# Patient Record
Sex: Male | Born: 1935 | Race: White | Hispanic: No | Marital: Married | Smoking: Never smoker
Health system: Southern US, Community
[De-identification: ages and names within clinical notes are randomized; demographics above are authoritative.]

## PROBLEM LIST (undated history)

## (undated) DIAGNOSIS — F1010 Alcohol abuse, uncomplicated: Secondary | ICD-10-CM

## (undated) DIAGNOSIS — I2510 Atherosclerotic heart disease of native coronary artery without angina pectoris: Secondary | ICD-10-CM

## (undated) DIAGNOSIS — I429 Cardiomyopathy, unspecified: Secondary | ICD-10-CM

## (undated) DIAGNOSIS — E119 Type 2 diabetes mellitus without complications: Secondary | ICD-10-CM

## (undated) DIAGNOSIS — R569 Unspecified convulsions: Secondary | ICD-10-CM

## (undated) DIAGNOSIS — I509 Heart failure, unspecified: Secondary | ICD-10-CM

## (undated) DIAGNOSIS — K5900 Constipation, unspecified: Secondary | ICD-10-CM

## (undated) DIAGNOSIS — R601 Generalized edema: Principal | ICD-10-CM

## (undated) DIAGNOSIS — E785 Hyperlipidemia, unspecified: Secondary | ICD-10-CM

## (undated) DIAGNOSIS — T4145XA Adverse effect of unspecified anesthetic, initial encounter: Secondary | ICD-10-CM

## (undated) DIAGNOSIS — R32 Unspecified urinary incontinence: Secondary | ICD-10-CM

## (undated) DIAGNOSIS — G629 Polyneuropathy, unspecified: Secondary | ICD-10-CM

## (undated) DIAGNOSIS — T8859XA Other complications of anesthesia, initial encounter: Secondary | ICD-10-CM

## (undated) DIAGNOSIS — C801 Malignant (primary) neoplasm, unspecified: Secondary | ICD-10-CM

## (undated) DIAGNOSIS — Z8719 Personal history of other diseases of the digestive system: Secondary | ICD-10-CM

## (undated) DIAGNOSIS — I10 Essential (primary) hypertension: Secondary | ICD-10-CM

## (undated) HISTORY — PX: CORONARY STENT PLACEMENT: SHX1402

## (undated) HISTORY — PX: TONSILLECTOMY: SUR1361

## (undated) HISTORY — PX: CATARACT EXTRACTION, BILATERAL: SHX1313

## (undated) HISTORY — PX: CORONARY ANGIOPLASTY: SHX604

## (undated) HISTORY — PX: RADIOACTIVE SEED IMPLANT: SHX5150

---
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
## 2003-11-21 ENCOUNTER — Ambulatory Visit (HOSPITAL_COMMUNITY): Admission: RE | Admit: 2003-11-21 | Discharge: 2003-11-21 | Payer: Self-pay | Admitting: *Deleted

## 2003-11-23 ENCOUNTER — Encounter: Payer: Self-pay | Admitting: Cardiology

## 2003-11-23 ENCOUNTER — Inpatient Hospital Stay (HOSPITAL_COMMUNITY): Admission: EM | Admit: 2003-11-23 | Discharge: 2003-11-30 | Payer: Self-pay | Admitting: Emergency Medicine

## 2003-11-30 ENCOUNTER — Ambulatory Visit: Payer: Self-pay | Admitting: Physical Medicine & Rehabilitation

## 2004-01-29 ENCOUNTER — Inpatient Hospital Stay (HOSPITAL_COMMUNITY): Admission: EM | Admit: 2004-01-29 | Discharge: 2004-02-08 | Payer: Self-pay | Admitting: Emergency Medicine

## 2004-01-29 ENCOUNTER — Ambulatory Visit: Payer: Self-pay | Admitting: Critical Care Medicine

## 2004-09-11 ENCOUNTER — Encounter: Admission: RE | Admit: 2004-09-11 | Discharge: 2004-09-11 | Payer: Self-pay | Admitting: Internal Medicine

## 2004-09-15 ENCOUNTER — Emergency Department (HOSPITAL_COMMUNITY): Admission: EM | Admit: 2004-09-15 | Discharge: 2004-09-15 | Payer: Self-pay | Admitting: Emergency Medicine

## 2005-10-20 ENCOUNTER — Ambulatory Visit: Admission: RE | Admit: 2005-10-20 | Discharge: 2005-11-11 | Payer: Self-pay | Admitting: Radiation Oncology

## 2006-03-01 ENCOUNTER — Ambulatory Visit: Admission: RE | Admit: 2006-03-01 | Discharge: 2006-05-16 | Payer: Self-pay | Admitting: Radiation Oncology

## 2006-03-22 ENCOUNTER — Encounter: Admission: RE | Admit: 2006-03-22 | Discharge: 2006-03-22 | Payer: Self-pay | Admitting: Urology

## 2006-03-23 HISTORY — PX: PROSTATE SURGERY: SHX751

## 2006-03-29 ENCOUNTER — Ambulatory Visit (HOSPITAL_BASED_OUTPATIENT_CLINIC_OR_DEPARTMENT_OTHER): Admission: RE | Admit: 2006-03-29 | Discharge: 2006-03-29 | Payer: Self-pay | Admitting: Urology

## 2006-03-29 ENCOUNTER — Encounter (INDEPENDENT_AMBULATORY_CARE_PROVIDER_SITE_OTHER): Payer: Self-pay | Admitting: *Deleted

## 2006-08-10 ENCOUNTER — Encounter: Admission: RE | Admit: 2006-08-10 | Discharge: 2006-08-10 | Payer: Self-pay | Admitting: Internal Medicine

## 2007-08-31 ENCOUNTER — Emergency Department (HOSPITAL_COMMUNITY): Admission: EM | Admit: 2007-08-31 | Discharge: 2007-08-31 | Payer: Self-pay | Admitting: Emergency Medicine

## 2007-09-13 ENCOUNTER — Emergency Department (HOSPITAL_BASED_OUTPATIENT_CLINIC_OR_DEPARTMENT_OTHER): Admission: EM | Admit: 2007-09-13 | Discharge: 2007-09-13 | Payer: Self-pay | Admitting: Emergency Medicine

## 2007-09-16 ENCOUNTER — Emergency Department (HOSPITAL_BASED_OUTPATIENT_CLINIC_OR_DEPARTMENT_OTHER): Admission: EM | Admit: 2007-09-16 | Discharge: 2007-09-16 | Payer: Self-pay | Admitting: Emergency Medicine

## 2009-08-26 ENCOUNTER — Inpatient Hospital Stay (HOSPITAL_COMMUNITY): Admission: RE | Admit: 2009-08-26 | Discharge: 2009-08-28 | Payer: Self-pay | Admitting: Ophthalmology

## 2009-09-02 ENCOUNTER — Ambulatory Visit (HOSPITAL_COMMUNITY): Admission: RE | Admit: 2009-09-02 | Discharge: 2009-09-03 | Payer: Self-pay | Admitting: Ophthalmology

## 2010-02-21 ENCOUNTER — Emergency Department (HOSPITAL_BASED_OUTPATIENT_CLINIC_OR_DEPARTMENT_OTHER): Admission: EM | Admit: 2010-02-21 | Discharge: 2010-02-21 | Payer: Self-pay | Admitting: Emergency Medicine

## 2010-03-01 ENCOUNTER — Emergency Department (HOSPITAL_BASED_OUTPATIENT_CLINIC_OR_DEPARTMENT_OTHER)
Admission: EM | Admit: 2010-03-01 | Discharge: 2010-03-01 | Payer: Self-pay | Source: Home / Self Care | Admitting: Emergency Medicine

## 2010-04-05 ENCOUNTER — Emergency Department (HOSPITAL_BASED_OUTPATIENT_CLINIC_OR_DEPARTMENT_OTHER)
Admission: EM | Admit: 2010-04-05 | Discharge: 2010-04-05 | Payer: Self-pay | Source: Home / Self Care | Admitting: Emergency Medicine

## 2010-04-07 LAB — URINE MICROSCOPIC-ADD ON

## 2010-04-07 LAB — URINALYSIS, ROUTINE W REFLEX MICROSCOPIC
Ketones, ur: 15 mg/dL — AB
Nitrite: POSITIVE — AB
Protein, ur: 100 mg/dL — AB
Specific Gravity, Urine: 1.012 (ref 1.005–1.030)
Urine Glucose, Fasting: NEGATIVE mg/dL
Urobilinogen, UA: 1 mg/dL (ref 0.0–1.0)
pH: 6.5 (ref 5.0–8.0)

## 2010-04-17 LAB — URINE CULTURE
Colony Count: >=100000
Culture  Setup Time: 201201150049

## 2010-06-03 LAB — URINE CULTURE
Colony Count: NO GROWTH
Culture  Setup Time: 201112030321
Culture  Setup Time: 201112102041
Culture: NO GROWTH

## 2010-06-03 LAB — URINE MICROSCOPIC-ADD ON

## 2010-06-03 LAB — URINALYSIS, ROUTINE W REFLEX MICROSCOPIC
Glucose, UA: NEGATIVE mg/dL
Glucose, UA: NEGATIVE mg/dL
Ketones, ur: 15 mg/dL — AB
Ketones, ur: NEGATIVE mg/dL
Protein, ur: 100 mg/dL — AB
Protein, ur: 30 mg/dL — AB
Specific Gravity, Urine: 1.012 (ref 1.005–1.030)
Urobilinogen, UA: 0.2 mg/dL (ref 0.0–1.0)
pH: 5.5 (ref 5.0–8.0)
pH: 5.5 (ref 5.0–8.0)

## 2010-06-09 LAB — COMPREHENSIVE METABOLIC PANEL
AST: 52 U/L — ABNORMAL HIGH (ref 0–37)
Albumin: 3 g/dL — ABNORMAL LOW (ref 3.5–5.2)
BUN: 19 mg/dL (ref 6–23)
BUN: 20 mg/dL (ref 6–23)
CO2: 23 mEq/L (ref 19–32)
Calcium: 9 mg/dL (ref 8.4–10.5)
Creatinine, Ser: 1.1 mg/dL (ref 0.4–1.5)
Creatinine, Ser: 1.14 mg/dL (ref 0.4–1.5)
GFR calc Af Amer: >60 mL/min (ref >60)
GFR calc non Af Amer: >60 mL/min (ref >60)
Glucose, Bld: 149 mg/dL — ABNORMAL HIGH (ref 70–99)
Glucose, Bld: 181 mg/dL — ABNORMAL HIGH (ref 70–99)
Total Bilirubin: 1.3 mg/dL — ABNORMAL HIGH (ref 0.3–1.2)
Total Protein: 6.5 g/dL (ref 6.0–8.3)

## 2010-06-09 LAB — CBC
HCT: 34.3 % — ABNORMAL LOW (ref 39.0–52.0)
HCT: 37.8 % — ABNORMAL LOW (ref 39.0–52.0)
HCT: 40 % (ref 39.0–52.0)
Hemoglobin: 12.2 g/dL — ABNORMAL LOW (ref 13.0–17.0)
Hemoglobin: 13.9 g/dL (ref 13.0–17.0)
MCHC: 34.7 g/dL (ref 30.0–36.0)
MCHC: 35.5 g/dL (ref 30.0–36.0)
MCV: 101.6 fL — ABNORMAL HIGH (ref 78.0–100.0)
MCV: 101.8 fL — ABNORMAL HIGH (ref 78.0–100.0)
Platelets: 126 10*3/uL — ABNORMAL LOW (ref 150–400)
Platelets: 155 10*3/uL (ref 150–400)
RBC: 3.72 MIL/uL — ABNORMAL LOW (ref 4.22–5.81)
RBC: 3.91 MIL/uL — ABNORMAL LOW (ref 4.22–5.81)
RDW: 12.5 % (ref 11.5–15.5)
WBC: 9.8 10*3/uL (ref 4.0–10.5)

## 2010-06-09 LAB — HEPATIC FUNCTION PANEL
ALT: 40 U/L (ref 0–53)
ALT: 43 U/L (ref 0–53)
AST: 49 U/L — ABNORMAL HIGH (ref 0–37)
AST: 63 U/L — ABNORMAL HIGH (ref 0–37)
Albumin: 3.5 g/dL (ref 3.5–5.2)
Alkaline Phosphatase: 77 U/L (ref 39–117)
Bilirubin, Direct: 0.3 mg/dL (ref 0.0–0.3)
Indirect Bilirubin: 0.8 mg/dL (ref 0.3–0.9)
Total Bilirubin: 1 mg/dL (ref 0.3–1.2)
Total Protein: 7.6 g/dL (ref 6.0–8.3)

## 2010-06-09 LAB — GLUCOSE, CAPILLARY
Glucose-Capillary: 122 mg/dL — ABNORMAL HIGH (ref 70–99)
Glucose-Capillary: 152 mg/dL — ABNORMAL HIGH (ref 70–99)
Glucose-Capillary: 156 mg/dL — ABNORMAL HIGH (ref 70–99)
Glucose-Capillary: 161 mg/dL — ABNORMAL HIGH (ref 70–99)
Glucose-Capillary: 162 mg/dL — ABNORMAL HIGH (ref 70–99)
Glucose-Capillary: 226 mg/dL — ABNORMAL HIGH (ref 70–99)
Glucose-Capillary: 285 mg/dL — ABNORMAL HIGH (ref 70–99)
Glucose-Capillary: 304 mg/dL — ABNORMAL HIGH (ref 70–99)

## 2010-06-09 LAB — BASIC METABOLIC PANEL
BUN: 16 mg/dL (ref 6–23)
BUN: 19 mg/dL (ref 6–23)
CO2: 26 mEq/L (ref 19–32)
Calcium: 9.4 mg/dL (ref 8.4–10.5)
Calcium: 9.8 mg/dL (ref 8.4–10.5)
Chloride: 103 mEq/L (ref 96–112)
Creatinine, Ser: 1.09 mg/dL (ref 0.4–1.5)
Creatinine, Ser: 1.28 mg/dL (ref 0.4–1.5)
GFR calc Af Amer: >60 mL/min (ref >60)
GFR calc Af Amer: >60 mL/min (ref >60)
GFR calc non Af Amer: 55 mL/min — ABNORMAL LOW (ref >60)
GFR calc non Af Amer: >60 mL/min (ref >60)
Glucose, Bld: 167 mg/dL — ABNORMAL HIGH (ref 70–99)
Potassium: 4.6 mEq/L (ref 3.5–5.1)
Sodium: 135 mEq/L (ref 135–145)

## 2010-06-09 LAB — DIFFERENTIAL
Basophils Absolute: 0 10*3/uL (ref 0.0–0.1)
Eosinophils Relative: 11 % — ABNORMAL HIGH (ref 0–5)
Lymphocytes Relative: 27 % (ref 12–46)
Lymphs Abs: 2.6 10*3/uL (ref 0.7–4.0)
Monocytes Absolute: 1.3 10*3/uL — ABNORMAL HIGH (ref 0.1–1.0)
Monocytes Relative: 14 % — ABNORMAL HIGH (ref 3–12)

## 2010-06-09 LAB — CARDIAC PANEL(CRET KIN+CKTOT+MB+TROPI)
Relative Index: INVALID (ref 0.0–2.5)
Troponin I: 0.06 ng/mL (ref 0.00–0.06)

## 2010-06-09 LAB — MAGNESIUM: Magnesium: 1.6 mg/dL (ref 1.5–2.5)

## 2010-06-09 LAB — PHOSPHORUS: Phosphorus: 3.4 mg/dL (ref 2.3–4.6)

## 2010-06-26 ENCOUNTER — Other Ambulatory Visit: Payer: Self-pay | Admitting: Dermatology

## 2010-08-08 NOTE — H&P (Signed)
NAME:  Daniel [name], Daniel [name]                        ACCOUNT NO.:  [id number]   MEDICAL RECORD NO.:  [id number]                   PATIENT TYPE:  INP   LOCATION:  0101                                 FACILITY:  [facility]   PHYSICIAN:  Mobolaji B. Maia Petties, M.D.            DATE OF BIRTH:  Feb 25, 1934   DATE OF ADMISSION:  11/23/2003  DATE OF DISCHARGE:                                HISTORY & PHYSICAL   PRIMARY CARE PHYSICIAN:  Dr. Tommy Medal.   CHIEF COMPLAINT:  Unsteady on his feet yesterday, fall with bruise over  right side of the face.   HISTORY OF THE PRESENTING COMPLAINT:  Mr. Godman is a 77 year old white male  with history of CAD, status post stents in 2004; history of diabetes,  hypertension, hypercholesterolemia.  He has significant history of alcohol  abuse.  He is now presenting with unsteady on his feet, which he noticed  yesterday afternoon/evening.  The patient went out to have supper at a  restaurant and in the car park, he became very tremulous and unsteady; he  subsequently fell and sustained injury to the right side of his face.  He  was taken to quick care and there he had a CT scan of the brain which did  not show any fracture or any intracranial bleed.  He did not lose  consciousness.  He went home and became more unsteady and tremulous and wife  was uncomfortable having him at home, hence he was brought back tonight to  [facility] for further evaluation.  A repeat head CT scan was done, just to  rule out any late bleed, and there was no intracranial abnormality, except  for atrophy.  The patient was unable to walk because he was extremely  unsteady.   On further questioning, Mr. Ralston drinks about 1 bottle of vodka in 2 days  at home,_________ drink more, however, in addition to this, he goes to a  nearby bar to drink.  Yesterday, he did not go to the bar to drink and there  was insufficient vodka at home, so he drank less than what he usually does  drink.  He denies  any chest pain, diaphoresis or palpitations.  He does have  nausea and vomiting; he vomited 3 times yesterday.  No hematemesis, no  melenic stool and no abdominal pain, no diarrhea.   REVIEW OF SYSTEMS:  No fever.  No headaches.  Pain on the right side of the  face.  No urinary symptoms.   PAST MEDICAL HISTORY:  1.  CAD, status post 2 stent placements in October 2004.  2.  Diabetes mellitus, type 2.  3.  Hypertension.  4.  Hypercholesterolemia.  5.  Grand mal seizure which required ICU admission and possible intubation      at [facility]; these were secondary to alcohol withdrawal.  6.  Patient had syncopal episode about 6 weeks ago.  His blood glucose was  at that time 26.  He did not come to the emergency department, however,      EMS attended to him at home and they were able to bring up his blood      glucose to 260, and this was deemed to be due to poor p.o. intake.  7.  Depression.   MEDICATIONS:  1.  Cephalexin 500 mg p.o. t.i.d.; this was prescribed yesterday for the      bruise and abrasion on the right side of the face.  2.  Lipitor 40 mg p.o. daily.  3.  Lexapro 10 mg p.o. daily.  4.  Plavix 75 mg p.o. daily.  5.  Amaryl 2 mg p.o. daily.  6.  Toprol-XL 100 mg p.o. daily.  7.  Avapro 300 mg p.o. daily.   The patient's wife says that the patient does not use all these medications;  he is only using Plavix and Amaryl.   FAMILY HISTORY:  He is married and lives with his wife.  One sister died of  lung cancer in 06-24-2001; another sister died of pancreatic cancer in 24-Jun-2001.  No  family history of asthma.   SOCIAL HISTORY:  The patient does not smoke, however, he drinks heavily,  vodka, 1 bottle in 2 days.  He does not use any illicit drugs.  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  He lives a sedentary lifestyle.   PHYSICAL EXAMINATION:  VITALS IN THE EMERGENCY DEPARTMENT:  Temperature  98.1, blood pressure  188/91, pulse rate 101, respiratory rate 22, O2  saturations 98%.  GENERAL:  On examination, he is awake, alert, oriented in time, place and  person, not in any obvious respiratory distress.  HEENT:  He had bandage over an abrasion on the right supraorbital region  with periorbital hematoma and facial swelling on the right side and bruise.  Mucous membranes moist.  No oral thrush.  Extraocular muscle movements  intact.  No nystagmus.  No ocular paralysis.  NECK:  No neck stiffness, no cervical lymphadenopathy, no thyromegaly.  LUNGS:  Lungs clear clinically to auscultation.  CVS:  S1 and S2 regular.  No gallop.  No murmur.  No rub.  No holosystolic  murmur.  ABDOMEN:  Abdomen obese, soft, nontender.  No palpable organomegaly.  Bowel  sounds present.  Shifting dullness negative.  MUSCULOSKELETAL:  Facial injury as described above, also a bruise and an  abrasion on the right hand and on the right knee.  He is able to move all  limbs.  CNS:  Cranial nerves intact.  No focal neurological deficit.  Plantar  reflex:  Flex on the right and withdrawal on the left.   INITIAL LABORATORY DATA:  White cell count 7.9, hemoglobin 14.5, hematocrit  41.1, platelets 122,000; neutrophils 76%, lymphocytes 10%, monocytes 14%.  Sodium 137, potassium 3.3, chloride 102, bicarb 26, glucose 151, BUN 8,  creatinine 0.7, calcium 8.7.   CT scan of the head:  No intracranial bleed.  Atrophy.  No evidence of  fracture.   ASSESSMENT AND PLAN:  Mr. Easterly is a 77 year old with history of coronary  artery disease and multiple other medical problems, significant history of  alcohol abuse, presenting with ataxia and fall probably secondary to  drinking less than what he usually drinks.   1.  Alcohol withdrawal:  We will check hemoglobin and hematocrit level,      vitamin B12 and folate level, amylase, lipase, magnesium phosphate.  We     will start on alcohol withdrawal protocol with Ativan, seizure       precautions.  We will obtain social services to help with quitting      alcohol and I discussed with the patient in the presence of wife and      son; the patient will think about it.  Wife did say that he had been      advised regarding quitting, however, the patient has refused to so far.      Thiamine 100 mg intravenously/p.o. daily, folic acid 4 mg      intravenously/p.o. daily, intravenous fluid -- D-5 normal saline at 125      mL/hr plus 30 mEq of potassium chloride.  2.  Thrombocytopenia, probably alcohol related, query Plavix related.  No      bleeding at this point.  3.  Coronary artery disease:  This appears to be stable.  The patient has no      complaint.  We will check EKG.  We will continue Plavix 75 mg p.o.      daily, Toprol-XL 100 mg p.o. daily.  4.  Hypokalemia:  We will replete potassium.  5.  Type 2 diabetes mellitus:  Check hemoglobin A1c.  We will hold Amaryl      until the patient resumes adequate oral intake.  Sliding-scale insulin      coverage moderately sensitive for now.  58.  Fall with trauma to face:  No fracture.  We will continue with      ciprofloxacin eye drops to both eyes every 4 hours and Cephalexin 500 mg      t.i.d. p.o.; these were prescribed at the urgent care center.  We will      give morphine 4 mg intravenously q.4 h. p.r.n. for pain for moderate-to-      severe pain and Tylenol 650 mg p.o. q.6 h. p.r.n. for mild pain.  7.  Hypertension:  We will resume Toprol-XL 100 mg p.o. daily and Avapro 300      mg p.o. daily.  8.  Depression:  Lexapro 10 mg p.o. daily.  9.  Hyperlipidemia:  We will check hepatic profile and fasting lipid      profile.  Lipitor 40 mg p.o. daily.  10. We will use TED stockings for deep venous thrombosis prophylaxis.                                               Mobolaji B. Maia Petties, M.D.    MBB/MEDQ  D:  11/23/2003  T:  11/23/2003  Job:  [id number]   cc:   Tommy Medal, M.D.  [address]  [place], [place]  [address]  Fax: [phone]

## 2010-08-08 NOTE — Discharge Summary (Signed)
NAMERAMEEZ, Daniel [name]              ACCOUNT NO.:  [id number]   MEDICAL RECORD NO.:  [id number]          PATIENT TYPE:  INP   LOCATION:  [id number]                         FACILITY:  [facility]   PHYSICIAN:  Jonna L. Gwynneth Aliment, M.D.DATE OF BIRTH:  [birth year]   DATE OF ADMISSION:  11/23/2003  DATE OF DISCHARGE:  11/30/2003                                 DISCHARGE SUMMARY   PRIMARY CARE PHYSICIAN:  Tommy Medal, M.D.   FINAL DIAGNOSES:  1.  Alcohol withdrawal, delirium tremens.  2.  Multiple hematoma from fall.  3.  Alcohol induced thrombocytopenia.  4.  Hypokalemia.  5.  Congestive heart failure.  6.  Coronary artery disease.  7.  Hyperlipidemia.  8.  Type 2 diabetes.  9.  Hypertension.  10. Deconditioning.   HISTORY:  This 77 year old white male with coronary artery disease and  multiple risk factors had a significant history of alcohol abuse. The  patient developed ataxia, tremulousness, fell outside and got multiple  hematomas to the right side of the face.  He was checked out at [facility],  CT scan did not show fracture or bleed so he was treated for the facial  abrasions and went home. At home, he became more unsteady and tremulous and  was brought back. The patient drinks at least a bottle of vodka a day.   PAST MEDICAL HISTORY:  Notable for coronary artery disease, type 2 diabetes,  hypertension, hypercholesterolemia, previous history of grand mal seizures,  ICU admission, intubation secondary to alcohol withdrawal. He had a  hypoglycemic episode six weeks ago.   PHYSICAL EXAMINATION ON ADMISSION:  Notable for blood pressure of 188/91,  pulse of 101.  He had periorbital hematoma and facial swelling on the right  supraorbital area.  No neck stiffness.  No hepatosplenomegaly.   Platelet count was 122, potassium 3.3, glucose 151.  CT of the head showed  atrophy without bleed or fracture.   HOSPITAL COURSE:  For his alcohol withdrawal, he was put Ativan protocol,  seizure  precautions, thiamin, folic acid, potassium replacement. He was  continued on his Plavix and Toprol.  Amaryl was held until the patient  started eating.  EKG showed a left anterior hemiblock.   The patient did well on protocol.  He was observed for DTs and seizures, but  did not have problems with either.  He was found to have a nonischemic  cardiomyopathy. He was restarted on Lexapro for depression.  The patient was  so weak that he was really unable to walk without assistance so he got some  physical therapy. He was not a candidate for subacute care but we looked  into rehabilitation and alcohol rehab. The patient, however, refused both  alcohol rehab services and outpatient physical therapy.   DISPOSITION:  The patient was discharged on Amaryl 2 mg q.d., Avapro 300  q.d., Toprol XL 100 q.d., Plavix 75 q.d., Lexapro 20 q.d., Lipitor 40 q.d.,  centrum, Cipro ophthalmic drops 2 drops in each eye for 3 more days.  Use  Tylenol as needed for pain. Has an appointment to see Dr. Minna Antis on September  20.      JLB/MEDQ  D:  12/20/2003  T:  12/21/2003  Job:  [id number]   cc:   Tommy Medal, M.D.  [address]  [place], [place] [address]  Fax: [phone]

## 2010-08-08 NOTE — Op Note (Signed)
NAMEDMARIO, GANGWER              ACCOUNT NO.:  [id number]   MEDICAL RECORD NO.:  [id number]          PATIENT TYPE:  AMB   LOCATION:  NESC                         FACILITY:  [facility]   PHYSICIAN:  Domingo Pulse, M.D.  DATE OF BIRTH:  March 05, 1934   DATE OF PROCEDURE:  03/29/2006  DATE OF DISCHARGE:                               OPERATIVE REPORT   PREOPERATIVE DIAGNOSIS:  Carcinoma of the prostate.   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   OPERATION PERFORMED:  1. Radioactive seed implantation.  2. Cystoscopy, bladder biopsy with fulguration.   SURGEON:  Domingo Pulse, M.D.   ANESTHESIA:  General.   COMPLICATIONS:  None.   DRAINS:  Foley catheter.   INDICATIONS FOR PROCEDURE:  This 77 year old male is known to have  carcinoma of the prostate.  He has been given appropriate preoperative  evaluation as well as careful discussion of the options available for  treatment.  Options that we discussed include watchful waiting, external  beam radiation therapy, radiation seed implantation, surgery and  cryosurgery.  He has elected to undergo radiation seed implantation.  He  understands the risks and benefits of the procedure, gave full informed  consent for the procedure.   After successful induction of general anesthesia, the patient was placed  in the dorsal lithotomy position and prepped with Betadine and draped in  the usual sterile fashion.  Patient had the ultrasound probe within the  rectum and was positioned by radiation oncology so that he was in  identical position as that obtained in his preoperative planning  sessions.  The patient had a total of 25 needles inserted.  The total  number of seeds was 70.  The total dose delivered was 145 cGy.  These  were I-125 seeds with intraoperative preplanning.  The patient's post  implant image looked good with excellent coverage.  Cystoscopy was then  performed.  Urethra was  visualized in its entirety and found to be  normal.  Beyond the verumontanum there was minimal prostatic  enlargement.  The bladder itself was unremarkable, no tumors or stones  could be seen.  There was one inflammatory-looking lesion.  It appeared  vascular, but because it was irregular in its appearance, we recommended  a biopsy.  A biopsy was performed with a  cold cup biopsy forceps.  The biopsy site was cauterized.  The bladder  was drained, the Foley catheter was inserted.  The patient tolerated the  procedure well and was taken to the recovery room in good condition.  He  will be sent home on Lorcet Plus and Septra and take his Foley catheter  out tomorrow.           ______________________________  Domingo Pulse, M.D.  Electronically Signed     RJE/MEDQ  D:  03/29/2006  T:  03/29/2006  Job:  [id number]   cc:   Rexene Edison, M.D.  Fax: [phone]   Tommy Medal, M.D.  Fax: [place] Little, M.D.  Fax: [phone]

## 2010-08-08 NOTE — H&P (Signed)
[name], Daniel NO.:  [id number]   MEDICAL RECORD NO.:  [id number]          PATIENT TYPE:  INP   LOCATION:  0104                         FACILITY:  [facility]   PHYSICIAN:  Jacquelynn Cree, M.D.   DATE OF BIRTH:  March 10, 1934   DATE OF ADMISSION:  01/29/2004  DATE OF DISCHARGE:                                HISTORY & PHYSICAL   PRIMARY CARE PHYSICIAN:  Dr. Tommy Medal.   CHIEF COMPLAINT:  Weakness.   HISTORY OF PRESENT ILLNESS:  The patient is a 77 year old male with a past  medical history of alcoholism and hospitalization with delirium tremens in  September of 2005, congestive heart failure and coronary artery disease, as  well as type 2 diabetes, who presents with a 24-hour history of weakness and  a syncopal event.  The patient reports that he was in his usual state of  health until 4 days prior to admission, when he presented to his primary  care physician for complaints of nausea and vomiting.  He was treated  symptomatically with antiemetics and did well with resolution of the nausea  and vomiting, however, over the next few days, he experienced labile blood  glucoses with sugars as low as into the 40s.  He reports that when he saw  his physician, there was a question of whether he might have a urinary tract  infection secondary to an elevation of his prostate specific antigen and was  put on a sulfa-based antibiotic at that time.  The patient denied any  dysuria, frequency or other symptoms of urinary tract infection.  The  patient also reports that he found himself on the floor with brief loss of  consciousness after defecating today at about 2:30 p.m.  With regard to his  alcohol abuse, he denies any alcohol intake for the past 7-8 days.  Denies  any loss of bladder or bowel function, focal neurologic deficits, diplopia,  dysphagia, or loss of power.   PAST MEDICAL HISTORY:  1.  Longstanding alcoholism with alcohol withdrawal seizures, status post     hospitalization, September 2005, for delirium tremens and history of      intubation secondary to withdrawal in the past as well.  2.  Alcohol-induced thrombocytopenia.  3.  Coronary artery disease/CHF with stents placed in 2004.  4.  Hyperlipidemia.  5.  Type 2 diabetes, last hemoglobin A1c 5.7 in September of 2005.  6.  Hypertension.  7.  History of nonischemic cardiomyopathy, last 2-D echocardiogram done,      September of 2005, showing a decreased LV function with an ejection      fraction of 35% to 45% and global hypokinesis.  8.  Depression.   FAMILY HISTORY:  Mother is deceased at age 49 secondary to CHF.  Father is  deceased at age 12 secondary to CHF and complications of emphysema.  He has  1 sister deceased from pancreatic cancer and another sister deceased from  lung cancer.  He has 2 son that are healthy.   SOCIAL HISTORY:  He is married.  Up until 1 week ago, he drank 1 bottle  of  vodka every 1-2 days.  He is a life-long nonsmoker.  Denies any drug abuse.  He was previously employed at [employer], but has now retired.   DRUG ALLERGIES:  No known drug allergies.   MEDICATIONS:  1.  Bactrim DS 1 p.o. b.i.d.  2.  Phenergan 25 mg p.r.n.  3.  Lexapro 10 mg daily.  4.  Amaryl 2 mg daily.  5.  Avapro 300 mg daily.  6.  Toprol-XL 100 mg daily.  7.  Lipitor 40 mg daily.  8.  Aspirin daily.   REVIEW OF SYSTEMS:  Several days of diaphoresis with fever and chills.  He  reports approximately a 5-pound weight loss over the past few weeks.  Pertinent negatives include no nausea, vomiting, diarrhea for the last few  days.  No melena or hematochezia.  No dysuria, frequency, or flank pain.  He  has had some increased thirst.  No headaches, except for at his baseline.  No neck stiffness.  No new onset of peripheral edema.  All other systems  reviewed and found to be negative.   PHYSICAL EXAMINATION:  VITAL SIGNS:  Temperature 98.5, pulse 77,  respirations 20, blood  pressure 105/61, O2 saturation 97% on room air.  GENERAL:  Well-developed, well-nourished male in no apparent distress.  NECK:  Supple, no thyromegaly, no lymphadenopathy, no jugular venous  distention.  CHEST:  Lungs diminished at the bases, but otherwise clear.  HEART:  Regular rate, rhythm.  No murmurs, rubs, or gallops.  ABDOMEN:  Soft, nontender, nondistended, with normoactive bowel sounds.  EXTREMITIES:  No clubbing, edema, or cyanosis.  SKIN:  Warm and dry with no rash.  NEUROLOGIC:  The patient is alert and oriented x3.  Pupils are equal, round,  reactive to light and accommodation.  Extraocular movements are intact.  Tongue is midline.  No facial weakness.  Shoulder shrug is equal  bilaterally.  The patient moves all extremities x4 with 5/5 strength, but  does have some generalized weakness.  He is unable to ambulate secondary to  generalized weakness, although no weakness is noted on exam.  Reflexes are  2+ and symmetric throughout.   LABORATORY AND RADIOGRAPH DATA:  EKG showed normal sinus rhythm with left  axis deviation and a prolonged Q-T interval of 488 msec.   Chest x-ray showed cardiomegaly, but no edema.  There was some left lower  lobe atelectasis.   CT of the head showed no acute changes.  There was some atrophy and chronic  microvascular white matter disease noted.   Chemistries showed a sodium of 125, potassium 4.9, chloride 93, bicarb 25,  BUN 47, creatinine 2.1, glucose 338.  Calcium was 8.7, total protein 5.8,  albumin 2.4, AST 52, ALT 51, alkaline phosphatase 107, total bilirubin 3.1.  Point-of-care markers were negative x1.  CBC showed a white blood count of  9.6, hemoglobin 12.4, hematocrit 35.1, and platelet count of 142,000.  Absolute neutrophil count was 8.7.   ASSESSMENT AND PLAN:  1.  Syncope:  Broad differential for this.  This may have been vasovagal,      induced by some underlying dehydration secondary to his recent nausea     and vomiting  along with vagal stimulation during defecation.  I will      check orthostatics and hydrate him.  Additionally, he did have evidence      of a prolonged Q-T interval, so we will admit him to a telemetry bed and  monitor him for any evidence of arrhythmia.  He does have a history of      nonischemic cardiomyopathy which could predispose him towards arrhythmic      events.  We will rule out an acute coronary syndrome with serial enzymes      and EKG.  Additionally, given his alcohol abuse, we will check an      alcohol level and a urine drug screen.  He was also found to be      hyponatremic and this may have caused some mental status changes.  2.  Hyponatremia:  He may have some underlying intravascular depletion      secondary to congestive heart failure from cardiomyopathy.  We will      check a BNP to see if he has any evidence of overload.  Additionally,      Lexapro can cause hyponatremia, so we will hold this medicine.  We will      check urine osmolality and urine sodium as well.  3.  Questionable urinary tract infection:  I will check urinalysis, although      clinically the patient does not appear to have any evidence of urinary      tract infection.  4.  Labile glucoses in the setting of diabetes:  We will hold all of his      oral hypoglycemic medications and check his sugars before meals and at      bedtime, and cover him with Regular insulin with an insulin-sensitive      scale.  5.  Alcohol abuse:  The patient should be out of the window of time for      delirium tremens or withdrawal syndrome if it truly has been over a week      since his last drink.  I will order Ativan on as-needed basis for      agitation and supplement him with thiamine and folic acid.  He will also      be placed on seizure and withdrawal precautions.  6.  Thrombocytopenia:  This is likely secondary to bone marrow toxicity from      chronic alcohol abuse.  It is mild.  7.  Hyperlipidemia:  We will  continue the patient's statin while in house.  8.  Elevated creatinine:  Unclear of what his baseline creatinine is, but it      is likely elevated secondary to dehydration along with ongoing treatment      with an angiotensin receptor blocker.  I will hold all of his      hypertensive medications for now and hydrate him gently.  We will check      a creatinine in the morning and pursue further diagnostic testing if      indicated.      CR/MEDQ  D:  01/29/2004  T:  01/29/2004  Job:  [id number]   cc:   Tommy Medal, M.D.  [address]  [place], [place] [address]  Fax: [phone]

## 2010-08-08 NOTE — Discharge Summary (Signed)
Daniel [name], Daniel [name]              ACCOUNT NO.:  [id number]   MEDICAL RECORD NO.:  [id number]          PATIENT TYPE:  INP   LOCATION:  [place]                         FACILITY:  [facility]   PHYSICIAN:  Mobolaji B. Bakare, M.D.DATE OF BIRTH:  06-Jan-1934   DATE OF ADMISSION:  01/28/2004  DATE OF DISCHARGE:  02/07/2004                                 DISCHARGE SUMMARY   FINAL DIAGNOSES:  1.  Respiratory failure.  2.  Acute pulmonary edema.  3.  Nonischemic cardiomyopathy with global hypokinesia.  4.  Alcohol withdrawal.  5.  Hyponatremia.  6.  Ileus.  7.  Jaundice with abnormal liver enzymes.  8.  Constipation.  9.  Aspiration pneumonia.  10. Chronic obstructive pulmonary disease.  11. Malnutrition.  12. Bilateral pleural effusion.   SECONDARY DIAGNOSES:  1.  Type 2 diabetes mellitus.  2.  Hypercholesterolemia.  3.  Depression.  4.  Coronary artery disease.  5.  History of seizures secondary to alcohol abuse.   HISTORY OF PRESENT ILLNESS:  The patient presented to the emergency  department with complaints of weakness and was found to have acute mental  status changes and syncope.  He was initially admitted up onto the floor.  However, he deteriorated and was admitted to ICU for respiratory failure and  altered mental status changes.   PERTINENT PHYSICAL FINDINGS:  GENERAL APPEARANCE:  The patient appeared  overweight and was not in apparent distress.  VITAL SIGNS:  Admission temperature 98.5, pulse  77, respiratory rate of 20,  blood pressure 105/61, O2 saturation 97% on room air.  NEUROLOGIC:  Cranial nerves were intact, however, the patient was unable to  ambulate secondary to generalized weakness but no objective weakness.  Deep  tendon reflexes were within normal limits.   PERTINENT LABORATORY DATA:  Alcohol level was less than 5.  BNP 488.  Serum  osmolality 275.  Drug screen was positive for benzodiazepines.  D-dimer  8.22.  ABG showed pH of 7.37, pCO2 34 and pO2 47,  bicarb 20. Sodium 128,  potassium 3.5, chloride 100, CO2 20, glucose 153, BUN 34, creatinine 1.6 and  calcium 8.4.  Magnesium 2.1.  Cardiac enzymes were normal.  Hepatic profile  was abnormal with total bilirubin of 4.3, direct 2.5, indirect 1.8, alkaline  phosphatase 192, AST 49, ALT 50.  This subsequently improved and at the time  of discharge, the alkaline phosphatase was 229, AST 46, ALT 47, total  bilirubin 3.4.  Dilantin level checked on February 03, 2004, was 9.7.  CBC  by the time of discharge shows white cells 8.3, platelets 276, hematocrit  27, hemoglobin 9.4, MCV 96.  Prealbumin 6.3.  TSH 0.856.   Chest x-ray showed left lower lobe infiltrates.  Abdominal x-ray shows  distention of the colon consistent with ileus.  CT scan of the abdomen did  not show any mechanical obstruction.  The liver, spleen and pancreas were  with a normal appearance.  Common bile duct was not dilated.  There was  question of cholelithiasis or gallbladder sludge but no biliary duct  dilatation.  Bilateral pleural effusions which appear to  be small.   HOSPITAL COURSE:  PROBLEM #1 -  ACUTE RESPIRATORY FAILURE VENTILATION  DEPENDENT SECONDARY TO ACUTE PULMONARY EDEMA AND CARDIOMYOPATHY WITH  POSSIBLE ELEMENT OF ASPIRATION:  The patient was on ventilation support and  IV antibiotics.  He was intubated on January 29, 2004, and extubated on  January 24, 2004.  The patient did well for about 48 hours in ICU and it was  deemed fit to transfer to telemetry.  He continued to improve in his  oxygenation.  He was not in withdrawal.   PROBLEM #2 -  ILEUS:  The patient had an episode of ileus in intensive care  and subsequently he was started on tube feeding.  On arrival upon the floor,  he had an episode of vomiting.  X-ray of the abdomen showed gaseous  distention of the colon and NG tube was placed.  He subsequently improved,  moving his bowel and CT scan of the abdomen was done to evaluate for any  mechanical  obstruction but there was none and liver and common bile ducts  were within normal limits.   PROBLEM #3 -  CHRONIC OBSTRUCTIVE PULMONARY DISEASE:  The patient was  continued on bronchodilators.   PROBLEM #4 -  HISTORY OF SEIZURES:  He was continued on Dilantin.  He did  not have any seizure episode during this hospitalization.   PROBLEM #5 -  ALCOHOL ABUSE:  The patient is keen in quitting drinking and  he actually stopped drinking prior to this current hospitalization.  He was  seen by the care manager to set up outpatient behavioral therapy followup   PROBLEM #6 -  MALNUTRITION WITH VERY LOW ALBUMIN AND PREALBUMIN:  The  patient was seen by nutritional __________ and he was given nutritional  supplements.   PROBLEM #7 -  HYPERTENSION:  This was fairly controlled during this  hospitalization with Catapres patch while he was NPO and his medications  were started when he could resume p.o.   PROBLEM #8 -  ELECTROLYTES:  Potassium and magnesium were corrected as  necessary.   DISCHARGE MEDICATIONS:  1.  Aspirin 81 mg p.o. daily.  2.  Lexapro 10 mg p.o. daily.  3.  Folic acid 1 mg p.o. daily.  4.  Amaryl 1 mg p.o. daily a.c.  The patient's home medication was 2 mg of      Amaryl and this needs to be titrated as the patient improves with his      feeding.  At this time, his hyperglycemia is under control.  5.  Avapro 300 mg p.o. daily.  6.  Toprol XL __________ mg p.o. daily.  7.  Nutritional supplement with Resource one p.o. t.i.d.  8.  Dilantin 200 mg p.o. b.i.d.  9.  Thiamine 100 mg p.o. daily.  10. Lipitor is on hold secondary to abnormal liver enzymes.  11. Colace 100 mg b.i.d.  12. Dulcolax p.r.n. for constipation.  13. Combivent two puffs q.i.d.  14. Albuterol p.r.n.   At this point, the patient will need further rehabilitation to get him back  to his baseline.  This is an interim discharge summary.  The patient is likely going to a skilled nursing facility or subacute  care unit.   RECOMMENDATION:  Follow up with alcohol counseling as per care manager.      MBB/MEDQ  D:  02/07/2004  T:  02/07/2004  Job:  [id number]   cc:   Tommy Medal, M.D.  [address] [place],  [place] [address]  Fax: [phone]

## 2010-12-18 LAB — DIFFERENTIAL
Eosinophils Absolute: 0
Eosinophils Relative: 0
Lymphs Abs: 1
Monocytes Absolute: 0.9
Monocytes Relative: 12

## 2010-12-18 LAB — POCT I-STAT, CHEM 8
BUN: 20
Calcium, Ion: 1.11 — ABNORMAL LOW
Creatinine, Ser: 1.2
Glucose, Bld: 153 — ABNORMAL HIGH
Hemoglobin: 13.6
Sodium: 132 — ABNORMAL LOW
TCO2: 26

## 2010-12-18 LAB — CBC
HCT: 39
MCV: 98.4
Platelets: 99 — ABNORMAL LOW
WBC: 7.3

## 2013-03-02 ENCOUNTER — Ambulatory Visit (INDEPENDENT_AMBULATORY_CARE_PROVIDER_SITE_OTHER): Payer: Medicare Other | Admitting: Podiatry

## 2013-03-02 DIAGNOSIS — B351 Tinea unguium: Secondary | ICD-10-CM

## 2013-03-02 DIAGNOSIS — M79609 Pain in unspecified limb: Secondary | ICD-10-CM

## 2013-03-02 NOTE — Progress Notes (Signed)
   Subjective:    Patient ID: Daniel [name], male    DOB: 23-Jun-1933, 79 y.o.   MRN: [id number]  HPI Comments: '' TRIM TOENAILS and CALLUS TRIM''     Review of Systems     Objective:   Physical Exam: Pulses are nonpalpable bilateral however capillary fill time is immediate to digits one through 5 bilateral and feet are warm to the touch. Reactive hyperkeratosis plantar aspect first metatarsophalangeal joint bilateral do not demonstrate any type of ulceration at this point nails are thick yellow dystrophic clinically mycotic and painful palpation.          Assessment & Plan:  Assessment: Peripheral vascular disease with a history of the ulcerative lesion sub-first metatarsophalangeal joint. Pain in limb secondary to onychomycosis 1 through 5 bilateral.  Plan: Debridement of all reactive hyperkeratosis. No iatrogenic lesions today. Debridement of nails 1 through 5 bilateral.

## 2013-04-17 ENCOUNTER — Inpatient Hospital Stay (HOSPITAL_COMMUNITY)
Admission: EM | Admit: 2013-04-17 | Discharge: 2013-04-20 | DRG: 699 | Disposition: A | Discharge status: Home Health | Payer: Medicare Other | Attending: Internal Medicine | Admitting: Internal Medicine

## 2013-04-17 ENCOUNTER — Inpatient Hospital Stay (HOSPITAL_COMMUNITY): Payer: Medicare Other

## 2013-04-17 ENCOUNTER — Encounter (HOSPITAL_COMMUNITY): Payer: Self-pay | Admitting: Emergency Medicine

## 2013-04-17 DIAGNOSIS — Z7982 Long term (current) use of aspirin: Secondary | ICD-10-CM

## 2013-04-17 DIAGNOSIS — K7030 Alcoholic cirrhosis of liver without ascites: Secondary | ICD-10-CM | POA: Diagnosis present

## 2013-04-17 DIAGNOSIS — R310 Gross hematuria: Secondary | ICD-10-CM | POA: Diagnosis present

## 2013-04-17 DIAGNOSIS — E871 Hypo-osmolality and hyponatremia: Secondary | ICD-10-CM | POA: Diagnosis present

## 2013-04-17 DIAGNOSIS — R7402 Elevation of levels of lactic acid dehydrogenase (LDH): Secondary | ICD-10-CM | POA: Diagnosis present

## 2013-04-17 DIAGNOSIS — Z87440 Personal history of urinary (tract) infections: Secondary | ICD-10-CM

## 2013-04-17 DIAGNOSIS — R42 Dizziness and giddiness: Secondary | ICD-10-CM | POA: Diagnosis present

## 2013-04-17 DIAGNOSIS — B965 Pseudomonas (aeruginosa) (mallei) (pseudomallei) as the cause of diseases classified elsewhere: Secondary | ICD-10-CM | POA: Diagnosis present

## 2013-04-17 DIAGNOSIS — N189 Chronic kidney disease, unspecified: Secondary | ICD-10-CM | POA: Diagnosis present

## 2013-04-17 DIAGNOSIS — F1020 Alcohol dependence, uncomplicated: Secondary | ICD-10-CM

## 2013-04-17 DIAGNOSIS — R109 Unspecified abdominal pain: Secondary | ICD-10-CM | POA: Diagnosis present

## 2013-04-17 DIAGNOSIS — R32 Unspecified urinary incontinence: Secondary | ICD-10-CM | POA: Diagnosis present

## 2013-04-17 DIAGNOSIS — I129 Hypertensive chronic kidney disease with stage 1 through stage 4 chronic kidney disease, or unspecified chronic kidney disease: Secondary | ICD-10-CM | POA: Diagnosis present

## 2013-04-17 DIAGNOSIS — N200 Calculus of kidney: Secondary | ICD-10-CM | POA: Diagnosis present

## 2013-04-17 DIAGNOSIS — N179 Acute kidney failure, unspecified: Secondary | ICD-10-CM | POA: Diagnosis present

## 2013-04-17 DIAGNOSIS — N3040 Irradiation cystitis without hematuria: Principal | ICD-10-CM | POA: Diagnosis present

## 2013-04-17 DIAGNOSIS — A499 Bacterial infection, unspecified: Secondary | ICD-10-CM | POA: Diagnosis present

## 2013-04-17 DIAGNOSIS — E785 Hyperlipidemia, unspecified: Secondary | ICD-10-CM | POA: Diagnosis present

## 2013-04-17 DIAGNOSIS — R319 Hematuria, unspecified: Secondary | ICD-10-CM

## 2013-04-17 DIAGNOSIS — I359 Nonrheumatic aortic valve disorder, unspecified: Secondary | ICD-10-CM

## 2013-04-17 DIAGNOSIS — K449 Diaphragmatic hernia without obstruction or gangrene: Secondary | ICD-10-CM | POA: Diagnosis present

## 2013-04-17 DIAGNOSIS — R7401 Elevation of levels of liver transaminase levels: Secondary | ICD-10-CM

## 2013-04-17 DIAGNOSIS — K709 Alcoholic liver disease, unspecified: Secondary | ICD-10-CM | POA: Diagnosis present

## 2013-04-17 DIAGNOSIS — R55 Syncope and collapse: Secondary | ICD-10-CM

## 2013-04-17 DIAGNOSIS — I2510 Atherosclerotic heart disease of native coronary artery without angina pectoris: Secondary | ICD-10-CM | POA: Diagnosis present

## 2013-04-17 DIAGNOSIS — E860 Dehydration: Secondary | ICD-10-CM

## 2013-04-17 DIAGNOSIS — Z8546 Personal history of malignant neoplasm of prostate: Secondary | ICD-10-CM

## 2013-04-17 DIAGNOSIS — Z9861 Coronary angioplasty status: Secondary | ICD-10-CM

## 2013-04-17 DIAGNOSIS — N390 Urinary tract infection, site not specified: Secondary | ICD-10-CM

## 2013-04-17 DIAGNOSIS — R740 Nonspecific elevation of levels of transaminase and lactic acid dehydrogenase [LDH]: Secondary | ICD-10-CM

## 2013-04-17 DIAGNOSIS — N3090 Cystitis, unspecified without hematuria: Secondary | ICD-10-CM | POA: Diagnosis present

## 2013-04-17 DIAGNOSIS — E1129 Type 2 diabetes mellitus with other diabetic kidney complication: Secondary | ICD-10-CM

## 2013-04-17 DIAGNOSIS — B9689 Other specified bacterial agents as the cause of diseases classified elsewhere: Secondary | ICD-10-CM | POA: Diagnosis present

## 2013-04-17 DIAGNOSIS — E119 Type 2 diabetes mellitus without complications: Secondary | ICD-10-CM | POA: Diagnosis present

## 2013-04-17 HISTORY — DX: Other complications of anesthesia, initial encounter: T88.59XA

## 2013-04-17 HISTORY — DX: Essential (primary) hypertension: I10

## 2013-04-17 HISTORY — DX: Atherosclerotic heart disease of native coronary artery without angina pectoris: I25.10

## 2013-04-17 HISTORY — DX: Polyneuropathy, unspecified: G62.9

## 2013-04-17 HISTORY — DX: Adverse effect of unspecified anesthetic, initial encounter: T41.45XA

## 2013-04-17 HISTORY — DX: Unspecified urinary incontinence: R32

## 2013-04-17 HISTORY — DX: Type 2 diabetes mellitus without complications: E11.9

## 2013-04-17 HISTORY — DX: Personal history of other diseases of the digestive system: Z87.19

## 2013-04-17 HISTORY — DX: Cardiomyopathy, unspecified: I42.9

## 2013-04-17 HISTORY — DX: Hyperlipidemia, unspecified: E78.5

## 2013-04-17 HISTORY — DX: Heart failure, unspecified: I50.9

## 2013-04-17 HISTORY — DX: Alcohol abuse, uncomplicated: F10.10

## 2013-04-17 HISTORY — DX: Malignant (primary) neoplasm, unspecified: C80.1

## 2013-04-17 HISTORY — DX: Constipation, unspecified: K59.00

## 2013-04-17 HISTORY — DX: Unspecified convulsions: R56.9

## 2013-04-17 LAB — CBC WITH DIFFERENTIAL/PLATELET
BASOS PCT: 0 % (ref 0–1)
Basophils Absolute: 0 10*3/uL (ref 0.0–0.1)
EOS ABS: 0.1 10*3/uL (ref 0.0–0.7)
EOS PCT: 1 % (ref 0–5)
HCT: 39.5 % (ref 39.0–52.0)
Hemoglobin: 14.5 g/dL (ref 13.0–17.0)
LYMPHS ABS: 1.4 10*3/uL (ref 0.7–4.0)
Lymphocytes Relative: 13 % (ref 12–46)
MCH: 35.9 pg — AB (ref 26.0–34.0)
MCHC: 36.7 g/dL — AB (ref 30.0–36.0)
MCV: 97.8 fL (ref 78.0–100.0)
Monocytes Absolute: 1.3 10*3/uL — ABNORMAL HIGH (ref 0.1–1.0)
Monocytes Relative: 12 % (ref 3–12)
Neutro Abs: 8.2 10*3/uL — ABNORMAL HIGH (ref 1.7–7.7)
Neutrophils Relative %: 75 % (ref 43–77)
PLATELETS: 154 10*3/uL (ref 150–400)
RBC: 4.04 MIL/uL — AB (ref 4.22–5.81)
RDW: 12.2 % (ref 11.5–15.5)
WBC: 10.9 10*3/uL — ABNORMAL HIGH (ref 4.0–10.5)

## 2013-04-17 LAB — COMPREHENSIVE METABOLIC PANEL
ALBUMIN: 3.5 g/dL (ref 3.5–5.2)
ALK PHOS: 90 U/L (ref 39–117)
ALT: 38 U/L (ref 0–53)
AST: 54 U/L — AB (ref 0–37)
BILIRUBIN TOTAL: 1.8 mg/dL — AB (ref 0.3–1.2)
BUN: 27 mg/dL — ABNORMAL HIGH (ref 6–23)
CHLORIDE: 91 meq/L — AB (ref 96–112)
CO2: 23 meq/L (ref 19–32)
Calcium: 9.7 mg/dL (ref 8.4–10.5)
Creatinine, Ser: 1.83 mg/dL — ABNORMAL HIGH (ref 0.50–1.35)
GFR calc Af Amer: 39 mL/min — ABNORMAL LOW (ref >90)
GFR, EST NON AFRICAN AMERICAN: 34 mL/min — AB (ref >90)
Glucose, Bld: 147 mg/dL — ABNORMAL HIGH (ref 70–99)
POTASSIUM: 4.5 meq/L (ref 3.7–5.3)
SODIUM: 133 meq/L — AB (ref 137–147)
Total Protein: 7.8 g/dL (ref 6.0–8.3)

## 2013-04-17 LAB — ABO/RH: ABO/RH(D): A POS

## 2013-04-17 LAB — TROPONIN I: Troponin I: <0.3 ng/mL (ref <0.30)

## 2013-04-17 LAB — URINALYSIS, ROUTINE W REFLEX MICROSCOPIC
GLUCOSE, UA: NEGATIVE mg/dL
Ketones, ur: 40 mg/dL — AB
Nitrite: POSITIVE — AB
Specific Gravity, Urine: 1.015 (ref 1.005–1.030)
Urobilinogen, UA: 1 mg/dL (ref 0.0–1.0)
pH: 5.5 (ref 5.0–8.0)

## 2013-04-17 LAB — URINE MICROSCOPIC-ADD ON

## 2013-04-17 LAB — GLUCOSE, CAPILLARY
Glucose-Capillary: 160 mg/dL — ABNORMAL HIGH (ref 70–99)
Glucose-Capillary: 133 mg/dL — ABNORMAL HIGH (ref 70–99)
Glucose-Capillary: 148 mg/dL — ABNORMAL HIGH (ref 70–99)

## 2013-04-17 LAB — HEMOGLOBIN A1C
HEMOGLOBIN A1C: 6.5 % — AB (ref <5.7)
Mean Plasma Glucose: 140 mg/dL — ABNORMAL HIGH (ref <117)

## 2013-04-17 LAB — TYPE AND SCREEN
ABO/RH(D): A POS
Antibody Screen: NEGATIVE

## 2013-04-17 MED ORDER — ATORVASTATIN CALCIUM 10 MG PO TABS
10.0000 mg | ORAL_TABLET | Freq: Every day | ORAL | Status: DC
  Administered 2013-04-18 – 2013-04-19 (×2): 10 mg via ORAL
  Filled 2013-04-17 (×4): qty 1

## 2013-04-17 MED ORDER — FINASTERIDE 5 MG PO TABS
5.0000 mg | ORAL_TABLET | Freq: Every day | ORAL | Status: DC
Start: 2013-04-17 — End: 2013-04-20
  Administered 2013-04-17 – 2013-04-20 (×4): 5 mg via ORAL
  Filled 2013-04-17 (×4): qty 1

## 2013-04-17 MED ORDER — VITAMIN C 500 MG PO TABS
500.0000 mg | ORAL_TABLET | Freq: Every day | ORAL | Status: DC
  Administered 2013-04-17 – 2013-04-20 (×4): 500 mg via ORAL
  Filled 2013-04-17 (×4): qty 1

## 2013-04-17 MED ORDER — VITAMIN B-1 100 MG PO TABS
100.0000 mg | ORAL_TABLET | Freq: Every day | ORAL | Status: DC
  Administered 2013-04-17 – 2013-04-20 (×3): 100 mg via ORAL
  Filled 2013-04-17 (×4): qty 1

## 2013-04-17 MED ORDER — DEXTROSE 5 % IV SOLN
1.0000 g | INTRAVENOUS | Status: DC
  Administered 2013-04-18 – 2013-04-19 (×2): 1 g via INTRAVENOUS
  Filled 2013-04-17 (×2): qty 10

## 2013-04-17 MED ORDER — CEFTRIAXONE SODIUM 1 G IJ SOLR
1.0000 g | Freq: Once | INTRAMUSCULAR | Status: AC
  Administered 2013-04-17: 1 g via INTRAVENOUS
  Filled 2013-04-17: qty 10

## 2013-04-17 MED ORDER — SODIUM CHLORIDE 0.9 % IJ SOLN
3.0000 mL | Freq: Two times a day (BID) | INTRAMUSCULAR | Status: DC
  Administered 2013-04-17 – 2013-04-19 (×2): 3 mL via INTRAVENOUS

## 2013-04-17 MED ORDER — FOLIC ACID 1 MG PO TABS
1.0000 mg | ORAL_TABLET | Freq: Every day | ORAL | Status: DC
  Filled 2013-04-17: qty 1

## 2013-04-17 MED ORDER — ADULT MULTIVITAMIN W/MINERALS CH
1.0000 | ORAL_TABLET | Freq: Every day | ORAL | Status: DC
  Administered 2013-04-17 – 2013-04-20 (×4): 1 via ORAL
  Filled 2013-04-17 (×4): qty 1

## 2013-04-17 MED ORDER — ACETAMINOPHEN 325 MG PO TABS: 650.0000 mg | ORAL_TABLET | Freq: Four times a day (QID) | ORAL | Status: DC | PRN

## 2013-04-17 MED ORDER — FOLIC ACID 1 MG PO TABS
1.0000 mg | ORAL_TABLET | Freq: Every day | ORAL | Status: DC
  Administered 2013-04-17 – 2013-04-20 (×4): 1 mg via ORAL
  Filled 2013-04-17 (×3): qty 1

## 2013-04-17 MED ORDER — LORAZEPAM 1 MG PO TABS
1.0000 mg | ORAL_TABLET | Freq: Four times a day (QID) | ORAL | Status: DC | PRN
  Administered 2013-04-18: 1 mg via ORAL
  Filled 2013-04-17: qty 1

## 2013-04-17 MED ORDER — INSULIN DETEMIR 100 UNIT/ML ~~LOC~~ SOLN
20.0000 [IU] | Freq: Every day | SUBCUTANEOUS | Status: DC
  Administered 2013-04-17 – 2013-04-19 (×3): 20 [IU] via SUBCUTANEOUS
  Filled 2013-04-17 (×4): qty 0.2

## 2013-04-17 MED ORDER — THIAMINE HCL 100 MG/ML IJ SOLN
100.0000 mg | Freq: Every day | INTRAMUSCULAR | Status: DC
  Administered 2013-04-18: 100 mg via INTRAVENOUS
  Filled 2013-04-17 (×3): qty 1

## 2013-04-17 MED ORDER — CITALOPRAM HYDROBROMIDE 10 MG/5ML PO SOLN
10.0000 mg | Freq: Every day | ORAL | Status: DC
  Administered 2013-04-17 – 2013-04-20 (×4): 10 mg via ORAL
  Filled 2013-04-17 (×4): qty 10

## 2013-04-17 MED ORDER — LORAZEPAM 2 MG/ML IJ SOLN: 1.0000 mg | Freq: Four times a day (QID) | INTRAMUSCULAR | Status: DC | PRN

## 2013-04-17 MED ORDER — ONDANSETRON HCL 4 MG/2ML IJ SOLN
4.0000 mg | Freq: Four times a day (QID) | INTRAMUSCULAR | Status: DC | PRN
  Administered 2013-04-19: 4 mg via INTRAVENOUS
  Filled 2013-04-17: qty 2

## 2013-04-17 MED ORDER — INSULIN ASPART 100 UNIT/ML ~~LOC~~ SOLN
0.0000 [IU] | Freq: Three times a day (TID) | SUBCUTANEOUS | Status: DC
  Administered 2013-04-18: 2 [IU] via SUBCUTANEOUS
  Administered 2013-04-18: 1 [IU] via SUBCUTANEOUS
  Administered 2013-04-18 – 2013-04-19 (×2): 3 [IU] via SUBCUTANEOUS
  Administered 2013-04-19: 2 [IU] via SUBCUTANEOUS
  Administered 2013-04-20: 1 [IU] via SUBCUTANEOUS
  Administered 2013-04-20: 2 [IU] via SUBCUTANEOUS

## 2013-04-17 MED ORDER — INSULIN ASPART 100 UNIT/ML ~~LOC~~ SOLN
0.0000 [IU] | Freq: Every day | SUBCUTANEOUS | Status: DC
  Administered 2013-04-18: 2 [IU] via SUBCUTANEOUS

## 2013-04-17 MED ORDER — ONDANSETRON HCL 4 MG PO TABS: 4.0000 mg | ORAL_TABLET | Freq: Four times a day (QID) | ORAL | Status: DC | PRN

## 2013-04-17 MED ORDER — VITAMIN D 1000 UNITS PO TABS
5000.0000 [IU] | ORAL_TABLET | Freq: Every day | ORAL | Status: DC
  Administered 2013-04-17 – 2013-04-20 (×4): 5000 [IU] via ORAL
  Filled 2013-04-17 (×4): qty 5

## 2013-04-17 MED ORDER — ACETAMINOPHEN 650 MG RE SUPP: 650.0000 mg | Freq: Four times a day (QID) | RECTAL | Status: DC | PRN

## 2013-04-17 MED ORDER — SODIUM CHLORIDE 0.9 % IV SOLN
INTRAVENOUS | Status: DC
  Administered 2013-04-17 – 2013-04-19 (×4): via INTRAVENOUS

## 2013-04-17 NOTE — Progress Notes (Signed)
Urologist is Tannebaum. Patient and family are very interested in see him.

## 2013-04-17 NOTE — Progress Notes (Signed)
Tried calling ED to obtain report. On hold for several minutes, will try back. Tangie Stay A

## 2013-04-17 NOTE — ED Notes (Signed)
Bed: RESA Expected date:  Expected time:  Means of arrival:  Comments: EMS dizziness hypotension

## 2013-04-17 NOTE — ED Notes (Signed)
Tat MD aware orthostatic vitals unable to complete due to pt unstable on feet with assistance.

## 2013-04-17 NOTE — ED Notes (Addendum)
Pt reports severe abd pain since 0400 this am with urge to urinate. Wife reports blood/clots in urine since last night. Pt feels like he emptied bladder en route with EMS via brief. Pt denies pain at present time.

## 2013-04-17 NOTE — H&P (Signed)
[facility] History and Physical  Daniel [name] [id number] DOB: [birth year] DOA: 04/17/2013   PCP: Tommy Medal, MD   Chief Complaint: Abdominal pain, dysuria, near syncope  HPI:  80 year old male with a history of prostate cancer, diabetes mellitus, hypertension, alcohol dependence presents with a three-day history of near syncope. The patient has a chronic history of dizziness which appears to be from orthostasis from his history. The patient's wife is at the bedside to supplement the history. However, in the past 3 days it has worsened. The patient endorses decreased by mouth intake, but denies any vomiting or diarrhea. 3 days ago, the patient had a near syncopal episode walking back to his bed. He did not have any chest discomfort, shortness of breath, or palpitations but just felt lightheaded. Another similar episode happened on the morning of admission. 24 hours prior to admission, the patient complained of hematuria with suprapubic pain and dysuria. He also complained of urinating some blood clots. Upon arrival to the ED,  Urology, Dr. Carole Binning was contacted.  A foley was placed in the ED when bladder scan noted 240cc of fluid. The patient denies fevers, chills, chest discomfort, headache, vomiting, diarrhea, abdominal pain, hematochezia, melena. The patient has not been started on any new medications the past month. In fact, his primary care provider has told him to stop all his antihypertensive medications due to his orthostasis. He has not been on any antihypertensive medications for approximately one year. The patient's wife states that he drinks at least 1/4 of a fifth of vodka daily for over 20 yrs.  Per EMS, pt had systolic BP in [id number] which improved with 500cc NS.  In the ED, the patient is hemodynamically stable. Sodium 133, serum creatinine 1.3, AST 54, ALT 38, phosphatase 90, total bilirubin 1.8. WBC was 10.9. Platelets 154,000. Urinalysis showed TNTC WBC and  RBC. Assessment/Plan: Acute on chronic renal failure -Secondary to dehydration -Baseline creatinine 1.0-1.1 -IV fluids -Abdominal ultrasound which will also evaluate the patient's liver and gallbladder Hematuria/dysuria/pyuria -pt normally sees Dr. Gaynelle Arabian -urology has been consulted by EDP -Urine culture pending, continue ceftriaxone -Hold aspirin Near syncope -Likely due to volume depletion -Orthostatic vital signs -Echocardiogram -Telemetry Alcohol dependence -Alcohol withdrawal protocol Transaminasemia with hyperbilirubinemia -abdominal US -likely due to Etoh liver disease Diabetes mellitus type 2 -Will give half home dose of Levemir -NovoLog sliding scale -Hemoglobin A1c      Past Medical History  Diagnosis Date  . Cancer     prostate  . Alcohol abuse    Past Surgical History  Procedure Laterality Date  . Radioactive seed implant      prostate  . Coronary stent placement      2004   Social History:  reports that he has never smoked. He does not have any smokeless tobacco history on file. He reports that he drinks alcohol. He reports that he does not use illicit drugs.   No family history on file.   No Known Allergies    Prior to Admission medications   Medication Sig Start Date End Date Taking? Authorizing Provider  aspirin 81 MG tablet Take 81 mg by mouth daily.   Yes Historical Provider, MD  B Complex-C (SUPER B COMPLEX PO) Take 1 tablet by mouth daily.   Yes Historical Provider, MD  Cholecalciferol (D-3-5) 5000 UNITS capsule Take 5,000 Units by mouth daily.   Yes Historical Provider, MD  citalopram (CELEXA) 10 MG/5ML suspension Take by mouth daily.   Yes Historical Provider, MD  finasteride (PROSCAR)  5 MG tablet  01/09/13  Yes Historical Provider, MD  folic acid (FOLVITE) 1 MG tablet Take 1 mg by mouth daily.   Yes Historical Provider, MD  insulin lispro (HUMALOG) 100 UNIT/ML injection Inject 5-10 Units into the skin at bedtime.   Yes  Historical Provider, MD  LEVEMIR FLEXTOUCH 100 UNIT/ML SOPN Inject 48 Units into the skin daily.  02/19/13  Yes Historical Provider, MD  Multiple Vitamin (MULTIVITAMIN) tablet Take 1 tablet by mouth daily.   Yes Historical Provider, MD  potassium & sodium phosphates (PHOS-NAK) 280-160-250 MG PACK Take 1 packet by mouth 4 (four) times daily -  with meals and at bedtime.   Yes Historical Provider, MD  potassium chloride (K-DUR) 10 MEQ tablet Take 10 mEq by mouth daily.   Yes Historical Provider, MD  rosuvastatin (CRESTOR) 5 MG tablet Take 5 mg by mouth daily.   Yes Historical Provider, MD  trimethoprim (TRIMPEX) 100 MG tablet  02/19/13  Yes Historical Provider, MD  vitamin C (ASCORBIC ACID) 500 MG tablet Take 500 mg by mouth daily.   Yes Historical Provider, MD    Review of Systems:  Constitutional:  No weight loss, night sweats, Fevers, chills  Head&Eyes: No headache.  No vision loss.  No eye pain or scotoma ENT:  No Difficulty swallowing,Tooth/dental problems,Sore throat,  No ear ache, post nasal drip,  Cardio-vascular:  No chest pain, Orthopnea, PND, swelling in lower extremities,  dizziness, palpitations  GI:  No  abdominal pain,vomiting, diarrhea, loss of appetite, hematochezia, melena, heartburn, indigestion, Resp:  No shortness of breath with exertion or at rest. No cough. No coughing up of blood .No wheezing.No chest wall deformity  Skin:  no rash or lesions.  GU:  No change in color of urine, no urgency or frequency. No flank pain. +hematuria Musculoskeletal:  No joint pain or swelling. No decreased range of motion. No back pain.  Psych:  No change in mood or affect.  Neurologic: No headache, no dysesthesia, no focal weakness, no vision loss. No syncope  Physical Exam: Filed Vitals:   04/17/13 1030 04/17/13 1100 04/17/13 1139 04/17/13 1142  BP:  151/74 144/71   Pulse: 94 102  95  Temp:      TempSrc:      Resp: 21 21  25   SpO2: 99% 97% 97% 97%   General:  A&O x 3,  NAD, nontoxic, pleasant/cooperative Head/Eye: No conjunctival hemorrhage, no icterus, [place]/AT, No nystagmus ENT:  No icterus,  No thrush, good dentition, no pharyngeal exudate Neck:  No masses, no lymphadenpathy CV:  RRR, no rub, no gallop, no S3 Lung:  CTAB, good air movement, no wheeze, no rhonchi Abdomen: soft/NT, +BS, nondistended, no peritoneal signs Ext: No cyanosis, No rashes, No petechiae, No lymphangitis, No edema Neuro: CNII-XII intact, strength 4/5 in bilateral upper and lower extremities, no dysmetria  Labs on Admission:  Basic Metabolic Panel:  Recent Labs Lab 04/17/13 0950  NA 133*  K 4.5  CL 91*  CO2 23  GLUCOSE 147*  BUN 27*  CREATININE 1.83*  CALCIUM 9.7   Liver Function Tests:  Recent Labs Lab 04/17/13 0950  AST 54*  ALT 38  ALKPHOS 90  BILITOT 1.8*  PROT 7.8  ALBUMIN 3.5   No results found for this basename: LIPASE, AMYLASE,  in the last 168 hours No results found for this basename: AMMONIA,  in the last 168 hours CBC:  Recent Labs Lab 04/17/13 0950  WBC 10.9*  NEUTROABS 8.2*  HGB 14.5  HCT 39.5  MCV 97.8  PLT 154   Cardiac Enzymes: No results found for this basename: CKTOTAL, CKMB, CKMBINDEX, TROPONINI,  in the last 168 hours BNP: No components found with this basename: POCBNP,  CBG: No results found for this basename: GLUCAP,  in the last 168 hours  Radiological Exams on Admission: No results found.  EKG: Independently reviewed. Sinus rhythm, nonspecific T-wave changes    Time spent:65 minutes Code Status:   FULL Family Communication:   Wife at bedside   Raymont Andreoni, DO  [facility] Pager [phone]  If 7PM-7AM, please contact night-coverage [email or web address] Password [facility] 04/17/2013, 12:15 PM

## 2013-04-17 NOTE — ED Notes (Signed)
240 ml read in bladder via bladder scanner.

## 2013-04-17 NOTE — Progress Notes (Signed)
UR completed 

## 2013-04-17 NOTE — ED Notes (Signed)
Per ems pt reports around 0430 went to use bathroom, noticed blood in urine, pt was unable to get back into bed. ems found pt on the floor. Pt reports "he did not really fall, just tried to lay himself down". Negative for SCCA, intiall BP 81/45, pt mentating well, c/o light headedness, feeling pressure in groin. Reports he has not been able to urinate since. ems did notice blood on pts leg. Pt urinated in depends en route. Pt reports this relieved some pressure.  20 R AC, 543ml NS bolus given en route. BP normotensive now.

## 2013-04-17 NOTE — ED Provider Notes (Addendum)
CSN: [id number]     Arrival date & time 04/17/13  [id number] History   First MD Initiated Contact with Patient 04/17/13 0901     Chief Complaint  Patient presents with  . Hypotension  . blood in urine    (Consider location/radiation/quality/duration/timing/severity/associated sxs/prior Treatment) HPI Complains of pain in the bladder until this morning accompanied by suprapubic discomfort. Patient also "collapsed suffering a syncopal event this morning. His wife reports that he had a near syncopal event 2 days ago as well. He's been urinating blood with blood clots since yesterday. He denies pain presently since having urinated while en route. EMS reports that his blood pressure was 81/45 upon arrival. They treated him with peripheral IV of normal saline he received 500 mL bolus intravenously while en route. Past Medical History  Diagnosis Date  . Cancer     prostate  . Alcohol abuse    Past Surgical History  Procedure Laterality Date  . Radioactive seed implant      prostate  . Coronary stent placement      2004   No family history on file. History  Substance Use Topics  . Smoking status: Never Smoker   . Smokeless tobacco: Not on file  . Alcohol Use: Yes    Review of Systems  Gastrointestinal: Positive for abdominal pain.  Genitourinary: Positive for hematuria.  Neurological: Positive for weakness.  All other systems reviewed and are negative.    Allergies  Review of patient's allergies indicates not on file.  Home Medications   Current Outpatient Rx  Name  Route  Sig  Dispense  Refill  . aspirin 81 MG tablet   Oral   Take 81 mg by mouth daily.         . citalopram (CELEXA) 10 MG/5ML suspension   Oral   Take by mouth daily.         . finasteride (PROSCAR) 5 MG tablet               . folic acid (FOLVITE) 1 MG tablet   Oral   Take 1 mg by mouth daily.         . insulin aspart protamine- aspart (NOVOLOG MIX 70/30) (70-30) 100 UNIT/ML injection    Subcutaneous   Inject into the skin.         Marland Kitchen LEVEMIR FLEXTOUCH 100 UNIT/ML SOPN               . Multiple Vitamin (MULTIVITAMIN) tablet   Oral   Take 1 tablet by mouth daily.         . potassium & sodium phosphates (PHOS-NAK) 280-160-250 MG PACK   Oral   Take 1 packet by mouth 4 (four) times daily -  with meals and at bedtime.         . potassium chloride (K-DUR) 10 MEQ tablet   Oral   Take 10 mEq by mouth daily.         . rosuvastatin (CRESTOR) 5 MG tablet   Oral   Take 5 mg by mouth daily.         Marland Kitchen trimethoprim (TRIMPEX) 100 MG tablet                BP 150/64  Pulse 87  Temp(Src) 98 F (36.7 C) (Oral)  Resp 20  SpO2 97% Physical Exam  Nursing note and vitals reviewed. Constitutional: He is oriented to person, place, and time. He appears well-developed and well-nourished. No distress.  HENT:  Head:  Normocephalic and atraumatic.  Eyes: Conjunctivae are normal. Pupils are equal, round, and reactive to light.  Neck: Neck supple. No tracheal deviation present. No thyromegaly present.  Cardiovascular: Normal rate and regular rhythm.   No murmur heard. Pulmonary/Chest: Effort normal and breath sounds normal.  Abdominal: Soft. Bowel sounds are normal. He exhibits no distension. There is no tenderness.  Genitourinary:  Uncircumcized, clots at meatus  Musculoskeletal: Normal range of motion. He exhibits no edema and no tenderness.  Neurological: He is alert and oriented to person, place, and time. No cranial nerve deficit. Coordination normal.  Skin: Skin is warm and dry. No rash noted.  Psychiatric: He has a normal mood and affect.    ED Course  Procedures (including critical care time) Labs Review Labs Reviewed - No data to display Imaging Review No results found.  EKG Interpretation    Date/Time:  Monday April 17 2013 09:43:42 EST Ventricular Rate:  89 PR Interval:  164 QRS Duration: 113 QT Interval:  392 QTC Calculation: 477 R  Axis:   -68 Text Interpretation:  Normal sinus rhythm Left axis deviation Nonspecific T wave abnormality No significant change since last tracing Confirmed by Winfred Leeds  MD, Telisa Ohlsen ([id number]) on 04/17/2013 11:29:11 AM           Results for orders placed during the hospital encounter of 04/17/13  COMPREHENSIVE METABOLIC PANEL      Result Value Range   Sodium 133 (*) 137 - 147 mEq/L   Potassium 4.5  3.7 - 5.3 mEq/L   Chloride 91 (*) 96 - 112 mEq/L   CO2 23  19 - 32 mEq/L   Glucose, Bld 147 (*) 70 - 99 mg/dL   BUN 27 (*) 6 - 23 mg/dL   Creatinine, Ser 1.83 (*) 0.50 - 1.35 mg/dL   Calcium 9.7  8.4 - 10.5 mg/dL   Total Protein 7.8  6.0 - 8.3 g/dL   Albumin 3.5  3.5 - 5.2 g/dL   AST 54 (*) 0 - 37 U/L   ALT 38  0 - 53 U/L   Alkaline Phosphatase 90  39 - 117 U/L   Total Bilirubin 1.8 (*) 0.3 - 1.2 mg/dL   GFR calc non Af Amer 34 (*) >90 mL/min   GFR calc Af Amer 39 (*) >90 mL/min  CBC WITH DIFFERENTIAL      Result Value Range   WBC 10.9 (*) 4.0 - 10.5 K/uL   RBC 4.04 (*) 4.22 - 5.81 MIL/uL   Hemoglobin 14.5  13.0 - 17.0 g/dL   HCT 39.5  39.0 - 52.0 %   MCV 97.8  78.0 - 100.0 fL   MCH 35.9 (*) 26.0 - 34.0 pg   MCHC 36.7 (*) 30.0 - 36.0 g/dL   RDW 12.2  11.5 - 15.5 %   Platelets 154  150 - 400 K/uL   Neutrophils Relative % 75  43 - 77 %   Neutro Abs 8.2 (*) 1.7 - 7.7 K/uL   Lymphocytes Relative 13  12 - 46 %   Lymphs Abs 1.4  0.7 - 4.0 K/uL   Monocytes Relative 12  3 - 12 %   Monocytes Absolute 1.3 (*) 0.1 - 1.0 K/uL   Eosinophils Relative 1  0 - 5 %   Eosinophils Absolute 0.1  0.0 - 0.7 K/uL   Basophils Relative 0  0 - 1 %   Basophils Absolute 0.0  0.0 - 0.1 K/uL  URINALYSIS, ROUTINE W REFLEX MICROSCOPIC  Result Value Range   Color, Urine RED (*) YELLOW   APPearance TURBID (*) CLEAR   Specific Gravity, Urine 1.015  1.005 - 1.030   pH 5.5  5.0 - 8.0   Glucose, UA NEGATIVE  NEGATIVE mg/dL   Hgb urine dipstick LARGE (*) NEGATIVE   Bilirubin Urine LARGE (*) NEGATIVE    Ketones, ur 40 (*) NEGATIVE mg/dL   Protein, ur >300 (*) NEGATIVE mg/dL   Urobilinogen, UA 1.0  0.0 - 1.0 mg/dL   Nitrite POSITIVE (*) NEGATIVE   Leukocytes, UA LARGE (*) NEGATIVE  URINE MICROSCOPIC-ADD ON      Result Value Range   WBC, UA TOO NUMEROUS TO COUNT  <3 WBC/hpf   RBC / HPF TOO NUMEROUS TO COUNT  <3 RBC/hpf   Bacteria, UA MANY (*) RARE   Urine-Other FIELD OBSCURED BY RBC'S    TYPE AND SCREEN      Result Value Range   ABO/RH(D) A POS     Antibody Screen NEG     Sample Expiration 04/20/2013    ABO/RH      Result Value Range   ABO/RH(D) A POS     No results found.  Bladder scan showed 240 mL post void residual. Foley catheter inserted. Case discussed with Dr Jeffie Pollock who will see patient in consultation  MDM  No diagnosis found. In light of hypotension and near syncope patient be admitted for intravenous fluids and antibiotics. I discussed the case with  Dr Tat who will arrange for inpatient stay Diagnosis #1 urinary tract infection #2 hematuria #3 near syncope #4 hyperglycemia  #6 renal insufficiency  Orlie Dakin, MD 04/17/13 [place], MD 04/17/13 1241

## 2013-04-17 NOTE — ED Notes (Signed)
Pharmacy contacted to request 1400 medications.

## 2013-04-17 NOTE — ED Notes (Signed)
Nurse on floor aware [place] at bedside will be delay in transfer.

## 2013-04-17 NOTE — Progress Notes (Signed)
Echo Lab  2D Echocardiogram completed.  [name], Daniel 04/17/2013 2:28 PM

## 2013-04-18 DIAGNOSIS — N390 Urinary tract infection, site not specified: Secondary | ICD-10-CM

## 2013-04-18 LAB — CBC
HEMATOCRIT: 32.9 % — AB (ref 39.0–52.0)
HEMOGLOBIN: 12.1 g/dL — AB (ref 13.0–17.0)
MCH: 35.4 pg — AB (ref 26.0–34.0)
MCHC: 36.8 g/dL — ABNORMAL HIGH (ref 30.0–36.0)
MCV: 96.2 fL (ref 78.0–100.0)
Platelets: 131 10*3/uL — ABNORMAL LOW (ref 150–400)
RBC: 3.42 MIL/uL — AB (ref 4.22–5.81)
RDW: 12.1 % (ref 11.5–15.5)
WBC: 11.5 10*3/uL — ABNORMAL HIGH (ref 4.0–10.5)

## 2013-04-18 LAB — GLUCOSE, CAPILLARY
GLUCOSE-CAPILLARY: 216 mg/dL — AB (ref 70–99)
GLUCOSE-CAPILLARY: 203 mg/dL — AB (ref 70–99)
Glucose-Capillary: 142 mg/dL — ABNORMAL HIGH (ref 70–99)
Glucose-Capillary: 172 mg/dL — ABNORMAL HIGH (ref 70–99)

## 2013-04-18 LAB — COMPREHENSIVE METABOLIC PANEL
ALT: 29 U/L (ref 0–53)
AST: 39 U/L — ABNORMAL HIGH (ref 0–37)
Albumin: 2.8 g/dL — ABNORMAL LOW (ref 3.5–5.2)
Alkaline Phosphatase: 75 U/L (ref 39–117)
BUN: 28 mg/dL — ABNORMAL HIGH (ref 6–23)
CHLORIDE: 96 meq/L (ref 96–112)
CO2: 21 meq/L (ref 19–32)
Calcium: 8.7 mg/dL (ref 8.4–10.5)
Creatinine, Ser: 1.49 mg/dL — ABNORMAL HIGH (ref 0.50–1.35)
GFR calc Af Amer: 50 mL/min — ABNORMAL LOW (ref >90)
GFR calc non Af Amer: 44 mL/min — ABNORMAL LOW (ref >90)
Glucose, Bld: 173 mg/dL — ABNORMAL HIGH (ref 70–99)
POTASSIUM: 3.5 meq/L — AB (ref 3.7–5.3)
Sodium: 131 mEq/L — ABNORMAL LOW (ref 137–147)
Total Bilirubin: 1.3 mg/dL — ABNORMAL HIGH (ref 0.3–1.2)
Total Protein: 6.4 g/dL (ref 6.0–8.3)

## 2013-04-18 NOTE — Consult Note (Signed)
Pt seen and examined. Agree with PA note. Urine clear tonight.  Will follow.

## 2013-04-18 NOTE — Progress Notes (Signed)
[facility] PROGRESS NOTE  Daniel BLEGEN [id number] DOB: Jun 06, 1933 DOA: 04/17/2013 PCP: Tommy Medal, MD  Interim history 80 year old male with a history of prostate cancer, diabetes mellitus, hypertension, alcohol dependence presents with a three-day history of near syncope. The patient has a chronic history of dizziness which appears to be from orthostasis from his history. The patient was diagnosed with gross hematuria likely from UTI and history of radiation cystitis. The patient was started on empiric ceftriaxone. IV fluids were started for acute on chronic renal failure likely due to volume depletion. The patient's hematuria improved on antibiotics and with hydration. Urology was consulted for gross hematuria. CIWA started for hx of alcohol dependence.  Assessment/Plan: Acute on chronic renal failure  -Secondary to dehydration  -Baseline creatinine 1.0-1.1  -IV fluids  -No hydronephrosis on abdominal ultrasound UTI/gross hematuria with hx radiation cystitis  -pt normally sees Dr. Gaynelle Arabian  -appreciate Dr. Gaynelle Arabian consult -Urine culture--GNR, continue ceftriaxone and adjust accordingly -Hold aspirin  -Will need voiding trial prior to discharge Near syncope  -Likely due to volume depletion  -Orthostatic vital signs--neg  -Echocardiogram--grade 1 diastolic dysfxn, no EF noted  -Telemetry  Alcohol dependence  -Alcohol withdrawal protocol  -Last drink on the evening of 04/16/2013 Transaminasemia with hyperbilirubinemia  -abdominal us--echodense liver, nonobstructive R-renal stone -likely due to Etoh liver disease  Diabetes mellitus type 2  -Will give half home dose of Levemir--CBGs remain controlled  -NovoLog sliding scale  -Hemoglobin A1c--6.5 Hyponatremia - Likely due to the patient's liver cirrhosis   Family Communication:   wifeat beside Disposition Plan:   Home when medically stable   Antibiotics:  Ceftriaxone  04/17/2013>>>    Procedures/Studies: US Abdomen Complete  04/17/2013   CLINICAL DATA:  Abnormal liver function test.  Hematuria.  EXAM: ULTRASOUND ABDOMEN COMPLETE  COMPARISON:  CT ABD-PELV WO/W CM (HEMATURIA) dated 01/31/2010  FINDINGS: Gallbladder:  Punctate echogenic focus noted in the fundus of the gallbladder. This is most likely tiny polyp. This measures 4 mm. No gallstones. Negative Murphy sign. Gallbladder wall thickness 2.1 mm. No pericholecystic fluid collections.  Common bile duct:  Diameter: 4.6 mm  Liver:  Liver is dense with coarse echotexture suggesting chronic hepatocellular disease and/or fatty infiltration.  IVC:  No abnormality visualized.  Pancreas:  Visualized portion unremarkable.  Spleen:  Size and appearance within normal limits.  Right Kidney:  Length: 12 cm. 8 mm stone right lower pole. This is nonobstructing.  Left Kidney:  Length: 5.6 cm. Mild cortical thinning. 1.3 x 0.8 x 1.2 cm cyst left upper renal pole.  Abdominal aorta:  No aneurysm visualized.  Other findings:  None.  IMPRESSION: 1. Tiny gallbladder polyp. 2. Echo dense liver with coarse echotexture suggesting chronic hepatocellular disease and/or fatty infiltration. 3. 8 mm nonobstructing stone right kidney.   Electronically Signed   By: Marcello Moores  Register   On: 04/17/2013 18:28         Subjective: Patient is much more alert today. Denies fevers, chills, chest pain, shortness breath, nausea, vomiting, diarrhea, abdominal pain, dizziness, headache  Objective: Filed Vitals:   04/17/13 1800 04/18/13 0010 04/18/13 0609 04/18/13 1406  BP: 142/78 132/66 127/80 128/66  Pulse: 100 95 89 86  Temp:  98.7 F (37.1 C) 98.9 F (37.2 C) 98 F (36.7 C)  TempSrc:  Oral Oral Oral  Resp:  18 18 18   Height:      Weight:      SpO2:  96% 97% 100%    Intake/Output Summary (Last 24 hours) at 04/18/13  1922 Last data filed at 04/18/13 1300  Gross per 24 hour  Intake 1987.5 ml  Output    925 ml  Net 1062.5 ml   Weight  change:  Exam:   General:  Pt is alert, follows commands appropriately, not in acute distress  HEENT: No icterus, No thrush,  [place]/AT  Cardiovascular: RRR, S1/S2, no rubs, no gallops  Respiratory: CTA bilaterally, no wheezing, no crackles, no rhonchi  Abdomen: Soft/+BS, non tender, non distended, no guarding  Extremities: No edema, No lymphangitis, No petechiae, No rashes, no synovitis  Data Reviewed: Basic Metabolic Panel:  Recent Labs Lab 04/17/13 0950 04/18/13 0353  NA 133* 131*  K 4.5 3.5*  CL 91* 96  CO2 23 21  GLUCOSE 147* 173*  BUN 27* 28*  CREATININE 1.83* 1.49*  CALCIUM 9.7 8.7   Liver Function Tests:  Recent Labs Lab 04/17/13 0950 04/18/13 0353  AST 54* 39*  ALT 38 29  ALKPHOS 90 75  BILITOT 1.8* 1.3*  PROT 7.8 6.4  ALBUMIN 3.5 2.8*   No results found for this basename: LIPASE, AMYLASE,  in the last 168 hours No results found for this basename: AMMONIA,  in the last 168 hours CBC:  Recent Labs Lab 04/17/13 0950 04/18/13 0353  WBC 10.9* 11.5*  NEUTROABS 8.2*  --   HGB 14.5 12.1*  HCT 39.5 32.9*  MCV 97.8 96.2  PLT 154 131*   Cardiac Enzymes:  Recent Labs Lab 04/17/13 1310 04/17/13 1846  TROPONINI <0.30 <0.30   BNP: No components found with this basename: POCBNP,  CBG:  Recent Labs Lab 04/17/13 1701 04/17/13 2129 04/18/13 0718 04/18/13 1140 04/18/13 1718  GLUCAP 133* 148* 142* 172* 216*    Recent Results (from the past 240 hour(s))  URINE CULTURE     Status: None   Collection Time    04/17/13 10:14 AM      Result Value Range Status   Specimen Description URINE, CATHETERIZED   Final   Special Requests NONE   Final   Culture  Setup Time     Final   Value: 04/17/2013 14:13     Performed at [place]     Final   Value: >=100,000 COLONIES/ML     Performed at [facility]   Culture     Final   Value: [place]     Performed at [facility]   Report Status PENDING    Incomplete     Scheduled Meds: . atorvastatin  10 mg Oral q1800  . cefTRIAXone (ROCEPHIN)  IV  1 g Intravenous Q24H  . cholecalciferol  5,000 Units Oral Daily  . citalopram  10 mg Oral Daily  . finasteride  5 mg Oral Daily  . folic acid  1 mg Oral Daily  . insulin aspart  0-5 Units Subcutaneous QHS  . insulin aspart  0-9 Units Subcutaneous TID WC  . insulin detemir  20 Units Subcutaneous QHS  . multivitamin with minerals  1 tablet Oral Daily  . sodium chloride  3 mL Intravenous Q12H  . thiamine  100 mg Oral Daily   Or  . thiamine  100 mg Intravenous Daily  . vitamin C  500 mg Oral Daily   Continuous Infusions: . sodium chloride 75 mL/hr at 04/18/13 1906     Aleli Navedo, DO  [facility] Pager [phone]  If 7PM-7AM, please contact night-coverage [email or web address] Password [email or web address] 04/18/2013, 7:22 PM   LOS: 1 day

## 2013-04-18 NOTE — Consult Note (Signed)
Urology Consult   Physician requesting consult: Tat  Reason for consult: Gross hematuria and UTI  History of Present Illness: Daniel [name] is a 80 y.o. male with PMH significant for prostate cancer s/p radiation seed implant, ETOH abuse, radiation cystitis, incontinence, CAD, and DM who presented to the ED yesterday with c/o syncopal episodes and gross hematuria. His wife states that she noted one episode of [place] a week and a half ago.  She encouraged pt to be evaled by Dr. Gaynelle Arabian at that time and the pt refused.  His urine cleared after that until Sunday when he had gross hematuria again that has persisted. Sat and Sun the pt experienced near syncopal episodes which prompted his wife to call 911 and the pt was transported to [facility] early Monday morning.  He was found to have GH, hypotension, and acute on chronic renal failure due to dehydration.  He was admitted and a foley was placed.  Per his sons report, his initial urine output was noted to be very dark red.  This has cleared up and is currently dark yellow.  UA on admission was nitrite positive with large amounts of RBCs, WBCs, and bacteria.  Culture is pending.  Pt was started on Rocephin.  Abdominal U/S revealed an 8mm non obstructing lower pole right renal stone.   Pt has a long hx of incontinence and recurrent UTIs.  He states that he has been able to void without difficulty and feels as though he empties with each void.  He has also had hematuria in the past but states that is has "been a long time" since he had an episode and denies any infections since his last eval by Dr. Tannenbaum which was 10/2012.  He is currently resting and is without complaint except that he wants to go home.  He denies F/C, CP, SOB, N/V, and diarrhea/constipation.    Past Medical History  Diagnosis Date  . Cancer     prostate  . Alcohol abuse   . Hypertension   . Hyperlipidemia   . Complication of anesthesia     Hx of seizure when put to sleep for surgery  on 08/2009  . Coronary artery disease   . Peripheral neuropathy   . CHF (congestive heart failure)   . H/O hiatal hernia   . Seizures   . Constipation   . Urinary incontinence   . Cardiomyopathy   . Diabetes mellitus without complication     Past Surgical History  Procedure Laterality Date  . Radioactive seed implant      prostate  . Coronary stent placement      2004  . Prostate surgery  2008    SEED IMPLANTS  . Cataract extraction, bilateral    . Tonsillectomy      19 40's  . Coronary angioplasty      stents x 2    Current Hospital Medications:  Home Meds:    Medication List    ASK your doctor about these medications       aspirin 81 MG tablet  Take 81 mg by mouth daily.     citalopram 10 MG/5ML suspension  Commonly known as:  CELEXA  Take by mouth daily.     D-3-5 5000 UNITS capsule  Generic drug:  Cholecalciferol  Take 5,000 Units by mouth daily.     finasteride 5 MG tablet  Commonly known as:  PROSCAR     folic acid 1 MG tablet  Commonly known as:  [employer]  Take 1 mg by mouth daily.     insulin lispro 100 UNIT/ML injection  Commonly known as:  HUMALOG  Inject 5-10 Units into the skin at bedtime.     LEVEMIR FLEXTOUCH 100 UNIT/ML Pen  Generic drug:  Insulin Detemir  Inject 48 Units into the skin daily.     multivitamin tablet  Take 1 tablet by mouth daily.     potassium & sodium phosphates 280-160-250 MG Pack  Commonly known as:  PHOS-NAK  Take 1 packet by mouth 4 (four) times daily -  with meals and at bedtime.     potassium chloride 10 MEQ tablet  Commonly known as:  K-DUR  Take 10 mEq by mouth daily.     rosuvastatin 5 MG tablet  Commonly known as:  CRESTOR  Take 5 mg by mouth daily.     SUPER B COMPLEX PO  Take 1 tablet by mouth daily.     trimethoprim 100 MG tablet  Commonly known as:  TRIMPEX     vitamin C 500 MG tablet  Commonly known as:  ASCORBIC ACID  Take 500 mg by mouth daily.        Scheduled Meds: . atorvastatin   10 mg Oral q1800  . cefTRIAXone (ROCEPHIN)  IV  1 g Intravenous Q24H  . cholecalciferol  5,000 Units Oral Daily  . citalopram  10 mg Oral Daily  . finasteride  5 mg Oral Daily  . folic acid  1 mg Oral Daily  . insulin aspart  0-5 Units Subcutaneous QHS  . insulin aspart  0-9 Units Subcutaneous TID WC  . insulin detemir  20 Units Subcutaneous QHS  . multivitamin with minerals  1 tablet Oral Daily  . sodium chloride  3 mL Intravenous Q12H  . thiamine  100 mg Oral Daily   Or  . thiamine  100 mg Intravenous Daily  . vitamin C  500 mg Oral Daily   Continuous Infusions: . sodium chloride 75 mL/hr at 04/18/13 0453   PRN Meds:.acetaminophen, acetaminophen, LORazepam, LORazepam, ondansetron (ZOFRAN) IV, ondansetron  Allergies: No Known Allergies  History reviewed. No pertinent family history.  Social History:  reports that he has never smoked. He has never used smokeless tobacco. He reports that he drinks alcohol. He reports that he does not use illicit drugs.  ROS: A complete review of systems was performed.  All systems are negative except for pertinent findings as noted.  Physical Exam:  Vital signs in last 24 hours: Temp:  [98 F (36.7 C)-98.9 F (37.2 C)] 98 F (36.7 C) (01/27 1406) Pulse Rate:  [86-100] 86 (01/27 1406) Resp:  [18] 18 (01/27 1406) BP: (127-142)/(66-80) 128/66 mmHg (01/27 1406) SpO2:  [96 %-100 %] 100 % (01/27 1406) Weight:  [76.3 kg (168 lb 3.4 oz)] 76.3 kg (168 lb 3.4 oz) (01/26 1700) Constitutional:  Alert and oriented, No acute distress Cardiovascular: Regular rate and rhythm, No JVD Respiratory: Normal respiratory effort GI: Abdomen is soft, nontender, nondistended, no abdominal masses GU: No CVA tenderness; foley in place with pinkish yellow urine in bag; some clots in bag; urine in tubing is yellow Lymphatic: No lymphadenopathy Neurologic: Grossly intact, no focal deficits  Psychiatric: Normal mood and affect  Laboratory Data:   Recent Labs   04/17/13 0950 04/18/13 0353  WBC 10.9* 11.5*  HGB 14.5 12.1*  HCT 39.5 32.9*  PLT 154 131*     Recent Labs  04/17/13 0950 04/18/13 0353  NA 133* 131*  K 4.5 3.5*  CL 91* 96  GLUCOSE 147* 173*  BUN 27* 28*  CALCIUM 9.7 8.7  CREATININE 1.83* 1.49*     Results for orders placed during the hospital encounter of 04/17/13 (from the past 24 hour(s))  GLUCOSE, CAPILLARY     Status: Abnormal   Collection Time    04/17/13  5:01 PM      Result Value Range   Glucose-Capillary 133 (*) 70 - 99 mg/dL  TROPONIN I     Status: None   Collection Time    04/17/13  6:46 PM      Result Value Range   Troponin I <0.30  <0.30 ng/mL  GLUCOSE, CAPILLARY     Status: Abnormal   Collection Time    04/17/13  9:29 PM      Result Value Range   Glucose-Capillary 148 (*) 70 - 99 mg/dL  COMPREHENSIVE METABOLIC PANEL     Status: Abnormal   Collection Time    04/18/13  3:53 AM      Result Value Range   Sodium 131 (*) 137 - 147 mEq/L   Potassium 3.5 (*) 3.7 - 5.3 mEq/L   Chloride 96  96 - 112 mEq/L   CO2 21  19 - 32 mEq/L   Glucose, Bld 173 (*) 70 - 99 mg/dL   BUN 28 (*) 6 - 23 mg/dL   Creatinine, Ser 1.49 (*) 0.50 - 1.35 mg/dL   Calcium 8.7  8.4 - 10.5 mg/dL   Total Protein 6.4  6.0 - 8.3 g/dL   Albumin 2.8 (*) 3.5 - 5.2 g/dL   AST 39 (*) 0 - 37 U/L   ALT 29  0 - 53 U/L   Alkaline Phosphatase 75  39 - 117 U/L   Total Bilirubin 1.3 (*) 0.3 - 1.2 mg/dL   GFR calc non Af Amer 44 (*) >90 mL/min   GFR calc Af Amer 50 (*) >90 mL/min  CBC     Status: Abnormal   Collection Time    04/18/13  3:53 AM      Result Value Range   WBC 11.5 (*) 4.0 - 10.5 K/uL   RBC 3.42 (*) 4.22 - 5.81 MIL/uL   Hemoglobin 12.1 (*) 13.0 - 17.0 g/dL   HCT 32.9 (*) 39.0 - 52.0 %   MCV 96.2  78.0 - 100.0 fL   MCH 35.4 (*) 26.0 - 34.0 pg   MCHC 36.8 (*) 30.0 - 36.0 g/dL   RDW 12.1  11.5 - 15.5 %   Platelets 131 (*) 150 - 400 K/uL  GLUCOSE, CAPILLARY     Status: Abnormal   Collection Time    04/18/13  7:18 AM       Result Value Range   Glucose-Capillary 142 (*) 70 - 99 mg/dL  GLUCOSE, CAPILLARY     Status: Abnormal   Collection Time    04/18/13 11:40 AM      Result Value Range   Glucose-Capillary 172 (*) 70 - 99 mg/dL   Recent Results (from the past 240 hour(s))  URINE CULTURE     Status: None   Collection Time    04/17/13 10:14 AM      Result Value Range Status   Specimen Description URINE, CATHETERIZED   Final   Special Requests NONE   Final   Culture  Setup Time     Final   Value: 04/17/2013 14:13     Performed at [place]     Final  Value: >=100,000 COLONIES/ML     Performed at [facility]   Culture     Final   Value: [place]     Performed at [facility]   Report Status PENDING   Incomplete    Renal Function:  Recent Labs  04/17/13 0950 04/18/13 0353  CREATININE 1.83* 1.49*   Estimated Creatinine Clearance: 40.2 ml/min (by C-G formula based on Cr of 1.49).  Radiologic Imaging: US Abdomen Complete  04/17/2013   CLINICAL DATA:  Abnormal liver function test.  Hematuria.  EXAM: ULTRASOUND ABDOMEN COMPLETE  COMPARISON:  CT ABD-PELV WO/W CM (HEMATURIA) dated 01/31/2010  FINDINGS: Gallbladder:  Punctate echogenic focus noted in the fundus of the gallbladder. This is most likely tiny polyp. This measures 4 mm. No gallstones. Negative Murphy sign. Gallbladder wall thickness 2.1 mm. No pericholecystic fluid collections.  Common bile duct:  Diameter: 4.6 mm  Liver:  Liver is dense with coarse echotexture suggesting chronic hepatocellular disease and/or fatty infiltration.  IVC:  No abnormality visualized.  Pancreas:  Visualized portion unremarkable.  Spleen:  Size and appearance within normal limits.  Right Kidney:  Length: 12 cm. 8 mm stone right lower pole. This is nonobstructing.  Left Kidney:  Length: 5.6 cm. Mild cortical thinning. 1.3 x 0.8 x 1.2 cm cyst left upper renal pole.  Abdominal aorta:  No aneurysm visualized.  Other  findings:  None.  IMPRESSION: 1. Tiny gallbladder polyp. 2. Echo dense liver with coarse echotexture suggesting chronic hepatocellular disease and/or fatty infiltration. 3. 8 mm nonobstructing stone right kidney.   Electronically Signed   By: Marcello Moores  Register   On: 04/17/2013 18:28    Impression/Recommendation  Gross hematuria in pt with UTI and hx of radiation cystitis--continue foley and rocephin.  F/u urine culture and treat appropriately.  Hematuria likely due to infection particularly in pt with radiation cystitis.  Should clear with treatment of UTI.  Pt will need void trial prior to d/c home.  If hematuria persists, pt will need cysto which can be performed as an outpatient.  Non obstructing right renal stone---no intervention necessary at this time. Can be monitored as out pt.  Marcie Bal 04/18/2013, 3:33 PM

## 2013-04-19 LAB — URINE CULTURE: Colony Count: >=100000

## 2013-04-19 LAB — GLUCOSE, CAPILLARY
GLUCOSE-CAPILLARY: 165 mg/dL — AB (ref 70–99)
Glucose-Capillary: 116 mg/dL — ABNORMAL HIGH (ref 70–99)
Glucose-Capillary: 231 mg/dL — ABNORMAL HIGH (ref 70–99)
Glucose-Capillary: 139 mg/dL — ABNORMAL HIGH (ref 70–99)

## 2013-04-19 MED ORDER — PHENAZOPYRIDINE HCL 100 MG PO TABS
100.0000 mg | ORAL_TABLET | Freq: Three times a day (TID) | ORAL | Status: AC
  Administered 2013-04-19 – 2013-04-20 (×3): 100 mg via ORAL
  Filled 2013-04-19 (×3): qty 1

## 2013-04-19 MED ORDER — TRAMADOL HCL 50 MG PO TABS
50.0000 mg | ORAL_TABLET | Freq: Four times a day (QID) | ORAL | Status: DC | PRN
  Filled 2013-04-19: qty 2

## 2013-04-19 MED ORDER — CIPROFLOXACIN HCL 500 MG PO TABS
500.0000 mg | ORAL_TABLET | Freq: Two times a day (BID) | ORAL | Status: DC
  Administered 2013-04-19 – 2013-04-20 (×2): 500 mg via ORAL
  Filled 2013-04-19 (×4): qty 1

## 2013-04-19 NOTE — Progress Notes (Signed)
PROGRESS NOTE  Daniel [name] [id number] DOB: [birth year] DOA: 04/17/2013 PCP: Tommy Medal, MD  HPI: 80 year old male with a history of prostate cancer, diabetes mellitus, hypertension, alcohol dependence presents with a three-day history of near syncope. The patient has a chronic history of dizziness which appears to be from orthostasis from his history. The patient was diagnosed with gross hematuria likely from UTI and history of radiation cystitis. The patient was started on empiric ceftriaxone. IV fluids were started for acute on chronic renal failure likely due to volume depletion. The patient's hematuria improved on antibiotics and with hydration. Urology was consulted for gross hematuria. CIWA started for hx of alcohol dependence.  Assessment/Plan: Acute on chronic renal failure  - Secondary to dehydration  - Baseline creatinine 1.0-1.1  - IV fluids, Cr improving.  - No hydronephrosis on abdominal ultrasound  UTI/gross hematuria with hx radiation cystitis  - appreciate Dr. Gaynelle Arabian consult  - Urine culture + for pseudomonas, narrow to Cipro per sstv - Hold aspirin  - remove foley today, voiding trial Near syncope  - Likely due to volume depletion  - Orthostatic vital signs--neg  - Echocardiogram--grade 1 diastolic dysfxn, no EF noted  - Telemetry  Alcohol dependence  - Alcohol withdrawal protocol  - Last drink on the evening of 04/16/2013  - mildly tremulous, improving. Had some hallucinations overnight. Downplays his ETOH use. Not willing to quit.  Transaminasemia with hyperbilirubinemia  - abdominal us--echodense liver, nonobstructive R-renal stone  - likely due to Etoh liver disease  Diabetes mellitus type 2  - Will give half home dose of Levemir--CBGs remain controlled   -NovoLog sliding scale  - Hemoglobin A1c--6.5  Hyponatremia  - Likely due to the patient's liver cirrhosis. Stable    Diet: carb modified Fluids: NS DVT Prophylaxis: SCD  Code Status:  Full Family Communication: wife bedside  Disposition Plan: home in am  Consultants:  None   Procedures:  None    Antibiotics Ceftriaxone 1/26 >>1/28 Ciprofloxacin 1/28 >>  HPI/Subjective: - no complaints, insists on going home as soon as possible.   Objective: Filed Vitals:   04/18/13 1406 04/18/13 2154 04/19/13 0635 04/19/13 1422  BP: 128/66 142/61 148/70 123/60  Pulse: 86 95 91 86  Temp: 98 F (36.7 C) 98.2 F (36.8 C) 98 F (36.7 C) 98.5 F (36.9 C)  TempSrc: Oral Oral Oral Oral  Resp: 18 18 16 18   Height:      Weight:      SpO2: 100% 97% 98% 100%    Intake/Output Summary (Last 24 hours) at 04/19/13 1502 Last data filed at 04/19/13 1422  Gross per 24 hour  Intake   2280 ml  Output   1300 ml  Net    980 ml   Filed Weights   04/17/13 1700  Weight: 76.3 kg (168 lb 3.4 oz)    Exam:   General:  NAD  Cardiovascular: regular rate and rhythm, without MRG  Respiratory: good air movement, clear to auscultation throughout, no wheezing, ronchi or rales  Abdomen: soft, not tender to palpation, positive bowel sounds  MSK: no peripheral edema  Neuro: non focal  Data Reviewed: Basic Metabolic Panel:  Recent Labs Lab 04/17/13 0950 04/18/13 0353  NA 133* 131*  K 4.5 3.5*  CL 91* 96  CO2 23 21  GLUCOSE 147* 173*  BUN 27* 28*  CREATININE 1.83* 1.49*  CALCIUM 9.7 8.7   Liver Function Tests:  Recent Labs Lab 04/17/13 0950 04/18/13 0353  AST 54* 39*  ALT 38 29  ALKPHOS 90 75  BILITOT 1.8* 1.3*  PROT 7.8 6.4  ALBUMIN 3.5 2.8*   CBC:  Recent Labs Lab 04/17/13 0950 04/18/13 0353  WBC 10.9* 11.5*  NEUTROABS 8.2*  --   HGB 14.5 12.1*  HCT 39.5 32.9*  MCV 97.8 96.2  PLT 154 131*   Cardiac Enzymes:  Recent Labs Lab 04/17/13 1310 04/17/13 1846  TROPONINI <0.30 <0.30   CBG:  Recent Labs Lab 04/18/13 1140 04/18/13 1718 04/18/13 2204 04/19/13 0742 04/19/13 1135  GLUCAP 172* 216* 203* 116* 165*    Recent Results (from the  past 240 hour(s))  URINE CULTURE     Status: None   Collection Time    04/17/13 10:14 AM      Result Value Range Status   Specimen Description URINE, CATHETERIZED   Final   Special Requests NONE   Final   Culture  Setup Time     Final   Value: 04/17/2013 14:13     Performed at [place]     Final   Value: >=100,000 COLONIES/ML     Performed at [facility]   Culture     Final   Value: PSEUDOMONAS AERUGINOSA     Performed at [facility]   Report Status 04/19/2013 FINAL   Final   Organism ID, Bacteria PSEUDOMONAS AERUGINOSA   Final     Studies: US Abdomen Complete  04/17/2013   CLINICAL DATA:  Abnormal liver function test.  Hematuria.  EXAM: ULTRASOUND ABDOMEN COMPLETE  COMPARISON:  CT ABD-PELV WO/W CM (HEMATURIA) dated 01/31/2010  FINDINGS: Gallbladder:  Punctate echogenic focus noted in the fundus of the gallbladder. This is most likely tiny polyp. This measures 4 mm. No gallstones. Negative Murphy sign. Gallbladder wall thickness 2.1 mm. No pericholecystic fluid collections.  Common bile duct:  Diameter: 4.6 mm  Liver:  Liver is dense with coarse echotexture suggesting chronic hepatocellular disease and/or fatty infiltration.  IVC:  No abnormality visualized.  Pancreas:  Visualized portion unremarkable.  Spleen:  Size and appearance within normal limits.  Right Kidney:  Length: 12 cm. 8 mm stone right lower pole. This is nonobstructing.  Left Kidney:  Length: 5.6 cm. Mild cortical thinning. 1.3 x 0.8 x 1.2 cm cyst left upper renal pole.  Abdominal aorta:  No aneurysm visualized.  Other findings:  None.  IMPRESSION: 1. Tiny gallbladder polyp. 2. Echo dense liver with coarse echotexture suggesting chronic hepatocellular disease and/or fatty infiltration. 3. 8 mm nonobstructing stone right kidney.   Electronically Signed   By: Marcello Moores  Register   On: 04/17/2013 18:28    Scheduled Meds: . atorvastatin  10 mg Oral q1800  . cholecalciferol  5,000 Units  Oral Daily  . ciprofloxacin  500 mg Oral BID  . citalopram  10 mg Oral Daily  . finasteride  5 mg Oral Daily  . folic acid  1 mg Oral Daily  . insulin aspart  0-5 Units Subcutaneous QHS  . insulin aspart  0-9 Units Subcutaneous TID WC  . insulin detemir  20 Units Subcutaneous QHS  . multivitamin with minerals  1 tablet Oral Daily  . sodium chloride  3 mL Intravenous Q12H  . thiamine  100 mg Oral Daily   Or  . thiamine  100 mg Intravenous Daily  . vitamin C  500 mg Oral Daily   Continuous Infusions: . sodium chloride 75 mL/hr at 04/19/13 1152    Active Problems:   Acute  on chronic renal failure   Dehydration   Alcohol dependence   Transaminasemia   DM2 (diabetes mellitus, type 2)   Near syncope   UTI (lower urinary tract infection)   Hematuria   Time spent: [place], MD [facility] Pager [phone]. If 7 PM - 7 AM, please contact night-coverage at [email or web address], password [facility] 04/19/2013, 3:02 PM  LOS: 2 days

## 2013-04-19 NOTE — Progress Notes (Signed)
  Subjective: The patient reports  Clear urine in foley today. Feels much better. Wants to go home. ( ? Tomorrow).   Objective: Vital signs in last 24 hours: Temp:  [98 F (36.7 C)-98.2 F (36.8 C)] 98 F (36.7 C) (01/28 0635) Pulse Rate:  [86-95] 91 (01/28 0635) Resp:  [16-18] 16 (01/28 0635) BP: (128-148)/(61-70) 148/70 mmHg (01/28 0635) SpO2:  [97 %-100 %] 98 % (01/28 0635)A  Intake/Output from previous day: 01/27 0701 - 01/28 0700 In: 2040 [P.O.:240; I.V.:1800] Out: 900 [Urine:900] Intake/Output this shift: Total I/O In: -  Out: 300 [Urine:300]  Past Medical History  Diagnosis Date  . Cancer     prostate  . Alcohol abuse   . Hypertension   . Hyperlipidemia   . Complication of anesthesia     Hx of seizure when put to sleep for surgery on 08/2009  . Coronary artery disease   . Peripheral neuropathy   . CHF (congestive heart failure)   . H/O hiatal hernia   . Seizures   . Constipation   . Urinary incontinence   . Cardiomyopathy   . Diabetes mellitus without complication     Physical Exam:  Lungs - Normal respiratory effort, chest expands symmetrically.  Abdomen - Soft, non-tender & non-distended.  Lab Results:  Recent Labs  04/17/13 0950 04/18/13 0353  WBC 10.9* 11.5*  HGB 14.5 12.1*  HCT 39.5 32.9*   BMET  Recent Labs  04/17/13 0950 04/18/13 0353  NA 133* 131*  K 4.5 3.5*  CL 91* 96  CO2 23 21  GLUCOSE 147* 173*  BUN 27* 28*  CREATININE 1.83* 1.49*  CALCIUM 9.7 8.7   No results found for this basename: LABURIN,  in the last 72 hours Results for orders placed during the hospital encounter of 04/17/13  URINE CULTURE     Status: None   Collection Time    04/17/13 10:14 AM      Result Value Range Status   Specimen Description URINE, CATHETERIZED   Final   Special Requests NONE   Final   Culture  Setup Time     Final   Value: 04/17/2013 14:13     Performed at [facility]     Final   Value: >=100,000 COLONIES/ML     Performed at [facility]   Culture     Final   Value: PSEUDOMONAS AERUGINOSA     Performed at [facility]   Report Status 04/19/2013 FINAL   Final   Organism ID, Bacteria PSEUDOMONAS AERUGINOSA   Final    Studies/Results:   Assessment: Radiation cystitis and hemorrhagic bacterial cystitis, now with clear urine since last evening. He may be ready for d/c, but should go ahead and d/c foley and void prior to d/c.  Plan: OK from Urology standpoint for foley removal.  RTC [id number]) for follow-up in 4-6 weeks.  Continue pseudomonas UTI Rx.  Debe Anfinson I 04/19/2013, 10:00 AM

## 2013-04-20 LAB — BASIC METABOLIC PANEL
BUN: 21 mg/dL (ref 6–23)
CO2: 23 mEq/L (ref 19–32)
Calcium: 8.4 mg/dL (ref 8.4–10.5)
Chloride: 97 mEq/L (ref 96–112)
Creatinine, Ser: 1.2 mg/dL (ref 0.50–1.35)
GFR, EST AFRICAN AMERICAN: 66 mL/min — AB (ref >90)
GFR, EST NON AFRICAN AMERICAN: 57 mL/min — AB (ref >90)
GLUCOSE: 125 mg/dL — AB (ref 70–99)
POTASSIUM: 3.6 meq/L — AB (ref 3.7–5.3)
Sodium: 132 mEq/L — ABNORMAL LOW (ref 137–147)

## 2013-04-20 LAB — CBC
HEMATOCRIT: 32.5 % — AB (ref 39.0–52.0)
Hemoglobin: 11.7 g/dL — ABNORMAL LOW (ref 13.0–17.0)
MCH: 34.7 pg — AB (ref 26.0–34.0)
MCHC: 36 g/dL (ref 30.0–36.0)
MCV: 96.4 fL (ref 78.0–100.0)
Platelets: 144 10*3/uL — ABNORMAL LOW (ref 150–400)
RBC: 3.37 MIL/uL — ABNORMAL LOW (ref 4.22–5.81)
RDW: 12 % (ref 11.5–15.5)
WBC: 9.3 10*3/uL (ref 4.0–10.5)

## 2013-04-20 LAB — GLUCOSE, CAPILLARY
GLUCOSE-CAPILLARY: 171 mg/dL — AB (ref 70–99)
Glucose-Capillary: 123 mg/dL — ABNORMAL HIGH (ref 70–99)

## 2013-04-20 MED ORDER — CIPROFLOXACIN HCL 500 MG PO TABS: 500.0000 mg | ORAL_TABLET | Freq: Two times a day (BID) | ORAL | Status: DC

## 2013-04-20 MED ORDER — LORAZEPAM 0.5 MG PO TABS: 0.5000 mg | ORAL_TABLET | Freq: Three times a day (TID) | ORAL | Status: DC

## 2013-04-20 NOTE — Evaluation (Signed)
Physical Therapy Evaluation Patient Details Name: Daniel [name] MRN: [id number] DOB: [birth year] Today's Date: 04/20/2013 Time: 2536-6440 PT Time Calculation (min): 18 min  PT Assessment / Plan / Recommendation History of Present Illness  80 yo male admitted with acute on chronic renal failure, dizziness, hypotension, UTI, hematuria. Hx of prostate cancer, DM, HTN,ETOH dependence.   Clinical Impression  On eval, pt required Min assist for mobility-able to ambulate ~60 feet with walker. Demonstrates general weakness, decreased activity tolerance, and impaired gait and balance. Recommend HHPT, HHOT at discharge to maximize independence and safety with functional mobility in home environment.     PT Assessment  All further PT needs can be met in the next venue of care    Follow Up Recommendations  Home health PT; Home Health OT; Supervision/Assistance - 24 hour    Does the patient have the potential to tolerate intense rehabilitation      Barriers to Discharge        Equipment Recommendations  None recommended by PT    Recommendations for Other Services     Frequency Min 3X/week    Precautions / Restrictions Precautions Precautions: Fall Restrictions Weight Bearing Restrictions: No   Pertinent Vitals/Pain Pt denies pain      Mobility  Bed Mobility Overal bed mobility: Needs Assistance Bed Mobility: Supine to Sit;Sit to Supine Supine to sit: Supervision Sit to supine: Supervision Transfers Overall transfer level: Needs assistance Equipment used: Rolling walker (2 wheeled) Transfers: Sit to/from Stand Sit to Stand: Min assist General transfer comment: assist to rise, stabilize, control descent. VCs safety, technique, hand placement Ambulation/Gait Ambulation/Gait assistance: Min assist Ambulation Distance (Feet): 60 Feet Assistive device: Rolling walker (2 wheeled) Gait Pattern/deviations: Step-through pattern;Decreased stride length;Trunk flexed General Gait  Details: assist to stabilize throughout ambulation. Pt c/o mild dizziness/lightheadedness. fatigues easily.     Exercises     PT Diagnosis: Difficulty walking;Generalized weakness  PT Problem List: Decreased strength;Decreased activity tolerance;Decreased balance;Decreased mobility PT Treatment Interventions: Therapeutic exercise     PT Goals(Current goals can be found in the care plan section) Acute Rehab PT Goals Patient Stated Goal: home today PT Goal Formulation: No goals set, d/c therapy  Visit Information  Last PT Received On: 04/20/13 Assistance Needed: +1 History of Present Illness: 80 yo male admitted with acute on chronic renal failure, dizziness, hypotension, UTI, hematuria. Hx of prostate cancer, DM, HTN,ETOH dependence.        Prior [place] expects to be discharged to:: Private residence Living Arrangements: Spouse/significant other Type of Home: House Home Access: Stairs to enter [employer] of Steps: 1 step through garage [place]: Two level Alternate Level Stairs-Number of Steps: stair lift Home Equipment: [place] - 2 wheels;Wheelchair - Brewing technologist Prior Function Level of Independence: Needs assistance Gait / Transfers Assistance Needed: wife assists pt into shower Communication Communication: No difficulties    Cognition  Cognition Arousal/Alertness: Awake/alert Behavior During Therapy: WFL for tasks assessed/performed Overall Cognitive Status: Within Functional Limits for tasks assessed    Extremity/Trunk Assessment Upper Extremity Assessment Upper Extremity Assessment: Generalized weakness Lower Extremity Assessment Lower Extremity Assessment: Generalized weakness Cervical / Trunk Assessment Cervical / Trunk Assessment: Kyphotic   Balance    End of Session PT - End of Session Equipment Utilized During Treatment: Gait belt Activity Tolerance: Patient limited by fatigue Patient left: in bed;with  call bell/phone within reach;with family/visitor present  GP     Weston Anna, MPT Pager: [phone]

## 2013-04-20 NOTE — Progress Notes (Signed)
Pt and wife selected [facility] for [facility]. Referral given to in house rep for [facility]

## 2013-04-20 NOTE — Discharge Instructions (Signed)

## 2013-04-20 NOTE — Discharge Summary (Signed)
Physician Discharge Summary  Daniel [name] [id number] DOB: 1934-01-07 DOA: 04/17/2013  PCP: Tommy Medal, MD  Admit date: 04/17/2013 Discharge date: 04/20/2013  Time spent: 35 minutes  Recommendations for Outpatient Follow-up:  1. Follow up with PCP in 1-2 weeks 2. Follow up with Urology as an outpatient  Recommendations for primary care physician for things to follow:  Repeat BMP  Discharge Diagnoses:  Active Problems:   Acute on chronic renal failure   Dehydration   Alcohol dependence   Transaminasemia   DM2 (diabetes mellitus, type 2)   Near syncope   UTI (lower urinary tract infection)   Hematuria  Discharge Condition: stable  Diet recommendation: regular  Filed Weights   04/17/13 1700  Weight: 76.3 kg (168 lb 3.4 oz)   History of present illness:  80 year old male with a history of prostate cancer, diabetes mellitus, hypertension, alcohol dependence presents with a three-day history of near syncope. The patient has a chronic history of dizziness which appears to be from orthostasis from his history. The patient's wife is at the bedside to supplement the history. However, in the past 3 days it has worsened. The patient endorses decreased by mouth intake, but denies any vomiting or diarrhea. 3 days ago, the patient had a near syncopal episode walking back to his bed. He did not have any chest discomfort, shortness of breath, or palpitations but just felt lightheaded. Another similar episode happened on the morning of admission. 24 hours prior to admission, the patient complained of hematuria with suprapubic pain and dysuria. He also complained of urinating some blood clots. Upon arrival to the ED, Urology, Dr. Carole Binning was contacted. A foley was placed in the ED when bladder scan noted 240cc of fluid. The patient denies fevers, chills, chest discomfort, headache, vomiting, diarrhea, abdominal pain, hematochezia, melena. The patient has not been started on any new  medications the past month. In fact, his primary care provider has told him to stop all his antihypertensive medications due to his orthostasis. He has not been on any antihypertensive medications for approximately one year. The patient's wife states that he drinks at least 1/4 of a fifth of vodka daily for over 20 yrs. Per EMS, pt had systolic BP in [id number] which improved with 500cc NS. In the ED, the patient is hemodynamically stable. Sodium 133, serum creatinine 1.3, AST 54, ALT 38, phosphatase 90, total bilirubin 1.8. WBC was 10.9. Platelets 154,000. Urinalysis showed TNTC WBC and RBC.  Hospital Course:  Acute on chronic renal failure -Secondary to dehydration, Baseline creatinine 1.0-1.1, at baseline on discharge Hematuria/dysuria/pyuria -pt normally sees Dr. Gaynelle Arabian and he evaluated patient while hospitalized, appreciate input. Thought to be radiation cystitis and hemorrhagic bacterial cystitis, improved after antibiotics initiation. Urine was clear, foley was discontinued and patient able to void well on his own. He is to have outpatient follow up.  UTI - initially on Ceftriaxone, cultures positive for Pseudomonas sensitive to Ciprofloxacin, to complete an additional 7 days following discharge.  Near syncope -Likely due to volume depletion, ETOH abuse at home.  Alcohol dependence - Alcohol withdrawal protocol, mild confusion 1/27 overnight, resolved prior to discharge. Counseled for cessation.  Transaminasemia with hyperbilirubinemia -abdominal US, likely due to Etoh liver disease  Diabetes mellitus type 2 - to resume home medications on discharge  Procedures:  2D echo Left ventricle: Difficult acoustic windows. Endocardium is not well seen in all walls. Recomm limited examination with echo contrast to define wall motion. The cavity size was normal. Wall thickness was  normal. Doppler parameters are consistent with abnormal left ventricular relaxation (grade 1 diastolic dysfunction).    Consultations:  Urology   Discharge Exam: Filed Vitals:   04/19/13 1422 04/20/13 0031 04/20/13 0630 04/20/13 0900  BP: 123/60 118/58 130/67 113/57  Pulse: 86 81 79 80  Temp: 98.5 F (36.9 C) 98.5 F (36.9 C) 97.8 F (36.6 C) 98.1 F (36.7 C)  TempSrc: Oral Oral Oral Oral  Resp: 18 20 20 16   Height:    5\' 8"  (1.727 m)  Weight:      SpO2: 100% 99% 97% 99%   General: NAD Cardiovascular: RRR Respiratory: CTA biL  Discharge Instructions   Future Appointments Provider Department Dept Phone   05/25/2013 9:45 AM [place], [place] at [facility]       Medication List         aspirin 81 MG tablet  Take 81 mg by mouth daily.     ciprofloxacin 500 MG tablet  Commonly known as:  CIPRO  Take 1 tablet (500 mg total) by mouth 2 (two) times daily.     citalopram 10 MG/5ML suspension  Commonly known as:  CELEXA  Take by mouth daily.     D-3-5 5000 UNITS capsule  Generic drug:  Cholecalciferol  Take 5,000 Units by mouth daily.     finasteride 5 MG tablet  Commonly known as:  PROSCAR     folic acid 1 MG tablet  Commonly known as:  FOLVITE  Take 1 mg by mouth daily.     insulin lispro 100 UNIT/ML injection  Commonly known as:  HUMALOG  Inject 5-10 Units into the skin at bedtime.     LEVEMIR FLEXTOUCH 100 UNIT/ML Pen  Generic drug:  Insulin Detemir  Inject 48 Units into the skin daily.     LORazepam 0.5 MG tablet  Commonly known as:  ATIVAN  Take 1 tablet (0.5 mg total) by mouth every 8 (eight) hours.     multivitamin tablet  Take 1 tablet by mouth daily.     potassium & sodium phosphates 280-160-250 MG Pack  Commonly known as:  PHOS-NAK  Take 1 packet by mouth 4 (four) times daily -  with meals and at bedtime.     potassium chloride 10 MEQ tablet  Commonly known as:  K-DUR  Take 10 mEq by mouth daily.     rosuvastatin 5 MG tablet  Commonly known as:  CRESTOR  Take 5 mg by mouth daily.     SUPER B COMPLEX PO  Take 1 tablet  by mouth daily.     trimethoprim 100 MG tablet  Commonly known as:  TRIMPEX     vitamin C 500 MG tablet  Commonly known as:  ASCORBIC ACID  Take 500 mg by mouth daily.           Follow-up Information   Follow up with Carolan Clines I, MD. Schedule an appointment as soon as possible for a visit in 6 weeks.   Specialty:  Urology   Contact information:   [facility]  [place] [address] [phone]       The results of significant diagnostics from this hospitalization (including imaging, microbiology, ancillary and laboratory) are listed below for reference.    Significant Diagnostic Studies: US Abdomen Complete  04/17/2013   CLINICAL DATA:  Abnormal liver function test.  Hematuria.  EXAM: ULTRASOUND ABDOMEN COMPLETE  COMPARISON:  CT ABD-PELV WO/W CM (HEMATURIA) dated 01/31/2010  FINDINGS: Gallbladder:  Punctate echogenic focus noted in the fundus of the gallbladder. This is most likely tiny polyp. This measures 4 mm. No gallstones. Negative Murphy sign. Gallbladder wall thickness 2.1 mm. No pericholecystic fluid collections.  Common bile duct:  Diameter: 4.6 mm  Liver:  Liver is dense with coarse echotexture suggesting chronic hepatocellular disease and/or fatty infiltration.  IVC:  No abnormality visualized.  Pancreas:  Visualized portion unremarkable.  Spleen:  Size and appearance within normal limits.  Right Kidney:  Length: 12 cm. 8 mm stone right lower pole. This is nonobstructing.  Left Kidney:  Length: 5.6 cm. Mild cortical thinning. 1.3 x 0.8 x 1.2 cm cyst left upper renal pole.  Abdominal aorta:  No aneurysm visualized.  Other findings:  None.  IMPRESSION: 1. Tiny gallbladder polyp. 2. Echo dense liver with coarse echotexture suggesting chronic hepatocellular disease and/or fatty infiltration. 3. 8 mm nonobstructing stone right kidney.   Electronically Signed   By: Marcello Moores  Register   On: 04/17/2013 18:28    Microbiology: Recent  Results (from the past 240 hour(s))  URINE CULTURE     Status: None   Collection Time    04/17/13 10:14 AM      Result Value Range Status   Specimen Description URINE, CATHETERIZED   Final   Special Requests NONE   Final   Culture  Setup Time     Final   Value: 04/17/2013 14:13     Performed at [facility]     Final   Value: >=100,000 COLONIES/ML     Performed at [facility]   Culture     Final   Value: PSEUDOMONAS AERUGINOSA     Performed at [facility]   Report Status 04/19/2013 FINAL   Final   Organism ID, Bacteria PSEUDOMONAS AERUGINOSA   Final     Labs: Basic Metabolic Panel:  Recent Labs Lab 04/17/13 0950 04/18/13 0353 04/20/13 0413  NA 133* 131* 132*  K 4.5 3.5* 3.6*  CL 91* 96 97  CO2 23 21 23   GLUCOSE 147* 173* 125*  BUN 27* 28* 21  CREATININE 1.83* 1.49* 1.20  CALCIUM 9.7 8.7 8.4   Liver Function Tests:  Recent Labs Lab 04/17/13 0950 04/18/13 0353  AST 54* 39*  ALT 38 29  ALKPHOS 90 75  BILITOT 1.8* 1.3*  PROT 7.8 6.4  ALBUMIN 3.5 2.8*   CBC:  Recent Labs Lab 04/17/13 0950 04/18/13 0353 04/20/13 0413  WBC 10.9* 11.5* 9.3  NEUTROABS 8.2*  --   --   HGB 14.5 12.1* 11.7*  HCT 39.5 32.9* 32.5*  MCV 97.8 96.2 96.4  PLT 154 131* 144*   Cardiac Enzymes:  Recent Labs Lab 04/17/13 1310 04/17/13 1846  TROPONINI <0.30 <0.30   CBG:  Recent Labs Lab 04/19/13 1135 04/19/13 1740 04/19/13 2103 04/20/13 0738 04/20/13 1147  GLUCAP 165* 231* 139* 123* 171*    Signed:  Teira Arcilla  [facility] 04/20/2013, 3:44 PM

## 2013-05-25 ENCOUNTER — Ambulatory Visit (INDEPENDENT_AMBULATORY_CARE_PROVIDER_SITE_OTHER): Payer: Medicare Other | Admitting: Podiatry

## 2013-05-25 ENCOUNTER — Encounter: Payer: Self-pay | Admitting: Podiatry

## 2013-05-25 VITALS — BP 108/61 | HR 87 | Resp 18

## 2013-05-25 DIAGNOSIS — M79609 Pain in unspecified limb: Secondary | ICD-10-CM

## 2013-05-25 DIAGNOSIS — B351 Tinea unguium: Secondary | ICD-10-CM

## 2013-05-25 NOTE — Progress Notes (Signed)
Trim toenails and calluses.  Objective: Vital signs are stable he is alert and oriented x3. Pulses are palpable bilateral. Nails are thick yellow dystrophic clinically mycotic and painful palpation.  Assessment: Pain in limb secondary to onychomycosis 1 through 5 bilateral.  Plan: Debridement nails 1 through 5 bilateral is cover service secondary to pain.

## 2013-08-24 ENCOUNTER — Ambulatory Visit: Payer: Medicare Other | Admitting: Podiatry

## 2013-08-31 ENCOUNTER — Encounter: Payer: Self-pay | Admitting: Podiatry

## 2013-08-31 ENCOUNTER — Ambulatory Visit (INDEPENDENT_AMBULATORY_CARE_PROVIDER_SITE_OTHER): Payer: Medicare Other | Admitting: Podiatry

## 2013-08-31 VITALS — BP 122/67 | HR 76 | Resp 17 | Ht 68.0 in | Wt 169.0 lb

## 2013-08-31 DIAGNOSIS — M79609 Pain in unspecified limb: Secondary | ICD-10-CM

## 2013-08-31 DIAGNOSIS — B351 Tinea unguium: Secondary | ICD-10-CM

## 2013-08-31 NOTE — Progress Notes (Signed)
   Subjective:    Patient ID: Daniel [name], male    DOB: [birth year], 80 y.o.   MRN: [id number]  HPI Comments: Pt's wife states she found a wound on Left 1st MPJ plantar foot, denies any drainage at any time over the week.  Dr. Minna Antis prescribed Avalox for 7 days, and pt's wife covered with a dry bandaid.      Review of Systems     Objective:   Physical Exam: Pulses are palpable nails are thick yellow dystrophic with mycotic painful palpation.        Assessment & Plan:  Assessment: Pain in limb secondary to onychomycosis 1 through 5 bilateral.  Plan: Debridement of nails 1 through 5 bilateral covered service secondary to pain.

## 2013-11-02 ENCOUNTER — Encounter: Payer: Self-pay | Admitting: Podiatry

## 2013-11-02 ENCOUNTER — Ambulatory Visit (INDEPENDENT_AMBULATORY_CARE_PROVIDER_SITE_OTHER): Payer: Medicare Other | Admitting: Podiatry

## 2013-11-02 VITALS — BP 116/60 | HR 84 | Resp 12

## 2013-11-02 DIAGNOSIS — M79609 Pain in unspecified limb: Secondary | ICD-10-CM

## 2013-11-02 DIAGNOSIS — Q828 Other specified congenital malformations of skin: Secondary | ICD-10-CM

## 2013-11-02 DIAGNOSIS — M79676 Pain in unspecified toe(s): Secondary | ICD-10-CM

## 2013-11-02 DIAGNOSIS — E1149 Type 2 diabetes mellitus with other diabetic neurological complication: Secondary | ICD-10-CM

## 2013-11-02 DIAGNOSIS — B351 Tinea unguium: Secondary | ICD-10-CM

## 2013-11-03 NOTE — Progress Notes (Signed)
He presents today with a chief complaint of painful elongated toenails one through 5 bilateral.  Objective: Nails are thick yellow dystrophic onychomycotic and painful palpation as well as reactive hyperkeratosis to the plantar aspect the bilateral foot. Pulses remain palpable.  Assessment: Pain in limb secondary to onychomycosis and porokeratotic lesions.  Plan: Debridement nails and reactive hyperkeratotic lesions today as a covered service secondary to pain.

## 2014-01-05 ENCOUNTER — Other Ambulatory Visit: Payer: Self-pay

## 2014-01-30 ENCOUNTER — Ambulatory Visit (INDEPENDENT_AMBULATORY_CARE_PROVIDER_SITE_OTHER): Payer: Medicare Other | Admitting: Podiatry

## 2014-01-30 DIAGNOSIS — B351 Tinea unguium: Secondary | ICD-10-CM

## 2014-01-30 DIAGNOSIS — M79676 Pain in unspecified toe(s): Secondary | ICD-10-CM

## 2014-01-30 DIAGNOSIS — E119 Type 2 diabetes mellitus without complications: Secondary | ICD-10-CM

## 2014-01-30 DIAGNOSIS — Q828 Other specified congenital malformations of skin: Secondary | ICD-10-CM

## 2014-01-30 NOTE — Progress Notes (Signed)
Presents today chief complaint of painful elongated toenails.  Objective: Pulses are palpable bilateral nails are thick, yellow dystrophic onychomycosis and painful palpation.   Assessment: Onychomycosis with pain in limb.  Plan: Treatment of nails in thickness and length as covered service secondary to pain.  

## 2014-05-01 ENCOUNTER — Ambulatory Visit: Payer: Medicare Other | Admitting: Podiatry

## 2014-05-16 ENCOUNTER — Ambulatory Visit: Payer: Self-pay | Admitting: Podiatry

## 2014-05-25 ENCOUNTER — Ambulatory Visit (INDEPENDENT_AMBULATORY_CARE_PROVIDER_SITE_OTHER): Payer: Medicare Other | Admitting: Podiatry

## 2014-05-25 DIAGNOSIS — M79676 Pain in unspecified toe(s): Secondary | ICD-10-CM

## 2014-05-25 DIAGNOSIS — Q828 Other specified congenital malformations of skin: Secondary | ICD-10-CM | POA: Diagnosis not present

## 2014-05-25 DIAGNOSIS — E1140 Type 2 diabetes mellitus with diabetic neuropathy, unspecified: Secondary | ICD-10-CM | POA: Diagnosis not present

## 2014-05-25 DIAGNOSIS — E1149 Type 2 diabetes mellitus with other diabetic neurological complication: Secondary | ICD-10-CM

## 2014-05-25 DIAGNOSIS — B351 Tinea unguium: Secondary | ICD-10-CM

## 2014-05-25 NOTE — Patient Instructions (Signed)
Diabetes and Foot Care Diabetes may cause you to have problems because of poor blood supply (circulation) to your feet and legs. This may cause the skin on your feet to become thinner, break easier, and heal more slowly. Your skin may become dry, and the skin may peel and crack. You may also have nerve damage in your legs and feet causing decreased feeling in them. You may not notice minor injuries to your feet that could lead to infections or more serious problems. Taking care of your feet is one of the most important things you can do for yourself.  HOME CARE INSTRUCTIONS  Wear shoes at all times, even in the house. Do not go barefoot. Bare feet are easily injured.  Check your feet daily for blisters, cuts, and redness. If you cannot see the bottom of your feet, use a mirror or ask someone for help.  Wash your feet with warm water (do not use hot water) and mild soap. Then pat your feet and the areas between your toes until they are completely dry. Do not soak your feet as this can dry your skin.  Apply a moisturizing lotion or petroleum jelly (that does not contain alcohol and is unscented) to the skin on your feet and to dry, brittle toenails. Do not apply lotion between your toes.  Trim your toenails straight across. Do not dig under them or around the cuticle. File the edges of your nails with an emery board or nail file.  Do not cut corns or calluses or try to remove them with medicine.  Wear clean socks or stockings every day. Make sure they are not too tight. Do not wear knee-high stockings since they may decrease blood flow to your legs.  Wear shoes that fit properly and have enough cushioning. To break in new shoes, wear them for just a few hours a day. This prevents you from injuring your feet. Always look in your shoes before you put them on to be sure there are no objects inside.  Do not cross your legs. This may decrease the blood flow to your feet.  If you find a minor scrape,  cut, or break in the skin on your feet, keep it and the skin around it clean and dry. These areas may be cleansed with mild soap and water. Do not cleanse the area with peroxide, alcohol, or iodine.  When you remove an adhesive bandage, be sure not to damage the skin around it.  If you have a wound, look at it several times a day to make sure it is healing.  Do not use heating pads or hot water bottles. They may burn your skin. If you have lost feeling in your feet or legs, you may not know it is happening until it is too late.  Make sure your health care provider performs a complete foot exam at least annually or more often if you have foot problems. Report any cuts, sores, or bruises to your health care provider immediately. SEEK MEDICAL CARE IF:   You have an injury that is not healing.  You have cuts or breaks in the skin.  You have an ingrown nail.  You notice redness on your legs or feet.  You feel burning or tingling in your legs or feet.  You have pain or cramps in your legs and feet.  Your legs or feet are numb.  Your feet always feel cold. SEEK IMMEDIATE MEDICAL CARE IF:   There is increasing redness,   swelling, or pain in or around a wound.  There is a red line that goes up your leg.  Pus is coming from a wound.  You develop a fever or as directed by your health care provider.  You notice a bad smell coming from an ulcer or wound. Document Released: 03/06/2000 Document Revised: 11/09/2012 Document Reviewed: 08/16/2012 ExitCare Patient Information 2015 [employer]. This information is not intended to replace advice given to you by your health care provider. Make sure you discuss any questions you have with your health care provider.  

## 2014-05-27 NOTE — Progress Notes (Signed)
Patient ID: JAYVIER BLAKELEY, male   DOB: 1933/07/08, 81 y.o.   MRN: [id number]  Subjective: 81 y.o.-year-old male  returns the office today for painful, elongated, thickened toenails. Denies any redness or drainage around the nails. Also reports painful calluses to his feet. Denies any acute changes since last appointment and no new complaints today. Denies any systemic complaints such as fevers, chills, nausea, vomiting.   Objective: AAO 3, NAD DP/PT pulses palpable, CRT less than 3 seconds Protective sensation decreased with Simms Weinstein monofilament, Achilles tendon reflex intact.  Nails hypertrophic, dystrophic, elongated, brittle, discolored 10. There is tenderness overlying these nails. There is no surrounding erythema or drainage along the nail sites. Hyperkerotic lesions bilateral submetatarsal 1. Upon debridement of the lesions, no underlying ulceration, drainage, or other signs of infection.  No open lesions or other pre-ulcerative lesions are identified. No other areas of tenderness bilateral lower extremities. No overlying edema, erythema, increased warmth. No pain with calf compression, swelling, warmth, erythema.  Assessment: Patient presents with symptomatic onychomycosis  Plan: -Treatment options including alternatives, risks, complications were discussed -Nails sharply debrided 10 without complication/bleeding. -Hyperkerotic lesions sharply debrided x 2 without complications/bleeding.  -Discussed daily foot inspection. If there are any changes, to call the office immediately.  -Follow-up in 3 months or sooner if any problems are to arise. In the meantime, encouraged to call the office with any questions, concerns, changes symptoms.

## 2014-08-22 ENCOUNTER — Ambulatory Visit (INDEPENDENT_AMBULATORY_CARE_PROVIDER_SITE_OTHER): Payer: Medicare Other | Admitting: Podiatry

## 2014-08-22 ENCOUNTER — Encounter: Payer: Self-pay | Admitting: Podiatry

## 2014-08-22 VITALS — BP 100/53 | HR 79 | Resp 18

## 2014-08-22 DIAGNOSIS — M79676 Pain in unspecified toe(s): Secondary | ICD-10-CM

## 2014-08-22 DIAGNOSIS — E1140 Type 2 diabetes mellitus with diabetic neuropathy, unspecified: Secondary | ICD-10-CM | POA: Diagnosis not present

## 2014-08-22 DIAGNOSIS — E1149 Type 2 diabetes mellitus with other diabetic neurological complication: Secondary | ICD-10-CM

## 2014-08-22 DIAGNOSIS — Q828 Other specified congenital malformations of skin: Secondary | ICD-10-CM

## 2014-08-22 DIAGNOSIS — B351 Tinea unguium: Secondary | ICD-10-CM

## 2014-08-22 NOTE — Progress Notes (Signed)
Patient ID: Daniel [name], male   DOB: 1934/01/30, 81 y.o.   MRN: [id number]  Subjective: Daniel [name]  returns the office today for painful, elongated, thickened toenails which he cannot trim himself. Denies any redness or drainage around the nails. Also reports calluses to his feet. Denies any acute changes since last appointment and no new complaints today. Denies any systemic complaints such as fevers, chills, nausea, vomiting.   Objective: AAO 3, NAD DP/PT pulses palpable, CRT less than 3 seconds Protective sensation decreased with Simms Weinstein monofilament, Achilles tendon reflex intact.  Nails hypertrophic, dystrophic, elongated, brittle, discolored 10. There is tenderness overlying the nails 1-5 bilaterally. There is no surrounding erythema or drainage along the nail sites. Hyperkerotic lesions bilateral sub-hallux. Upon debridement of the lesions, no underlying ulceration, drainage, or other signs of infection.  No open lesions or other pre-ulcerative lesions are identified. No other areas of tenderness bilateral lower extremities. No overlying edema, erythema, increased warmth. No pain with calf compression, swelling, warmth, erythema.  Assessment: Patient presents with symptomatic onychomycosis; hyperkerotic lesions   Plan: -Treatment options including alternatives, risks, complications were discussed -Nails sharply debrided 10 without complication/bleeding. -Hyperkerotic lesions sharply debrided x 2 without complications/bleeding.  -Discussed daily foot inspection. If there are any changes, to call the office immediately.  -Follow-up in 3 months or sooner if any problems are to arise. In the meantime, encouraged to call the office with any questions, concerns, changes symptoms.

## 2014-08-23 DIAGNOSIS — Z8669 Personal history of other diseases of the nervous system and sense organs: Secondary | ICD-10-CM | POA: Insufficient documentation

## 2014-08-23 DIAGNOSIS — Z9889 Other specified postprocedural states: Secondary | ICD-10-CM

## 2014-08-23 DIAGNOSIS — H47299 Other optic atrophy, unspecified eye: Secondary | ICD-10-CM | POA: Insufficient documentation

## 2014-09-17 ENCOUNTER — Other Ambulatory Visit: Payer: Self-pay

## 2014-11-22 ENCOUNTER — Telehealth: Payer: Self-pay | Admitting: Cardiovascular Disease

## 2014-11-22 NOTE — Telephone Encounter (Signed)
Received records from [facility] for appointment on 01/18/15 with Dr Sallyanne Kuster.  Records given to [facility] (medical records) for Dr Croitoru's schedule on 01/18/15. lp

## 2014-12-03 ENCOUNTER — Ambulatory Visit (INDEPENDENT_AMBULATORY_CARE_PROVIDER_SITE_OTHER): Payer: Medicare Other | Admitting: Podiatry

## 2014-12-03 ENCOUNTER — Encounter: Payer: Self-pay | Admitting: Podiatry

## 2014-12-03 DIAGNOSIS — M79676 Pain in unspecified toe(s): Secondary | ICD-10-CM | POA: Diagnosis not present

## 2014-12-03 DIAGNOSIS — B351 Tinea unguium: Secondary | ICD-10-CM

## 2014-12-03 DIAGNOSIS — Q828 Other specified congenital malformations of skin: Secondary | ICD-10-CM

## 2014-12-03 DIAGNOSIS — E1140 Type 2 diabetes mellitus with diabetic neuropathy, unspecified: Secondary | ICD-10-CM | POA: Diagnosis not present

## 2014-12-03 DIAGNOSIS — E1149 Type 2 diabetes mellitus with other diabetic neurological complication: Secondary | ICD-10-CM

## 2014-12-03 NOTE — Progress Notes (Signed)
Patient ID: THA JOSHLIN, male   DOB: 09-22-1933, 81 y.o.   MRN: [id number]  Subjective: Mr. Marland  returns the office today for painful, elongated, thickened toenails which he cannot trim himself. Denies any redness or drainage around the nails. Also reports calluses to his feet but denies any redness or drainage from the areas. Denies any acute changes since last appointment and no new complaints today. Denies any systemic complaints such as fevers, chills, nausea, vomiting.   Objective: AAO 3, NAD DP/PT pulses palpable, CRT less than 3 seconds Protective sensation decreased with Simms Weinstein monofilament Nails hypertrophic, dystrophic, elongated, brittle, discolored 10. There is tenderness overlying the nails 1-5 bilaterally. There is no surrounding erythema or drainage along the nail sites. Hyperkerotic lesions bilateral sub-hallux. Upon debridement of the lesions, no underlying ulceration, drainage, or other signs of infection.  No open lesions or other pre-ulcerative lesions are identified. No other areas of tenderness bilateral lower extremities. No overlying edema, erythema, increased warmth. No pain with calf compression, swelling, warmth, erythema.  Assessment: Patient presents with symptomatic onychomycosis; hyperkerotic lesions   Plan: -Treatment options including alternatives, risks, complications were discussed -Nails sharply debrided 10 without complication/bleeding. -Hyperkerotic lesions sharply debrided x 2 without complications/bleeding.  -Discussed daily foot inspection. If there are any changes, to call the office immediately.  -Follow-up in 3 months or sooner if any problems are to arise. In the meantime, encouraged to call the office with any questions, concerns, changes symptoms.  Celesta Gentile, DPM

## 2015-01-18 ENCOUNTER — Ambulatory Visit: Payer: Self-pay | Admitting: Cardiovascular Disease

## 2015-01-28 ENCOUNTER — Encounter: Payer: Self-pay | Admitting: Cardiovascular Disease

## 2015-01-28 ENCOUNTER — Ambulatory Visit (INDEPENDENT_AMBULATORY_CARE_PROVIDER_SITE_OTHER): Payer: Medicare Other | Admitting: Cardiovascular Disease

## 2015-01-28 VITALS — BP 92/50 | HR 76 | Ht 68.0 in | Wt 170.0 lb

## 2015-01-28 DIAGNOSIS — I2510 Atherosclerotic heart disease of native coronary artery without angina pectoris: Secondary | ICD-10-CM

## 2015-01-28 DIAGNOSIS — I5022 Chronic systolic (congestive) heart failure: Secondary | ICD-10-CM

## 2015-01-28 DIAGNOSIS — R0989 Other specified symptoms and signs involving the circulatory and respiratory systems: Secondary | ICD-10-CM | POA: Diagnosis not present

## 2015-01-28 DIAGNOSIS — I429 Cardiomyopathy, unspecified: Secondary | ICD-10-CM | POA: Diagnosis not present

## 2015-01-28 NOTE — Progress Notes (Signed)
Patient ID: Daniel [name], male   DOB: Jun 05, 1933, 81 y.o.   MRN: [id number]     Cardiology Office Note   Date:  01/28/2015   ID:  Daniel [name], DOB September 07, 1933, MRN [id number]  PCP:  Tommy Medal, MD  Cardiologist:   Sanda Klein, MD   Chief Complaint  Patient presents with  . NEW PATIENT  . Coronary Artery Disease  . Congestive Heart Failure  . Dizziness      History of Present Illness: Daniel [name] is a 81 y.o. male who presents for  Reestablishment of cardiology follow-up. He was a patient of Dr. Chase Picket , but has not seen cardiology in at least the last 5 years. His old records are not available for review at this time , they are in storage.   after Dr. Minna Antis moved to [facility],  He is now seeing Dr. Jani Gravel as his primary care provider. It appears that after reviewing his records note was made of previous severely depressed left ventricular systolic function and also of a new left carotid bruit. He was subsequently referred for follow-up.   Mr. Daniel [name]'s cardiac problems date back to 2004 when he was seriously ill. As far as I can tell he never had angina pectoris, but was hospitalized at [facility] in [facility] for 11 days in 2004 with what sounds like acute myocardial infarction and cardiogenic shock. At that time , he received 2 Cypher stents reportedly to the LAD artery and RCA. The details are not available. Left ventricular ejection fraction was quoted as being 20-30%.  He has not required subsequent cardiac catheterization and has not been hospitalized for cardiac illness. In 2015 he was in the hospital for a noncardiac problem and an echocardiogram was performed, but was uninterpretable due to poor endocardial border definition. A contrast study was recommended, but not performed at that time.   Functional status is difficult to ascertain but he does have exertional dyspnea with greater than usual activity. His activity level is very  low. He has a chair lift in his home. He walks with a walker. He has severe neuropathy and has also had problems with retinopathy and retinal detachment. He spends most of his day in a chair. He sleeps on 2 pillows habitually, for years. He does have a cough when he lies in bed at night, but this is described as productive of thick purulent sputum and may be a consequence of postnasal drip. He does not have frank orthopnea or PND.  He denies palpitations or syncope and has never had symptoms suggest TIA or stroke. He has not had intermittent claudication. He has never smoked. He denies lower extremity edema. While he has had some slowly healing sores on his feet, these sound more like calluses due to overlapping toes rather than the consequences of poor vascular supply. His blood pressure is "always low".   He used to have severe problems with alcoholism but has been completely abstinent since January 2015. His records describe previous episodes of delirium tremens and alcoholic withdrawal seizures in the past (for example before his planned eye surgery in 2011). He has had brachial therapy for prostate cancer    Past Medical History  Diagnosis Date  . Cancer [facility])     prostate  . Alcohol abuse   . Hypertension   . Hyperlipidemia   . Complication of anesthesia     Hx of seizure when put to sleep for surgery on 08/2009  . Coronary artery  disease   . Peripheral neuropathy ([place])   . CHF (congestive heart failure) ([place])   . H/O hiatal hernia   . Seizures ([place])   . Constipation   . Urinary incontinence   . Cardiomyopathy ([place])   . Diabetes mellitus without complication [facility])     Past Surgical History  Procedure Laterality Date  . Radioactive seed implant      prostate  . Coronary stent placement      2004  . Prostate surgery  2008    SEED IMPLANTS  . Cataract extraction, bilateral    . Tonsillectomy      1940's  . Coronary angioplasty      stents x 2     Current Outpatient  Prescriptions  Medication Sig Dispense Refill  . aspirin 81 MG tablet Take 81 mg by mouth daily.    . B Complex-C (SUPER B COMPLEX PO) Take 1 tablet by mouth daily.    . citalopram (CELEXA) 10 MG tablet Take 5 mg by mouth daily.    . finasteride (PROSCAR) 5 MG tablet     . HUMALOG KWIKPEN 100 UNIT/ML KiwkPen Inject 30 Units into the skin at bedtime.   1  . hydrocortisone 2.5 % cream   0  . Insulin Glargine 300 UNIT/ML SOPN Inject 60 Units into the skin.    Marland Kitchen LEVEMIR FLEXTOUCH 100 UNIT/ML SOPN Inject 48 Units into the skin daily.     Marland Kitchen losartan (COZAAR) 50 MG tablet   0  . Multiple Vitamin (MULTIVITAMIN) tablet Take 1 tablet by mouth daily.    . potassium chloride (K-DUR) 10 MEQ tablet Take 10 mEq by mouth daily.    . rosuvastatin (CRESTOR) 5 MG tablet Take 5 mg by mouth daily.    Marland Kitchen trimethoprim (TRIMPEX) 100 MG tablet     . vitamin C (ASCORBIC ACID) 500 MG tablet Take 500 mg by mouth daily.     No current facility-administered medications for this visit.    Allergies:   Review of patient's allergies indicates no known allergies.    Social History:  The patient  reports that he has never smoked. He has never used smokeless tobacco. He reports that he drinks alcohol. He reports that he does not use illicit drugs.   Family History:  The patient's family history includes Diabetes in his brother and mother; Heart disease in his brother and father.    ROS:  Please see the history of present illness.    Otherwise, review of systems positive for none.   All other systems are reviewed and negative.    PHYSICAL EXAM: VS:  BP 92/50 mmHg  Pulse 76  Ht 5\' 8"  (1.727 m)  Wt 170 lb (77.111 kg)  BMI 25.85 kg/m2 , BMI Body mass index is 25.85 kg/(m^2).  General: Alert, oriented x3, no distress Head: no evidence of trauma, PERRL, EOMI, no exophtalmos or lid lag, no myxedema, no xanthelasma; normal ears, nose and oropharynx Neck: normal jugular venous pulsations and no hepatojugular reflux;  brisk carotid pulses without delay and distinct left carotid bruits Chest: clear to auscultation, no signs of consolidation by percussion or palpation, normal fremitus, symmetrical and full respiratory excursions Cardiovascular: normal position and quality of the apical impulse, regular rhythm, normal first and second heart sounds, no murmurs, rubs or gallops Abdomen: no tenderness or distention, no masses by palpation, no abnormal pulsatility or arterial bruits, normal bowel sounds, no hepatosplenomegaly Extremities: no clubbing, cyanosis or edema; 2+ radial, ulnar and brachial pulses  bilaterally; 2+ right femoral, posterior tibial and dorsalis pedis pulses; 2+ left femoral, posterior tibial and dorsalis pedis pulses; no subclavian or femoral bruits Neurological: grossly nonfocal Psych: euthymic mood, full affect   EKG:  EKG is ordered today. The ekg ordered today demonstrates  Sinus rhythm, anterior fascicular block, QRS 108 ms, QTC 454 ms   Recent Labs:  hemoglobin 11.3 , normal transaminases, BUN 37, creatinine 1.46 , potassium 5.5 , hemoglobin A1c 7.3%   Lipid Panel  total cholesterol 120, HDL 48, LDL 53, triglycerides 93    Wt Readings from Last 3 Encounters:  01/28/15 170 lb (77.111 kg)  08/31/13 169 lb (76.658 kg)  04/17/13 168 lb 3.4 oz (76.3 kg)      Other studies Reviewed: Additional studies/ records that were reviewed today include:   records from Dr. Jani Gravel.   ASSESSMENT AND PLAN:  1.  CAD s/p  Drug-eluting stents to LAD and right coronary arteries in 2004. Apparently he has never had angina pectoris despite significant CAD. Currently asymptomatic but with a very poor overall functional status. The focus is on risk factor management. His lipid profile is in optimal range. His hemoglobin A1c is slightly above target and recent changes or been recommended to his insulin prescription.  Need more details on his CAD history, try to get old records  2.  Chronic systolic  heart failure , functional status difficult to assess. Has not had recent evaluation of left ventricular ejection fraction (echo from 2015 was not readable). Will order another echocardiogram with microbubble contrast. He is on an angiotensin receptor blocker. I don't think he would tolerate beta blockers due to his low blood pressure.  3.  Diabetes mellitus , insulin requiring with multiple end organ complications (neuropathy, retinopathy, probable nephropathy ).  Borderline glycemic control, target A1c less than 7%  4. Hyperlipidemia on statin plus fenofibrate plus Zetia. We might be able to discontinue his  Fenofibrate , would like a little more data on previous values for his triglycerides.  5. Left carotid bruit with established vascular disease. Get duplex ultrasonography study. Unlikely we will recommend revascularization procedures in the absence of any neurological events/symptoms.  6. Anemia , normocytic, normochromic of uncertain etiology  7.  Previous history of alcohol abuse/chronic alcoholism currently abstinent  8. Chronic kidney disease, probably stage 3    Current medicines are reviewed at length with the patient today.  The patient does not have concerns regarding medicines.  The following changes have been made:  no change  Labs/ tests ordered today include:   Orders Placed This Encounter  Procedures  . ECHOCARDIOGRAM COMPLETE        - carotid duplex ultrasound   Patient Instructions  Your physician has requested that you have an echocardiogram. Echocardiography is a painless test that uses sound waves to create images of your heart. It provides your doctor with information about the size and shape of your heart and how well your heart's chambers and valves are working. This procedure takes approximately one hour. There are no restrictions for this procedure.  Your physician has requested that you have a carotid duplex. This test is an ultrasound of the carotid  arteries in your neck. It looks at blood flow through these arteries that supply the brain with blood. Allow one hour for this exam. There are no restrictions or special instructions.  Dr. Sallyanne Kuster recommends that you schedule a follow-up appointment in: FOLLOW UP AFTER TESTING        Signed,  Sanda Klein, MD  01/28/2015 1:29 PM    Sanda Klein, MD, [facility] [phone] office [phone] pager

## 2015-01-28 NOTE — Patient Instructions (Signed)
Your physician has requested that you have an echocardiogram. Echocardiography is a painless test that uses sound waves to create images of your heart. It provides your doctor with information about the size and shape of your heart and how well your heart's chambers and valves are working. This procedure takes approximately one hour. There are no restrictions for this procedure.  Your physician has requested that you have a carotid duplex. This test is an ultrasound of the carotid arteries in your neck. It looks at blood flow through these arteries that supply the brain with blood. Allow one hour for this exam. There are no restrictions or special instructions.  Dr. Sallyanne Kuster recommends that you schedule a follow-up appointment in: [place]

## 2015-01-29 NOTE — Addendum Note (Signed)
Addended by: Janett Labella A on: 01/29/2015 07:40 AM   Modules accepted: Orders

## 2015-01-30 ENCOUNTER — Encounter: Payer: Self-pay | Admitting: Cardiovascular Disease

## 2015-01-31 ENCOUNTER — Encounter: Payer: Self-pay | Admitting: Cardiovascular Disease

## 2015-02-18 ENCOUNTER — Ambulatory Visit (HOSPITAL_BASED_OUTPATIENT_CLINIC_OR_DEPARTMENT_OTHER)
Admission: RE | Admit: 2015-02-18 | Discharge: 2015-02-18 | Disposition: A | Discharge status: Home / Self Care | Payer: Medicare Other | Source: Ambulatory Visit | Attending: Cardiology | Admitting: Cardiology

## 2015-02-18 ENCOUNTER — Ambulatory Visit (HOSPITAL_COMMUNITY)
Admission: RE | Admit: 2015-02-18 | Discharge: 2015-02-18 | Disposition: A | Discharge status: Home / Self Care | Payer: Medicare Other | Source: Ambulatory Visit | Attending: Cardiology | Admitting: Cardiology

## 2015-02-18 DIAGNOSIS — R0989 Other specified symptoms and signs involving the circulatory and respiratory systems: Secondary | ICD-10-CM | POA: Diagnosis not present

## 2015-02-18 DIAGNOSIS — I6523 Occlusion and stenosis of bilateral carotid arteries: Secondary | ICD-10-CM | POA: Diagnosis not present

## 2015-02-18 DIAGNOSIS — I517 Cardiomegaly: Secondary | ICD-10-CM | POA: Insufficient documentation

## 2015-02-18 DIAGNOSIS — I10 Essential (primary) hypertension: Secondary | ICD-10-CM | POA: Insufficient documentation

## 2015-02-18 DIAGNOSIS — E785 Hyperlipidemia, unspecified: Secondary | ICD-10-CM | POA: Diagnosis not present

## 2015-02-18 DIAGNOSIS — E1142 Type 2 diabetes mellitus with diabetic polyneuropathy: Secondary | ICD-10-CM | POA: Diagnosis not present

## 2015-02-18 DIAGNOSIS — I5189 Other ill-defined heart diseases: Secondary | ICD-10-CM | POA: Diagnosis not present

## 2015-02-18 DIAGNOSIS — I429 Cardiomyopathy, unspecified: Secondary | ICD-10-CM | POA: Diagnosis not present

## 2015-02-18 DIAGNOSIS — R938 Abnormal findings on diagnostic imaging of other specified body structures: Secondary | ICD-10-CM | POA: Diagnosis not present

## 2015-02-19 ENCOUNTER — Telehealth: Payer: Self-pay | Admitting: *Deleted

## 2015-02-19 NOTE — Telephone Encounter (Signed)
Carotid doppler and echo results called to patient's wife.  Voiced understanding.  Will place order for recheck in one year.  Tests released to Mychart.  States she has a difficult time getting into MyChart.  Suggested she call the number on the last page of his AVS and they will work them through the issues.  Seeing Dr. Maudie Mercury tomorrow.

## 2015-02-19 NOTE — Telephone Encounter (Signed)
-----   Message from Sanda Klein, MD sent at 02/18/2015  5:45 PM EST ----- 60-79% right carotid stenosis 40-59% left carotid stenosis Unfortunately, cannot locate any old study for comparison. Would recheck in one year to assess if there is progression. No plan for surgery/stent unless he develops neurological symptoms (stroke, TIA).

## 2015-02-25 ENCOUNTER — Ambulatory Visit: Payer: Self-pay | Admitting: Cardiovascular Disease

## 2015-03-04 ENCOUNTER — Ambulatory Visit (INDEPENDENT_AMBULATORY_CARE_PROVIDER_SITE_OTHER): Payer: Medicare Other | Admitting: Podiatry

## 2015-03-04 DIAGNOSIS — E1149 Type 2 diabetes mellitus with other diabetic neurological complication: Secondary | ICD-10-CM | POA: Diagnosis not present

## 2015-03-04 DIAGNOSIS — Q828 Other specified congenital malformations of skin: Secondary | ICD-10-CM

## 2015-03-04 DIAGNOSIS — M79676 Pain in unspecified toe(s): Secondary | ICD-10-CM | POA: Diagnosis not present

## 2015-03-04 DIAGNOSIS — B351 Tinea unguium: Secondary | ICD-10-CM

## 2015-03-04 NOTE — Progress Notes (Signed)
Patient ID: Daniel [name], male   DOB: 1934/03/07, 81 y.o.   MRN: [id number]  Subjective: Daniel [name]  returns the office today for painful, elongated, thickened toenails which he cannot trim himself. Denies any redness or drainage around the nails. Also reports calluses to his feet but denies any redness or drainage from the areas. Denies any acute changes since last appointment and no new complaints today. Denies any systemic complaints such as fevers, chills, nausea, vomiting.   Objective: AAO 3, NAD DP/PT pulses palpable, CRT less than 3 seconds Protective sensation decreased with Simms Weinstein monofilament Nails hypertrophic, dystrophic, elongated, brittle, discolored 10. There is tenderness overlying the nails 1-5 bilaterally. There is no surrounding erythema or drainage along the nail sites. Hyperkerotic lesions bilateral sub-hallux. Upon debridement of the lesions, no underlying ulceration, drainage, or other signs of infection.  No open lesions or other pre-ulcerative lesions are identified. No other areas of tenderness bilateral lower extremities. No overlying edema, erythema, increased warmth. No pain with calf compression, swelling, warmth, erythema.  Assessment: Patient presents with symptomatic onychomycosis; hyperkerotic lesions   Plan: -Treatment options including alternatives, risks, complications were discussed -Nails sharply debrided 10 without complication/bleeding. -Hyperkerotic lesions sharply debrided x 2 without complications/bleeding.  -Discussed daily foot inspection. If there are any changes, to call the office immediately.  -Follow-up in 3 months or sooner if any problems are to arise. In the meantime, encouraged to call the office with any questions, concerns, changes symptoms.  Celesta Gentile, DPM

## 2015-03-05 DIAGNOSIS — Q828 Other specified congenital malformations of skin: Secondary | ICD-10-CM | POA: Insufficient documentation

## 2015-03-05 DIAGNOSIS — E1149 Type 2 diabetes mellitus with other diabetic neurological complication: Secondary | ICD-10-CM | POA: Insufficient documentation

## 2015-06-03 ENCOUNTER — Ambulatory Visit (INDEPENDENT_AMBULATORY_CARE_PROVIDER_SITE_OTHER): Payer: Medicare Other | Admitting: Podiatry

## 2015-06-03 ENCOUNTER — Encounter: Payer: Self-pay | Admitting: Podiatry

## 2015-06-03 DIAGNOSIS — B351 Tinea unguium: Secondary | ICD-10-CM | POA: Diagnosis not present

## 2015-06-03 DIAGNOSIS — M79676 Pain in unspecified toe(s): Secondary | ICD-10-CM | POA: Diagnosis not present

## 2015-06-03 DIAGNOSIS — E1149 Type 2 diabetes mellitus with other diabetic neurological complication: Secondary | ICD-10-CM | POA: Diagnosis not present

## 2015-06-03 DIAGNOSIS — Q828 Other specified congenital malformations of skin: Secondary | ICD-10-CM

## 2015-06-03 NOTE — Progress Notes (Signed)
Patient ID: RONAV CLARY, male   DOB: 1934/03/01, 82 y.o.   MRN: [id number]  Subjective: Mr. Reitenbach  returns the office today for painful, elongated, thickened toenails which he cannot trim himself. Denies any redness or drainage around the nails. He also has calluses to his feet but denies any redness or drainage from the areas. Denies any acute changes since last appointment and no new complaints today. Denies any systemic complaints such as fevers, chills, nausea, vomiting.   Objective: AAO 3, NAD DP/PT pulses palpable, CRT less than 3 seconds Protective sensation decreased with Simms Weinstein monofilament Nails hypertrophic, dystrophic, elongated, brittle, discolored 10. There is tenderness overlying the nails 1-5 bilaterally. There is no surrounding erythema or drainage along the nail sites. Hyperkerotic lesions bilateral sub-hallux. Upon debridement of the lesions, no underlying ulceration, drainage, or other signs of infection.  No open lesions or other pre-ulcerative lesions are identified. No other areas of tenderness bilateral lower extremities. No overlying edema, erythema, increased warmth. No pain with calf compression, swelling, warmth, erythema.  Assessment: Patient presents with symptomatic onychomycosis; hyperkerotic lesions   Plan: -Treatment options including alternatives, risks, complications were discussed -Nails sharply debrided 10 without complication/bleeding. -Hyperkerotic lesions sharply debrided x 2 without complications/bleeding.  -Discussed daily foot inspection. If there are any changes, to call the office immediately.  -Follow-up in 3 months or sooner if any problems are to arise. In the meantime, encouraged to call the office with any questions, concerns, changes symptoms.  Celesta Gentile, DPM

## 2015-09-09 ENCOUNTER — Encounter: Payer: Self-pay | Admitting: Podiatry

## 2015-09-09 ENCOUNTER — Ambulatory Visit (INDEPENDENT_AMBULATORY_CARE_PROVIDER_SITE_OTHER): Payer: Medicare Other | Admitting: Podiatry

## 2015-09-09 DIAGNOSIS — E1149 Type 2 diabetes mellitus with other diabetic neurological complication: Secondary | ICD-10-CM

## 2015-09-09 DIAGNOSIS — Q828 Other specified congenital malformations of skin: Secondary | ICD-10-CM | POA: Diagnosis not present

## 2015-09-09 DIAGNOSIS — M79676 Pain in unspecified toe(s): Secondary | ICD-10-CM

## 2015-09-09 DIAGNOSIS — B351 Tinea unguium: Secondary | ICD-10-CM | POA: Diagnosis not present

## 2015-09-09 NOTE — Progress Notes (Signed)
Patient ID: SUVAN EISENBERGER, male   DOB: [birth year], 82 y.o.   MRN: [id number]  Subjective: Mr. Delisi  returns the office today for painful, elongated, thickened toenails which he cannot trim himself. Denies any redness or drainage around the nails. He also has calluses to his feet. Denies any acute changes since last appointment and no new complaints today. Denies any systemic complaints such as fevers, chills, nausea, vomiting.   Objective: AAO 3, NAD DP/PT pulses palpable, CRT less than 3 seconds Protective sensation decreased with Simms Weinstein monofilament Nails hypertrophic, dystrophic, elongated, brittle, discolored 10. There is tenderness overlying the nails 1-5 bilaterally. There is no surrounding erythema or drainage along the nail sites. Hyperkerotic lesions bilateral sub-hallux. Upon debridement of the lesions, no underlying ulceration, drainage, or other signs of infection.  No open lesions or other pre-ulcerative lesions are identified. No other areas of tenderness bilateral lower extremities. No overlying edema, erythema, increased warmth. No pain with calf compression, swelling, warmth, erythema.  Assessment: Patient presents with symptomatic onychomycosis; hyperkerotic lesions   Plan: -Treatment options including alternatives, risks, complications were discussed -Nails sharply debrided 10 without complication/bleeding. -Hyperkerotic lesions sharply debrided x 2 without complications/bleeding.  -Discussed daily foot inspection. If there are any changes, to call the office immediately.  -Follow-up in 3 months or sooner if any problems are to arise. In the meantime, encouraged to call the office with any questions, concerns, changes symptoms.  Celesta Gentile, DPM

## 2015-12-09 ENCOUNTER — Ambulatory Visit (INDEPENDENT_AMBULATORY_CARE_PROVIDER_SITE_OTHER): Payer: Medicare Other | Admitting: Podiatry

## 2015-12-09 DIAGNOSIS — E1149 Type 2 diabetes mellitus with other diabetic neurological complication: Secondary | ICD-10-CM | POA: Diagnosis not present

## 2015-12-09 DIAGNOSIS — M79676 Pain in unspecified toe(s): Secondary | ICD-10-CM

## 2015-12-09 DIAGNOSIS — Q828 Other specified congenital malformations of skin: Secondary | ICD-10-CM

## 2015-12-09 DIAGNOSIS — B351 Tinea unguium: Secondary | ICD-10-CM | POA: Diagnosis not present

## 2015-12-16 NOTE — Progress Notes (Signed)
Patient ID: DOSSIE SWOR, male   DOB: April 19, 1933, 82 y.o.   MRN: [id number]  Subjective: Mr. Blancett  returns the office today for painful, elongated, thickened toenails which he cannot trim himself. Denies any redness or drainage around the nails. He also has calluses to his feet which come back but denies any redness or drainage. Denies any acute changes since last appointment and no new complaints today. Denies any systemic complaints such as fevers, chills, nausea, vomiting.   Objective: AAO 3, NAD DP/PT pulses palpable, CRT less than 3 seconds Protective sensation decreased with Simms Weinstein monofilament Nails hypertrophic, dystrophic, elongated, brittle, discolored 10. There is tenderness overlying the nails 1-5 bilaterally. There is no surrounding erythema or drainage along the nail sites. Hyperkerotic lesions bilateral sub-hallux. Upon debridement of the lesions, no underlying ulceration, drainage, or other signs of infection.  No open lesions or other pre-ulcerative lesions are identified. No other areas of tenderness bilateral lower extremities. No overlying edema, erythema, increased warmth. No pain with calf compression, swelling, warmth, erythema.  Assessment: Patient presents with symptomatic onychomycosis; hyperkerotic lesions   Plan: -Treatment options including alternatives, risks, complications were discussed -Nails sharply debrided 10 without complication/bleeding. -Hyperkerotic lesions sharply debrided x 2 without complications/bleeding.  -Discussed daily foot inspection. If there are any changes, to call the office immediately.  -Follow-up in 3 months or sooner if any problems are to arise. In the meantime, encouraged to call the office with any questions, concerns, changes symptoms.  Celesta Gentile, DPM

## 2016-01-01 ENCOUNTER — Institutional Professional Consult (permissible substitution): Payer: Medicare Other | Admitting: Pulmonary Disease

## 2016-03-09 ENCOUNTER — Ambulatory Visit (INDEPENDENT_AMBULATORY_CARE_PROVIDER_SITE_OTHER): Payer: Medicare Other | Admitting: Podiatry

## 2016-03-09 ENCOUNTER — Encounter: Payer: Self-pay | Admitting: Podiatry

## 2016-03-09 DIAGNOSIS — M79676 Pain in unspecified toe(s): Secondary | ICD-10-CM

## 2016-03-09 DIAGNOSIS — B351 Tinea unguium: Secondary | ICD-10-CM | POA: Diagnosis not present

## 2016-03-09 DIAGNOSIS — Q828 Other specified congenital malformations of skin: Secondary | ICD-10-CM

## 2016-03-09 DIAGNOSIS — E1149 Type 2 diabetes mellitus with other diabetic neurological complication: Secondary | ICD-10-CM

## 2016-03-09 NOTE — Progress Notes (Signed)
Patient ID: Daniel [name], male   DOB: Nov 20, 1933, 82 y.o.   MRN: [id number]  Subjective: Daniel [name] is a 82 year old male who presents to the office today for painful, elongated, thickened toenails which he cannot trim himself. Denies any redness or drainage around the nails. He also has calluses to his feet and  denies any redness or drainage around the areas. Denies any acute changes since last appointment and no new complaints today. Denies any systemic complaints such as fevers, chills, nausea, vomiting.   Objective: AAO 3, NAD DP/PT pulses palpable, CRT less than 3 seconds Protective sensation decreased with Simms Weinstein monofilament Nails hypertrophic, dystrophic, elongated, brittle, discolored 10. There is tenderness overlying the nails 1-5 bilaterally. There is no surrounding erythema or drainage along the nail sites. Hyperkerotic lesions bilateral sub-hallux. Upon debridement of the lesions, no underlying ulceration, drainage, or other signs of infection.  No open lesions or other pre-ulcerative lesions are identified. No other areas of tenderness bilateral lower extremities. No overlying edema, erythema, increased warmth. No pain with calf compression, swelling, warmth, erythema.  Assessment: Patient presents with symptomatic onychomycosis; hyperkerotic lesions   Plan: -Treatment options including alternatives, risks, complications were discussed -Nails sharply debrided 10 without complication/bleeding. -Hyperkerotic lesions sharply debrided x 2 without complications/bleeding.  -Discussed daily foot inspection. If there are any changes, to call the office immediately.  -Follow-up in 3 months or sooner if any problems are to arise. In the meantime, encouraged to call the office with any questions, concerns, changes symptoms.  Celesta Gentile, DPM

## 2016-04-07 DIAGNOSIS — H401233 Low-tension glaucoma, bilateral, severe stage: Secondary | ICD-10-CM | POA: Diagnosis not present

## 2016-04-07 DIAGNOSIS — H04123 Dry eye syndrome of bilateral lacrimal glands: Secondary | ICD-10-CM | POA: Diagnosis not present

## 2016-04-07 DIAGNOSIS — Z961 Presence of intraocular lens: Secondary | ICD-10-CM | POA: Diagnosis not present

## 2016-06-01 ENCOUNTER — Ambulatory Visit: Payer: Medicare Other | Admitting: Podiatry

## 2016-06-04 ENCOUNTER — Ambulatory Visit (INDEPENDENT_AMBULATORY_CARE_PROVIDER_SITE_OTHER): Payer: Medicare HMO | Admitting: Podiatry

## 2016-06-04 ENCOUNTER — Encounter: Payer: Self-pay | Admitting: Podiatry

## 2016-06-04 DIAGNOSIS — M79676 Pain in unspecified toe(s): Secondary | ICD-10-CM

## 2016-06-04 DIAGNOSIS — B351 Tinea unguium: Secondary | ICD-10-CM

## 2016-06-04 DIAGNOSIS — E1149 Type 2 diabetes mellitus with other diabetic neurological complication: Secondary | ICD-10-CM

## 2016-06-04 DIAGNOSIS — Q828 Other specified congenital malformations of skin: Secondary | ICD-10-CM | POA: Diagnosis not present

## 2016-06-04 NOTE — Progress Notes (Signed)
Patient ID: Daniel [name], male   DOB: [birth year], 83 y.o.   MRN: [id number]  Subjective: Daniel [name] is a 83 year old male who presents to the office today for painful, elongated, thickened toenails which he cannot trim himself. Denies any redness or drainage around the nails. He also has calluses to his feet and  denies any redness or drainage around the areas. Denies any acute changes since last appointment and no new complaints today. Denies any systemic complaints such as fevers, chills, nausea, vomiting.   Objective: AAO 3, NAD DP/PT pulses palpable, CRT less than 3 seconds Protective sensation decreased with Simms Weinstein monofilament Nails hypertrophic, dystrophic, elongated, brittle, discolored 10. There is tenderness overlying the nails 1-5 bilaterally. There is no surrounding erythema or drainage along the nail sites. Hyperkerotic lesions bilateral sub-hallux. Upon debridement of the lesions, no underlying ulceration, drainage, or other signs of infection.  No open lesions or other pre-ulcerative lesions are identified. No other areas of tenderness bilateral lower extremities. No overlying edema, erythema, increased warmth. No pain with calf compression, swelling, warmth, erythema.  Assessment: Patient presents with symptomatic onychomycosis; hyperkerotic lesions   Plan: -Treatment options including alternatives, risks, complications were discussed -Nails sharply debrided 10 without complication/bleeding. -Hyperkerotic lesions sharply debrided x 2 without complications/bleeding.  -Discussed daily foot inspection. If there are any changes, to call the office immediately.  -Follow-up in 3 months or sooner if any problems are to arise. In the meantime, encouraged to call the office with any questions, concerns, changes symptoms.  Celesta Gentile, DPM

## 2016-07-14 DIAGNOSIS — R209 Unspecified disturbances of skin sensation: Secondary | ICD-10-CM | POA: Diagnosis not present

## 2016-07-14 DIAGNOSIS — R69 Illness, unspecified: Secondary | ICD-10-CM | POA: Diagnosis not present

## 2016-07-14 DIAGNOSIS — E7800 Pure hypercholesterolemia, unspecified: Secondary | ICD-10-CM | POA: Diagnosis not present

## 2016-07-14 DIAGNOSIS — N3943 Post-void dribbling: Secondary | ICD-10-CM | POA: Diagnosis not present

## 2016-07-14 DIAGNOSIS — E119 Type 2 diabetes mellitus without complications: Secondary | ICD-10-CM | POA: Diagnosis not present

## 2016-07-14 DIAGNOSIS — N403 Nodular prostate with lower urinary tract symptoms: Secondary | ICD-10-CM | POA: Diagnosis not present

## 2016-07-14 DIAGNOSIS — Z0000 Encounter for general adult medical examination without abnormal findings: Secondary | ICD-10-CM | POA: Diagnosis not present

## 2016-07-14 DIAGNOSIS — Z87891 Personal history of nicotine dependence: Secondary | ICD-10-CM | POA: Diagnosis not present

## 2016-07-14 DIAGNOSIS — Z87440 Personal history of urinary (tract) infections: Secondary | ICD-10-CM | POA: Diagnosis not present

## 2016-07-14 DIAGNOSIS — I10 Essential (primary) hypertension: Secondary | ICD-10-CM | POA: Diagnosis not present

## 2016-07-14 DIAGNOSIS — M130 Polyarthritis, unspecified: Secondary | ICD-10-CM | POA: Diagnosis not present

## 2016-07-14 DIAGNOSIS — Z6828 Body mass index (BMI) 28.0-28.9, adult: Secondary | ICD-10-CM | POA: Diagnosis not present

## 2016-07-14 DIAGNOSIS — Z79899 Other long term (current) drug therapy: Secondary | ICD-10-CM | POA: Diagnosis not present

## 2016-07-20 DIAGNOSIS — R69 Illness, unspecified: Secondary | ICD-10-CM | POA: Diagnosis not present

## 2016-07-28 DIAGNOSIS — L821 Other seborrheic keratosis: Secondary | ICD-10-CM | POA: Diagnosis not present

## 2016-07-28 DIAGNOSIS — L0889 Other specified local infections of the skin and subcutaneous tissue: Secondary | ICD-10-CM | POA: Diagnosis not present

## 2016-07-28 DIAGNOSIS — D1801 Hemangioma of skin and subcutaneous tissue: Secondary | ICD-10-CM | POA: Diagnosis not present

## 2016-07-28 DIAGNOSIS — L814 Other melanin hyperpigmentation: Secondary | ICD-10-CM | POA: Diagnosis not present

## 2016-07-28 DIAGNOSIS — Z85828 Personal history of other malignant neoplasm of skin: Secondary | ICD-10-CM | POA: Diagnosis not present

## 2016-07-28 DIAGNOSIS — L570 Actinic keratosis: Secondary | ICD-10-CM | POA: Diagnosis not present

## 2016-08-24 DIAGNOSIS — R809 Proteinuria, unspecified: Secondary | ICD-10-CM | POA: Diagnosis not present

## 2016-08-24 DIAGNOSIS — D649 Anemia, unspecified: Secondary | ICD-10-CM | POA: Diagnosis not present

## 2016-08-24 DIAGNOSIS — N183 Chronic kidney disease, stage 3 (moderate): Secondary | ICD-10-CM | POA: Diagnosis not present

## 2016-08-24 DIAGNOSIS — R69 Illness, unspecified: Secondary | ICD-10-CM | POA: Diagnosis not present

## 2016-08-24 DIAGNOSIS — I10 Essential (primary) hypertension: Secondary | ICD-10-CM | POA: Diagnosis not present

## 2016-08-24 DIAGNOSIS — E1165 Type 2 diabetes mellitus with hyperglycemia: Secondary | ICD-10-CM | POA: Diagnosis not present

## 2016-08-31 ENCOUNTER — Encounter: Payer: Self-pay | Admitting: Podiatry

## 2016-08-31 ENCOUNTER — Ambulatory Visit (INDEPENDENT_AMBULATORY_CARE_PROVIDER_SITE_OTHER): Payer: Medicare HMO | Admitting: Podiatry

## 2016-08-31 DIAGNOSIS — M79675 Pain in left toe(s): Secondary | ICD-10-CM

## 2016-08-31 DIAGNOSIS — M79674 Pain in right toe(s): Secondary | ICD-10-CM | POA: Diagnosis not present

## 2016-08-31 DIAGNOSIS — E1149 Type 2 diabetes mellitus with other diabetic neurological complication: Secondary | ICD-10-CM

## 2016-08-31 DIAGNOSIS — B351 Tinea unguium: Secondary | ICD-10-CM | POA: Diagnosis not present

## 2016-08-31 DIAGNOSIS — Q828 Other specified congenital malformations of skin: Secondary | ICD-10-CM | POA: Diagnosis not present

## 2016-09-01 NOTE — Progress Notes (Signed)
Patient ID: HOOVER GREWE, male   DOB: January 13, 1934, 83 y.o.   MRN: [id number]  Subjective: Mr. Richeson is a 83 year old male who presents to the office today for painful, elongated, thickened toenails which he cannot trim himself. Denies any redness or drainage around the nails. He also has calluses to his feet and  denies any redness or drainage around the areas. He also states that he stubbed his left second toe he saw Dr. Ronnald Ramp is dermatologist and he was given Keflex as well as mupirocin ointment. He finish the course of antibiotics as well as wife has continue with antibiotic ointment on the area. Denies any drainage or pus coming from the area. Denies any acute changes since last appointment and no new complaints today. Denies any systemic complaints such as fevers, chills, nausea, vomiting.   Objective: AAO 3, NAD DP/PT pulses palpable, CRT less than 3 seconds Protective sensation decreased with Simms Weinstein monofilament Nails hypertrophic, dystrophic, elongated, brittle, discolored 10. There is tenderness overlying the nails 1-5 bilaterally. There is no surrounding erythema or drainage along the nail sites. Hyperkerotic lesions bilateral sub-hallux. Upon debridement of the lesions, no underlying ulceration, drainage, or other signs of infection.  On the medial aspect the left second toe is a hyperkeratotic lesion with what appears to be old dried blood underneath the area. Upon debridement there is a very small superficial skin abrasion type lesion. There is no ascending erythema, ascending cellulitis. There is no drainage or pus expressed. No clinical signs of infection. No open lesions or other pre-ulcerative lesions are identified. No other areas of tenderness bilateral lower extremities. No overlying edema, erythema, increased warmth. No pain with calf compression, swelling, warmth, erythema.  Assessment: Patient presents with symptomatic onychomycosis; hyperkerotic lesions;  superficial wound left second toe   Plan: -Treatment options including alternatives, risks, complications were discussed -Nails sharply debrided 10 without complication/bleeding. -Hyperkerotic lesions sharply debrided x 2 without complications/bleeding.  -I shop and debrided the hyperkeratotic tissue the second toe on the wound. Underlying areas on this completely healed at this time. Recommend continued mupirocin ointment dressing changes during the day but currently the area uncovered at night to help dry. If not healed in 2 weeks recommended follow-up with either myself or Dr. Ronnald Ramp. Monitor for any signs or symptoms of infection. -Discussed daily foot inspection. If there are any changes, to call the office immediately.  -Follow-up in 3 months for routine care or sooner if any problems are to arise. In the meantime, encouraged to call the office with any questions, concerns, changes symptoms.  Celesta Gentile, DPM

## 2016-09-08 DIAGNOSIS — E1165 Type 2 diabetes mellitus with hyperglycemia: Secondary | ICD-10-CM | POA: Diagnosis not present

## 2016-09-08 DIAGNOSIS — Z125 Encounter for screening for malignant neoplasm of prostate: Secondary | ICD-10-CM | POA: Diagnosis not present

## 2016-09-08 DIAGNOSIS — E785 Hyperlipidemia, unspecified: Secondary | ICD-10-CM | POA: Diagnosis not present

## 2016-09-08 DIAGNOSIS — I10 Essential (primary) hypertension: Secondary | ICD-10-CM | POA: Diagnosis not present

## 2016-09-08 DIAGNOSIS — C61 Malignant neoplasm of prostate: Secondary | ICD-10-CM | POA: Diagnosis not present

## 2016-09-15 DIAGNOSIS — Z8546 Personal history of malignant neoplasm of prostate: Secondary | ICD-10-CM | POA: Diagnosis not present

## 2016-09-15 DIAGNOSIS — R32 Unspecified urinary incontinence: Secondary | ICD-10-CM | POA: Diagnosis not present

## 2016-10-06 DIAGNOSIS — H04123 Dry eye syndrome of bilateral lacrimal glands: Secondary | ICD-10-CM | POA: Diagnosis not present

## 2016-10-06 DIAGNOSIS — H401233 Low-tension glaucoma, bilateral, severe stage: Secondary | ICD-10-CM | POA: Diagnosis not present

## 2016-10-06 DIAGNOSIS — Z961 Presence of intraocular lens: Secondary | ICD-10-CM | POA: Diagnosis not present

## 2016-10-18 DIAGNOSIS — R69 Illness, unspecified: Secondary | ICD-10-CM | POA: Diagnosis not present

## 2016-11-05 ENCOUNTER — Encounter (INDEPENDENT_AMBULATORY_CARE_PROVIDER_SITE_OTHER): Payer: Medicare HMO | Admitting: Ophthalmology

## 2016-12-07 ENCOUNTER — Ambulatory Visit (INDEPENDENT_AMBULATORY_CARE_PROVIDER_SITE_OTHER): Payer: Medicare HMO | Admitting: Podiatry

## 2016-12-07 ENCOUNTER — Encounter: Payer: Self-pay | Admitting: Podiatry

## 2016-12-07 DIAGNOSIS — B351 Tinea unguium: Secondary | ICD-10-CM

## 2016-12-07 DIAGNOSIS — E1149 Type 2 diabetes mellitus with other diabetic neurological complication: Secondary | ICD-10-CM | POA: Diagnosis not present

## 2016-12-07 DIAGNOSIS — M79675 Pain in left toe(s): Secondary | ICD-10-CM | POA: Diagnosis not present

## 2016-12-07 DIAGNOSIS — Q828 Other specified congenital malformations of skin: Secondary | ICD-10-CM | POA: Diagnosis not present

## 2016-12-07 DIAGNOSIS — M79674 Pain in right toe(s): Secondary | ICD-10-CM

## 2016-12-07 DIAGNOSIS — R69 Illness, unspecified: Secondary | ICD-10-CM | POA: Diagnosis not present

## 2016-12-07 NOTE — Progress Notes (Signed)
Patient ID: Daniel [name], male   DOB: [birth year], 83 y.o.   MRN: [id number]  Subjective: Daniel [name] is a 83 year old male who presents to the office today for painful, elongated, thickened toenails which he cannot trim himself. Denies any redness or drainage around the nails. He also has calluses to his feet and  denies any redness or drainage around the areas. Denies any acute changes since last appointment and no new complaints today. Denies any systemic complaints such as fevers, chills, nausea, vomiting.   Objective: AAO 3, NAD DP/PT pulses palpable, CRT less than 3 seconds Protective sensation decreased with Simms Weinstein monofilament Nails hypertrophic, dystrophic, elongated, brittle, discolored 10. There is tenderness overlying the nails 1-5 bilaterally. There is no surrounding erythema or drainage along the nail sites. Hyperkerotic lesions bilateral sub-hallux. Upon debridement of the lesions, no underlying ulceration, drainage, or other signs of infection.  No other areas of tenderness bilateral lower extremities. No overlying edema, erythema, increased warmth. No pain with calf compression, swelling, warmth, erythema.  Assessment: Patient presents with symptomatic onychomycosis; hyperkerotic lesions; superficial wound left second toe   Plan: -Treatment options including alternatives, risks, complications were discussed -Nails sharply debrided 10 without complication/bleeding. -Hyperkerotic lesions sharply debrided x 2 without complications/bleeding.   -Follow-up in 3 months for routine care or sooner if any problems are to arise. In the meantime, encouraged to call the office with any questions, concerns, changes symptoms.  Celesta Gentile, DPM

## 2017-01-14 DIAGNOSIS — R69 Illness, unspecified: Secondary | ICD-10-CM | POA: Diagnosis not present

## 2017-02-01 DIAGNOSIS — L565 Disseminated superficial actinic porokeratosis (DSAP): Secondary | ICD-10-CM | POA: Diagnosis not present

## 2017-02-01 DIAGNOSIS — L570 Actinic keratosis: Secondary | ICD-10-CM | POA: Diagnosis not present

## 2017-02-01 DIAGNOSIS — Z85828 Personal history of other malignant neoplasm of skin: Secondary | ICD-10-CM | POA: Diagnosis not present

## 2017-02-01 DIAGNOSIS — L821 Other seborrheic keratosis: Secondary | ICD-10-CM | POA: Diagnosis not present

## 2017-03-08 ENCOUNTER — Ambulatory Visit: Payer: Medicare HMO | Admitting: Podiatry

## 2017-03-08 ENCOUNTER — Encounter: Payer: Self-pay | Admitting: Podiatry

## 2017-03-08 DIAGNOSIS — M79675 Pain in left toe(s): Secondary | ICD-10-CM | POA: Diagnosis not present

## 2017-03-08 DIAGNOSIS — E1149 Type 2 diabetes mellitus with other diabetic neurological complication: Secondary | ICD-10-CM

## 2017-03-08 DIAGNOSIS — Q828 Other specified congenital malformations of skin: Secondary | ICD-10-CM | POA: Diagnosis not present

## 2017-03-08 DIAGNOSIS — M79674 Pain in right toe(s): Secondary | ICD-10-CM

## 2017-03-08 DIAGNOSIS — B351 Tinea unguium: Secondary | ICD-10-CM | POA: Diagnosis not present

## 2017-03-08 NOTE — Progress Notes (Signed)
Patient ID: Daniel [name], male   DOB: February 10, 1934, 83 y.o.   MRN: [id number]  Subjective: Daniel [name] is a 83 year old male who presents to the office today for painful, elongated, thickened toenails which he cannot trim himself. Denies any redness or drainage around the nails. He also has calluses to his feet and  denies any redness or drainage around the areas. Denies any acute changes since last appointment and no new complaints today. Denies any systemic complaints such as fevers, chills, nausea, vomiting.   Objective: AAO 3, NAD DP/PT pulses palpable, CRT less than 3 seconds Protective sensation decreased with Simms Weinstein monofilament Nails hypertrophic, dystrophic, elongated, brittle, discolored 10. There is tenderness overlying the nails 1-5 bilaterally. There is no surrounding erythema or drainage along the nail sites. Hyperkerotic lesions bilateral sub-hallux. Upon debridement of the lesions, no underlying ulceration, drainage, or other signs of infection.  No other areas of tenderness bilateral lower extremities. No overlying edema, erythema, increased warmth. No pain with calf compression, swelling, warmth, erythema.  Assessment: Patient presents with symptomatic onychomycosis; hyperkerotic lesions; superficial wound left second toe   Plan: -Treatment options including alternatives, risks, complications were discussed -Nails sharply debrided 10 without complication/bleeding. -Hyperkerotic lesions sharply debrided x 2 without complications/bleeding.   -Follow-up in 3 months for routine care or sooner if any problems are to arise. In the meantime, encouraged to call the office with any questions, concerns, changes symptoms.  Celesta Gentile, DPM

## 2017-03-22 DIAGNOSIS — I10 Essential (primary) hypertension: Secondary | ICD-10-CM | POA: Diagnosis not present

## 2017-03-22 DIAGNOSIS — Z125 Encounter for screening for malignant neoplasm of prostate: Secondary | ICD-10-CM | POA: Diagnosis not present

## 2017-03-22 DIAGNOSIS — E1165 Type 2 diabetes mellitus with hyperglycemia: Secondary | ICD-10-CM | POA: Diagnosis not present

## 2017-03-29 DIAGNOSIS — Z0000 Encounter for general adult medical examination without abnormal findings: Secondary | ICD-10-CM | POA: Diagnosis not present

## 2017-03-29 DIAGNOSIS — Z794 Long term (current) use of insulin: Secondary | ICD-10-CM | POA: Diagnosis not present

## 2017-03-29 DIAGNOSIS — E118 Type 2 diabetes mellitus with unspecified complications: Secondary | ICD-10-CM | POA: Diagnosis not present

## 2017-04-13 DIAGNOSIS — Z961 Presence of intraocular lens: Secondary | ICD-10-CM | POA: Diagnosis not present

## 2017-04-13 DIAGNOSIS — H401233 Low-tension glaucoma, bilateral, severe stage: Secondary | ICD-10-CM | POA: Diagnosis not present

## 2017-04-19 DIAGNOSIS — R69 Illness, unspecified: Secondary | ICD-10-CM | POA: Diagnosis not present

## 2017-05-08 ENCOUNTER — Emergency Department (HOSPITAL_COMMUNITY): Payer: Medicare HMO

## 2017-05-08 ENCOUNTER — Emergency Department (HOSPITAL_COMMUNITY)
Admission: EM | Admit: 2017-05-08 | Discharge: 2017-05-08 | Disposition: A | Discharge status: Home / Self Care | Payer: Medicare HMO | Attending: Emergency Medicine | Admitting: Emergency Medicine

## 2017-05-08 ENCOUNTER — Encounter (HOSPITAL_COMMUNITY): Payer: Self-pay

## 2017-05-08 DIAGNOSIS — Z79899 Other long term (current) drug therapy: Secondary | ICD-10-CM | POA: Insufficient documentation

## 2017-05-08 DIAGNOSIS — R402441 Other coma, without documented Glasgow coma scale score, or with partial score reported, in the field [EMT or ambulance]: Secondary | ICD-10-CM | POA: Diagnosis not present

## 2017-05-08 DIAGNOSIS — R4182 Altered mental status, unspecified: Secondary | ICD-10-CM | POA: Diagnosis not present

## 2017-05-08 DIAGNOSIS — I110 Hypertensive heart disease with heart failure: Secondary | ICD-10-CM | POA: Insufficient documentation

## 2017-05-08 DIAGNOSIS — N390 Urinary tract infection, site not specified: Secondary | ICD-10-CM | POA: Diagnosis not present

## 2017-05-08 DIAGNOSIS — I2510 Atherosclerotic heart disease of native coronary artery without angina pectoris: Secondary | ICD-10-CM | POA: Insufficient documentation

## 2017-05-08 DIAGNOSIS — I5022 Chronic systolic (congestive) heart failure: Secondary | ICD-10-CM | POA: Diagnosis not present

## 2017-05-08 DIAGNOSIS — Z955 Presence of coronary angioplasty implant and graft: Secondary | ICD-10-CM | POA: Diagnosis not present

## 2017-05-08 DIAGNOSIS — I10 Essential (primary) hypertension: Secondary | ICD-10-CM | POA: Diagnosis not present

## 2017-05-08 DIAGNOSIS — Z7982 Long term (current) use of aspirin: Secondary | ICD-10-CM | POA: Insufficient documentation

## 2017-05-08 DIAGNOSIS — Z8546 Personal history of malignant neoplasm of prostate: Secondary | ICD-10-CM | POA: Diagnosis not present

## 2017-05-08 DIAGNOSIS — E1149 Type 2 diabetes mellitus with other diabetic neurological complication: Secondary | ICD-10-CM | POA: Diagnosis not present

## 2017-05-08 DIAGNOSIS — Z794 Long term (current) use of insulin: Secondary | ICD-10-CM | POA: Insufficient documentation

## 2017-05-08 LAB — TROPONIN I
TROPONIN I: 0.04 ng/mL — AB (ref <0.03)
TROPONIN I: 0.03 ng/mL — AB (ref <0.03)

## 2017-05-08 LAB — URINALYSIS, ROUTINE W REFLEX MICROSCOPIC
BACTERIA UA: NONE SEEN
BILIRUBIN URINE: NEGATIVE
Glucose, UA: NEGATIVE mg/dL
HGB URINE DIPSTICK: NEGATIVE
Ketones, ur: NEGATIVE mg/dL
Nitrite: NEGATIVE
PROTEIN: 100 mg/dL — AB
Specific Gravity, Urine: 1.015 (ref 1.005–1.030)
pH: 6 (ref 5.0–8.0)

## 2017-05-08 LAB — COMPREHENSIVE METABOLIC PANEL
ALK PHOS: 67 U/L (ref 38–126)
ALT: 18 U/L (ref 17–63)
ANION GAP: 10 (ref 5–15)
AST: 28 U/L (ref 15–41)
Albumin: 3.6 g/dL (ref 3.5–5.0)
BILIRUBIN TOTAL: 0.9 mg/dL (ref 0.3–1.2)
BUN: 28 mg/dL — AB (ref 6–20)
CALCIUM: 9.2 mg/dL (ref 8.9–10.3)
CO2: 21 mmol/L — ABNORMAL LOW (ref 22–32)
Chloride: 103 mmol/L (ref 101–111)
Creatinine, Ser: 1.4 mg/dL — ABNORMAL HIGH (ref 0.61–1.24)
GFR calc Af Amer: 53 mL/min — ABNORMAL LOW (ref >60)
GFR calc non Af Amer: 46 mL/min — ABNORMAL LOW (ref >60)
Glucose, Bld: 187 mg/dL — ABNORMAL HIGH (ref 65–99)
POTASSIUM: 4.3 mmol/L (ref 3.5–5.1)
Sodium: 134 mmol/L — ABNORMAL LOW (ref 135–145)
TOTAL PROTEIN: 7 g/dL (ref 6.5–8.1)

## 2017-05-08 LAB — CBC
HEMATOCRIT: 39.1 % (ref 39.0–52.0)
HEMOGLOBIN: 13.6 g/dL (ref 13.0–17.0)
MCH: 32.7 pg (ref 26.0–34.0)
MCHC: 34.8 g/dL (ref 30.0–36.0)
MCV: 94 fL (ref 78.0–100.0)
Platelets: 138 10*3/uL — ABNORMAL LOW (ref 150–400)
RBC: 4.16 MIL/uL — ABNORMAL LOW (ref 4.22–5.81)
RDW: 12.6 % (ref 11.5–15.5)
WBC: 10 10*3/uL (ref 4.0–10.5)

## 2017-05-08 LAB — CBG MONITORING, ED: GLUCOSE-CAPILLARY: 170 mg/dL — AB (ref 65–99)

## 2017-05-08 MED ORDER — CEPHALEXIN 500 MG PO CAPS: 500.0000 mg | ORAL_CAPSULE | Freq: Three times a day (TID) | ORAL | 0 refills | Status: DC

## 2017-05-08 MED ORDER — SODIUM CHLORIDE 0.9 % IV SOLN
1.0000 g | Freq: Once | INTRAVENOUS | Status: AC
  Administered 2017-05-08: 1 g via INTRAVENOUS
  Filled 2017-05-08: qty 10

## 2017-05-08 NOTE — ED Notes (Signed)
Warm compress and pressure applied to pt IV site. Mild warmth and swelling noted. Pt denies pain.

## 2017-05-08 NOTE — ED Notes (Signed)
Pt given urinal for UA

## 2017-05-08 NOTE — ED Notes (Signed)
ED Provider at bedside. 

## 2017-05-08 NOTE — ED Notes (Signed)
Pt given graham crackers and PB per EDP

## 2017-05-08 NOTE — ED Notes (Signed)
Dr Lita Mains aware of pt troponin

## 2017-05-08 NOTE — ED Notes (Signed)
Patient transported to CT 

## 2017-05-08 NOTE — ED Triage Notes (Signed)
Pt from home via EMS after wife called when pt was "not acting himself" and was slow to respond. Per EMS, pt was initially was not answering questions however while en route pt became more talkative and was answering questions appropriately. Pt A&Ox4. Denies pain or any complaints. EMS VS: 168/82, 72 HR, 14 RR, 94% on RA, CBG WDL. 20 G R forearm. Facial symmetry noted. No neuro deficits noted. No slurred speech

## 2017-05-08 NOTE — ED Notes (Signed)
Pt and family asking for update, results, and if pt able to eat. Dr Lita Mains notified.

## 2017-05-08 NOTE — ED Provider Notes (Signed)
[facility] EMERGENCY DEPARTMENT Provider Note   CSN: [id number] Arrival date & time: 05/08/17  1424     History   Chief Complaint Chief Complaint  Patient presents with  . Altered Mental Status    HPI Daniel [name] is a 84 y.o. male.  HPI Patient last seen at his mental baseline around noon today.  Per wife patient was sitting on the couch watching a baseball game and was not responding verbally to her.  This was noted at 0130.  States she checked his blood sugar and it was 220.  She attempted to take his blood pressure was having trouble getting his shirt off.  Called EMS.  When EMS arrived patient was more responsive and became increasingly talkative in route.  Currently the patient denies any symptoms including headache, visual changes, speech changes, focal weakness or numbness.  Denies chest pain, abdominal pain, nausea or vomiting.  No recent illnesses, fever or chills.  No previous history of seizures or stroke. Past Medical History:  Diagnosis Date  . Alcohol abuse   . Cancer [facility])    prostate  . Cardiomyopathy ([place])   . CHF (congestive heart failure) ([place])   . Complication of anesthesia    Hx of seizure when put to sleep for surgery on 08/2009  . Constipation   . Coronary artery disease   . Diabetes mellitus without complication ([place])   . H/O hiatal hernia   . Hyperlipidemia   . Hypertension   . Peripheral neuropathy   . Seizures ([place])   . Urinary incontinence     Patient Active Problem List   Diagnosis Date Noted  . Type II diabetes mellitus with neurological manifestations ([place]) 03/05/2015  . Porokeratosis 03/05/2015  . Chronic systolic CHF (congestive heart failure) ([place]) 01/28/2015  . Coronary artery disease 01/28/2015  . Bruit of left carotid artery 01/28/2015  . Acute on chronic renal failure ([place]) 04/17/2013  . Dehydration 04/17/2013  . Alcohol dependence ([place]) 04/17/2013  . Transaminasemia 04/17/2013  . DM2 (diabetes mellitus,  type 2) ([place]) 04/17/2013  . Near syncope 04/17/2013  . UTI (lower urinary tract infection) 04/17/2013  . Hematuria 04/17/2013    Past Surgical History:  Procedure Laterality Date  . CATARACT EXTRACTION, BILATERAL    . CORONARY ANGIOPLASTY     stents x 2  . CORONARY STENT PLACEMENT     2004  . PROSTATE SURGERY  2008   SEED IMPLANTS  . RADIOACTIVE SEED IMPLANT     prostate  . TONSILLECTOMY     1940's       Home Medications    Prior to Admission medications   Medication Sig Start Date End Date Taking? Authorizing Provider  aspirin 81 MG tablet Take 81 mg by mouth daily.   Yes [provider]  B Complex-C (SUPER B COMPLEX PO) Take 1 tablet by mouth daily.   Yes [provider]  citalopram (CELEXA) 10 MG tablet Take 10 mg by mouth daily.    Yes [provider]  finasteride (PROSCAR) 5 MG tablet Take 5 mg by mouth daily.  01/09/13  Yes [provider]  hydrocortisone 2.5 % cream Apply 1 application topically as needed (face).  05/15/14  Yes [provider]  insulin aspart (NOVOLOG FLEXPEN) 100 UNIT/ML FlexPen Inject 10-25 Units into the skin See admin instructions. Inject 10 units subcutaneously every morning and 25 units at night   Yes [provider]  Insulin Degludec (TRESIBA FLEXTOUCH) 200 UNIT/ML SOPN Inject  30 Units into the skin at bedtime.   Yes [provider]  latanoprost (XALATAN) 0.005 % ophthalmic solution Place 1 drop into both eyes at bedtime.   Yes [provider]  losartan (COZAAR) 50 MG tablet Take 50 mg by mouth daily.  08/07/14  Yes [provider]  Multiple Vitamin (MULTIVITAMIN) tablet Take 1 tablet by mouth daily.   Yes [provider]  rosuvastatin (CRESTOR) 20 MG tablet Take 10 mg by mouth daily.    Yes [provider]  vitamin C (ASCORBIC ACID) 500 MG tablet Take 500 mg by mouth daily.   Yes [provider]  cephALEXin (KEFLEX) 500 MG capsule Take 1  capsule (500 mg total) by mouth 3 (three) times daily. 05/09/17   Julianne Rice, MD    Family History Family History  Problem Relation Age of Onset  . Diabetes Mother   . Heart disease Father   . Heart disease Brother   . Diabetes Brother     Social History Social History   Tobacco Use  . Smoking status: Never Smoker  . Smokeless tobacco: Never Used  Substance Use Topics  . Alcohol use: Yes  . Drug use: No     Allergies   Patient has no known allergies.   Review of Systems Review of Systems  Constitutional: Negative for chills and fatigue.  HENT: Negative for trouble swallowing and voice change.   Eyes: Negative for visual disturbance.  Respiratory: Negative for cough and shortness of breath.   Cardiovascular: Negative for chest pain, palpitations and leg swelling.  Gastrointestinal: Negative for abdominal pain, diarrhea, nausea and vomiting.  Musculoskeletal: Negative for back pain, myalgias and neck pain.  Skin: Negative for rash and wound.  Neurological: Positive for speech difficulty. Negative for dizziness, weakness, light-headedness, numbness and headaches.  Psychiatric/Behavioral: Positive for confusion.  All other systems reviewed and are negative.    Physical Exam Updated Vital Signs BP (!) 134/56   Pulse 66   Temp 98.5 F (36.9 C) (Oral)   Resp 19   Ht 5\' 8"  (1.727 m)   Wt 81.6 kg (180 lb)   SpO2 98%   BMI 27.37 kg/m   Physical Exam  Constitutional: He is oriented to person, place, and time. He appears well-developed and well-nourished. No distress.  HENT:  Head: Normocephalic and atraumatic.  Mouth/Throat: Oropharynx is clear and moist. No oropharyngeal exudate.  No intraoral trauma.  Cranial nerve II through XII grossly intact.  Eyes: EOM are normal. Pupils are equal, round, and reactive to light.  Neck: Normal range of motion. Neck supple.  No meningismus.  Cardiovascular: Normal rate and regular rhythm. Exam reveals no gallop and no  friction rub.  No murmur heard. Pulmonary/Chest: Effort normal and breath sounds normal. No stridor. No respiratory distress. He has no wheezes. He has no rales. He exhibits no tenderness.  Abdominal: Soft. Bowel sounds are normal. There is no tenderness. There is no rebound and no guarding.  Musculoskeletal: Normal range of motion. He exhibits no edema or tenderness.  No midline thoracic or lumbar tenderness.  No lower extremity swelling, asymmetry or tenderness.  Distal pulses are 2+.  Neurological: He is alert and oriented to person, place, and time.  Patient is alert and oriented x3 with clear, goal oriented speech. Patient has 5/5 motor in all extremities. Sensation is intact to light touch. Bilateral finger-to-nose is normal with no signs of dysmetria.  Skin: Skin is warm and dry. Capillary refill takes less than 2  seconds. No rash noted. He is not diaphoretic. No erythema.  Psychiatric: He has a normal mood and affect. His behavior is normal.  Nursing note and vitals reviewed.    ED Treatments / Results  Labs (all labs ordered are listed, but only abnormal results are displayed) Labs Reviewed  COMPREHENSIVE METABOLIC PANEL - Abnormal; Notable for the following components:      Result Value   Sodium 134 (*)    CO2 21 (*)    Glucose, Bld 187 (*)    BUN 28 (*)    Creatinine, Ser 1.40 (*)    GFR calc non Af Amer 46 (*)    GFR calc Af Amer 53 (*)    All other components within normal limits  CBC - Abnormal; Notable for the following components:   RBC 4.16 (*)    Platelets 138 (*)    All other components within normal limits  TROPONIN I - Abnormal; Notable for the following components:   Troponin I 0.04 (*)    All other components within normal limits  URINALYSIS, ROUTINE W REFLEX MICROSCOPIC - Abnormal; Notable for the following components:   Protein, ur 100 (*)    Leukocytes, UA SMALL (*)    Squamous Epithelial / LPF 0-5 (*)    All other components within normal limits    TROPONIN I - Abnormal; Notable for the following components:   Troponin I 0.03 (*)    All other components within normal limits  CBG MONITORING, ED - Abnormal; Notable for the following components:   Glucose-Capillary 170 (*)    All other components within normal limits  URINE CULTURE    EKG  EKG Interpretation  Date/Time:  Saturday May 08 2017 14:32:08 EST Ventricular Rate:  72 PR Interval:    QRS Duration: 114 QT Interval:  424 QTC Calculation: 464 R Axis:   -71 Text Interpretation:  Sinus rhythm Left anterior fascicular block Low voltage, precordial leads Consider anterior infarct Nonspecific T abnormalities, lateral leads Confirmed by Julianne Rice [phone]) on 05/08/2017 3:02:22 PM       Radiology Ct Head Wo Contrast  Result Date: 05/08/2017 CLINICAL DATA:  Altered mental status. EXAM: CT HEAD WITHOUT CONTRAST TECHNIQUE: Contiguous axial images were obtained from the base of the skull through the vertex without intravenous contrast. COMPARISON:  08/26/2009 FINDINGS: Brain: Ventricles and cisterns are within normal. There is mild generalized atrophy and chronic ischemic microvascular disease. There is no mass, mass effect, shift of midline structures or acute hemorrhage. There is no evidence of acute infarction. Vascular: No hyperdense vessel or unexpected calcification. Skull: Normal. Negative for fracture or focal lesion. Sinuses/Orbits: Sinuses are well developed with mild air-fluid level over the left sphenoid sinus. Mastoid air cells are clear. Orbits are normal. Other: None. IMPRESSION: Atrophy and chronic ischemic microvascular disease. Air-fluid level over the left sphenoid sinus which may be due to chronic sedentary state versus acute sinusitis. Electronically Signed   By: Marin Olp M.D.   On: 05/08/2017 16:28    Procedures Procedures (including critical care time)  Medications Ordered in ED Medications  cefTRIAXone (ROCEPHIN) 1 g in sodium chloride 0.9 %  100 mL IVPB (1 g Intravenous New Bag/Given 05/08/17 1852)     Initial Impression / Assessment and Plan / ED Course  I have reviewed the triage vital signs and the nursing notes.  Pertinent labs & imaging results that were available during my care of the patient were reviewed by me and considered in my medical decision  making (see chart for details).    Patient remains at neurologic baseline.  CT head without acute findings.  Patient does have too numerous to count white blood cells in urine.  Suspect urinary tract infection which may be the cause for patient's alteration in mental status.  Urine sent for culture.  Given dose of IV Rocephin.  Will discharge home with a course of Keflex.  Advised to follow-up closely with his primary physician and return precautions have been given.   Final Clinical Impressions(s) / ED Diagnoses   Final diagnoses:  Altered mental status, unspecified altered mental status type  Lower urinary tract infection    ED Discharge Orders        Ordered    cephALEXin (KEFLEX) 500 MG capsule  3 times daily     05/08/17 1948       Julianne Rice, MD 05/08/17 1950

## 2017-05-09 LAB — URINE CULTURE: Culture: <10000 — AB

## 2017-06-07 ENCOUNTER — Ambulatory Visit: Payer: Medicare HMO | Admitting: Podiatry

## 2017-06-07 ENCOUNTER — Telehealth: Payer: Self-pay | Admitting: Podiatry

## 2017-06-07 ENCOUNTER — Encounter: Payer: Self-pay | Admitting: Podiatry

## 2017-06-07 DIAGNOSIS — B351 Tinea unguium: Secondary | ICD-10-CM | POA: Diagnosis not present

## 2017-06-07 DIAGNOSIS — E1149 Type 2 diabetes mellitus with other diabetic neurological complication: Secondary | ICD-10-CM | POA: Diagnosis not present

## 2017-06-07 DIAGNOSIS — Q828 Other specified congenital malformations of skin: Secondary | ICD-10-CM | POA: Diagnosis not present

## 2017-06-07 DIAGNOSIS — M79675 Pain in left toe(s): Secondary | ICD-10-CM

## 2017-06-07 DIAGNOSIS — M79674 Pain in right toe(s): Secondary | ICD-10-CM | POA: Diagnosis not present

## 2017-06-07 NOTE — Progress Notes (Signed)
Patient ID: Daniel [name], male   DOB: [birth year], 84 y.o.   MRN: [id number]  Subjective: Mr. Daniel [name] is a 84 year old male who presents today with his wife for recurrent painful, elongated, thickened toenails which he cannot trim himself. Denies any redness or drainage around the nails. He also has calluses to his feet and  denies any redness or drainage around the areas. Denies any acute changes since last appointment and no new complaints today. Denies any systemic complaints such as fevers, chills, nausea, vomiting.   Objective: AAO 3, NAD DP/PT pulses palpable, CRT less than 3 seconds Protective sensation decreased with Simms Weinstein monofilament Nails hypertrophic, dystrophic, elongated, brittle, discolored 10. There is tenderness overlying the nails 1-5 bilaterally. There is no surrounding erythema or drainage along the nail sites. Hyperkerotic lesions bilateral sub-hallux. Upon debridement of the lesions, no underlying ulceration, drainage, or other signs of infection.  No other areas of tenderness bilateral lower extremities. No overlying edema, erythema, increased warmth. No pain with calf compression, swelling, warmth, erythema. Overall, no changes.   Assessment: Patient presents with symptomatic onychomycosis; hyperkerotic lesions; superficial wound left second toe   Plan: -Treatment options including alternatives, risks, complications were discussed -Nails sharply debrided 10 without complication/bleeding. -Hyperkerotic lesions sharply debrided x 2 without complications/bleeding.   -Follow-up in 3 months for routine care or sooner if any problems are to arise. In the meantime, encouraged to call the office with any questions, concerns, changes symptoms.  Celesta Gentile, DPM

## 2017-06-07 NOTE — Telephone Encounter (Signed)
This is Daniel [name] speaking for my husband who saw Dr. Jacqualyn Posey this morning. I was looking on his medication list we received and it shows cephalexin on there. Would you please remove the cephalexin as he is no longer on this. You don't need to call me back. His date of birth is 1933/05/07. Thank you so much.

## 2017-06-08 NOTE — Addendum Note (Signed)
Addended by: Roney Jaffe on: 06/08/2017 11:41 AM   Modules accepted: Orders

## 2017-06-16 ENCOUNTER — Emergency Department (HOSPITAL_COMMUNITY): Payer: Medicare HMO

## 2017-06-16 ENCOUNTER — Encounter (HOSPITAL_COMMUNITY): Payer: Self-pay

## 2017-06-16 ENCOUNTER — Encounter (HOSPITAL_COMMUNITY): Payer: Medicare HMO

## 2017-06-16 ENCOUNTER — Inpatient Hospital Stay (HOSPITAL_COMMUNITY)
Admission: EM | Admit: 2017-06-16 | Discharge: 2017-06-23 | DRG: 100 | Disposition: A | Discharge status: Skilled Nursing Facility | Payer: Medicare HMO | Attending: Family Medicine | Admitting: Family Medicine

## 2017-06-16 ENCOUNTER — Inpatient Hospital Stay (HOSPITAL_COMMUNITY): Payer: Medicare HMO

## 2017-06-16 DIAGNOSIS — J690 Pneumonitis due to inhalation of food and vomit: Secondary | ICD-10-CM | POA: Diagnosis not present

## 2017-06-16 DIAGNOSIS — E1142 Type 2 diabetes mellitus with diabetic polyneuropathy: Secondary | ICD-10-CM | POA: Diagnosis present

## 2017-06-16 DIAGNOSIS — R531 Weakness: Secondary | ICD-10-CM | POA: Diagnosis not present

## 2017-06-16 DIAGNOSIS — I6523 Occlusion and stenosis of bilateral carotid arteries: Secondary | ICD-10-CM | POA: Diagnosis not present

## 2017-06-16 DIAGNOSIS — Z7982 Long term (current) use of aspirin: Secondary | ICD-10-CM

## 2017-06-16 DIAGNOSIS — Z79899 Other long term (current) drug therapy: Secondary | ICD-10-CM | POA: Diagnosis not present

## 2017-06-16 DIAGNOSIS — E785 Hyperlipidemia, unspecified: Secondary | ICD-10-CM | POA: Diagnosis present

## 2017-06-16 DIAGNOSIS — Z794 Long term (current) use of insulin: Secondary | ICD-10-CM | POA: Diagnosis not present

## 2017-06-16 DIAGNOSIS — R4182 Altered mental status, unspecified: Secondary | ICD-10-CM | POA: Diagnosis not present

## 2017-06-16 DIAGNOSIS — Z8546 Personal history of malignant neoplasm of prostate: Secondary | ICD-10-CM | POA: Diagnosis not present

## 2017-06-16 DIAGNOSIS — N390 Urinary tract infection, site not specified: Secondary | ICD-10-CM | POA: Diagnosis present

## 2017-06-16 DIAGNOSIS — G8384 Todd's paralysis (postepileptic): Secondary | ICD-10-CM | POA: Diagnosis present

## 2017-06-16 DIAGNOSIS — E118 Type 2 diabetes mellitus with unspecified complications: Secondary | ICD-10-CM

## 2017-06-16 DIAGNOSIS — I2510 Atherosclerotic heart disease of native coronary artery without angina pectoris: Secondary | ICD-10-CM | POA: Diagnosis not present

## 2017-06-16 DIAGNOSIS — G40901 Epilepsy, unspecified, not intractable, with status epilepticus: Principal | ICD-10-CM | POA: Diagnosis present

## 2017-06-16 DIAGNOSIS — N401 Enlarged prostate with lower urinary tract symptoms: Secondary | ICD-10-CM | POA: Diagnosis present

## 2017-06-16 DIAGNOSIS — E872 Acidosis: Secondary | ICD-10-CM | POA: Diagnosis present

## 2017-06-16 DIAGNOSIS — I248 Other forms of acute ischemic heart disease: Secondary | ICD-10-CM | POA: Diagnosis not present

## 2017-06-16 DIAGNOSIS — G4089 Other seizures: Secondary | ICD-10-CM | POA: Diagnosis not present

## 2017-06-16 DIAGNOSIS — I130 Hypertensive heart and chronic kidney disease with heart failure and stage 1 through stage 4 chronic kidney disease, or unspecified chronic kidney disease: Secondary | ICD-10-CM | POA: Diagnosis not present

## 2017-06-16 DIAGNOSIS — R410 Disorientation, unspecified: Secondary | ICD-10-CM | POA: Diagnosis not present

## 2017-06-16 DIAGNOSIS — I429 Cardiomyopathy, unspecified: Secondary | ICD-10-CM | POA: Diagnosis not present

## 2017-06-16 DIAGNOSIS — I5022 Chronic systolic (congestive) heart failure: Secondary | ICD-10-CM | POA: Diagnosis present

## 2017-06-16 DIAGNOSIS — J9601 Acute respiratory failure with hypoxia: Secondary | ICD-10-CM | POA: Diagnosis present

## 2017-06-16 DIAGNOSIS — R41841 Cognitive communication deficit: Secondary | ICD-10-CM | POA: Diagnosis not present

## 2017-06-16 DIAGNOSIS — J90 Pleural effusion, not elsewhere classified: Secondary | ICD-10-CM | POA: Diagnosis not present

## 2017-06-16 DIAGNOSIS — I2583 Coronary atherosclerosis due to lipid rich plaque: Secondary | ICD-10-CM | POA: Diagnosis not present

## 2017-06-16 DIAGNOSIS — M6281 Muscle weakness (generalized): Secondary | ICD-10-CM | POA: Diagnosis not present

## 2017-06-16 DIAGNOSIS — R569 Unspecified convulsions: Secondary | ICD-10-CM | POA: Diagnosis not present

## 2017-06-16 DIAGNOSIS — F329 Major depressive disorder, single episode, unspecified: Secondary | ICD-10-CM | POA: Diagnosis present

## 2017-06-16 DIAGNOSIS — E1122 Type 2 diabetes mellitus with diabetic chronic kidney disease: Secondary | ICD-10-CM | POA: Diagnosis not present

## 2017-06-16 DIAGNOSIS — Z66 Do not resuscitate: Secondary | ICD-10-CM | POA: Diagnosis not present

## 2017-06-16 DIAGNOSIS — N183 Chronic kidney disease, stage 3 (moderate): Secondary | ICD-10-CM | POA: Diagnosis present

## 2017-06-16 DIAGNOSIS — R748 Abnormal levels of other serum enzymes: Secondary | ICD-10-CM | POA: Diagnosis not present

## 2017-06-16 DIAGNOSIS — I10 Essential (primary) hypertension: Secondary | ICD-10-CM | POA: Diagnosis not present

## 2017-06-16 DIAGNOSIS — I509 Heart failure, unspecified: Secondary | ICD-10-CM | POA: Diagnosis not present

## 2017-06-16 DIAGNOSIS — E11649 Type 2 diabetes mellitus with hypoglycemia without coma: Secondary | ICD-10-CM | POA: Diagnosis present

## 2017-06-16 DIAGNOSIS — I361 Nonrheumatic tricuspid (valve) insufficiency: Secondary | ICD-10-CM | POA: Diagnosis not present

## 2017-06-16 DIAGNOSIS — Z955 Presence of coronary angioplasty implant and graft: Secondary | ICD-10-CM | POA: Diagnosis not present

## 2017-06-16 DIAGNOSIS — I6789 Other cerebrovascular disease: Secondary | ICD-10-CM | POA: Diagnosis not present

## 2017-06-16 DIAGNOSIS — R338 Other retention of urine: Secondary | ICD-10-CM | POA: Diagnosis not present

## 2017-06-16 DIAGNOSIS — E1149 Type 2 diabetes mellitus with other diabetic neurological complication: Secondary | ICD-10-CM | POA: Diagnosis not present

## 2017-06-16 DIAGNOSIS — R4020 Unspecified coma: Secondary | ICD-10-CM | POA: Diagnosis not present

## 2017-06-16 DIAGNOSIS — R29810 Facial weakness: Secondary | ICD-10-CM | POA: Diagnosis not present

## 2017-06-16 DIAGNOSIS — I252 Old myocardial infarction: Secondary | ICD-10-CM

## 2017-06-16 DIAGNOSIS — R2689 Other abnormalities of gait and mobility: Secondary | ICD-10-CM | POA: Diagnosis not present

## 2017-06-16 LAB — COMPREHENSIVE METABOLIC PANEL
ALBUMIN: 3.7 g/dL (ref 3.5–5.0)
ALK PHOS: 55 U/L (ref 38–126)
ALT: 21 U/L (ref 17–63)
ANION GAP: 16 — AB (ref 5–15)
AST: 34 U/L (ref 15–41)
BUN: 35 mg/dL — ABNORMAL HIGH (ref 6–20)
CALCIUM: 9.2 mg/dL (ref 8.9–10.3)
CHLORIDE: 108 mmol/L (ref 101–111)
CO2: 13 mmol/L — AB (ref 22–32)
Creatinine, Ser: 1.48 mg/dL — ABNORMAL HIGH (ref 0.61–1.24)
GFR calc non Af Amer: 43 mL/min — ABNORMAL LOW (ref >60)
GFR, EST AFRICAN AMERICAN: 49 mL/min — AB (ref >60)
GLUCOSE: 138 mg/dL — AB (ref 65–99)
Potassium: 4.6 mmol/L (ref 3.5–5.1)
SODIUM: 137 mmol/L (ref 135–145)
Total Bilirubin: 0.9 mg/dL (ref 0.3–1.2)
Total Protein: 7.1 g/dL (ref 6.5–8.1)

## 2017-06-16 LAB — I-STAT ARTERIAL BLOOD GAS, ED
Acid-base deficit: 6 mmol/L — ABNORMAL HIGH (ref 0.0–2.0)
Bicarbonate: 21.3 mmol/L (ref 20.0–28.0)
O2 SAT: 100 %
PCO2 ART: 48.2 mmHg — AB (ref 32.0–48.0)
TCO2: 23 mmol/L (ref 22–32)
pH, Arterial: 7.252 — ABNORMAL LOW (ref 7.350–7.450)
pO2, Arterial: 317 mmHg — ABNORMAL HIGH (ref 83.0–108.0)

## 2017-06-16 LAB — CBG MONITORING, ED: Glucose-Capillary: 122 mg/dL — ABNORMAL HIGH (ref 65–99)

## 2017-06-16 LAB — APTT: APTT: 31 s (ref 24–36)

## 2017-06-16 LAB — I-STAT CHEM 8, ED
BUN: 36 mg/dL — AB (ref 6–20)
CHLORIDE: 109 mmol/L (ref 101–111)
Calcium, Ion: 1.19 mmol/L (ref 1.15–1.40)
Creatinine, Ser: 1.4 mg/dL — ABNORMAL HIGH (ref 0.61–1.24)
Glucose, Bld: 130 mg/dL — ABNORMAL HIGH (ref 65–99)
HEMATOCRIT: 40 % (ref 39.0–52.0)
Hemoglobin: 13.6 g/dL (ref 13.0–17.0)
POTASSIUM: 4.6 mmol/L (ref 3.5–5.1)
SODIUM: 139 mmol/L (ref 135–145)
TCO2: 16 mmol/L — ABNORMAL LOW (ref 22–32)

## 2017-06-16 LAB — I-STAT TROPONIN, ED: Troponin i, poc: 0.04 ng/mL (ref 0.00–0.08)

## 2017-06-16 LAB — GLUCOSE, CAPILLARY
GLUCOSE-CAPILLARY: 114 mg/dL — AB (ref 65–99)
Glucose-Capillary: 124 mg/dL — ABNORMAL HIGH (ref 65–99)
Glucose-Capillary: 106 mg/dL — ABNORMAL HIGH (ref 65–99)

## 2017-06-16 LAB — DIFFERENTIAL
BASOS ABS: 0 10*3/uL (ref 0.0–0.1)
Basophils Relative: 0 %
EOS ABS: 0.9 10*3/uL — AB (ref 0.0–0.7)
Eosinophils Relative: 5 %
LYMPHS PCT: 56 %
Lymphs Abs: 9.8 10*3/uL — ABNORMAL HIGH (ref 0.7–4.0)
MONOS PCT: 9 %
Monocytes Absolute: 1.6 10*3/uL — ABNORMAL HIGH (ref 0.1–1.0)
NEUTROS ABS: 5.3 10*3/uL (ref 1.7–7.7)
NEUTROS PCT: 30 %

## 2017-06-16 LAB — PROTIME-INR
INR: 1.19
PROTHROMBIN TIME: 15 s (ref 11.4–15.2)

## 2017-06-16 LAB — TROPONIN I: TROPONIN I: 0.06 ng/mL — AB (ref <0.03)

## 2017-06-16 LAB — CBC
HCT: 41.1 % (ref 39.0–52.0)
HEMOGLOBIN: 13.8 g/dL (ref 13.0–17.0)
MCH: 32.5 pg (ref 26.0–34.0)
MCHC: 33.6 g/dL (ref 30.0–36.0)
MCV: 96.7 fL (ref 78.0–100.0)
PLATELETS: 156 10*3/uL (ref 150–400)
RBC: 4.25 MIL/uL (ref 4.22–5.81)
RDW: 12.7 % (ref 11.5–15.5)
WBC: 17.6 10*3/uL — ABNORMAL HIGH (ref 4.0–10.5)

## 2017-06-16 LAB — MRSA PCR SCREENING: MRSA by PCR: NEGATIVE

## 2017-06-16 LAB — ETHANOL: Alcohol, Ethyl (B): <10 mg/dL (ref <10)

## 2017-06-16 MED ORDER — SODIUM CHLORIDE 0.9 % IV SOLN
750.0000 mg | Freq: Two times a day (BID) | INTRAVENOUS | Status: DC
  Administered 2017-06-16 – 2017-06-17 (×2): 750 mg via INTRAVENOUS
  Filled 2017-06-16 (×2): qty 7.5

## 2017-06-16 MED ORDER — CITALOPRAM HYDROBROMIDE 10 MG PO TABS
10.0000 mg | ORAL_TABLET | Freq: Every day | ORAL | Status: DC
  Administered 2017-06-16 – 2017-06-23 (×8): 10 mg via ORAL
  Filled 2017-06-16 (×8): qty 1

## 2017-06-16 MED ORDER — LACTATED RINGERS IV SOLN
INTRAVENOUS | Status: DC
  Administered 2017-06-16 – 2017-06-17 (×2): via INTRAVENOUS

## 2017-06-16 MED ORDER — PROPOFOL 1000 MG/100ML IV EMUL
INTRAVENOUS | Status: AC
  Filled 2017-06-16: qty 100

## 2017-06-16 MED ORDER — FENTANYL CITRATE (PF) 100 MCG/2ML IJ SOLN
50.0000 ug | INTRAMUSCULAR | Status: DC | PRN
  Administered 2017-06-17: 50 ug via INTRAVENOUS
  Filled 2017-06-16: qty 2

## 2017-06-16 MED ORDER — ORAL CARE MOUTH RINSE
15.0000 mL | Freq: Four times a day (QID) | OROMUCOSAL | Status: DC
  Administered 2017-06-16 – 2017-06-17 (×4): 15 mL via OROMUCOSAL

## 2017-06-16 MED ORDER — LORAZEPAM 2 MG/ML IJ SOLN
2.0000 mg | Freq: Once | INTRAMUSCULAR | Status: AC
  Administered 2017-06-16: 2 mg via INTRAVENOUS

## 2017-06-16 MED ORDER — PROPOFOL 1000 MG/100ML IV EMUL
5.0000 ug/kg/min | INTRAVENOUS | Status: DC
  Administered 2017-06-16 – 2017-06-17 (×2): 10 ug/kg/min via INTRAVENOUS
  Filled 2017-06-16: qty 100

## 2017-06-16 MED ORDER — MIDAZOLAM HCL 2 MG/2ML IJ SOLN: 1.0000 mg | INTRAMUSCULAR | Status: DC | PRN

## 2017-06-16 MED ORDER — LATANOPROST 0.005 % OP SOLN
1.0000 [drp] | Freq: Every day | OPHTHALMIC | Status: DC
  Administered 2017-06-16 – 2017-06-22 (×6): 1 [drp] via OPHTHALMIC
  Filled 2017-06-16: qty 2.5

## 2017-06-16 MED ORDER — CHLORHEXIDINE GLUCONATE 0.12% ORAL RINSE (MEDLINE KIT)
15.0000 mL | Freq: Two times a day (BID) | OROMUCOSAL | Status: DC
  Administered 2017-06-16 – 2017-06-17 (×2): 15 mL via OROMUCOSAL

## 2017-06-16 MED ORDER — PANTOPRAZOLE SODIUM 40 MG IV SOLR
40.0000 mg | Freq: Every day | INTRAVENOUS | Status: DC
  Administered 2017-06-16: 40 mg via INTRAVENOUS
  Filled 2017-06-16: qty 40

## 2017-06-16 MED ORDER — LORAZEPAM 2 MG/ML IJ SOLN
INTRAMUSCULAR | Status: AC
  Filled 2017-06-16: qty 1

## 2017-06-16 MED ORDER — LEVETIRACETAM IN NACL 1500 MG/100ML IV SOLN
1500.0000 mg | Freq: Once | INTRAVENOUS | Status: AC
Start: 2017-06-16 — End: 2017-06-16
  Administered 2017-06-16: 1500 mg via INTRAVENOUS
  Filled 2017-06-16: qty 100

## 2017-06-16 MED ORDER — PIPERACILLIN-TAZOBACTAM 3.375 G IVPB
3.3750 g | Freq: Three times a day (TID) | INTRAVENOUS | Status: DC
  Administered 2017-06-16 – 2017-06-17 (×2): 3.375 g via INTRAVENOUS
  Filled 2017-06-16 (×4): qty 50

## 2017-06-16 MED ORDER — IOPAMIDOL (ISOVUE-370) INJECTION 76%
INTRAVENOUS | Status: AC
  Administered 2017-06-16: 50 mL
  Filled 2017-06-16: qty 100

## 2017-06-16 MED ORDER — FENTANYL CITRATE (PF) 100 MCG/2ML IJ SOLN: 50.0000 ug | INTRAMUSCULAR | Status: DC | PRN

## 2017-06-16 MED ORDER — PROPOFOL 1000 MG/100ML IV EMUL
5.0000 ug/kg/min | Freq: Once | INTRAVENOUS | Status: AC
  Administered 2017-06-16: 10 ug/kg/min via INTRAVENOUS

## 2017-06-16 MED ORDER — FINASTERIDE 5 MG PO TABS
5.0000 mg | ORAL_TABLET | Freq: Every day | ORAL | Status: DC
  Administered 2017-06-16 – 2017-06-23 (×8): 5 mg via ORAL
  Filled 2017-06-16 (×8): qty 1

## 2017-06-16 MED ORDER — PIPERACILLIN-TAZOBACTAM 3.375 G IVPB 30 MIN
3.3750 g | Freq: Once | INTRAVENOUS | Status: AC
  Administered 2017-06-16: 3.375 g via INTRAVENOUS
  Filled 2017-06-16: qty 50

## 2017-06-16 MED ORDER — FENTANYL CITRATE (PF) 100 MCG/2ML IJ SOLN
INTRAMUSCULAR | Status: AC | PRN
  Administered 2017-06-16: 50 ug via INTRAVENOUS

## 2017-06-16 MED ORDER — SODIUM CHLORIDE 0.9 % IV SOLN
INTRAVENOUS | Status: AC | PRN
  Administered 2017-06-16 (×2): 1000 mL via INTRAVENOUS

## 2017-06-16 MED ORDER — LOSARTAN POTASSIUM 50 MG PO TABS
50.0000 mg | ORAL_TABLET | Freq: Every day | ORAL | Status: DC
  Administered 2017-06-17: 50 mg via ORAL
  Filled 2017-06-16 (×2): qty 1

## 2017-06-16 MED ORDER — ALTEPLASE (STROKE) FULL DOSE INFUSION
0.9000 mg/kg | Freq: Once | INTRAVENOUS | Status: DC
  Filled 2017-06-16: qty 100

## 2017-06-16 MED ORDER — SODIUM CHLORIDE 0.9 % IV SOLN: 250.0000 mL | INTRAVENOUS | Status: DC | PRN

## 2017-06-16 MED ORDER — INSULIN ASPART 100 UNIT/ML ~~LOC~~ SOLN
0.0000 [IU] | Freq: Three times a day (TID) | SUBCUTANEOUS | Status: DC
  Administered 2017-06-17 – 2017-06-19 (×3): 1 [IU] via SUBCUTANEOUS
  Administered 2017-06-19 – 2017-06-20 (×2): 2 [IU] via SUBCUTANEOUS
  Administered 2017-06-20 – 2017-06-21 (×2): 1 [IU] via SUBCUTANEOUS
  Administered 2017-06-21: 3 [IU] via SUBCUTANEOUS
  Administered 2017-06-21: 2 [IU] via SUBCUTANEOUS
  Administered 2017-06-22: 1 [IU] via SUBCUTANEOUS
  Administered 2017-06-22: 2 [IU] via SUBCUTANEOUS
  Administered 2017-06-22: 1 [IU] via SUBCUTANEOUS
  Administered 2017-06-23: 2 [IU] via SUBCUTANEOUS

## 2017-06-16 MED ORDER — LABETALOL HCL 5 MG/ML IV SOLN
INTRAVENOUS | Status: AC
  Administered 2017-06-16: 20 mg
  Filled 2017-06-16: qty 4

## 2017-06-16 MED ORDER — ROSUVASTATIN CALCIUM 10 MG PO TABS
10.0000 mg | ORAL_TABLET | Freq: Every day | ORAL | Status: DC
  Filled 2017-06-16: qty 1

## 2017-06-16 MED ORDER — SUCCINYLCHOLINE CHLORIDE 20 MG/ML IJ SOLN
INTRAMUSCULAR | Status: AC | PRN
  Administered 2017-06-16: 130 mg via INTRAVENOUS

## 2017-06-16 MED ORDER — FENTANYL CITRATE (PF) 100 MCG/2ML IJ SOLN
INTRAMUSCULAR | Status: DC | PRN
  Administered 2017-06-16: 50 ug via INTRAVENOUS

## 2017-06-16 MED ORDER — HEPARIN SODIUM (PORCINE) 5000 UNIT/ML IJ SOLN
5000.0000 [IU] | Freq: Three times a day (TID) | INTRAMUSCULAR | Status: DC
  Administered 2017-06-16 – 2017-06-17 (×4): 5000 [IU] via SUBCUTANEOUS
  Filled 2017-06-16 (×5): qty 1

## 2017-06-16 MED ORDER — ROSUVASTATIN CALCIUM 5 MG PO TABS
10.0000 mg | ORAL_TABLET | Freq: Every day | ORAL | Status: DC
  Administered 2017-06-17 – 2017-06-22 (×5): 10 mg via ORAL
  Filled 2017-06-16 (×3): qty 2
  Filled 2017-06-16: qty 1
  Filled 2017-06-16 (×2): qty 2

## 2017-06-16 MED ORDER — ASPIRIN EC 81 MG PO TBEC
81.0000 mg | DELAYED_RELEASE_TABLET | Freq: Every day | ORAL | Status: DC
  Administered 2017-06-16: 81 mg via ORAL
  Filled 2017-06-16 (×2): qty 1

## 2017-06-16 MED ORDER — VITAMIN C 500 MG PO TABS
500.0000 mg | ORAL_TABLET | Freq: Every day | ORAL | Status: DC
  Administered 2017-06-17 – 2017-06-23 (×7): 500 mg via ORAL
  Filled 2017-06-16 (×7): qty 1

## 2017-06-16 NOTE — Significant Event (Signed)
PCCM Interval Note  Called to bedside per RN at request of family to discussion code status and prior known wishes.  State Gold DNR form present.  Called and spoke to patient's wife, Daniel [name] who states that patient does wish for a natural death and the only reason they agreed to intubation in the ER is because they were told his state DNR form did not say DNI and they thought this was a reversible process and agreed to intubation.  Clarified with wife, that we will continue mechanical ventilation, however if patient were to decompensate, we would not offer CPR, ACLS, or defibrillation and states these are patient's and her wishes in which we will honor.  P: Code status changed to DNR After critical process resolves, will need to clarify with patient and wife which state form, either DNR or MOST form, is appropriate based on patient's wishes.    Daniel [name], AGACNP-BC [facility] Pgr: [phone] or if no answer [phone] 06/16/2017, 8:36 PM

## 2017-06-16 NOTE — Plan of Care (Signed)

## 2017-06-16 NOTE — Progress Notes (Signed)
EEG machine out side of the room and off. EEG tech paged to come set up for 24 hour continuous monitoring, no answer, message left. Dr. Lorraine Lax at bedside to check why he was unable to see readings, informed him machine was not set up and the EEG tech would need to come back to set up.

## 2017-06-16 NOTE — Progress Notes (Signed)
Pt transferred from ED to 19M on vent with no complications, NARD VSS. Report given to RT will cont to monitor.

## 2017-06-16 NOTE — ED Provider Notes (Signed)
[place] EMERGENCY DEPARTMENT Provider Note   CSN: [id number] Arrival date & time: 06/16/17  1248     History   Chief Complaint Chief Complaint  Patient presents with  . Code Stroke    HPI Daniel [name] is a 84 y.o. male.  HPI  84 year old man history of alcohol abuse, congestive heart failure, coronary artery disease, diabetes, and seizures presents today with reports that he was last known normal at 1115.  His wife reports he then became weak on his right size with gaze to the right.  EMS reports finding him with weakness on the right side.  In route he had a generalized seizure and became unresponsive.     Past Medical History:  Diagnosis Date  . Alcohol abuse   . Cancer [facility])    prostate  . Cardiomyopathy ([place])   . CHF (congestive heart failure) ([place])   . Complication of anesthesia    Hx of seizure when put to sleep for surgery on 08/2009  . Constipation   . Coronary artery disease   . Diabetes mellitus without complication ([place])   . H/O hiatal hernia   . Hyperlipidemia   . Hypertension   . Peripheral neuropathy   . Seizures ([place])   . Urinary incontinence     Patient Active Problem List   Diagnosis Date Noted  . Type II diabetes mellitus with neurological manifestations ([place]) 03/05/2015  . Porokeratosis 03/05/2015  . Chronic systolic CHF (congestive heart failure) ([place]) 01/28/2015  . Coronary artery disease 01/28/2015  . Bruit of left carotid artery 01/28/2015  . Acute on chronic renal failure ([place]) 04/17/2013  . Dehydration 04/17/2013  . Alcohol dependence ([place]) 04/17/2013  . Transaminasemia 04/17/2013  . DM2 (diabetes mellitus, type 2) ([place]) 04/17/2013  . Near syncope 04/17/2013  . UTI (lower urinary tract infection) 04/17/2013  . Hematuria 04/17/2013    Past Surgical History:  Procedure Laterality Date  . CATARACT EXTRACTION, BILATERAL    . CORONARY ANGIOPLASTY     stents x 2  . CORONARY STENT PLACEMENT     2004  .  PROSTATE SURGERY  2008   SEED IMPLANTS  . RADIOACTIVE SEED IMPLANT     prostate  . TONSILLECTOMY     1940's        Home Medications    Prior to Admission medications   Medication Sig Start Date End Date Taking? Authorizing Provider  aspirin 81 MG tablet Take 81 mg by mouth daily.    [provider]  B Complex-C (SUPER B COMPLEX PO) Take 1 tablet by mouth daily.    [provider]  citalopram (CELEXA) 10 MG tablet Take 10 mg by mouth daily.     [provider]  finasteride (PROSCAR) 5 MG tablet Take 5 mg by mouth daily.  01/09/13   [provider]  hydrocortisone 2.5 % cream Apply 1 application topically as needed (face).  05/15/14   [provider]  insulin aspart (NOVOLOG FLEXPEN) 100 UNIT/ML FlexPen Inject 10-25 Units into the skin See admin instructions. Inject 10 units subcutaneously every morning and 25 units at night    [provider]  Insulin Degludec (TRESIBA FLEXTOUCH) 200 UNIT/ML SOPN Inject 30 Units into the skin at bedtime.    [provider]  latanoprost (XALATAN) 0.005 % ophthalmic solution Place 1 drop into both eyes at bedtime.    [provider]  losartan (COZAAR) 50 MG tablet Take 50 mg by mouth daily.  08/07/14  [provider]  Multiple Vitamin (MULTIVITAMIN) tablet Take 1 tablet by mouth daily.    [provider]  rosuvastatin (CRESTOR) 20 MG tablet Take 10 mg by mouth daily.     [provider]  vitamin C (ASCORBIC ACID) 500 MG tablet Take 500 mg by mouth daily.    [provider]    Family History Family History  Problem Relation Age of Onset  . Diabetes Mother   . Heart disease Father   . Heart disease Brother   . Diabetes Brother     Social History Social History   Tobacco Use  . Smoking status: Never Smoker  . Smokeless tobacco: Never Used  Substance Use Topics  . Alcohol use: Yes  . Drug use: No     Allergies   Patient has no known  allergies.   Review of Systems Review of Systems  Unable to perform ROS: Acuity of condition     Physical Exam Updated Vital Signs BP (!) 150/60   Pulse 93   Resp 18   SpO2 98%   Physical Exam  Constitutional: He appears well-developed and well-nourished.  HENT:  Head: Normocephalic and atraumatic.  Right Ear: External ear normal.  Left Ear: External ear normal.  Nose: Nose normal.  Mouth/Throat: Oropharynx is clear and moist.  Eyes: Pupils are equal, round, and reactive to light.  Roving eye movements  Neck: No JVD present. No tracheal deviation present.  Cardiovascular: Normal rate.  Pulmonary/Chest: Effort normal.  Abdominal: Soft. Bowel sounds are normal.  Musculoskeletal: Normal range of motion.  Neurological:  Patient unresponsive with sonorous respirations Intermittently appears to have some the Serevent posturing  Skin: Skin is warm.  Nursing note and vitals reviewed.    ED Treatments / Results  Labs (all labs ordered are listed, but only abnormal results are displayed) Labs Reviewed  CBC - Abnormal; Notable for the following components:      Result Value   WBC 17.6 (*)    All other components within normal limits  I-STAT CHEM 8, ED - Abnormal; Notable for the following components:   BUN 36 (*)    Creatinine, Ser 1.40 (*)    Glucose, Bld 130 (*)    TCO2 16 (*)    All other components within normal limits  CBG MONITORING, ED - Abnormal; Notable for the following components:   Glucose-Capillary 122 (*)    All other components within normal limits  DIFFERENTIAL  ETHANOL  PROTIME-INR  APTT  COMPREHENSIVE METABOLIC PANEL  RAPID URINE DRUG SCREEN, HOSP PERFORMED  URINALYSIS, ROUTINE W REFLEX MICROSCOPIC  I-STAT TROPONIN, ED    EKG EKG Interpretation  Date/Time:  Wednesday June 16 2017 12:55:20 EDT Ventricular Rate:  83 PR Interval:    QRS Duration: 118 QT Interval:  405 QTC Calculation: 476 R Axis:   -77 Text Interpretation:  Sinus or  ectopic atrial rhythm Left anterior fascicular block Low voltage, precordial leads Confirmed by Pattricia Boss [phone]) on 06/16/2017 2:27:03 PM   Radiology No results found.  Procedures Procedures (including critical care time)  Medications Ordered in ED Medications  succinylcholine (ANECTINE) injection (130 mg Intravenous Given 06/16/17 1301)  fentaNYL (SUBLIMAZE) injection (50 mcg Intravenous Given 06/16/17 1306)  0.9 %  sodium chloride infusion (1,000 mLs Intravenous New Bag/Given 06/16/17 1300)     Initial Impression / Assessment and Plan / ED Course  I have reviewed the triage vital signs and the nursing notes.  Pertinent labs & imaging results that were available  during my care of the patient were reviewed by me and considered in my medical decision making (see chart for details).     Patient is DO NOT RESUSCITATE but DO NOT INTUBATE.  Decision made with Dr. Malen Gauze, and patient's wife to intubate here in ED for airway protection while patient is being evaluated.  CT and MR performed without evidence of stroke.  Per Dr. Sandrea Matte patient began moving all 4 extremities in mri.  Discussed with Critical care and will see for admission  CRITICAL CARE Performed by: Pattricia Boss Total critical care time: 45 minutes Critical care time was exclusive of separately billable procedures and treating other patients. Critical care was necessary to treat or prevent imminent or life-threatening deterioration. Critical care was time spent personally by me on the following activities: development of treatment plan with patient and/or surrogate as well as nursing, discussions with consultants, evaluation of patient's response to treatment, examination of patient, obtaining history from patient or surrogate, ordering and performing treatments and interventions, ordering and review of laboratory studies, ordering and review of radiographic studies, pulse oximetry and re-evaluation of patient's  condition.  Final Clinical Impressions(s) / ED Diagnoses   Final diagnoses:  Altered mental status, unspecified altered mental status type    ED Discharge Orders    None       Pattricia Boss, MD 06/16/17 1430

## 2017-06-16 NOTE — Consult Note (Addendum)
Requesting Physician: EDP    Chief Complaint: Right sided weakness, AMS, seizure  History obtained from: Family and chart review  HPI:                                                                                                                                       Daniel [name] is an 84 y.o. male with pmhx of CAD, HFrEF, IDDM, CKD, tonic clonic seizure 15 years ago, and prior alcohol abuse (sober 4 years). Wife reports patient was eating breakfast at 11:15 this morning when she noticed that he dropped his fork in his lap. He developed weakness on the right side and was unable to grasp his fork. He subsequently developed facial droop, dysarthria, and AMS. On EMS arrival he was alert and oriented x 1. BP was 924Q systolic and noted to have gaze deviation to the right. Patient then had a witnessed seizure en route. He was unresponsive on arrival and intubated urgently in the ED for airway protection. Code stroke was initiated. CT head was negative for bleed. CTA did not show large vessel occlusion. Prior to tPA administration, patient began moving both upper extremities spontaneously with right arm stiffness concerning for recurrent seizure. Fibrinolytics were held and ativan was administered. STAT MRI was negative for CVA.   Patient was in his normal state of health prior to this morning. Wife reports at baseline he is alert, oriented, and conversational. He walks with a walker and feeds himself. Does require assistance with some ADLs including bathing. He has a history of tonic clonic seizure 15 years ago requiring intubation and ICU admission. This was in the setting of alcohol  abuse. He does not take antiepileptics. He has been sober now for 4 years.   Date last known well: 06/16/17 Time last known well: 11:15 am tPA Given: No  NIHSS: 30  Baseline MRS +3   Past Medical History:  Diagnosis Date  . Alcohol abuse   . Cancer [facility])    prostate  . Cardiomyopathy ([place])   . CHF (congestive  heart failure) ([place])   . Complication of anesthesia    Hx of seizure when put to sleep for surgery on 08/2009  . Constipation   . Coronary artery disease   . Diabetes mellitus without complication ([place])   . H/O hiatal hernia   . Hyperlipidemia   . Hypertension   . Peripheral neuropathy   . Seizures ([place])   . Urinary incontinence     Past Surgical History:  Procedure Laterality Date  . CATARACT EXTRACTION, BILATERAL    . CORONARY ANGIOPLASTY     stents x 2  . CORONARY STENT PLACEMENT     2004  . PROSTATE SURGERY  2008   SEED IMPLANTS  . RADIOACTIVE SEED IMPLANT     prostate  . TONSILLECTOMY     1940's    Family History  Problem Relation Age of  Onset  . Diabetes Mother   . Heart disease Father   . Heart disease Brother   . Diabetes Brother    Social History:  reports that he has never smoked. He has never used smokeless tobacco. He reports that he drinks alcohol. He reports that he does not use drugs.  Allergies: No Known Allergies  Medications:                                                                                                                        I reviewed home medications   ROS:                                                                                                                                     14 systems reviewed and negative except above    Examination:                                                                                                      General: Intubated & sedated  Psych: Affect appropriate to situation Eyes: No scleral injection HENT: No OP obstrucion Head: Normocephalic.  Cardiovascular: Normal rate and regular rhythm.  Respiratory: On vent GI: Soft.  No distension. There is no tenderness.  Skin: WDI    Neurological Examination Mental Status: Unresponsive on vent  Cranial Nerves: III,IV, VI: ptosis not present, bilateral eye rolling, pupils 3 mm, responsive to light but sluggish  V,VII: Biting ET tube,  no gross facial deficits but difficult to assess on vent Motor: Does not withdrawal any extremity to painful stimuli, no spontaneous movement Tone and bulk: normal tone throughout; no atrophy noted Sensory: unable to assess Cerebellar: unable to assess Gait: deferred    Lab Results: Basic Metabolic Panel: Recent Labs  Lab 06/16/17 1251 06/16/17 1257  NA 137 139  K 4.6 4.6  CL 108 109  CO2 13*  --   GLUCOSE 138* 130*  BUN 35* 36*  CREATININE 1.48* 1.40*  CALCIUM 9.2  --  CBC: Recent Labs  Lab 06/16/17 1251 06/16/17 1257  WBC 17.6*  --   NEUTROABS 5.3  --   HGB 13.8 13.6  HCT 41.1 40.0  MCV 96.7  --   PLT 156  --     Coagulation Studies: Recent Labs    06/16/17 1251  LABPROT 15.0  INR 1.19    Imaging: Ct Angio Head W Or Wo Contrast  Result Date: 06/16/2017 CLINICAL DATA:  Right-sided weakness.  Weakness prior to seizure. EXAM: CT ANGIOGRAPHY HEAD AND NECK TECHNIQUE: Multidetector CT imaging of the head and neck was performed using the standard protocol during bolus administration of intravenous contrast. Multiplanar CT image reconstructions and MIPs were obtained to evaluate the vascular anatomy. Carotid stenosis measurements (when applicable) are obtained utilizing NASCET criteria, using the distal internal carotid diameter as the denominator. CONTRAST:  63mL ISOVUE-370 IOPAMIDOL (ISOVUE-370) INJECTION 76% COMPARISON:  CT head without contrast from the same day. FINDINGS: CTA NECK FINDINGS Aortic arch: There is a common origin of the left common carotid artery in the innominate artery. Atherosclerotic changes are noted at the origin the great vessels and in the arch without aneurysm or significant focal stenosis. Right carotid system: Atherosclerotic calcifications are noted along the course of the right common carotid artery without a significant luminal stenosis proximal to the bifurcation. There is a high-grade stenosis at the bifurcation with narrowing of the  lumen between calcified plaque to less than 1 mm. This constitutes a greater than 70% stenosis relative to the more distal vessel. No other tandem stenosis is present in the cervical right ICA. There is some tortuosity just below the skull base without focal stenosis. Left carotid system: The left common carotid artery demonstrates distal mural calcification without significant stenosis proximal to the bifurcation. There is dense calcified plaque at the bifurcation which narrows the lumen to approximately 1 mm. The more distal vessel measures 4.5 mm. This constitutes a severe stenosis of greater than 70% relative to the more distal vessel. No tandem stenosis is present in the cervical left ICA. There is tortuosity just proximal skull base. Vertebral arteries: Dense calcifications are present at the origin of the vertebral arteries from the subclavian arteries bilaterally. The left vertebral artery is slightly dominant to the right. No other tandem stenoses are present in the neck. Skeleton: Multilevel degenerative changes are present in the cervical spine. There is chronic loss of disc height at all levels from C3-4 through C6-7. Slight retrolisthesis is present at C3-4 and C4-5. Uncovertebral spurring contributes to foraminal narrowing greatest at C3-4 and C6-7. No focal lytic or blastic lesions are present. Other neck: The patient is intubated. The endotracheal tube terminates only 2.5 cm above the carina and could be pulled back 1-2 cm for more optimal positioning. NG tube is in a dilated esophagus. Thyroid is unremarkable. No significant adenopathy is present. Salivary glands are within normal limits. Upper chest: The lung apices demonstrate mild dependent atelectasis. No focal nodule or mass lesion is present. Review of the MIP images confirms the above findings CTA HEAD FINDINGS Anterior circulation: Atherosclerotic calcifications result in moderate stenoses of the cavernous internal carotid arteries  bilaterally. The ICA termini are normal. The A1 and M1 segments are normal. The anterior communicating artery is patent. MCA bifurcations are intact. ACA and MCA branch vessels are within normal limits. Posterior circulation: Below the dura. The left PICA originates above the dura. The vertebrobasilar junction is normal. The left posterior cerebral artery is of fetal type. The right posterior  cerebral artery originates from the basilar tip. PCA branch vessels are within normal limits bilaterally. Venous sinuses: The dural sinuses are patent. The right transverse sinus is dominant. The straight sinus and deep cerebral veins are intact. Cortical veins are unremarkable. Anatomic variants: Fetal type left posterior cerebral artery. Review of the MIP images confirms the above findings IMPRESSION: 1. No emergent large vessel occlusion. 2. High-grade stenosis at the carotid bifurcations bilaterally. 3. Moderate stenoses within the cavernous internal carotid arteries bilaterally. 4. High-grade stenosis at the origin of the vertebral arteries bilaterally. 5. Fetal type left posterior cerebral artery. 6. Normal variant circle of Willis without significant proximal stenosis, aneurysm, or branch vessel occlusion above the supraclinoid segments. 7. Multilevel spondylosis of the cervical spine as described. Foraminal disease is greatest at C3-4 and C6-7. 8. The endotracheal tube terminates only 2.5 cm above the carina and could be pulled back 1-2 cm for more optimal positioning. 9.  Aortic Atherosclerosis (ICD10-I70.0). Electronically Signed   By: San Morelle M.D.   On: 06/16/2017 13:58   Ct Angio Neck W Or Wo Contrast  Result Date: 06/16/2017 CLINICAL DATA:  Right-sided weakness.  Weakness prior to seizure. EXAM: CT ANGIOGRAPHY HEAD AND NECK TECHNIQUE: Multidetector CT imaging of the head and neck was performed using the standard protocol during bolus administration of intravenous contrast. Multiplanar CT image  reconstructions and MIPs were obtained to evaluate the vascular anatomy. Carotid stenosis measurements (when applicable) are obtained utilizing NASCET criteria, using the distal internal carotid diameter as the denominator. CONTRAST:  37mL ISOVUE-370 IOPAMIDOL (ISOVUE-370) INJECTION 76% COMPARISON:  CT head without contrast from the same day. FINDINGS: CTA NECK FINDINGS Aortic arch: There is a common origin of the left common carotid artery in the innominate artery. Atherosclerotic changes are noted at the origin the great vessels and in the arch without aneurysm or significant focal stenosis. Right carotid system: Atherosclerotic calcifications are noted along the course of the right common carotid artery without a significant luminal stenosis proximal to the bifurcation. There is a high-grade stenosis at the bifurcation with narrowing of the lumen between calcified plaque to less than 1 mm. This constitutes a greater than 70% stenosis relative to the more distal vessel. No other tandem stenosis is present in the cervical right ICA. There is some tortuosity just below the skull base without focal stenosis. Left carotid system: The left common carotid artery demonstrates distal mural calcification without significant stenosis proximal to the bifurcation. There is dense calcified plaque at the bifurcation which narrows the lumen to approximately 1 mm. The more distal vessel measures 4.5 mm. This constitutes a severe stenosis of greater than 70% relative to the more distal vessel. No tandem stenosis is present in the cervical left ICA. There is tortuosity just proximal skull base. Vertebral arteries: Dense calcifications are present at the origin of the vertebral arteries from the subclavian arteries bilaterally. The left vertebral artery is slightly dominant to the right. No other tandem stenoses are present in the neck. Skeleton: Multilevel degenerative changes are present in the cervical spine. There is chronic  loss of disc height at all levels from C3-4 through C6-7. Slight retrolisthesis is present at C3-4 and C4-5. Uncovertebral spurring contributes to foraminal narrowing greatest at C3-4 and C6-7. No focal lytic or blastic lesions are present. Other neck: The patient is intubated. The endotracheal tube terminates only 2.5 cm above the carina and could be pulled back 1-2 cm for more optimal positioning. NG tube is in a dilated esophagus. Thyroid  is unremarkable. No significant adenopathy is present. Salivary glands are within normal limits. Upper chest: The lung apices demonstrate mild dependent atelectasis. No focal nodule or mass lesion is present. Review of the MIP images confirms the above findings CTA HEAD FINDINGS Anterior circulation: Atherosclerotic calcifications result in moderate stenoses of the cavernous internal carotid arteries bilaterally. The ICA termini are normal. The A1 and M1 segments are normal. The anterior communicating artery is patent. MCA bifurcations are intact. ACA and MCA branch vessels are within normal limits. Posterior circulation: Below the dura. The left PICA originates above the dura. The vertebrobasilar junction is normal. The left posterior cerebral artery is of fetal type. The right posterior cerebral artery originates from the basilar tip. PCA branch vessels are within normal limits bilaterally. Venous sinuses: The dural sinuses are patent. The right transverse sinus is dominant. The straight sinus and deep cerebral veins are intact. Cortical veins are unremarkable. Anatomic variants: Fetal type left posterior cerebral artery. Review of the MIP images confirms the above findings IMPRESSION: 1. No emergent large vessel occlusion. 2. High-grade stenosis at the carotid bifurcations bilaterally. 3. Moderate stenoses within the cavernous internal carotid arteries bilaterally. 4. High-grade stenosis at the origin of the vertebral arteries bilaterally. 5. Fetal type left posterior  cerebral artery. 6. Normal variant circle of Willis without significant proximal stenosis, aneurysm, or branch vessel occlusion above the supraclinoid segments. 7. Multilevel spondylosis of the cervical spine as described. Foraminal disease is greatest at C3-4 and C6-7. 8. The endotracheal tube terminates only 2.5 cm above the carina and could be pulled back 1-2 cm for more optimal positioning. 9.  Aortic Atherosclerosis (ICD10-I70.0). Electronically Signed   By: San Morelle M.D.   On: 06/16/2017 13:58   Dg Chest Portable 1 View  Result Date: 06/16/2017 CLINICAL DATA:  Hypoxia EXAM: PORTABLE CHEST 1 VIEW COMPARISON:  August 26, 2009 FINDINGS: Endotracheal tube tip is 3.7 cm above the carina. Nasogastric tube tip and side port are below the diaphragm. No pneumothorax. There is airspace consolidation in the left lower lobe with small left pleural effusion. There is mild right base atelectasis. Lungs elsewhere clear. Heart is enlarged with pulmonary vascularity within normal limits. No adenopathy. There is aortic atherosclerosis. No evident bone lesions. IMPRESSION: Tube positions as described without pneumothorax. Left base consolidation consistent with pneumonia. Small left pleural effusion. Mild right base atelectasis. Stable cardiomegaly.  Aortic atherosclerosis evident. Aortic Atherosclerosis (ICD10-I70.0). Electronically Signed   By: Lowella Grip III M.D.   On: 06/16/2017 13:18   Ct Head Code Stroke Wo Contrast  Result Date: 06/16/2017 CLINICAL DATA:  Code stroke. Focal neuro deficit. Right-sided weakness EXAM: CT HEAD WITHOUT CONTRAST TECHNIQUE: Contiguous axial images were obtained from the base of the skull through the vertex without intravenous contrast. COMPARISON:  05/08/2017 FINDINGS: Brain: No evidence of acute infarction, hemorrhage, hydrocephalus, extra-axial collection or mass lesion/mass effect. Generalized atrophy that is advanced. Mild chronic microvascular ischemic change in the  periventricular white matter. Vascular: Atherosclerotic calcification. Skull: No acute or aggressive finding. Sinuses/Orbits: Chronic left sphenoid sinusitis when compared to prior. Sequela of right maxillary chronic sinusitis with improved aeration since 2011 brain MRI. Bilateral cataract resection. Left scleral banding. Other: These results were communicated to Dr. Lorraine Lax at 1:25 pmon 3/27/2019by text page via the [facility] messaging system. ASPECTS [facility] Stroke Program Early CT Score) -left hemisphere - Ganglionic level infarction (caudate, lentiform nuclei, internal capsule, insula, M1-M3 cortex): 7 - Supraganglionic infarction (M4-M6 cortex): 3 Total score (0-10 with 10 being normal):  10 IMPRESSION: 1. No acute finding or change from 05/08/2017. 2. Generalized atrophy. 3. Chronic left sphenoid sinusitis. Electronically Signed   By: Monte Fantasia M.D.   On: 06/16/2017 13:26   Velna Ochs, M.D. - PGY2 06/16/2017, 2:49 PM    NEUROHOSPITALIST ADDENDUM Seen and examined the patient today. Reviewed history and exam documented above by Resident. Recommendations as below.     ASSESSMENT: 84 yo M with pmhx of CAD, HFrEF, IDDM, CKD, prior tonic clonic seizure 14 years ago in the setting of alcohol abuse, sober now for 4 years. Presenting after acute onset right sided weakness, dysarthria, and AMS. Had witnessed seizure activity en route, unresponsive on arrival to the ED requiring urgent intubation. CT head negative for bleed, CTA negative for large vessel occlusion. Prior to tPA administration, patient began moving both upper extremities spontaneously with right arm stiffness concerning for recurrent seizure. tPA was  held and ativan was administered. STAT MRI was negative for acute stroke. Patient loaded with Keppra and stat EEG was ordered.    IMPRESSION: Seizure with todd's paralysis. Status Epilepticus CVA ruled out.   PLAN: -- Admit to ICU -- Continuous EEG -- Load 1.5 g IV Keppra now,  then 750 mg BID -- Frequent neuro checks -- Seizure precautions  -- Infectious work up to rule out precipitating cause, UDS -- Will follow    This patient is neurologically critically ill due to status epilepticus. He is at risk for significant risk of neurological worsening from worsening seizures, complications of ICU stay. This patient's care requires constant monitoring of vital signs, hemodynamics, respiratory and cardiac monitoring, review of multiple databases, neurological assessment, discussion with family, other specialists and medical decision making of high complexity.  I spent 50 minutes of neurocritical time in the care of this patient.     Karena Addison Aroor MD [facility] [phone]  If 7pm to 7am, please call on call as listed on AMION.

## 2017-06-16 NOTE — Progress Notes (Signed)
Pt intubated by ED MD, attempted x1 no complications, NARD VSS, Pt placed on doc vent settings, will obtain ABG in 1 hr. Pt transported to CT, then MRI on vent. Will cont to monitor.

## 2017-06-16 NOTE — ED Notes (Signed)
While in CT pt moving all extremities and holding right arm in the air when asked to do so by Dr. Lorraine Lax.

## 2017-06-16 NOTE — Progress Notes (Signed)
STAT LTM started, Dr Aroor aware. RN will move EEG equipment with the patient when he gets a room.

## 2017-06-16 NOTE — ED Notes (Signed)
CCM at bedside 

## 2017-06-16 NOTE — ED Notes (Signed)
PT back from MRI- VSS, Family at bedside

## 2017-06-16 NOTE — H&P (Signed)
PULMONARY / CRITICAL CARE MEDICINE   Name: Daniel [name] MRN: [id number] DOB: Nov 11, 1933    ADMISSION DATE:  06/16/2017  REFERRING MD:  ER  CHIEF COMPLAINT: Seizure with postictal state requiring intubation  HISTORY OF PRESENT ILLNESS:        This is an 84 year old diabetic who had difficulties holding his fork this morning and subsequently developed right-sided weakness and slurred speech.  His wife had taken his blood sugar earlier and it was 112.  EMS was called and he had a generalized seizure in route he has been intubated and is now mechanically ventilated.  He was worked up as a code stroke with a negative head CT and an MRI that does not show any acute infarct there is a small area of hemorrhage near the left vertex.  CTA showed no large vessel occlusion there is bilateral carotid bifurcation stenosis and the vertebrals are stenotic at their origins as well.  He has had one seizure in his life previously at a time where he was apparently unstable and had 2 coronary stents placed.  He has had no further seizure activity since that time.  Wife denies any concurrent recent illness which might have precipitated the seizure activity.  He has had no change in medicines although he is recently been taking an antibiotic for UTI. He has  not had any fevers chills or sweats.  He has not had any chest pain.  He is not known to have never suffered from any arrhythmias, syncopal episodes or palpitations.  PAST MEDICAL HISTORY :  He  has a past medical history of Alcohol abuse, Cancer ([place]), Cardiomyopathy ([place]), CHF (congestive heart failure) ([place]), Complication of anesthesia, Constipation, Coronary artery disease, Diabetes mellitus without complication ([place]), H/O hiatal hernia, Hyperlipidemia, Hypertension, Peripheral neuropathy, Seizures ([place]), and Urinary incontinence.  PAST SURGICAL HISTORY: He  has a past surgical history that includes Radioactive seed implant; Coronary stent placement; Prostate  surgery (2008); Cataract extraction, bilateral; Tonsillectomy; and Coronary angioplasty.  No Known Allergies  No current facility-administered medications on file prior to encounter.    Current Outpatient Medications on File Prior to Encounter  Medication Sig  . aspirin 81 MG tablet Take 81 mg by mouth daily.  . B Complex-C (SUPER B COMPLEX PO) Take 1 tablet by mouth daily.  . citalopram (CELEXA) 10 MG tablet Take 10 mg by mouth daily.   . finasteride (PROSCAR) 5 MG tablet Take 5 mg by mouth daily.   . hydrocortisone 2.5 % cream Apply 1 application topically as needed (face).   . insulin aspart (NOVOLOG FLEXPEN) 100 UNIT/ML FlexPen Inject 10-25 Units into the skin See admin instructions. Inject 10 units subcutaneously every morning and 25 units at night  . Insulin Degludec (TRESIBA FLEXTOUCH) 200 UNIT/ML SOPN Inject 30 Units into the skin at bedtime.  Marland Kitchen latanoprost (XALATAN) 0.005 % ophthalmic solution Place 1 drop into both eyes at bedtime.  Marland Kitchen losartan (COZAAR) 50 MG tablet Take 50 mg by mouth daily.   . Multiple Vitamin (MULTIVITAMIN) tablet Take 1 tablet by mouth daily.  . rosuvastatin (CRESTOR) 20 MG tablet Take 10 mg by mouth daily.   . vitamin C (ASCORBIC ACID) 500 MG tablet Take 500 mg by mouth daily.    FAMILY HISTORY:  His indicated that the status of his mother is unknown. He indicated that the status of his father is unknown. He indicated that the status of his brother is unknown.   SOCIAL HISTORY: He  reports that he has  never smoked. He has never used smokeless tobacco. He reports that he drinks alcohol. He reports that he does not use drugs.  REVIEW OF SYSTEMS:        Review of systems is obtained from the wife.  She tells me that he is very disinclined to report any complaints.  He has never had a prior stroke, he has had one generalized seizure as noted.  He has had 2 coronary stents 15 years ago but no further cardiac complaints.  No chest pain PND no orthopnea.  He  does have a history of prostate cancer which was treated with radioactive seeds and left him with incontinence and limited bladder control.  He was treated for a urinary tract infection beginning 3 weeks ago with 10 days of Ancef.  He is a diabetic as noted. SUBJECTIVE:  As above.  VITAL SIGNS: BP 133/65   Pulse (!) 54   Temp (!) 96 F (35.6 C) (Temporal)   Resp 12   Wt 187 lb 13.3 oz (85.2 kg)   SpO2 98%   BMI 28.56 kg/m   HEMODYNAMICS:    VENTILATOR SETTINGS: Vent Mode: PRVC FiO2 (%):  [60 %] 60 % Set Rate:  [16 bmp] 16 bmp Vt Set:  [500 mL-550 mL] 550 mL PEEP:  [5 cmH20] 5 cmH20  INTAKE / OUTPUT: No intake/output data recorded.  PHYSICAL EXAMINATION: General: This is an elderly male who is intubated mechanically ventilated and in no distress. Neuro: Exam is on 10 mcg of propofol, there is no response to voice, he does not eye open to sternal rub but he does randomly move all fours.  Pupils are equal and reactive and there is a bilateral arcus.  EOMs appear to be full by doll's eyes and the face is symmetric. HEENT: He is orally intubated and has a very thick neck Cardiovascular: S1 and S2 are distant without murmur rub or gallop Lungs: Operations are unlabored, there is symmetric air movement, no wheezes Abdomen: The abdomen is obese and soft without organomegaly masses or tenderness.  He is anicteric. Musculoskeletal: There is trace dependent edema the limbs are pink and warm   LABS:  BMET Recent Labs  Lab 06/16/17 1251 06/16/17 1257  NA 137 139  K 4.6 4.6  CL 108 109  CO2 13*  --   BUN 35* 36*  CREATININE 1.48* 1.40*  GLUCOSE 138* 130*    Electrolytes Recent Labs  Lab 06/16/17 1251  CALCIUM 9.2    CBC Recent Labs  Lab 06/16/17 1251 06/16/17 1257  WBC 17.6*  --   HGB 13.8 13.6  HCT 41.1 40.0  PLT 156  --     Coag's Recent Labs  Lab 06/16/17 1251  APTT 31  INR 1.19    Sepsis Markers No results for input(s): LATICACIDVEN, PROCALCITON,  O2SATVEN in the last 168 hours.  ABG Recent Labs  Lab 06/16/17 1531  PHART 7.252*  PCO2ART 48.2*  PO2ART 317.0*    Liver Enzymes Recent Labs  Lab 06/16/17 1251  AST 34  ALT 21  ALKPHOS 55  BILITOT 0.9  ALBUMIN 3.7    Cardiac Enzymes No results for input(s): TROPONINI, PROBNP in the last 168 hours.  Glucose Recent Labs  Lab 06/16/17 1251  GLUCAP 122*    Imaging Ct Angio Head W Or Wo Contrast  Result Date: 06/16/2017 CLINICAL DATA:  Right-sided weakness.  Weakness prior to seizure. EXAM: CT ANGIOGRAPHY HEAD AND NECK TECHNIQUE: Multidetector CT imaging of the head and neck  was performed using the standard protocol during bolus administration of intravenous contrast. Multiplanar CT image reconstructions and MIPs were obtained to evaluate the vascular anatomy. Carotid stenosis measurements (when applicable) are obtained utilizing NASCET criteria, using the distal internal carotid diameter as the denominator. CONTRAST:  23mL ISOVUE-370 IOPAMIDOL (ISOVUE-370) INJECTION 76% COMPARISON:  CT head without contrast from the same day. FINDINGS: CTA NECK FINDINGS Aortic arch: There is a common origin of the left common carotid artery in the innominate artery. Atherosclerotic changes are noted at the origin the great vessels and in the arch without aneurysm or significant focal stenosis. Right carotid system: Atherosclerotic calcifications are noted along the course of the right common carotid artery without a significant luminal stenosis proximal to the bifurcation. There is a high-grade stenosis at the bifurcation with narrowing of the lumen between calcified plaque to less than 1 mm. This constitutes a greater than 70% stenosis relative to the more distal vessel. No other tandem stenosis is present in the cervical right ICA. There is some tortuosity just below the skull base without focal stenosis. Left carotid system: The left common carotid artery demonstrates distal mural calcification  without significant stenosis proximal to the bifurcation. There is dense calcified plaque at the bifurcation which narrows the lumen to approximately 1 mm. The more distal vessel measures 4.5 mm. This constitutes a severe stenosis of greater than 70% relative to the more distal vessel. No tandem stenosis is present in the cervical left ICA. There is tortuosity just proximal skull base. Vertebral arteries: Dense calcifications are present at the origin of the vertebral arteries from the subclavian arteries bilaterally. The left vertebral artery is slightly dominant to the right. No other tandem stenoses are present in the neck. Skeleton: Multilevel degenerative changes are present in the cervical spine. There is chronic loss of disc height at all levels from C3-4 through C6-7. Slight retrolisthesis is present at C3-4 and C4-5. Uncovertebral spurring contributes to foraminal narrowing greatest at C3-4 and C6-7. No focal lytic or blastic lesions are present. Other neck: The patient is intubated. The endotracheal tube terminates only 2.5 cm above the carina and could be pulled back 1-2 cm for more optimal positioning. NG tube is in a dilated esophagus. Thyroid is unremarkable. No significant adenopathy is present. Salivary glands are within normal limits. Upper chest: The lung apices demonstrate mild dependent atelectasis. No focal nodule or mass lesion is present. Review of the MIP images confirms the above findings CTA HEAD FINDINGS Anterior circulation: Atherosclerotic calcifications result in moderate stenoses of the cavernous internal carotid arteries bilaterally. The ICA termini are normal. The A1 and M1 segments are normal. The anterior communicating artery is patent. MCA bifurcations are intact. ACA and MCA branch vessels are within normal limits. Posterior circulation: Below the dura. The left PICA originates above the dura. The vertebrobasilar junction is normal. The left posterior cerebral artery is of fetal  type. The right posterior cerebral artery originates from the basilar tip. PCA branch vessels are within normal limits bilaterally. Venous sinuses: The dural sinuses are patent. The right transverse sinus is dominant. The straight sinus and deep cerebral veins are intact. Cortical veins are unremarkable. Anatomic variants: Fetal type left posterior cerebral artery. Review of the MIP images confirms the above findings IMPRESSION: 1. No emergent large vessel occlusion. 2. High-grade stenosis at the carotid bifurcations bilaterally. 3. Moderate stenoses within the cavernous internal carotid arteries bilaterally. 4. High-grade stenosis at the origin of the vertebral arteries bilaterally. 5. Fetal type left posterior  cerebral artery. 6. Normal variant circle of Willis without significant proximal stenosis, aneurysm, or branch vessel occlusion above the supraclinoid segments. 7. Multilevel spondylosis of the cervical spine as described. Foraminal disease is greatest at C3-4 and C6-7. 8. The endotracheal tube terminates only 2.5 cm above the carina and could be pulled back 1-2 cm for more optimal positioning. 9.  Aortic Atherosclerosis (ICD10-I70.0). Electronically Signed   By: San Morelle M.D.   On: 06/16/2017 13:58   Ct Angio Neck W Or Wo Contrast  Result Date: 06/16/2017 CLINICAL DATA:  Right-sided weakness.  Weakness prior to seizure. EXAM: CT ANGIOGRAPHY HEAD AND NECK TECHNIQUE: Multidetector CT imaging of the head and neck was performed using the standard protocol during bolus administration of intravenous contrast. Multiplanar CT image reconstructions and MIPs were obtained to evaluate the vascular anatomy. Carotid stenosis measurements (when applicable) are obtained utilizing NASCET criteria, using the distal internal carotid diameter as the denominator. CONTRAST:  9mL ISOVUE-370 IOPAMIDOL (ISOVUE-370) INJECTION 76% COMPARISON:  CT head without contrast from the same day. FINDINGS: CTA NECK FINDINGS  Aortic arch: There is a common origin of the left common carotid artery in the innominate artery. Atherosclerotic changes are noted at the origin the great vessels and in the arch without aneurysm or significant focal stenosis. Right carotid system: Atherosclerotic calcifications are noted along the course of the right common carotid artery without a significant luminal stenosis proximal to the bifurcation. There is a high-grade stenosis at the bifurcation with narrowing of the lumen between calcified plaque to less than 1 mm. This constitutes a greater than 70% stenosis relative to the more distal vessel. No other tandem stenosis is present in the cervical right ICA. There is some tortuosity just below the skull base without focal stenosis. Left carotid system: The left common carotid artery demonstrates distal mural calcification without significant stenosis proximal to the bifurcation. There is dense calcified plaque at the bifurcation which narrows the lumen to approximately 1 mm. The more distal vessel measures 4.5 mm. This constitutes a severe stenosis of greater than 70% relative to the more distal vessel. No tandem stenosis is present in the cervical left ICA. There is tortuosity just proximal skull base. Vertebral arteries: Dense calcifications are present at the origin of the vertebral arteries from the subclavian arteries bilaterally. The left vertebral artery is slightly dominant to the right. No other tandem stenoses are present in the neck. Skeleton: Multilevel degenerative changes are present in the cervical spine. There is chronic loss of disc height at all levels from C3-4 through C6-7. Slight retrolisthesis is present at C3-4 and C4-5. Uncovertebral spurring contributes to foraminal narrowing greatest at C3-4 and C6-7. No focal lytic or blastic lesions are present. Other neck: The patient is intubated. The endotracheal tube terminates only 2.5 cm above the carina and could be pulled back 1-2 cm for  more optimal positioning. NG tube is in a dilated esophagus. Thyroid is unremarkable. No significant adenopathy is present. Salivary glands are within normal limits. Upper chest: The lung apices demonstrate mild dependent atelectasis. No focal nodule or mass lesion is present. Review of the MIP images confirms the above findings CTA HEAD FINDINGS Anterior circulation: Atherosclerotic calcifications result in moderate stenoses of the cavernous internal carotid arteries bilaterally. The ICA termini are normal. The A1 and M1 segments are normal. The anterior communicating artery is patent. MCA bifurcations are intact. ACA and MCA branch vessels are within normal limits. Posterior circulation: Below the dura. The left PICA originates above the  dura. The vertebrobasilar junction is normal. The left posterior cerebral artery is of fetal type. The right posterior cerebral artery originates from the basilar tip. PCA branch vessels are within normal limits bilaterally. Venous sinuses: The dural sinuses are patent. The right transverse sinus is dominant. The straight sinus and deep cerebral veins are intact. Cortical veins are unremarkable. Anatomic variants: Fetal type left posterior cerebral artery. Review of the MIP images confirms the above findings IMPRESSION: 1. No emergent large vessel occlusion. 2. High-grade stenosis at the carotid bifurcations bilaterally. 3. Moderate stenoses within the cavernous internal carotid arteries bilaterally. 4. High-grade stenosis at the origin of the vertebral arteries bilaterally. 5. Fetal type left posterior cerebral artery. 6. Normal variant circle of Willis without significant proximal stenosis, aneurysm, or branch vessel occlusion above the supraclinoid segments. 7. Multilevel spondylosis of the cervical spine as described. Foraminal disease is greatest at C3-4 and C6-7. 8. The endotracheal tube terminates only 2.5 cm above the carina and could be pulled back 1-2 cm for more optimal  positioning. 9.  Aortic Atherosclerosis (ICD10-I70.0). Electronically Signed   By: San Morelle M.D.   On: 06/16/2017 13:58   Mr Brain Wo Contrast  Result Date: 06/16/2017 CLINICAL DATA:  Unresponsive.  Suspected stroke. EXAM: MRI HEAD WITHOUT CONTRAST TECHNIQUE: Multiplanar, multiecho pulse sequences of the brain and surrounding structures were obtained without intravenous contrast. COMPARISON:  CT head earlier today. FINDINGS: Brain: No evidence for acute infarction, hemorrhage, mass lesion, hydrocephalus, or extra-axial fluid. Generalized atrophy. Mild to moderate T2 and FLAIR hyperintensities in the brain, largely confluent, also affecting the pons, consistent with small vessel disease. Vascular: Flow voids are maintained. Tiny focus of chronic hemorrhage near the vertex on the LEFT. Skull and upper cervical spine: Unremarkable visualized calvarium, skullbase, and cervical vertebrae. Pituitary, pineal, cerebellar tonsils unremarkable. No upper cervical cord lesions. Mild cervical spondylosis. Sinuses/Orbits: Significant fluid accumulation in the LEFT division sphenoid sinus. BILATERAL ethmoid sinus mucosal thickening. Previous ocular surgery. LEFT scleral banding. Other: None. IMPRESSION: Atrophy and small vessel disease. No acute intracranial findings. No acute stroke. Electronically Signed   By: Staci Righter M.D.   On: 06/16/2017 14:56   Dg Chest Portable 1 View  Result Date: 06/16/2017 CLINICAL DATA:  Hypoxia EXAM: PORTABLE CHEST 1 VIEW COMPARISON:  August 26, 2009 FINDINGS: Endotracheal tube tip is 3.7 cm above the carina. Nasogastric tube tip and side port are below the diaphragm. No pneumothorax. There is airspace consolidation in the left lower lobe with small left pleural effusion. There is mild right base atelectasis. Lungs elsewhere clear. Heart is enlarged with pulmonary vascularity within normal limits. No adenopathy. There is aortic atherosclerosis. No evident bone lesions. IMPRESSION:  Tube positions as described without pneumothorax. Left base consolidation consistent with pneumonia. Small left pleural effusion. Mild right base atelectasis. Stable cardiomegaly.  Aortic atherosclerosis evident. Aortic Atherosclerosis (ICD10-I70.0). Electronically Signed   By: Lowella Grip III M.D.   On: 06/16/2017 13:18   Ct Head Code Stroke Wo Contrast  Result Date: 06/16/2017 CLINICAL DATA:  Code stroke. Focal neuro deficit. Right-sided weakness EXAM: CT HEAD WITHOUT CONTRAST TECHNIQUE: Contiguous axial images were obtained from the base of the skull through the vertex without intravenous contrast. COMPARISON:  05/08/2017 FINDINGS: Brain: No evidence of acute infarction, hemorrhage, hydrocephalus, extra-axial collection or mass lesion/mass effect. Generalized atrophy that is advanced. Mild chronic microvascular ischemic change in the periventricular white matter. Vascular: Atherosclerotic calcification. Skull: No acute or aggressive finding. Sinuses/Orbits: Chronic left sphenoid sinusitis when compared to prior.  Sequela of right maxillary chronic sinusitis with improved aeration since 2011 brain MRI. Bilateral cataract resection. Left scleral banding. Other: These results were communicated to Dr. Lorraine Lax at 1:25 pmon 3/27/2019by text page via the [facility] messaging system. ASPECTS [facility] Stroke Program Early CT Score) -left hemisphere - Ganglionic level infarction (caudate, lentiform nuclei, internal capsule, insula, M1-M3 cortex): 7 - Supraganglionic infarction (M4-M6 cortex): 3 Total score (0-10 with 10 being normal): 10 IMPRESSION: 1. No acute finding or change from 05/08/2017. 2. Generalized atrophy. 3. Chronic left sphenoid sinusitis. Electronically Signed   By: Monte Fantasia M.D.   On: 06/16/2017 13:26     STUDIES:  EKG shows sinus rhythm with a nonspecific intraventricular conduction delay Chest x-ray shows a well-placed endotracheal tube, there is a hazy left heart border, the NG is well  below the diaphragm  CULTURES: Urine culture has been ordered  ANTIBIOTICS: Zosyn initiated for potential aspiration and urinary tract infection  DISCUSSION:      This is an 84 year old diabetic who is unresponsive status post a generalized seizure.  There is no evidence of large vessel occlusion or stroke by CTA and MRI.  The focus of my work will be on ruling out metabolic provocations for his seizure including concurrent infections or arrhythmias.  ASSESSMENT / PLAN:  PULMONARY A: There is some subtle obscuration of the left heart border and I am going to cover him for potential pneumonia with Zosyn being most suspicious of aspiration.  He does have an expected metabolic acidosis following a seizure and I anticipate that this will resolve without intervention.  At this point he is primarily intubated for airway protection if he shows no more evidence of seizure activity by morning we will wean him off of propofol and proceed rapidly to extubation  CARDIOVASCULAR A:  monitoring rhythm, blood pressure to rule out transient cardiovascular provocations for his seizure.  Serial enzymes have been ordered  GASTROINTESTINAL A: Prophylaxis will be with Protonix  HEMATOLOGIC A: DVT prophylaxis will be with subcu heparin  INFECTIOUS A: Has a substantial leukocytosis which may be solely secondary to his seizure but I want to ensure that he is covered for both urinary tract infection and potential aspiration.    ENDOCRINE A: He is on very long-acting insulin at home and we need to ensure that he is not having hypoglycemic spells which may have provoked his seizure.  I am going to place him on sliding scale insulin alone for now.    NEUROLOGIC A: Postictal state.  He has been placed on Keppra and I am anticipating weaning him off of propofol if he remains seizure-free by morning.    Greater than 35 minutes has been spent in the care of this patient today who is requiring ventilatory  support.   Lars Masson, MD Critical Care Medicine [facility] Pager: [phone]  06/16/2017, 3:42 PM

## 2017-06-16 NOTE — Code Documentation (Addendum)
84 yo male coming from home with wife. Pt had walked to his chair with his wife when she came back to give him his breakfast. Family reported that patient suddenly started to have trouble using his right arm and holding the fork. Wife called EMS. When EMS arrived, they reported right sided weakness and Alert and Oriented x1. During transport, pt was noted to have a seizure that did not last long enough for any medication. Pt became unresponsive with right sided gaze. Code Stroke called by EMS. Stroke Team at the bridge waiting for patient. Patient arrived and was unresponsive. Pt had roving eye movements. Pt was flaccid in all four limbs. EDP intubated patient in Trauma A due to questionable airway maintenance. Taken to CT/CTA. Noted to have movement in bilateral arms after CT. During CT patient was given 20 mg of Labetalol for increased BP and 2 mg of Ativan for agitation. Pt taken for STAT MRI. MRI Negative. Code Stroke cancelled - Not a Stroke. Handoff given to [place], [place]

## 2017-06-16 NOTE — ED Notes (Signed)
Pt taken to CT by Neurology, 3 Rn's and RT

## 2017-06-17 ENCOUNTER — Inpatient Hospital Stay (HOSPITAL_COMMUNITY): Payer: Medicare HMO

## 2017-06-17 ENCOUNTER — Other Ambulatory Visit: Payer: Self-pay

## 2017-06-17 DIAGNOSIS — J9601 Acute respiratory failure with hypoxia: Secondary | ICD-10-CM

## 2017-06-17 DIAGNOSIS — R569 Unspecified convulsions: Secondary | ICD-10-CM | POA: Diagnosis not present

## 2017-06-17 DIAGNOSIS — R4182 Altered mental status, unspecified: Secondary | ICD-10-CM | POA: Diagnosis not present

## 2017-06-17 LAB — COMPREHENSIVE METABOLIC PANEL
ALK PHOS: 43 U/L (ref 38–126)
ALT: 17 U/L (ref 17–63)
AST: 36 U/L (ref 15–41)
Albumin: 3.1 g/dL — ABNORMAL LOW (ref 3.5–5.0)
Anion gap: 11 (ref 5–15)
BUN: 33 mg/dL — AB (ref 6–20)
CALCIUM: 8.5 mg/dL — AB (ref 8.9–10.3)
CHLORIDE: 105 mmol/L (ref 101–111)
CO2: 19 mmol/L — AB (ref 22–32)
CREATININE: 1.51 mg/dL — AB (ref 0.61–1.24)
GFR calc non Af Amer: 42 mL/min — ABNORMAL LOW (ref >60)
GFR, EST AFRICAN AMERICAN: 48 mL/min — AB (ref >60)
Glucose, Bld: 111 mg/dL — ABNORMAL HIGH (ref 65–99)
Potassium: 4.8 mmol/L (ref 3.5–5.1)
SODIUM: 135 mmol/L (ref 135–145)
Total Bilirubin: 1.1 mg/dL (ref 0.3–1.2)
Total Protein: 6 g/dL — ABNORMAL LOW (ref 6.5–8.1)

## 2017-06-17 LAB — POCT I-STAT 3, ART BLOOD GAS (G3+)
ACID-BASE DEFICIT: 4 mmol/L — AB (ref 0.0–2.0)
Bicarbonate: 18.9 mmol/L — ABNORMAL LOW (ref 20.0–28.0)
O2 SAT: 99 %
PCO2 ART: 27.5 mmHg — AB (ref 32.0–48.0)
PO2 ART: 154 mmHg — AB (ref 83.0–108.0)
TCO2: 20 mmol/L — AB (ref 22–32)
pH, Arterial: 7.445 (ref 7.350–7.450)

## 2017-06-17 LAB — RAPID URINE DRUG SCREEN, HOSP PERFORMED
Amphetamines: NOT DETECTED
BARBITURATES: NOT DETECTED
Benzodiazepines: NOT DETECTED
COCAINE: NOT DETECTED
Opiates: NOT DETECTED
TETRAHYDROCANNABINOL: NOT DETECTED

## 2017-06-17 LAB — TROPONIN I
Troponin I: 0.09 ng/mL (ref <0.03)
Troponin I: 0.1 ng/mL (ref <0.03)

## 2017-06-17 LAB — CBC WITH DIFFERENTIAL/PLATELET
BASOS PCT: 0 %
Basophils Absolute: 0 10*3/uL (ref 0.0–0.1)
EOS ABS: 0.1 10*3/uL (ref 0.0–0.7)
EOS PCT: 1 %
HCT: 35.6 % — ABNORMAL LOW (ref 39.0–52.0)
HEMOGLOBIN: 12.1 g/dL — AB (ref 13.0–17.0)
LYMPHS PCT: 23 %
Lymphs Abs: 3 10*3/uL (ref 0.7–4.0)
MCH: 31.5 pg (ref 26.0–34.0)
MCHC: 34 g/dL (ref 30.0–36.0)
MCV: 92.7 fL (ref 78.0–100.0)
Monocytes Absolute: 1.3 10*3/uL — ABNORMAL HIGH (ref 0.1–1.0)
Monocytes Relative: 10 %
NEUTROS PCT: 66 %
Neutro Abs: 8.8 10*3/uL — ABNORMAL HIGH (ref 1.7–7.7)
Platelets: 129 10*3/uL — ABNORMAL LOW (ref 150–400)
RBC: 3.84 MIL/uL — AB (ref 4.22–5.81)
RDW: 12.4 % (ref 11.5–15.5)
WBC: 13.2 10*3/uL — AB (ref 4.0–10.5)

## 2017-06-17 LAB — GLUCOSE, CAPILLARY
Glucose-Capillary: 101 mg/dL — ABNORMAL HIGH (ref 65–99)
Glucose-Capillary: 108 mg/dL — ABNORMAL HIGH (ref 65–99)
Glucose-Capillary: 110 mg/dL — ABNORMAL HIGH (ref 65–99)
Glucose-Capillary: 118 mg/dL — ABNORMAL HIGH (ref 65–99)
Glucose-Capillary: 141 mg/dL — ABNORMAL HIGH (ref 65–99)
Glucose-Capillary: 95 mg/dL (ref 65–99)

## 2017-06-17 LAB — URINALYSIS, ROUTINE W REFLEX MICROSCOPIC
Bacteria, UA: NONE SEEN
Bilirubin Urine: NEGATIVE
GLUCOSE, UA: NEGATIVE mg/dL
HGB URINE DIPSTICK: NEGATIVE
KETONES UR: NEGATIVE mg/dL
LEUKOCYTES UA: NEGATIVE
NITRITE: NEGATIVE
PH: 6 (ref 5.0–8.0)
PROTEIN: 100 mg/dL — AB
Specific Gravity, Urine: 1.027 (ref 1.005–1.030)

## 2017-06-17 LAB — MAGNESIUM: Magnesium: 1.9 mg/dL (ref 1.7–2.4)

## 2017-06-17 LAB — PATHOLOGIST SMEAR REVIEW

## 2017-06-17 LAB — PROCALCITONIN

## 2017-06-17 MED ORDER — ORAL CARE MOUTH RINSE: 15.0000 mL | Freq: Two times a day (BID) | OROMUCOSAL | Status: DC

## 2017-06-17 MED ORDER — CHLORHEXIDINE GLUCONATE 0.12 % MT SOLN: 15.0000 mL | Freq: Two times a day (BID) | OROMUCOSAL | Status: DC

## 2017-06-17 MED ORDER — CHLORHEXIDINE GLUCONATE 0.12 % MT SOLN
15.0000 mL | Freq: Two times a day (BID) | OROMUCOSAL | Status: DC
  Administered 2017-06-17 – 2017-06-23 (×11): 15 mL via OROMUCOSAL
  Filled 2017-06-17 (×10): qty 15

## 2017-06-17 MED ORDER — PANTOPRAZOLE SODIUM 40 MG PO PACK
40.0000 mg | PACK | Freq: Every day | ORAL | Status: DC
Start: 2017-06-17 — End: 2017-06-17

## 2017-06-17 MED ORDER — ASPIRIN 81 MG PO CHEW
81.0000 mg | CHEWABLE_TABLET | Freq: Every day | ORAL | Status: DC
  Administered 2017-06-17 – 2017-06-23 (×7): 81 mg via ORAL
  Filled 2017-06-17 (×7): qty 1

## 2017-06-17 MED ORDER — PIPERACILLIN-TAZOBACTAM 3.375 G IVPB
3.3750 g | Freq: Three times a day (TID) | INTRAVENOUS | Status: DC
Start: 2017-06-17 — End: 2017-06-18
  Administered 2017-06-17 – 2017-06-18 (×2): 3.375 g via INTRAVENOUS
  Filled 2017-06-17 (×3): qty 50

## 2017-06-17 MED ORDER — LEVETIRACETAM 100 MG/ML PO SOLN
750.0000 mg | Freq: Two times a day (BID) | ORAL | Status: DC
  Administered 2017-06-17 – 2017-06-23 (×12): 750 mg via ORAL
  Filled 2017-06-17 (×13): qty 7.5

## 2017-06-17 MED ORDER — MAGNESIUM SULFATE IN D5W 1-5 GM/100ML-% IV SOLN
1.0000 g | Freq: Once | INTRAVENOUS | Status: AC
  Administered 2017-06-17: 1 g via INTRAVENOUS
  Filled 2017-06-17: qty 100

## 2017-06-17 MED ORDER — ORAL CARE MOUTH RINSE
15.0000 mL | Freq: Two times a day (BID) | OROMUCOSAL | Status: DC
  Administered 2017-06-18 – 2017-06-23 (×6): 15 mL via OROMUCOSAL

## 2017-06-17 MED ORDER — LEVETIRACETAM 100 MG/ML PO SOLN
750.0000 mg | Freq: Two times a day (BID) | ORAL | Status: DC
  Filled 2017-06-17: qty 7.5

## 2017-06-17 NOTE — Progress Notes (Addendum)
Reason for consult: Seizure  Subjective: Intubated on pressure support. Alert, following commands. Off sedation.   ROS: Unable to obtain due to intubation  Examination  Vital signs in last 24 hours: Temp:  [96 F (35.6 C)-98.8 F (37.1 C)] 98.8 F (37.1 C) (03/28 0717) Pulse Rate:  [54-108] 68 (03/28 0800) Resp:  [11-22] 18 (03/28 0800) BP: (96-188)/(43-98) 138/61 (03/28 0800) SpO2:  [98 %-100 %] 100 % (03/28 0800) FiO2 (%):  [40 %-60 %] 40 % (03/28 0320) Weight:  [187 lb 13.3 oz (85.2 kg)-189 lb 6 oz (85.9 kg)] 189 lb 6 oz (85.9 kg) (03/28 0432)  General: Not in distress, cooperative CVS: pulse-normal rate and rhythm RS: On vent Extremities: normal   Neuro: MS: Alert, following commands CN: pupils equal and reactive,  EOMI, face symmetric Motor: Moving all extremities spontaneously against gravity, no deficits  Reflexes: diminished throughout  Coordination: not tested Gait: not tested  Basic Metabolic Panel: Recent Labs  Lab 06/16/17 1251 06/16/17 1257 06/17/17 0359  NA 137 139 135  K 4.6 4.6 4.8  CL 108 109 105  CO2 13*  --  19*  GLUCOSE 138* 130* 111*  BUN 35* 36* 33*  CREATININE 1.48* 1.40* 1.51*  CALCIUM 9.2  --  8.5*  MG  --   --  1.9    CBC: Recent Labs  Lab 06/16/17 1251 06/16/17 1257 06/17/17 0359  WBC 17.6*  --  13.2*  NEUTROABS 5.3  --  8.8*  HGB 13.8 13.6 12.1*  HCT 41.1 40.0 35.6*  MCV 96.7  --  92.7  PLT 156  --  129*     Coagulation Studies: Recent Labs    06/16/17 1251  LABPROT 15.0  INR 1.19    Imaging Reviewed:   3/27 CT Head Code Stroke:  IMPRESSION: 1. No acute finding or change from 05/08/2017. 2. Generalized atrophy. 3. Chronic left sphenoid sinusitis.  3/27 CTA Head & Neck: IMPRESSION: 1. No emergent large vessel occlusion. 2. High-grade stenosis at the carotid bifurcations bilaterally. 3. Moderate stenoses within the cavernous internal carotid arteries bilaterally. 4. High-grade stenosis at the origin of  the vertebral arteries bilaterally. 5. Fetal type left posterior cerebral artery. 6. Normal variant circle of Willis without significant proximal stenosis, aneurysm, or branch vessel occlusion above the supraclinoid segments. 7. Multilevel spondylosis of the cervical spine as described. Foraminal disease is greatest at C3-4 and C6-7. 8. The endotracheal tube terminates only 2.5 cm above the carina and could be pulled back 1-2 cm for more optimal positioning. 9.  Aortic Atherosclerosis (ICD10-I70.0).  3/27 MRI Brain: IMPRESSION: Atrophy and small vessel disease. No acute intracranial findings. No acute stroke.  Impression:  This is an abnormal EEG due to the presence of the following: 1) Generalized polymorphic delta and theta slowing, suggesting moderate to severe encephalopathy, but medication effect (propofol) could not be excluded; 2) Subtle LPDs in the left posterior temporoparietal region, suggesting focal cortical irritability.  No definite epileptiform discharges or seizures were in evidence.     ASSESSMENT: 84 yo M with pmhx of CAD, HFrEF, IDDM, CKD, prior tonic clonic seizure 15 years ago in the setting of alcohol abuse, sober now for 4 years. Presenting after acute onset right sided weakness, dysarthria, and AMS. Had witnessed seizure activity en route, unresponsive on arrival to the ED requiring urgent intubation. CT head negative for bleed, CTA negative for large vessel occlusion. Prior to tPA administration, patient began moving both upper extremities spontaneously with right arm stiffness concerning for  recurrent seizure. tPA was  held and ativan was administered. STAT MRI was negative for acute stroke. Patient loaded with Keppra. EEG negative for ongoing seizures.   IMPRESSION: Seizure with todd's paralysis - resolved  Post Ictal state - resolved   PLAN: -- Continue IV Keppra 750 mg BID -- D/C EEG, shows subtle periodic discharges in left temporoparietal region  indicating risk for seizures but no ongoing epileptic activity was seen -- Frequent neuro checks -- Seizure precautions -- Continue Zosyn for PNA coverage per primary  -- Appreciate PCCM assistance   Per [facility] statutes, patients with seizures are not allowed to drive until they have been seizure-free for six months.   Use caution when using heavy equipment or power tools. Avoid working on ladders or at heights. Take showers instead of baths. Ensure the water temperature is not too high on the home water heater. Do not go swimming alone. Do not lock yourself in a room alone (i.e. bathroom). When caring for infants or small children, sit down when holding, feeding, or changing them to minimize risk of injury to the child in the event you have a seizure. Maintain good sleep hygiene. Avoid alcohol.    If patient has another seizure, call 911 and bring them back to the ED if: A.  The seizure lasts longer than 5 minutes.      B.  The patient doesn't wake shortly after the seizure or has new problems such as difficulty seeing, speaking or moving following the seizure C.  The patient was injured during the seizure D.  The patient has a temperature over 102 F (39C) E.  The patient vomited during the seizure and now is having trouble breathing   Velna Ochs, M.D. - PGY2 06/17/2017, 8:55 AM   NEUROHOSPITALIST ADDENDUM Seen and examined the patient today. I reviewed history,exam and I agree with  documented above by PAC/Resident with few changes made as above.   We will sign off.   Karena Addison Aroor MD [facility] [phone]  If 7pm to 7am, please call on call as listed on AMION.

## 2017-06-17 NOTE — Evaluation (Signed)
Physical Therapy Evaluation Patient Details Name: Daniel [name] MRN: [id number] DOB: Aug 19, 1933 Today's Date: 06/17/2017   History of Present Illness  This is an 84 year old male presenting with altered mental status, seizure, right-sided weakness and slurred speech.  Video-EEG monitoring was performed to evaluate for seizures.pmhx of CAD, HFrEF, IDDM, CKD, tonic clonic seizure 15 years ago, and prior alcohol abuse (sober 4 years).   Clinical Impression  Pt admitted with above diagnosis. Pt currently with functional limitations due to the deficits listed below (see PT Problem List). Pt was unable to get OOB to chair due to lethargy and weakness.  Attempts made but max assist of 2 persons and pt could not pivot.  Will follow acutely.  Pt will benefit from skilled PT to increase their independence and safety with mobility to allow discharge to the venue listed below.      Follow Up Recommendations SNF;Supervision/Assistance - 24 hour    Equipment Recommendations  None recommended by PT    Recommendations for Other Services       Precautions / Restrictions Precautions Precautions: Fall Restrictions Weight Bearing Restrictions: No      Mobility  Bed Mobility Overal bed mobility: Needs Assistance Bed Mobility: Supine to Sit     Supine to sit: Max assist;+2 for physical assistance     General bed mobility comments: Needed a lot of assist to come to EOB with assist with LEs and elevation of trunk  Transfers Overall transfer level: Needs assistance Equipment used: 2 person hand held assist Transfers: Sit to/from Stand Sit to Stand: Max assist;Total assist;+2 physical assistance;From elevated surface         General transfer comment: Tried to stand 3 x with first attempt, pt not clearing buttocks.  Second two attempts pt made it partially standing but posterior lean and extension of feet with weight on heels of feet and could not pivot pt due to poor postural control and  stability.   Ambulation/Gait                Stairs            Wheelchair Mobility    Modified Rankin (Stroke Patients Only)       Balance Overall balance assessment: Needs assistance Sitting-balance support: Bilateral upper extremity supported;Feet supported Sitting balance-Leahy Scale: Poor Sitting balance - Comments: Needed UE support Postural control: Posterior lean Standing balance support: Bilateral upper extremity supported;During functional activity Standing balance-Leahy Scale: Poor Standing balance comment: Needed bil UE support for balance in standing. could not achieve full upright standing with pt leaning posteriorly with extensor tone in LES causing pt to lose balance posteriorly.                              Pertinent Vitals/Pain Pain Assessment: No/denies pain   VSS on [place] expects to be discharged to:: Private residence Living Arrangements: Spouse/significant other Available Help at Discharge: Family;Available 24 hours/day Type of Home: House Home Access: Stairs to enter   [employer] of Steps: 1 Home Layout: Two level Home Equipment: Clinical cytogeneticist - 2 wheels;Wheelchair - manual      Prior Function Level of Independence: Needs assistance   Gait / Transfers Assistance Needed: USed RW inside home and wheelchair outside  ADL's / Homemaking Assistance Needed: wife assists pt in shower        Hand Dominance        Extremity/Trunk Assessment   Upper  Extremity Assessment Upper Extremity Assessment: Defer to OT evaluation    Lower Extremity Assessment Lower Extremity Assessment: Generalized weakness    Cervical / Trunk Assessment Cervical / Trunk Assessment: Kyphotic  Communication   Communication: No difficulties  Cognition Arousal/Alertness: Lethargic Behavior During Therapy: WFL for tasks assessed/performed Overall Cognitive Status: Impaired/Different from baseline Area  of Impairment: Awareness;Problem solving                             Problem Solving: Slow processing;Decreased initiation;Difficulty sequencing;Requires verbal cues;Requires tactile cues General Comments: Pt slightly lethargic, ? hallucinating      General Comments General comments (skin integrity, edema, etc.): Had to clean pt as noted he had a BM once pt was laid back onto bed.  Did not get OOB due to pt could not stand safely to transfer    Exercises     Assessment/Plan    PT Assessment Patient needs continued PT services  PT Problem List Decreased strength;Decreased activity tolerance;Decreased balance;Decreased mobility;Decreased knowledge of use of DME;Decreased safety awareness;Decreased knowledge of precautions;Decreased cognition;Cardiopulmonary status limiting activity       PT Treatment Interventions DME instruction;Gait training;Functional mobility training;Therapeutic activities;Therapeutic exercise;Balance training;Patient/family education;Cognitive remediation    PT Goals (Current goals can be found in the Care Plan section)  Acute Rehab PT Goals Patient Stated Goal: to get better PT Goal Formulation: With patient Time For Goal Achievement: 07/01/17 Potential to Achieve Goals: Fair    Frequency Min 3X/week   Barriers to discharge        Co-evaluation               AM-PAC PT "6 Clicks" Daily Activity  Outcome Measure Difficulty turning over in bed (including adjusting bedclothes, sheets and blankets)?: Unable Difficulty moving from lying on back to sitting on the side of the bed? : Unable Difficulty sitting down on and standing up from a chair with arms (e.g., wheelchair, bedside commode, etc,.)?: Unable Help needed moving to and from a bed to chair (including a wheelchair)?: Total Help needed walking in hospital room?: Total Help needed climbing 3-5 steps with a railing? : Total 6 Click Score: 6    End of Session Equipment Utilized  During Treatment: Gait belt;Oxygen Activity Tolerance: Patient limited by fatigue Patient left: with call bell/phone within reach;in bed;with bed alarm set;with SCD's reapplied Nurse Communication: Mobility status;Need for lift equipment PT Visit Diagnosis: Unsteadiness on feet (R26.81);Muscle weakness (generalized) (M62.81)    Time: 8032-1224 PT Time Calculation (min) (ACUTE ONLY): 33 min   Charges:   PT Evaluation $PT Eval Moderate Complexity: 1 Mod PT Treatments $Therapeutic Activity: 8-22 mins   PT G Codes:        Navdeep Halt,PT Acute Rehabilitation [phone] (pager)   Denice Paradise 06/17/2017, 2:03 PM

## 2017-06-17 NOTE — Progress Notes (Signed)
Bedside nurse swallow study done per Dr. Chase Caller.  Patient given ice chips and sips of water.  No s/s of distress or pain.  Patient stated he had, "no trouble" with swallowing.  Patient will be put on clear liquid diet, per verbal order to advance diet as tolerated.

## 2017-06-17 NOTE — Procedures (Signed)
Extubation Procedure Note  Patient Details:   Name: Daniel [name] DOB: 02-Aug-1933 MRN: [id number]   Airway Documentation:     Evaluation  O2 sats: stable throughout Complications: No apparent complications Patient did tolerate procedure well. Bilateral Breath Sounds: Clear   Yes pt able to vocalize.   Pt extubated per MD order. Pt able to breathe around deflated cuff. No Stridor noted. Pt slightly confused and unable to perform IS. Pt has adequate cough. Placed on 4L [place] and tolerating well at this time.    Irineo Axon [facility] 06/17/2017, 10:02 AM

## 2017-06-17 NOTE — Progress Notes (Signed)
Doing well post exdtubation Tx out to med surg Neuro signed off Case handed over to [facility] Dr Sherral Hammers for pick up 06/18/17  CCM will be off from 7am 06/18/17    Dr. Brand Males, M.D., [facility].C.P Pulmonary and Critical Care Medicine Staff Physician, [place] Director - Interstitial Lung Disease  Program  Pulmonary [place] at [place], [place], [id number]  Pager: [phone], If no answer or between  15:00h - 7:00h: call [phone] Telephone: [phone]

## 2017-06-17 NOTE — Procedures (Signed)
Continuous Video-EEG Monitoring Report   Name:   Daniel [name], Daniel [name] MRN:    [id number]  Study Duration: 06/16/2017 16:14 to 06/17/2017 07:30 CPT Code:  61537 Diagnosis:  Altered mental status (R41.82); Seizures (R56.9)  History: This is an 84 year old male presenting with altered mental status, seizure, right-sided weakness and slurred speech.  Video-EEG monitoring was performed to evaluate for seizures.  Technical Details:  Long-term video-EEG monitoring was performed using standard setting per the guidelines.  Briefly, a minimum of 21 electrodes were placed on scalp according to the International 10-20 or/and 10-10 Systems.  Supplemental electrodes were placed as needed.  Single EKG electrode was also used to detect cardiac arrhythmia.  Patient's behavior was continuously recorded on video simultaneously with EEG.  A minimum of 16 channels were used for data display.  Each epoch of study was reviewed manually daily and as needed using standard referential and bipolar montages.    EEG Description:  There was generalized polymorphic delta and theta slowing with attenuated amplitude.  Diffuse beta activity was present, most likely due to medication effect (propofol).  No discernible posterior dominant rhythm or sleep architecture was recorded.  Persistent low-amplitude Lateralized periodic discharges (LPDs) were present in the left posterior temporoparietal region, with a frequency of about 0.5-0.7 Hz.  No definite epileptiform discharges or seizures were in evidence.  Impression:  This is an abnormal EEG due to the presence of the following: 1) Generalized polymorphic delta and theta slowing, suggesting moderate to severe encephalopathy, but medication effect (propofol) could not be excluded; 2) Subtle LPDs in the left posterior temporoparietal region, suggesting focal cortical irritability.  No definite epileptiform discharges or seizures were in evidence.     Reading Physician: Winfield Cunas, MD, PhD

## 2017-06-17 NOTE — Progress Notes (Signed)
PULMONARY / CRITICAL CARE MEDICINE   Name: Daniel [name] MRN: [id number] DOB: Apr 28, 1933    ADMISSION DATE:  06/16/2017  REFERRING MD:  ER  CHIEF COMPLAINT: Seizure with postictal state requiring intubation  brief      This is an 84 year old diabetic who had difficulties holding his fork this morning and subsequently developed right-sided weakness and slurred speech.  His wife had taken his blood sugar earlier and it was 112.  EMS was called and he had a generalized seizure in route he has been intubated and is now mechanically ventilated.  He was worked up as a code stroke with a negative head CT and an MRI that does not show any acute infarct there is a small area of hemorrhage near the left vertex.  CTA showed no large vessel occlusion there is bilateral carotid bifurcation stenosis and the vertebrals are stenotic at their origins as well.  He has had one seizure in his life previously at a time where he was apparently unstable and had 2 coronary stents placed.  He has had no further seizure activity since that time.  Wife denies any concurrent recent illness which might have precipitated the seizure activity.  He has had no change in medicines although he is recently been taking an antibiotic for UTI. He has  not had any fevers chills or sweats.  He has not had any chest pain.  He is not known to have never suffered from any arrhythmias, syncopal episodes or palpitations.   STUDIES:  EKG shows sinus rhythm with a nonspecific intraventricular conduction delay Chest x-ray shows a well-placed endotracheal tube, there is a hazy left heart border, the NG is well below the diaphragm  CULTURES: Urine culture has been ordered  ANTIBIOTICS: Zosyn initiated for potential aspiration and urinary tract infection   EVENTS  06/16/2017 - admit   SUBJECTIVE/OVERNIGHT/INTERVAL HX 06/17/2017 - off diprivan, awake following commands. Originally DNR. Per RN meets extubation criteira. Doing SBT right  now. Prior ETOH - last 4 years ago per RN, No seizure since then till now  BAseline creat 1.4mg  2015 through feb 2019  VITAL SIGNS: BP 138/61   Pulse 79   Temp 98.8 F (37.1 C) (Oral)   Resp 15   Wt 85.9 kg (189 lb 6 oz)   SpO2 100%   BMI 28.79 kg/m   HEMODYNAMICS:    VENTILATOR SETTINGS: Vent Mode: CPAP;PSV FiO2 (%):  [40 %-60 %] 40 % Set Rate:  [16 bmp-18 bmp] 18 bmp Vt Set:  [500 mL-550 mL] 550 mL PEEP:  [5 cmH20] 5 cmH20 Pressure Support:  [8 cmH20] 8 cmH20 Plateau Pressure:  [14 cmH20-18 cmH20] 18 cmH20  INTAKE / OUTPUT: I/O last 3 completed shifts: In: 3148.8 [I.V.:2801.3; NG/GT:90; IV Piggyback:257.5] Out: 10 [Urine:710; Emesis/NG output:50]  PHYSICAL EXAMINATION:  General Appearance:    Looks chronically ill  Head:    Normocephalic, without obvious abnormality, atraumatic  Eyes:    PERRL - yes, conjunctiva/corneas - clear      Ears:    Normal external ear canals, both ears  Nose:   NG tube - no  Throat:  ETT TUBE - yes , OG tube - yes  Neck:   Supple,  No enlargement/tenderness/nodules     Lungs:     Clear to auscultation bilaterally, Ventilator   Synchrony - yes  Chest wall:    No deformity  Heart:    S1 and S2 normal, no murmur, CVP - no.  Pressors - no  Abdomen:     Soft, no masses, no organomegaly  Genitalia:    Not done  Rectal:   not done  Extremities:   Extremities- intact     Skin:   Intact in exposed areas . Sacral area - no decub reported by RN     Neurologic:   Sedation - none -> RASS - 0 . Moves all 4s - yes. CAM-ICU - unclear . Orientation - follows simple commands, good head strength    PULMONARY Recent Labs  Lab 06/16/17 1257 06/16/17 1531 06/17/17 0415  PHART  --  7.252* 7.445  PCO2ART  --  48.2* 27.5*  PO2ART  --  317.0* 154.0*  HCO3  --  21.3 18.9*  TCO2 16* 23 20*  O2SAT  --  100.0 99.0    CBC Recent Labs  Lab 06/16/17 1251 06/16/17 1257 06/17/17 0359  HGB 13.8 13.6 12.1*  HCT 41.1 40.0 35.6*  WBC 17.6*  --   13.2*  PLT 156  --  129*    COAGULATION Recent Labs  Lab 06/16/17 1251  INR 1.19    CARDIAC   Recent Labs  Lab 06/16/17 1702 06/16/17 2328 06/17/17 0359  TROPONINI 0.06* 0.09* 0.10*   No results for input(s): PROBNP in the last 168 hours.   CHEMISTRY Recent Labs  Lab 06/16/17 1251 06/16/17 1257 06/17/17 0359  NA 137 139 135  K 4.6 4.6 4.8  CL 108 109 105  CO2 13*  --  19*  GLUCOSE 138* 130* 111*  BUN 35* 36* 33*  CREATININE 1.48* 1.40* 1.51*  CALCIUM 9.2  --  8.5*  MG  --   --  1.9   Estimated Creatinine Clearance: 40.9 mL/min (A) (by C-G formula based on SCr of 1.51 mg/dL (H)).   LIVER Recent Labs  Lab 06/16/17 1251 06/17/17 0359  AST 34 36  ALT 21 17  ALKPHOS 55 43  BILITOT 0.9 1.1  PROT 7.1 6.0*  ALBUMIN 3.7 3.1*  INR 1.19  --      INFECTIOUS No results for input(s): LATICACIDVEN, PROCALCITON in the last 168 hours.   ENDOCRINE CBG (last 3)  Recent Labs    06/16/17 2303 06/17/17 0346 06/17/17 0713  GLUCAP 106* 101* 110*         IMAGING x48h  - image(s) personally visualized  -   highlighted in bold Ct Angio Head W Or Wo Contrast  Result Date: 06/16/2017 CLINICAL DATA:  Right-sided weakness.  Weakness prior to seizure. EXAM: CT ANGIOGRAPHY HEAD AND NECK TECHNIQUE: Multidetector CT imaging of the head and neck was performed using the standard protocol during bolus administration of intravenous contrast. Multiplanar CT image reconstructions and MIPs were obtained to evaluate the vascular anatomy. Carotid stenosis measurements (when applicable) are obtained utilizing NASCET criteria, using the distal internal carotid diameter as the denominator. CONTRAST:  67mL ISOVUE-370 IOPAMIDOL (ISOVUE-370) INJECTION 76% COMPARISON:  CT head without contrast from the same day. FINDINGS: CTA NECK FINDINGS Aortic arch: There is a common origin of the left common carotid artery in the innominate artery. Atherosclerotic changes are noted at the origin the  great vessels and in the arch without aneurysm or significant focal stenosis. Right carotid system: Atherosclerotic calcifications are noted along the course of the right common carotid artery without a significant luminal stenosis proximal to the bifurcation. There is a high-grade stenosis at the bifurcation with narrowing of the lumen between calcified plaque to less than 1 mm. This constitutes a greater than 70% stenosis relative  to the more distal vessel. No other tandem stenosis is present in the cervical right ICA. There is some tortuosity just below the skull base without focal stenosis. Left carotid system: The left common carotid artery demonstrates distal mural calcification without significant stenosis proximal to the bifurcation. There is dense calcified plaque at the bifurcation which narrows the lumen to approximately 1 mm. The more distal vessel measures 4.5 mm. This constitutes a severe stenosis of greater than 70% relative to the more distal vessel. No tandem stenosis is present in the cervical left ICA. There is tortuosity just proximal skull base. Vertebral arteries: Dense calcifications are present at the origin of the vertebral arteries from the subclavian arteries bilaterally. The left vertebral artery is slightly dominant to the right. No other tandem stenoses are present in the neck. Skeleton: Multilevel degenerative changes are present in the cervical spine. There is chronic loss of disc height at all levels from C3-4 through C6-7. Slight retrolisthesis is present at C3-4 and C4-5. Uncovertebral spurring contributes to foraminal narrowing greatest at C3-4 and C6-7. No focal lytic or blastic lesions are present. Other neck: The patient is intubated. The endotracheal tube terminates only 2.5 cm above the carina and could be pulled back 1-2 cm for more optimal positioning. NG tube is in a dilated esophagus. Thyroid is unremarkable. No significant adenopathy is present. Salivary glands are  within normal limits. Upper chest: The lung apices demonstrate mild dependent atelectasis. No focal nodule or mass lesion is present. Review of the MIP images confirms the above findings CTA HEAD FINDINGS Anterior circulation: Atherosclerotic calcifications result in moderate stenoses of the cavernous internal carotid arteries bilaterally. The ICA termini are normal. The A1 and M1 segments are normal. The anterior communicating artery is patent. MCA bifurcations are intact. ACA and MCA branch vessels are within normal limits. Posterior circulation: Below the dura. The left PICA originates above the dura. The vertebrobasilar junction is normal. The left posterior cerebral artery is of fetal type. The right posterior cerebral artery originates from the basilar tip. PCA branch vessels are within normal limits bilaterally. Venous sinuses: The dural sinuses are patent. The right transverse sinus is dominant. The straight sinus and deep cerebral veins are intact. Cortical veins are unremarkable. Anatomic variants: Fetal type left posterior cerebral artery. Review of the MIP images confirms the above findings IMPRESSION: 1. No emergent large vessel occlusion. 2. High-grade stenosis at the carotid bifurcations bilaterally. 3. Moderate stenoses within the cavernous internal carotid arteries bilaterally. 4. High-grade stenosis at the origin of the vertebral arteries bilaterally. 5. Fetal type left posterior cerebral artery. 6. Normal variant circle of Willis without significant proximal stenosis, aneurysm, or branch vessel occlusion above the supraclinoid segments. 7. Multilevel spondylosis of the cervical spine as described. Foraminal disease is greatest at C3-4 and C6-7. 8. The endotracheal tube terminates only 2.5 cm above the carina and could be pulled back 1-2 cm for more optimal positioning. 9.  Aortic Atherosclerosis (ICD10-I70.0). Electronically Signed   By: San Morelle M.D.   On: 06/16/2017 13:58   Ct  Angio Neck W Or Wo Contrast  Result Date: 06/16/2017 CLINICAL DATA:  Right-sided weakness.  Weakness prior to seizure. EXAM: CT ANGIOGRAPHY HEAD AND NECK TECHNIQUE: Multidetector CT imaging of the head and neck was performed using the standard protocol during bolus administration of intravenous contrast. Multiplanar CT image reconstructions and MIPs were obtained to evaluate the vascular anatomy. Carotid stenosis measurements (when applicable) are obtained utilizing NASCET criteria, using the distal internal carotid  diameter as the denominator. CONTRAST:  72mL ISOVUE-370 IOPAMIDOL (ISOVUE-370) INJECTION 76% COMPARISON:  CT head without contrast from the same day. FINDINGS: CTA NECK FINDINGS Aortic arch: There is a common origin of the left common carotid artery in the innominate artery. Atherosclerotic changes are noted at the origin the great vessels and in the arch without aneurysm or significant focal stenosis. Right carotid system: Atherosclerotic calcifications are noted along the course of the right common carotid artery without a significant luminal stenosis proximal to the bifurcation. There is a high-grade stenosis at the bifurcation with narrowing of the lumen between calcified plaque to less than 1 mm. This constitutes a greater than 70% stenosis relative to the more distal vessel. No other tandem stenosis is present in the cervical right ICA. There is some tortuosity just below the skull base without focal stenosis. Left carotid system: The left common carotid artery demonstrates distal mural calcification without significant stenosis proximal to the bifurcation. There is dense calcified plaque at the bifurcation which narrows the lumen to approximately 1 mm. The more distal vessel measures 4.5 mm. This constitutes a severe stenosis of greater than 70% relative to the more distal vessel. No tandem stenosis is present in the cervical left ICA. There is tortuosity just proximal skull base. Vertebral  arteries: Dense calcifications are present at the origin of the vertebral arteries from the subclavian arteries bilaterally. The left vertebral artery is slightly dominant to the right. No other tandem stenoses are present in the neck. Skeleton: Multilevel degenerative changes are present in the cervical spine. There is chronic loss of disc height at all levels from C3-4 through C6-7. Slight retrolisthesis is present at C3-4 and C4-5. Uncovertebral spurring contributes to foraminal narrowing greatest at C3-4 and C6-7. No focal lytic or blastic lesions are present. Other neck: The patient is intubated. The endotracheal tube terminates only 2.5 cm above the carina and could be pulled back 1-2 cm for more optimal positioning. NG tube is in a dilated esophagus. Thyroid is unremarkable. No significant adenopathy is present. Salivary glands are within normal limits. Upper chest: The lung apices demonstrate mild dependent atelectasis. No focal nodule or mass lesion is present. Review of the MIP images confirms the above findings CTA HEAD FINDINGS Anterior circulation: Atherosclerotic calcifications result in moderate stenoses of the cavernous internal carotid arteries bilaterally. The ICA termini are normal. The A1 and M1 segments are normal. The anterior communicating artery is patent. MCA bifurcations are intact. ACA and MCA branch vessels are within normal limits. Posterior circulation: Below the dura. The left PICA originates above the dura. The vertebrobasilar junction is normal. The left posterior cerebral artery is of fetal type. The right posterior cerebral artery originates from the basilar tip. PCA branch vessels are within normal limits bilaterally. Venous sinuses: The dural sinuses are patent. The right transverse sinus is dominant. The straight sinus and deep cerebral veins are intact. Cortical veins are unremarkable. Anatomic variants: Fetal type left posterior cerebral artery. Review of the MIP images  confirms the above findings IMPRESSION: 1. No emergent large vessel occlusion. 2. High-grade stenosis at the carotid bifurcations bilaterally. 3. Moderate stenoses within the cavernous internal carotid arteries bilaterally. 4. High-grade stenosis at the origin of the vertebral arteries bilaterally. 5. Fetal type left posterior cerebral artery. 6. Normal variant circle of Willis without significant proximal stenosis, aneurysm, or branch vessel occlusion above the supraclinoid segments. 7. Multilevel spondylosis of the cervical spine as described. Foraminal disease is greatest at C3-4 and C6-7. 8. The  endotracheal tube terminates only 2.5 cm above the carina and could be pulled back 1-2 cm for more optimal positioning. 9.  Aortic Atherosclerosis (ICD10-I70.0). Electronically Signed   By: San Morelle M.D.   On: 06/16/2017 13:58   Mr Brain Wo Contrast  Result Date: 06/16/2017 CLINICAL DATA:  Unresponsive.  Suspected stroke. EXAM: MRI HEAD WITHOUT CONTRAST TECHNIQUE: Multiplanar, multiecho pulse sequences of the brain and surrounding structures were obtained without intravenous contrast. COMPARISON:  CT head earlier today. FINDINGS: Brain: No evidence for acute infarction, hemorrhage, mass lesion, hydrocephalus, or extra-axial fluid. Generalized atrophy. Mild to moderate T2 and FLAIR hyperintensities in the brain, largely confluent, also affecting the pons, consistent with small vessel disease. Vascular: Flow voids are maintained. Tiny focus of chronic hemorrhage near the vertex on the LEFT. Skull and upper cervical spine: Unremarkable visualized calvarium, skullbase, and cervical vertebrae. Pituitary, pineal, cerebellar tonsils unremarkable. No upper cervical cord lesions. Mild cervical spondylosis. Sinuses/Orbits: Significant fluid accumulation in the LEFT division sphenoid sinus. BILATERAL ethmoid sinus mucosal thickening. Previous ocular surgery. LEFT scleral banding. Other: None. IMPRESSION: Atrophy and  small vessel disease. No acute intracranial findings. No acute stroke. Electronically Signed   By: Staci Righter M.D.   On: 06/16/2017 14:56   Dg Chest Portable 1 View  Result Date: 06/16/2017 CLINICAL DATA:  Hypoxia EXAM: PORTABLE CHEST 1 VIEW COMPARISON:  August 26, 2009 FINDINGS: Endotracheal tube tip is 3.7 cm above the carina. Nasogastric tube tip and side port are below the diaphragm. No pneumothorax. There is airspace consolidation in the left lower lobe with small left pleural effusion. There is mild right base atelectasis. Lungs elsewhere clear. Heart is enlarged with pulmonary vascularity within normal limits. No adenopathy. There is aortic atherosclerosis. No evident bone lesions. IMPRESSION: Tube positions as described without pneumothorax. Left base consolidation consistent with pneumonia. Small left pleural effusion. Mild right base atelectasis. Stable cardiomegaly.  Aortic atherosclerosis evident. Aortic Atherosclerosis (ICD10-I70.0). Electronically Signed   By: Lowella Grip III M.D.   On: 06/16/2017 13:18   Ct Head Code Stroke Wo Contrast  Result Date: 06/16/2017 CLINICAL DATA:  Code stroke. Focal neuro deficit. Right-sided weakness EXAM: CT HEAD WITHOUT CONTRAST TECHNIQUE: Contiguous axial images were obtained from the base of the skull through the vertex without intravenous contrast. COMPARISON:  05/08/2017 FINDINGS: Brain: No evidence of acute infarction, hemorrhage, hydrocephalus, extra-axial collection or mass lesion/mass effect. Generalized atrophy that is advanced. Mild chronic microvascular ischemic change in the periventricular white matter. Vascular: Atherosclerotic calcification. Skull: No acute or aggressive finding. Sinuses/Orbits: Chronic left sphenoid sinusitis when compared to prior. Sequela of right maxillary chronic sinusitis with improved aeration since 2011 brain MRI. Bilateral cataract resection. Left scleral banding. Other: These results were communicated to Dr. Lorraine Lax  at 1:25 pmon 3/27/2019by text page via the [facility] messaging system. ASPECTS [facility] Stroke Program Early CT Score) -left hemisphere - Ganglionic level infarction (caudate, lentiform nuclei, internal capsule, insula, M1-M3 cortex): 7 - Supraganglionic infarction (M4-M6 cortex): 3 Total score (0-10 with 10 being normal): 10 IMPRESSION: 1. No acute finding or change from 05/08/2017. 2. Generalized atrophy. 3. Chronic left sphenoid sinusitis. Electronically Signed   By: Monte Fantasia M.D.   On: 06/16/2017 13:26     DISCUSSION:      This is an 84 year old diabetic who is unresponsive status post a generalized seizure.  There is no evidence of large vessel occlusion or stroke by CTA and MRI.  The focus of my work will be on ruling out metabolic provocations for  his seizure including concurrent infections or arrhythmias.  ASSESSMENT / PLAN: ASSESSMENT / PLAN:  PULMONARY A:   06/17/2017 -> acute resp failure meets extubation criteria  P:   extubate  CARDIOVASCULAR A:   06/17/2017 -> mild trop leak. Normal HR/BP  P:  Dc diprivan off MAR Check echo  RENAL  Intake/Output Summary (Last 24 hours) at 06/17/2017 0926 Last data filed at 06/17/2017 0800 Gross per 24 hour  Intake 3228.23 ml  Output 800 ml  Net 2428.23 ml   Recent Labs  Lab 06/16/17 1251 06/16/17 1257 06/17/17 0359  CREATININE 1.48* 1.40* 1.51*     A:   #baseline: creat 1.4mg % #current creat 1.5mg %  06/17/2017 -> mild bump in creat but not at AKI level. Mag < 2gm%  P:   Replete mag Continue LR Hold Losartan  GASTROINTESTINAL A:   NPO  P:   Continue NPO Dc ppi  HEMATOLOGIC Recent Labs  Lab 06/16/17 1251 06/16/17 1257 06/17/17 0359  HGB 13.8 13.6 12.1*  HCT 41.1 40.0 35.6*  WBC 17.6*  --  13.2*  PLT 156  --  129*    A:   #RBC: mild anemia #Platelet mild drop in platelet #WBC high wbc but improving ? Due t seizures  P:  - PRBC for hgb </= 6.9gm%    - exceptions are   -  if ACS  susepcted/confirmed then transfuse for hgb </= 8.0gm%,  or    -  active bleeding with hemodynamic instability, then transfuse regardless of hemoglobin value   At at all times try to transfuse 1 unit prbc as possible with exception of active hemorrhage    INFECTIOUS No results for input(s): PROCALCITON in the last 168 hours.  Results for orders placed or performed during the hospital encounter of 06/16/17  MRSA PCR Screening     Status: None   Collection Time: 06/16/17  7:25 PM  Result Value Ref Range Status   MRSA by PCR NEGATIVE NEGATIVE Final    Comment:        The GeneXpert MRSA Assay (FDA approved for NASAL specimens only), is one component of a comprehensive MRSA colonization surveillance program. It is not intended to diagnose MRSA infection nor to guide or monitor treatment for MRSA infections. Performed at [facility], [place] [address] [place], [place] [address]   Culture, respiratory (NON-Expectorated)     Status: None (Preliminary result)   Collection Time: 06/16/17 10:07 PM  Result Value Ref Range Status   Specimen Description TRACHEAL ASPIRATE  Final   Special Requests Normal  Final   Gram Stain   Final    FEW WBC PRESENT, PREDOMINANTLY PMN MODERATE GRAM POSITIVE COCCI IN PAIRS FEW GRAM NEGATIVE RODS FEW GRAM NEGATIVE DIPLOCOCCI RARE GRAM POSITIVE RODS Performed at [facility], [place] [address], [place], [place] [address]    Culture PENDING  Incomplete   Report Status PENDING  Incomplete    A:   Concern for penumonia on cxr + hihg wbc but no fever P:   Check PCT an dawait culture -> decide on stopping/narrowing zosyn Anti-infectives (From admission, onward)   Start     Dose/Rate Route Frequency Ordered Stop   06/17/17 0000  piperacillin-tazobactam (ZOSYN) IVPB 3.375 g  Status:  Discontinued     3.375 g 12.5 mL/hr over 240 Minutes Intravenous Every 8 hours 06/16/17 1600 06/17/17 0926   06/16/17 1615  piperacillin-tazobactam (ZOSYN) IVPB 3.375  g     3.375 g 100 mL/hr over  30 Minutes Intravenous  Once 06/16/17 1600 06/16/17 1820       ENDOCRINE A:   DM +   P:   ICU hyperglycemia protocol  NEUROLOGIC A:   #prior etoh  #Current: cam in with sieuzres   06/17/2017 -> following commands. No seizures. cEEG ongoing P:   RASS goal: 0 Dc diprivan Extubate IV keppra per neuro Neuro following   FAMILY  - Updates: 06/17/2017 --> none at bedside  - Inter-disciplinary family meet or Palliative Care meeting due by:  DAy 7. Current LOS is LOS 1 days   DISPO Keep in ICU   The patient is critically ill with multiple organ systems failure and requires high complexity decision making for assessment and support, frequent evaluation and titration of therapies, application of advanced monitoring technologies and extensive interpretation of multiple databases.   Critical Care Time devoted to patient care services described in this note is  30  Minutes. This time reflects time of care of this signee Dr Brand Males. This critical care time does not reflect procedure time, or teaching time or supervisory time of PA/NP/Med student/Med Resident etc but could involve care discussion time    Dr. Brand Males, M.D., [facility].C.P Pulmonary and Critical Care Medicine Staff Physician [facility] Pager: [phone], If no answer or between  15:00h - 7:00h: call [phone]  06/17/2017 9:26 AM

## 2017-06-18 ENCOUNTER — Inpatient Hospital Stay (HOSPITAL_COMMUNITY): Payer: Medicare HMO

## 2017-06-18 DIAGNOSIS — E1122 Type 2 diabetes mellitus with diabetic chronic kidney disease: Secondary | ICD-10-CM

## 2017-06-18 DIAGNOSIS — I10 Essential (primary) hypertension: Secondary | ICD-10-CM | POA: Diagnosis not present

## 2017-06-18 DIAGNOSIS — I5022 Chronic systolic (congestive) heart failure: Secondary | ICD-10-CM | POA: Diagnosis not present

## 2017-06-18 DIAGNOSIS — I361 Nonrheumatic tricuspid (valve) insufficiency: Secondary | ICD-10-CM

## 2017-06-18 DIAGNOSIS — I2510 Atherosclerotic heart disease of native coronary artery without angina pectoris: Secondary | ICD-10-CM | POA: Diagnosis not present

## 2017-06-18 DIAGNOSIS — N183 Chronic kidney disease, stage 3 (moderate): Secondary | ICD-10-CM

## 2017-06-18 DIAGNOSIS — R410 Disorientation, unspecified: Secondary | ICD-10-CM | POA: Diagnosis not present

## 2017-06-18 DIAGNOSIS — I2583 Coronary atherosclerosis due to lipid rich plaque: Secondary | ICD-10-CM

## 2017-06-18 DIAGNOSIS — R569 Unspecified convulsions: Secondary | ICD-10-CM | POA: Diagnosis not present

## 2017-06-18 DIAGNOSIS — R4182 Altered mental status, unspecified: Secondary | ICD-10-CM | POA: Diagnosis not present

## 2017-06-18 LAB — GLUCOSE, CAPILLARY
GLUCOSE-CAPILLARY: 112 mg/dL — AB (ref 65–99)
Glucose-Capillary: 99 mg/dL (ref 65–99)
Glucose-Capillary: 147 mg/dL — ABNORMAL HIGH (ref 65–99)

## 2017-06-18 LAB — ECHOCARDIOGRAM COMPLETE: Weight: 3093.49 oz

## 2017-06-18 LAB — CBC WITH DIFFERENTIAL/PLATELET
BASOS PCT: 0 %
Basophils Absolute: 0 10*3/uL (ref 0.0–0.1)
Eosinophils Absolute: 0.5 10*3/uL (ref 0.0–0.7)
Eosinophils Relative: 4 %
HEMATOCRIT: 31.8 % — AB (ref 39.0–52.0)
HEMOGLOBIN: 10.7 g/dL — AB (ref 13.0–17.0)
LYMPHS PCT: 26 %
Lymphs Abs: 2.9 10*3/uL (ref 0.7–4.0)
MCH: 31.6 pg (ref 26.0–34.0)
MCHC: 33.6 g/dL (ref 30.0–36.0)
MCV: 93.8 fL (ref 78.0–100.0)
MONOS PCT: 11 %
Monocytes Absolute: 1.3 10*3/uL — ABNORMAL HIGH (ref 0.1–1.0)
NEUTROS ABS: 6.9 10*3/uL (ref 1.7–7.7)
NEUTROS PCT: 59 %
Platelets: 115 10*3/uL — ABNORMAL LOW (ref 150–400)
RBC: 3.39 MIL/uL — ABNORMAL LOW (ref 4.22–5.81)
RDW: 12.4 % (ref 11.5–15.5)
WBC: 11.5 10*3/uL — ABNORMAL HIGH (ref 4.0–10.5)

## 2017-06-18 LAB — BASIC METABOLIC PANEL
ANION GAP: 7 (ref 5–15)
BUN: 36 mg/dL — ABNORMAL HIGH (ref 6–20)
CALCIUM: 8.4 mg/dL — AB (ref 8.9–10.3)
CO2: 21 mmol/L — AB (ref 22–32)
Chloride: 106 mmol/L (ref 101–111)
Creatinine, Ser: 1.59 mg/dL — ABNORMAL HIGH (ref 0.61–1.24)
GFR calc non Af Amer: 39 mL/min — ABNORMAL LOW (ref >60)
GFR, EST AFRICAN AMERICAN: 45 mL/min — AB (ref >60)
Glucose, Bld: 104 mg/dL — ABNORMAL HIGH (ref 65–99)
POTASSIUM: 4.1 mmol/L (ref 3.5–5.1)
Sodium: 134 mmol/L — ABNORMAL LOW (ref 135–145)

## 2017-06-18 LAB — URINE CULTURE
Culture: NO GROWTH
Special Requests: NORMAL

## 2017-06-18 LAB — MAGNESIUM: Magnesium: 1.9 mg/dL (ref 1.7–2.4)

## 2017-06-18 LAB — PHOSPHORUS: PHOSPHORUS: 3.3 mg/dL (ref 2.5–4.6)

## 2017-06-18 LAB — PROCALCITONIN: Procalcitonin: <0.1 ng/mL

## 2017-06-18 MED ORDER — SODIUM CHLORIDE 0.9 % IV SOLN
3.0000 g | Freq: Three times a day (TID) | INTRAVENOUS | Status: DC
  Administered 2017-06-18 (×2): 3 g via INTRAVENOUS
  Filled 2017-06-18 (×3): qty 3

## 2017-06-18 MED ORDER — PERFLUTREN LIPID MICROSPHERE
1.0000 mL | INTRAVENOUS | Status: DC | PRN
  Filled 2017-06-18: qty 10

## 2017-06-18 MED ORDER — PERFLUTREN LIPID MICROSPHERE
INTRAVENOUS | Status: AC
  Administered 2017-06-19: 2 mL via INTRAVENOUS
  Filled 2017-06-18: qty 10

## 2017-06-18 MED ORDER — ENOXAPARIN SODIUM 40 MG/0.4ML ~~LOC~~ SOLN
40.0000 mg | SUBCUTANEOUS | Status: DC
  Administered 2017-06-19: 40 mg via SUBCUTANEOUS
  Filled 2017-06-18: qty 0.4

## 2017-06-18 MED ORDER — AMOXICILLIN-POT CLAVULANATE 875-125 MG PO TABS
1.0000 | ORAL_TABLET | Freq: Two times a day (BID) | ORAL | Status: AC
  Administered 2017-06-18 – 2017-06-20 (×5): 1 via ORAL
  Filled 2017-06-18 (×5): qty 1

## 2017-06-18 NOTE — Progress Notes (Signed)
Physical Therapy Treatment Patient Details Name: Daniel [name] MRN: [id number] DOB: 05-09-1933 Today's Date: 06/18/2017    History of Present Illness This is an 84 year old male presenting with altered mental status, seizure, right-sided weakness and slurred speech.  Video-EEG monitoring was performed to evaluate for seizures.pmhx of CAD, HFrEF, IDDM, CKD, tonic clonic seizure 15 years ago, and prior alcohol abuse (sober 4 years).     PT Comments    Pt was agreeable to participate in therapy this PM. We were able to stand from an elevated bed with RW and take a few side steps up the edge of the bed.  He is not yet safe to attempt gait forward without a second person assisting as he has a significant posterior lean in standing and with side stepping.  He remains appropriate for SNF placement at discharge.     Follow Up Recommendations  SNF     Equipment Recommendations  None recommended by PT    Recommendations for Other Services   NA     Precautions / Restrictions Precautions Precautions: Fall    Mobility  Bed Mobility Overal bed mobility: Needs Assistance Bed Mobility: Supine to Sit;Sit to Supine     Supine to sit: Min assist;HOB elevated Sit to supine: Supervision   General bed mobility comments: Min assist to initiate and ultimately follow through on basic command to sit up.  Pt agreeable, but unable to figure out what I wanted him to do without me initiating movement of his legs to EOB and using visual and tactile cues waving him over to the side.  Min assist at legs and separately at trunk. Pt using gravity and momentum to return to supine with min assist to adjust legs once back in bed.   Transfers Overall transfer level: Needs assistance Equipment used: Rolling walker (2 wheeled) Transfers: Sit to/from Stand Sit to Stand: Mod assist;From elevated surface         General transfer comment: Mod assist to stand from elevated surface with RW.  Multiple attempts  made before successful as pt's feet kept sliding forward as his buttocks and trunk was leaning backwards.  Finally successful with therapist standing in front of him blocking his feet and assisting his trunk forward with gait belt.    Ambulation/Gait Ambulation/Gait assistance: Mod assist Ambulation Distance (Feet): 3 Feet Assistive device: Rolling walker (2 wheeled) Gait Pattern/deviations: Step-to pattern;Leaning posteriorly(side step)     General Gait Details: Pt was able to with mod assist, side step up towards HOB.  Assist to help keep trunk forward and feet underneath him.            Balance Overall balance assessment: Needs assistance Sitting-balance support: Feet supported;Bilateral upper extremity supported Sitting balance-Leahy Scale: Fair Sitting balance - Comments: close supervision EOB with bil UE prop.  Postural control: Posterior lean Standing balance support: Bilateral upper extremity supported Standing balance-Leahy Scale: Poor Standing balance comment: mod assist in standing to prevent posterior LOB.                             Cognition Arousal/Alertness: Awake/alert Behavior During Therapy: WFL for tasks assessed/performed Overall Cognitive Status: No family/caregiver present to determine baseline cognitive functioning                                 General Comments: Pt able to tell me he is at [facility] because he fell.  He has decreased safety awareness and decreased awareness of deficits as he doesn't understand why he can't go home with his elderly wife.              Pertinent Vitals/Pain Pain Assessment: No/denies pain           PT Goals (current goals can now be found in the care plan section) Acute Rehab PT Goals Patient Stated Goal: to get better Progress towards PT goals: Progressing toward goals    Frequency    Min 2X/week      PT Plan Frequency needs to be updated       AM-PAC PT "6  Clicks" Daily Activity  Outcome Measure  Difficulty turning over in bed (including adjusting bedclothes, sheets and blankets)?: Unable Difficulty moving from lying on back to sitting on the side of the bed? : Unable Difficulty sitting down on and standing up from a chair with arms (e.g., wheelchair, bedside commode, etc,.)?: Unable Help needed moving to and from a bed to chair (including a wheelchair)?: A Lot Help needed walking in hospital room?: A Lot Help needed climbing 3-5 steps with a railing? : Total 6 Click Score: 8    End of Session Equipment Utilized During Treatment: Gait belt Activity Tolerance: Patient tolerated treatment well Patient left: in bed;with call bell/phone within reach;with bed alarm set;with restraints reapplied(R hand mitten re applied)   PT Visit Diagnosis: Unsteadiness on feet (R26.81);Muscle weakness (generalized) (M62.81)     Time: 6825-7493 PT Time Calculation (min) (ACUTE ONLY): 14 min  Charges:  $Therapeutic Activity: 8-22 mins    Danay Mckellar B. [place], [place], DPT [phone]                     06/18/2017, 6:27 PM

## 2017-06-18 NOTE — Progress Notes (Signed)
PROGRESS NOTE    Daniel [name]  UXN:[id number] DOB: Jul 20, 1933 DOA: 06/16/2017 PCP: Jani Gravel, MD      Brief Narrative:  Daniel [name] is a 84 yo M with DM, CHF prev 35% now 55% and CAD who presented with acute manual clumsiness followed by right-sided weakness and slurred speech.  His wife had taken his blood sugar earlier and it was 112.  EMS was called and he had a generalized seizure in route he has been intubated and is now mechanically ventilated.  He was worked up as a code stroke with a negative head CT and an MRI that does not show any acute infarct there is a small area of hemorrhage near the left vertex.  CTA showed no large vessel occlusion there is bilateral carotid bifurcation stenosis and the vertebrals are stenotic at their origins as well.  He has had one seizure in his life previously at a time where he was apparently unstable and had 2 coronary stents placed.  He has had no further seizure activity since that time.     Assessment & Plan:  Seizure EEG showed no epileptiform activity, but abnormal focus.   Has history of lawnmower accident "6 years ago" rolled a riding mower, has scar on right temple.  -Continue Keppra -Outpatient follow up with Neurology -PT eval  Aspiration pneumonia Procalcitonin <0.1 initially, repeat pending. -Stop Zosyn, start Unasyn for now, d/c if repeat procal negative and sputum negative -Follow sputum culture  Coronary disease Hypertension Elevated troponin Troponin elevation low and flat, this is demand ischemia only.  BP soft. -Hold Losartan -Continue aspirin, statin  Diabetes -Continue SSI  Chronic kidney disease stage III Baseline 1.4  Other medications -Continue eye drops -Continue finasteride -Continue SSRI        DVT prophylaxis: Change to Lovenox Code Status: DO NOT RESUSCITATE Family Communication: None present MDM and disposition Plan: The below labs and imaging reports were reviewed.  The patient's status  is clinically stable, improving.  He presented with a new seizure, required intubation.  Now extubated and respiratory status normal.  On new Keppra and stable.  PT eval and then dispo pending that.  Possibly tomorrow.     Consultants:   Neurology  CCM  Procedures:   Intubated 3/27   CXR 3/27 IMPRESSION: Tube positions as described without pneumothorax. Left base consolidation consistent with pneumonia. Small left pleural effusion. Mild right base atelectasis.  Stable cardiomegaly.  Aortic atherosclerosis evident.  Aortic Atherosclerosis (ICD10-I70.0).    CTA head and neck IMPRESSION: 1. No emergent large vessel occlusion. 2. High-grade stenosis at the carotid bifurcations bilaterally. 3. Moderate stenoses within the cavernous internal carotid arteries bilaterally. 4. High-grade stenosis at the origin of the vertebral arteries bilaterally. 5. Fetal type left posterior cerebral artery. 6. Normal variant circle of Willis without significant proximal stenosis, aneurysm, or branch vessel occlusion above the supraclinoid segments. 7. Multilevel spondylosis of the cervical spine as described. Foraminal disease is greatest at C3-4 and C6-7. 8. The endotracheal tube terminates only 2.5 cm above the carina and could be pulled back 1-2 cm for more optimal positioning. 9.  Aortic Atherosclerosis (ICD10-I70.0).    MRI brain 3./27 IMPRESSION: Atrophy and small vessel disease. No acute intracranial findings. No acute stroke.   Extubated 3/28      Antimicrobials:   Zosyn 3/27 >> 3/28  Unasyn 3/28 >>   Subjective: Feels fine.  A little sleepy.  No headache, chest pain.  No further sezirues.  No dspnea, fever,  cough.  No confusion.   Objective: Vitals:   06/18/17 0000 06/18/17 0100 06/18/17 0400 06/18/17 0735  BP: (!) 112/57 (!) 128/51 (!) 107/48 (!) 122/55  Pulse: 80 76 77 76  Resp: (!) 22 20 20 16   Temp:  97.8 F (36.6 C) 98 F (36.7 C) 98 F (36.7  C)  TempSrc:  Oral Oral Axillary  SpO2: 96% 94% 95% 97%  Weight:  87.5 kg (192 lb 14.4 oz) 87.7 kg (193 lb 5.5 oz)     Intake/Output Summary (Last 24 hours) at 06/18/2017 0757 Last data filed at 06/18/2017 0400 Gross per 24 hour  Intake 1532.5 ml  Output 545 ml  Net 987.5 ml   Filed Weights   06/17/17 0432 06/18/17 0100 06/18/17 0400  Weight: 85.9 kg (189 lb 6 oz) 87.5 kg (192 lb 14.4 oz) 87.7 kg (193 lb 5.5 oz)    Examination: General appearance: Elderly adult male, alert and in no acute distress.  Lying in bed, seems a little dazed. HEENT: Anicteric, conjunctiva pink, lids and lashes normal. No nasal deformity, discharge, epistaxis.  Lips moist.  Oropharynx moist without lesions. Skin: Warm and dry.  No jaundice.  No suspicious rashes or lesions. Cardiac: RRR, nl S1-S2, no murmurs appreciated.  No lower extremity edema. Respiratory: Normal respiratory rate and rhythm.  CTAB without rales or wheezes. Abdomen: Abdomen soft.  No TTP or guarding. MSK: No deformities or effusions. Neuro: Awake and alert.  Cranial nerves normal.  Sensation intact light touch and sharp on face, arms, legs symmetrically.  Speech fluent.    Psych: Sensorium intact and responding to questions, attention normal. Affect flat.  Judgment and insight appear normal.  Some psychomotor slowing is noted, but he is oriented to person place and time.  Just does not know what happened.    Data Reviewed: I have personally reviewed following labs and imaging studies:  CBC: Recent Labs  Lab 06/16/17 1251 06/16/17 1257 06/17/17 0359  WBC 17.6*  --  13.2*  NEUTROABS 5.3  --  8.8*  HGB 13.8 13.6 12.1*  HCT 41.1 40.0 35.6*  MCV 96.7  --  92.7  PLT 156  --  703*   Basic Metabolic Panel: Recent Labs  Lab 06/16/17 1251 06/16/17 1257 06/17/17 0359  NA 137 139 135  K 4.6 4.6 4.8  CL 108 109 105  CO2 13*  --  19*  GLUCOSE 138* 130* 111*  BUN 35* 36* 33*  CREATININE 1.48* 1.40* 1.51*  CALCIUM 9.2  --  8.5*    MG  --   --  1.9   GFR: Estimated Creatinine Clearance: 41.3 mL/min (A) (by C-G formula based on SCr of 1.51 mg/dL (H)). Liver Function Tests: Recent Labs  Lab 06/16/17 1251 06/17/17 0359  AST 34 36  ALT 21 17  ALKPHOS 55 43  BILITOT 0.9 1.1  PROT 7.1 6.0*  ALBUMIN 3.7 3.1*   No results for input(s): LIPASE, AMYLASE in the last 168 hours. No results for input(s): AMMONIA in the last 168 hours. Coagulation Profile: Recent Labs  Lab 06/16/17 1251  INR 1.19   Cardiac Enzymes: Recent Labs  Lab 06/16/17 1702 06/16/17 2328 06/17/17 0359  TROPONINI 0.06* 0.09* 0.10*   BNP (last 3 results) No results for input(s): PROBNP in the last 8760 hours. HbA1C: No results for input(s): HGBA1C in the last 72 hours. CBG: Recent Labs  Lab 06/17/17 1246 06/17/17 1518 06/17/17 1934 06/17/17 2331 06/18/17 0608  GLUCAP 141* 118* 108* 95 99  Lipid Profile: No results for input(s): CHOL, HDL, LDLCALC, TRIG, CHOLHDL, LDLDIRECT in the last 72 hours. Thyroid Function Tests: No results for input(s): TSH, T4TOTAL, FREET4, T3FREE, THYROIDAB in the last 72 hours. Anemia Panel: No results for input(s): VITAMINB12, FOLATE, FERRITIN, TIBC, IRON, RETICCTPCT in the last 72 hours. Urine analysis:    Component Value Date/Time   COLORURINE YELLOW 06/17/2017 0053   APPEARANCEUR CLEAR 06/17/2017 0053   LABSPEC 1.027 06/17/2017 0053   PHURINE 6.0 06/17/2017 0053   GLUCOSEU NEGATIVE 06/17/2017 0053   HGBUR NEGATIVE 06/17/2017 0053   BILIRUBINUR NEGATIVE 06/17/2017 0053   KETONESUR NEGATIVE 06/17/2017 0053   PROTEINUR 100 (A) 06/17/2017 0053   UROBILINOGEN 1.0 04/17/2013 1014   NITRITE NEGATIVE 06/17/2017 0053   LEUKOCYTESUR NEGATIVE 06/17/2017 0053   Sepsis Labs: @LABRCNTIP (procalcitonin:4,lacticacidven:4)  ) Recent Results (from the past 240 hour(s))  MRSA PCR Screening     Status: None   Collection Time: 06/16/17  7:25 PM  Result Value Ref Range Status   MRSA by PCR NEGATIVE  NEGATIVE Final    Comment:        The GeneXpert MRSA Assay (FDA approved for NASAL specimens only), is one component of a comprehensive MRSA colonization surveillance program. It is not intended to diagnose MRSA infection nor to guide or monitor treatment for MRSA infections. Performed at [facility], [place] [address], [place], [place] [address]   Culture, respiratory (NON-Expectorated)     Status: None (Preliminary result)   Collection Time: 06/16/17 10:07 PM  Result Value Ref Range Status   Specimen Description TRACHEAL ASPIRATE  Final   Special Requests Normal  Final   Gram Stain   Final    FEW WBC PRESENT, PREDOMINANTLY PMN MODERATE GRAM POSITIVE COCCI IN PAIRS FEW GRAM NEGATIVE RODS FEW GRAM NEGATIVE DIPLOCOCCI RARE GRAM POSITIVE RODS Performed at [facility], [place] [address] [place], [place] [address]    Culture PENDING  Incomplete   Report Status PENDING  Incomplete         Radiology Studies: Ct Angio Head W Or Wo Contrast  Result Date: 06/16/2017 CLINICAL DATA:  Right-sided weakness.  Weakness prior to seizure. EXAM: CT ANGIOGRAPHY HEAD AND NECK TECHNIQUE: Multidetector CT imaging of the head and neck was performed using the standard protocol during bolus administration of intravenous contrast. Multiplanar CT image reconstructions and MIPs were obtained to evaluate the vascular anatomy. Carotid stenosis measurements (when applicable) are obtained utilizing NASCET criteria, using the distal internal carotid diameter as the denominator. CONTRAST:  86mL ISOVUE-370 IOPAMIDOL (ISOVUE-370) INJECTION 76% COMPARISON:  CT head without contrast from the same day. FINDINGS: CTA NECK FINDINGS Aortic arch: There is a common origin of the left common carotid artery in the innominate artery. Atherosclerotic changes are noted at the origin the great vessels and in the arch without aneurysm or significant focal stenosis. Right carotid system: Atherosclerotic calcifications are  noted along the course of the right common carotid artery without a significant luminal stenosis proximal to the bifurcation. There is a high-grade stenosis at the bifurcation with narrowing of the lumen between calcified plaque to less than 1 mm. This constitutes a greater than 70% stenosis relative to the more distal vessel. No other tandem stenosis is present in the cervical right ICA. There is some tortuosity just below the skull base without focal stenosis. Left carotid system: The left common carotid artery demonstrates distal mural calcification without significant stenosis proximal to the bifurcation. There is dense calcified plaque at the bifurcation  which narrows the lumen to approximately 1 mm. The more distal vessel measures 4.5 mm. This constitutes a severe stenosis of greater than 70% relative to the more distal vessel. No tandem stenosis is present in the cervical left ICA. There is tortuosity just proximal skull base. Vertebral arteries: Dense calcifications are present at the origin of the vertebral arteries from the subclavian arteries bilaterally. The left vertebral artery is slightly dominant to the right. No other tandem stenoses are present in the neck. Skeleton: Multilevel degenerative changes are present in the cervical spine. There is chronic loss of disc height at all levels from C3-4 through C6-7. Slight retrolisthesis is present at C3-4 and C4-5. Uncovertebral spurring contributes to foraminal narrowing greatest at C3-4 and C6-7. No focal lytic or blastic lesions are present. Other neck: The patient is intubated. The endotracheal tube terminates only 2.5 cm above the carina and could be pulled back 1-2 cm for more optimal positioning. NG tube is in a dilated esophagus. Thyroid is unremarkable. No significant adenopathy is present. Salivary glands are within normal limits. Upper chest: The lung apices demonstrate mild dependent atelectasis. No focal nodule or mass lesion is present. Review  of the MIP images confirms the above findings CTA HEAD FINDINGS Anterior circulation: Atherosclerotic calcifications result in moderate stenoses of the cavernous internal carotid arteries bilaterally. The ICA termini are normal. The A1 and M1 segments are normal. The anterior communicating artery is patent. MCA bifurcations are intact. ACA and MCA branch vessels are within normal limits. Posterior circulation: Below the dura. The left PICA originates above the dura. The vertebrobasilar junction is normal. The left posterior cerebral artery is of fetal type. The right posterior cerebral artery originates from the basilar tip. PCA branch vessels are within normal limits bilaterally. Venous sinuses: The dural sinuses are patent. The right transverse sinus is dominant. The straight sinus and deep cerebral veins are intact. Cortical veins are unremarkable. Anatomic variants: Fetal type left posterior cerebral artery. Review of the MIP images confirms the above findings IMPRESSION: 1. No emergent large vessel occlusion. 2. High-grade stenosis at the carotid bifurcations bilaterally. 3. Moderate stenoses within the cavernous internal carotid arteries bilaterally. 4. High-grade stenosis at the origin of the vertebral arteries bilaterally. 5. Fetal type left posterior cerebral artery. 6. Normal variant circle of Willis without significant proximal stenosis, aneurysm, or branch vessel occlusion above the supraclinoid segments. 7. Multilevel spondylosis of the cervical spine as described. Foraminal disease is greatest at C3-4 and C6-7. 8. The endotracheal tube terminates only 2.5 cm above the carina and could be pulled back 1-2 cm for more optimal positioning. 9.  Aortic Atherosclerosis (ICD10-I70.0). Electronically Signed   By: San Morelle M.D.   On: 06/16/2017 13:58   Ct Angio Neck W Or Wo Contrast  Result Date: 06/16/2017 CLINICAL DATA:  Right-sided weakness.  Weakness prior to seizure. EXAM: CT ANGIOGRAPHY  HEAD AND NECK TECHNIQUE: Multidetector CT imaging of the head and neck was performed using the standard protocol during bolus administration of intravenous contrast. Multiplanar CT image reconstructions and MIPs were obtained to evaluate the vascular anatomy. Carotid stenosis measurements (when applicable) are obtained utilizing NASCET criteria, using the distal internal carotid diameter as the denominator. CONTRAST:  17mL ISOVUE-370 IOPAMIDOL (ISOVUE-370) INJECTION 76% COMPARISON:  CT head without contrast from the same day. FINDINGS: CTA NECK FINDINGS Aortic arch: There is a common origin of the left common carotid artery in the innominate artery. Atherosclerotic changes are noted at the origin the great vessels and in  the arch without aneurysm or significant focal stenosis. Right carotid system: Atherosclerotic calcifications are noted along the course of the right common carotid artery without a significant luminal stenosis proximal to the bifurcation. There is a high-grade stenosis at the bifurcation with narrowing of the lumen between calcified plaque to less than 1 mm. This constitutes a greater than 70% stenosis relative to the more distal vessel. No other tandem stenosis is present in the cervical right ICA. There is some tortuosity just below the skull base without focal stenosis. Left carotid system: The left common carotid artery demonstrates distal mural calcification without significant stenosis proximal to the bifurcation. There is dense calcified plaque at the bifurcation which narrows the lumen to approximately 1 mm. The more distal vessel measures 4.5 mm. This constitutes a severe stenosis of greater than 70% relative to the more distal vessel. No tandem stenosis is present in the cervical left ICA. There is tortuosity just proximal skull base. Vertebral arteries: Dense calcifications are present at the origin of the vertebral arteries from the subclavian arteries bilaterally. The left vertebral  artery is slightly dominant to the right. No other tandem stenoses are present in the neck. Skeleton: Multilevel degenerative changes are present in the cervical spine. There is chronic loss of disc height at all levels from C3-4 through C6-7. Slight retrolisthesis is present at C3-4 and C4-5. Uncovertebral spurring contributes to foraminal narrowing greatest at C3-4 and C6-7. No focal lytic or blastic lesions are present. Other neck: The patient is intubated. The endotracheal tube terminates only 2.5 cm above the carina and could be pulled back 1-2 cm for more optimal positioning. NG tube is in a dilated esophagus. Thyroid is unremarkable. No significant adenopathy is present. Salivary glands are within normal limits. Upper chest: The lung apices demonstrate mild dependent atelectasis. No focal nodule or mass lesion is present. Review of the MIP images confirms the above findings CTA HEAD FINDINGS Anterior circulation: Atherosclerotic calcifications result in moderate stenoses of the cavernous internal carotid arteries bilaterally. The ICA termini are normal. The A1 and M1 segments are normal. The anterior communicating artery is patent. MCA bifurcations are intact. ACA and MCA branch vessels are within normal limits. Posterior circulation: Below the dura. The left PICA originates above the dura. The vertebrobasilar junction is normal. The left posterior cerebral artery is of fetal type. The right posterior cerebral artery originates from the basilar tip. PCA branch vessels are within normal limits bilaterally. Venous sinuses: The dural sinuses are patent. The right transverse sinus is dominant. The straight sinus and deep cerebral veins are intact. Cortical veins are unremarkable. Anatomic variants: Fetal type left posterior cerebral artery. Review of the MIP images confirms the above findings IMPRESSION: 1. No emergent large vessel occlusion. 2. High-grade stenosis at the carotid bifurcations bilaterally. 3.  Moderate stenoses within the cavernous internal carotid arteries bilaterally. 4. High-grade stenosis at the origin of the vertebral arteries bilaterally. 5. Fetal type left posterior cerebral artery. 6. Normal variant circle of Willis without significant proximal stenosis, aneurysm, or branch vessel occlusion above the supraclinoid segments. 7. Multilevel spondylosis of the cervical spine as described. Foraminal disease is greatest at C3-4 and C6-7. 8. The endotracheal tube terminates only 2.5 cm above the carina and could be pulled back 1-2 cm for more optimal positioning. 9.  Aortic Atherosclerosis (ICD10-I70.0). Electronically Signed   By: San Morelle M.D.   On: 06/16/2017 13:58   Mr Brain Wo Contrast  Result Date: 06/16/2017 CLINICAL DATA:  Unresponsive.  Suspected  stroke. EXAM: MRI HEAD WITHOUT CONTRAST TECHNIQUE: Multiplanar, multiecho pulse sequences of the brain and surrounding structures were obtained without intravenous contrast. COMPARISON:  CT head earlier today. FINDINGS: Brain: No evidence for acute infarction, hemorrhage, mass lesion, hydrocephalus, or extra-axial fluid. Generalized atrophy. Mild to moderate T2 and FLAIR hyperintensities in the brain, largely confluent, also affecting the pons, consistent with small vessel disease. Vascular: Flow voids are maintained. Tiny focus of chronic hemorrhage near the vertex on the LEFT. Skull and upper cervical spine: Unremarkable visualized calvarium, skullbase, and cervical vertebrae. Pituitary, pineal, cerebellar tonsils unremarkable. No upper cervical cord lesions. Mild cervical spondylosis. Sinuses/Orbits: Significant fluid accumulation in the LEFT division sphenoid sinus. BILATERAL ethmoid sinus mucosal thickening. Previous ocular surgery. LEFT scleral banding. Other: None. IMPRESSION: Atrophy and small vessel disease. No acute intracranial findings. No acute stroke. Electronically Signed   By: Staci Righter M.D.   On: 06/16/2017 14:56    Dg Chest Portable 1 View  Result Date: 06/16/2017 CLINICAL DATA:  Hypoxia EXAM: PORTABLE CHEST 1 VIEW COMPARISON:  August 26, 2009 FINDINGS: Endotracheal tube tip is 3.7 cm above the carina. Nasogastric tube tip and side port are below the diaphragm. No pneumothorax. There is airspace consolidation in the left lower lobe with small left pleural effusion. There is mild right base atelectasis. Lungs elsewhere clear. Heart is enlarged with pulmonary vascularity within normal limits. No adenopathy. There is aortic atherosclerosis. No evident bone lesions. IMPRESSION: Tube positions as described without pneumothorax. Left base consolidation consistent with pneumonia. Small left pleural effusion. Mild right base atelectasis. Stable cardiomegaly.  Aortic atherosclerosis evident. Aortic Atherosclerosis (ICD10-I70.0). Electronically Signed   By: Lowella Grip III M.D.   On: 06/16/2017 13:18   Ct Head Code Stroke Wo Contrast  Result Date: 06/16/2017 CLINICAL DATA:  Code stroke. Focal neuro deficit. Right-sided weakness EXAM: CT HEAD WITHOUT CONTRAST TECHNIQUE: Contiguous axial images were obtained from the base of the skull through the vertex without intravenous contrast. COMPARISON:  05/08/2017 FINDINGS: Brain: No evidence of acute infarction, hemorrhage, hydrocephalus, extra-axial collection or mass lesion/mass effect. Generalized atrophy that is advanced. Mild chronic microvascular ischemic change in the periventricular white matter. Vascular: Atherosclerotic calcification. Skull: No acute or aggressive finding. Sinuses/Orbits: Chronic left sphenoid sinusitis when compared to prior. Sequela of right maxillary chronic sinusitis with improved aeration since 2011 brain MRI. Bilateral cataract resection. Left scleral banding. Other: These results were communicated to Dr. Lorraine Lax at 1:25 pmon 3/27/2019by text page via the [facility] messaging system. ASPECTS [facility] Stroke Program Early CT Score) -left hemisphere -  Ganglionic level infarction (caudate, lentiform nuclei, internal capsule, insula, M1-M3 cortex): 7 - Supraganglionic infarction (M4-M6 cortex): 3 Total score (0-10 with 10 being normal): 10 IMPRESSION: 1. No acute finding or change from 05/08/2017. 2. Generalized atrophy. 3. Chronic left sphenoid sinusitis. Electronically Signed   By: Monte Fantasia M.D.   On: 06/16/2017 13:26        Scheduled Meds: . aspirin  81 mg Oral Daily  . chlorhexidine  15 mL Mouth Rinse BID  . citalopram  10 mg Oral Daily  . finasteride  5 mg Oral Daily  . heparin  5,000 Units Subcutaneous Q8H  . insulin aspart  0-9 Units Subcutaneous TID WC  . latanoprost  1 drop Both Eyes QHS  . levETIRAcetam  750 mg Oral BID  . mouth rinse  15 mL Mouth Rinse q12n4p  . rosuvastatin  10 mg Oral q1800  . vitamin C  500 mg Oral Daily   Continuous Infusions: .  lactated ringers 75 mL/hr at 06/18/17 0000  . piperacillin-tazobactam (ZOSYN)  IV Stopped (06/18/17 0505)     LOS: 2 days    Time spent: 35 minutes    Edwin Dada, MD [facility] 06/18/2017, 7:57 AM     Pager [phone] --- please page though AMION:  [email or web address] If 7PM-7AM, please contact night-coverage

## 2017-06-18 NOTE — NC FL2 (Signed)
[place] MEDICAID FL2 LEVEL OF CARE SCREENING TOOL     IDENTIFICATION  Patient Name: Daniel [name] Birthdate: [birth year] Sex: male Admission Date (Current Location): 06/16/2017  [facility] and [place] Number:  Herbalist and Address:  [facility], [place] [address] [place], [place] [address]      Provider Number: [id number]  Attending Physician Name and Address:  Edwin Dada, *  Relative Name and Phone Number:       Current Level of Care: Hospital Recommended Level of Care: [place] Prior Approval Number:    Date Approved/Denied:   PASRR Number: [id number]  Discharge Plan: SNF    Current Diagnoses: Patient Active Problem List   Diagnosis Date Noted  . Seizure ([place]) 06/16/2017  . Type II diabetes mellitus with neurological manifestations ([place]) 03/05/2015  . Porokeratosis 03/05/2015  . Chronic systolic CHF (congestive heart failure) ([place]) 01/28/2015  . Coronary artery disease 01/28/2015  . Bruit of left carotid artery 01/28/2015  . Acute on chronic renal failure ([place]) 04/17/2013  . Dehydration 04/17/2013  . Alcohol dependence ([place]) 04/17/2013  . Transaminasemia 04/17/2013  . DM2 (diabetes mellitus, type 2) ([place]) 04/17/2013  . Near syncope 04/17/2013  . UTI (lower urinary tract infection) 04/17/2013  . Hematuria 04/17/2013    Orientation RESPIRATION BLADDER Height & Weight     Self, Situation, Place  Normal Incontinent, External catheter(catheter placed 06/17/17) Weight: 193 lb 5.5 oz (87.7 kg) Height:     BEHAVIORAL SYMPTOMS/MOOD NEUROLOGICAL BOWEL NUTRITION STATUS    Convulsions/Seizures Incontinent Diet(see DC summary)  AMBULATORY STATUS COMMUNICATION OF NEEDS Skin   Extensive Assist Verbally Normal                       Personal Care Assistance Level of Assistance  Bathing, Feeding, Dressing Bathing Assistance: Maximum assistance Feeding assistance: Limited assistance Dressing  Assistance: Maximum assistance     Functional Limitations Info  Sight, Hearing, Speech Sight Info: Adequate Hearing Info: Adequate Speech Info: Adequate    SPECIAL CARE FACTORS FREQUENCY  PT (By licensed PT), OT (By licensed OT)     PT Frequency: 5x/wk OT Frequency: 5x/wk            Contractures Contractures Info: Not present    Additional Factors Info  Code Status, Allergies, Psychotropic, Insulin Sliding Scale Code Status Info: DNR Allergies Info: NKA Psychotropic Info: Celexa 10mg  daily Insulin Sliding Scale Info: 0-9 units 3x/day with meals       Current Medications (06/18/2017):  This is the current hospital active medication list Current Facility-Administered Medications  Medication Dose Route Frequency Provider Last Rate Last Dose  . PERFLUTREN LIPID MICROSPHERE injection SUSP           . Ampicillin-Sulbactam (UNASYN) 3 g in sodium chloride 0.9 % 100 mL IVPB  3 g Intravenous Q8H Danford, Suann Larry, MD   Stopped at 06/18/17 1104  . aspirin chewable tablet 81 mg  81 mg Oral Daily Brand Males, MD   81 mg at 06/18/17 0829  . chlorhexidine (PERIDEX) 0.12 % solution 15 mL  15 mL Mouth Rinse BID Brand Males, MD   15 mL at 06/18/17 0829  . citalopram (CELEXA) tablet 10 mg  10 mg Oral Daily Sampson Goon, MD   10 mg at 06/18/17 4742  . enoxaparin (LOVENOX) injection 40 mg  40 mg Subcutaneous Q24H Danford, Suann Larry, MD      . finasteride (PROSCAR) tablet 5 mg  5 mg Oral Daily Sampson Goon, MD   5 mg at 06/18/17 8412  . insulin aspart (novoLOG) injection 0-9 Units  0-9 Units Subcutaneous TID WC Sampson Goon, MD   1 Units at 06/17/17 1252  . lactated ringers infusion   Intravenous Continuous Sampson Goon, MD 75 mL/hr at 06/18/17 0000    . latanoprost (XALATAN) 0.005 % ophthalmic solution 1 drop  1 drop Both Eyes QHS Sampson Goon, MD   1 drop at 06/17/17 2201  . levETIRAcetam (KEPPRA) 100 MG/ML solution 750 mg  750 mg Oral BID Charlton Haws,  RPH   750 mg at 06/18/17 8208  . MEDLINE mouth rinse  15 mL Mouth Rinse q12n4p Brand Males, MD      . rosuvastatin (CRESTOR) tablet 10 mg  10 mg Oral q1800 Sampson Goon, MD   10 mg at 06/17/17 1717  . vitamin C (ASCORBIC ACID) tablet 500 mg  500 mg Oral Daily Sampson Goon, MD   500 mg at 06/18/17 1388     Discharge Medications: Please see discharge summary for a list of discharge medications.  Relevant Imaging Results:  Relevant Lab Results:   Additional Information SS#: [id number]  Geralynn Ochs, LCSW

## 2017-06-18 NOTE — Progress Notes (Addendum)
Report received, agree with assessment as documented.

## 2017-06-18 NOTE — Progress Notes (Signed)
Pharmacy Antibiotic Note  Daniel [name] is a 84 y.o. male admitted on 06/16/2017 with seizure.  Pharmacy has been consulted 3/29  For Unasyn dosing for aspiration pneumoia.  Plan: Unasyn 3 g m IV q8h Monitor clinical status, renal function, culture results.   Weight: 193 lb 5.5 oz (87.7 kg)  Temp (24hrs), Avg:98.2 F (36.8 C), Min:97.8 F (36.6 C), Max:98.9 F (37.2 C)  Recent Labs  Lab 06/16/17 1251 06/16/17 1257 06/17/17 0359 06/18/17 0759  WBC 17.6*  --  13.2*  --   CREATININE 1.48* 1.40* 1.51* 1.59*    Estimated Creatinine Clearance: 39.2 mL/min (A) (by C-G formula based on SCr of 1.59 mg/dL (H)).    No Known Allergies  Antimicrobials this admission: Zosyn 3/27>>3/28; resumed 3/28>>3/29 *CXR more atelectasis than PNA Unasyn 3/29>   Dose adjustments this admission: n/a  Microbiology results: 3/27 MRSA PCR: neg 3/27 resp cx: multiple species 3/27 Urine cx: neg   Thank you for allowing pharmacy to be a part of this patient's care.  Nicole Cella, RPh Clinical Pharmacist Pager: [phone]-4P [phone]-10P [phone] [place] [phone] 06/18/2017 9:01 AM

## 2017-06-18 NOTE — Progress Notes (Signed)
Pt continues to refuse cardiac monitor placement. Removes leads and device immediatley upon application. CCMD aware.

## 2017-06-18 NOTE — Care Management Note (Signed)
Case Management Note  Patient Details  Name: Daniel [name] MRN: [id number] Date of Birth: December 03, 1933  Subjective/Objective:     Pt admitted with seizure. He is from home with spouse.                Action/Plan: PT recommending SNF. CM following for d/c disposition.   Expected Discharge Date:                  Expected Discharge Plan:  Skilled Nursing Facility  In-House Referral:  Clinical Social Work  Discharge planning Services     Post Acute Care Choice:    Choice offered to:     DME Arranged:    DME Agency:     HH Arranged:    [place] Agency:     Status of Service:  In process, will continue to follow  If discussed at Long Length of Stay Meetings, dates discussed:    Additional Comments:  Pollie Friar, RN 06/18/2017, 11:37 AM

## 2017-06-18 NOTE — Progress Notes (Signed)
  Echocardiogram 2D Echocardiogram has been performed.  Merrie Roof F 06/18/2017, 2:54 PM

## 2017-06-18 NOTE — Progress Notes (Signed)
PT Cancellation Note  Patient Details Name: Daniel [name] MRN: [id number] DOB: September 07, 1933   Cancelled Treatment:    Reason Eval/Treat Not Completed: (P) Patient declined, no reason specified Pt refused to participate in any form of therapy today despite max encouragement. PT will try back tomorrow.  Seab Axel B. Migdalia Dk PT, DPT Acute Rehabilitation  [phone] Pager [phone]     [place] 06/18/2017, 1:18 PM

## 2017-06-18 NOTE — Progress Notes (Signed)
CCMD called to notify that patient's leads were off. This nurse went into room and found that pt had removed them. Advised him that his doctor had given an order for him to be on telemetry so we could monitor his heart rhythm and rate, and asked him if we could put the leads back on. Pt refused. Pt also refused to allow this nurse to retrieve telemetry box from beneath the bed covers.

## 2017-06-18 NOTE — Clinical Social Work Note (Signed)
Clinical Social Work Assessment  Patient Details  Name: Daniel [name] MRN: [id number] Date of Birth: 1933/12/18  Date of referral:  06/18/17               Reason for consult:  Facility Placement                Permission sought to share information with:  Facility Sport and exercise psychologist, Family Supports Permission granted to share information::  Yes, Verbal Permission Granted  Name::     Saunders Glance  Agency::  SNF  Relationship::  Wife, son  Contact Information:     Housing/Transportation Living arrangements for the past 2 months:  Single Family Home Source of Information:  Spouse, Adult Children Patient Interpreter Needed:  None Criminal Activity/Legal Involvement Pertinent to Current Situation/Hospitalization:  No - Comment as needed Significant Relationships:  Spouse, Adult Children Lives with:  Self, Spouse Do you feel safe going back to the place where you live?  Yes Need for family participation in patient care:  Yes (Comment)(patient not oriented)  Care giving concerns:  Patient lives at home with spouse but will benefit from short term SNF at discharge for rehab.   Social Worker assessment / plan:  CSW met with patient's wife and patient's son at bedside to discuss recommendation for SNF. CSW discussed referral process and facilities that contract with patient's insurance. CSW to follow up with faxing out referral.  Employment status:  Retired Nurse, adult PT Recommendations:  [place] / Referral to community resources:  [place]  Patient/Family's Response to care:  Patient's wife agreeable to SNF placement.  Patient/Family's Understanding of and Emotional Response to Diagnosis, Current Treatment, and Prognosis:  Patient's wife discussed frustration in how no one had told her that the patient had moved from ICU and she hadn't been able to speak with a doctor yet. Patient's wife discussed knowing  that patient will need rehab. Patient had been able to walk around the house with a walker prior to admission and going out in the community in his wheelchair. Patient's wife would like him back to his baseline before bringing him home, if possible.  Emotional Assessment Appearance:  Appears stated age Attitude/Demeanor/Rapport:  Unable to Assess Affect (typically observed):  Unable to Assess Orientation:  Oriented to Self, Oriented to Place Alcohol / Substance use:  Not Applicable Psych involvement (Current and /or in the community):  No (Comment)  Discharge Needs  Concerns to be addressed:  Care Coordination Readmission within the last 30 days:  No Current discharge risk:  Dependent with Mobility Barriers to Discharge:  Continued Medical Work up, [place], [place] 06/18/2017, 5:08 PM

## 2017-06-18 NOTE — Plan of Care (Signed)
  Problem: Clinical Measurements: Goal: Ability to maintain clinical measurements within normal limits will improve Outcome: Progressing   Problem: Pain Managment: Goal: General experience of comfort will improve Outcome: Progressing   

## 2017-06-19 ENCOUNTER — Inpatient Hospital Stay (HOSPITAL_COMMUNITY): Payer: Medicare HMO

## 2017-06-19 DIAGNOSIS — I509 Heart failure, unspecified: Secondary | ICD-10-CM

## 2017-06-19 DIAGNOSIS — N183 Chronic kidney disease, stage 3 (moderate): Secondary | ICD-10-CM | POA: Diagnosis not present

## 2017-06-19 DIAGNOSIS — I10 Essential (primary) hypertension: Secondary | ICD-10-CM | POA: Diagnosis not present

## 2017-06-19 DIAGNOSIS — R569 Unspecified convulsions: Secondary | ICD-10-CM | POA: Diagnosis not present

## 2017-06-19 DIAGNOSIS — R4182 Altered mental status, unspecified: Secondary | ICD-10-CM | POA: Diagnosis not present

## 2017-06-19 DIAGNOSIS — E1122 Type 2 diabetes mellitus with diabetic chronic kidney disease: Secondary | ICD-10-CM | POA: Diagnosis not present

## 2017-06-19 DIAGNOSIS — I2510 Atherosclerotic heart disease of native coronary artery without angina pectoris: Secondary | ICD-10-CM | POA: Diagnosis not present

## 2017-06-19 DIAGNOSIS — I5022 Chronic systolic (congestive) heart failure: Secondary | ICD-10-CM | POA: Diagnosis not present

## 2017-06-19 DIAGNOSIS — R410 Disorientation, unspecified: Secondary | ICD-10-CM

## 2017-06-19 DIAGNOSIS — I2583 Coronary atherosclerosis due to lipid rich plaque: Secondary | ICD-10-CM | POA: Diagnosis not present

## 2017-06-19 LAB — CBC WITH DIFFERENTIAL/PLATELET
Basophils Absolute: 0 10*3/uL (ref 0.0–0.1)
Basophils Relative: 0 %
EOS ABS: 0.3 10*3/uL (ref 0.0–0.7)
Eosinophils Relative: 3 %
HEMATOCRIT: 31.7 % — AB (ref 39.0–52.0)
HEMOGLOBIN: 10.6 g/dL — AB (ref 13.0–17.0)
LYMPHS ABS: 2.5 10*3/uL (ref 0.7–4.0)
LYMPHS PCT: 23 %
MCH: 31.2 pg (ref 26.0–34.0)
MCHC: 33.4 g/dL (ref 30.0–36.0)
MCV: 93.2 fL (ref 78.0–100.0)
Monocytes Absolute: 1.2 10*3/uL — ABNORMAL HIGH (ref 0.1–1.0)
Monocytes Relative: 11 %
NEUTROS ABS: 6.9 10*3/uL (ref 1.7–7.7)
NEUTROS PCT: 63 %
Platelets: 126 10*3/uL — ABNORMAL LOW (ref 150–400)
RBC: 3.4 MIL/uL — AB (ref 4.22–5.81)
RDW: 12.1 % (ref 11.5–15.5)
WBC: 10.9 10*3/uL — AB (ref 4.0–10.5)

## 2017-06-19 LAB — CULTURE, RESPIRATORY W GRAM STAIN: Culture: NORMAL

## 2017-06-19 LAB — GLUCOSE, CAPILLARY
GLUCOSE-CAPILLARY: 147 mg/dL — AB (ref 65–99)
GLUCOSE-CAPILLARY: 155 mg/dL — AB (ref 65–99)
GLUCOSE-CAPILLARY: 124 mg/dL — AB (ref 65–99)
Glucose-Capillary: 140 mg/dL — ABNORMAL HIGH (ref 65–99)

## 2017-06-19 LAB — MAGNESIUM: MAGNESIUM: 1.6 mg/dL — AB (ref 1.7–2.4)

## 2017-06-19 LAB — PROCALCITONIN: Procalcitonin: <0.1 ng/mL

## 2017-06-19 LAB — PHOSPHORUS: Phosphorus: 3 mg/dL (ref 2.5–4.6)

## 2017-06-19 LAB — CULTURE, RESPIRATORY: SPECIAL REQUESTS: NORMAL

## 2017-06-19 MED ORDER — PERFLUTREN LIPID MICROSPHERE
1.0000 mL | INTRAVENOUS | Status: AC | PRN
  Administered 2017-06-19: 2 mL via INTRAVENOUS
  Filled 2017-06-19: qty 10

## 2017-06-19 MED ORDER — LEVETIRACETAM 100 MG/ML PO SOLN: 750.0000 mg | Freq: Two times a day (BID) | ORAL | 12 refills | Status: DC

## 2017-06-19 MED ORDER — INSULIN ASPART 100 UNIT/ML ~~LOC~~ SOLN: 0.0000 [IU] | Freq: Three times a day (TID) | SUBCUTANEOUS | 11 refills | Status: DC

## 2017-06-19 MED ORDER — AMOXICILLIN-POT CLAVULANATE 875-125 MG PO TABS: 1.0000 | ORAL_TABLET | Freq: Two times a day (BID) | ORAL | 0 refills | Status: DC

## 2017-06-19 NOTE — Progress Notes (Signed)
  Echocardiogram 2D Echocardiogram has been performed.  Daniel [name] F 06/19/2017, 12:17 PM

## 2017-06-19 NOTE — Progress Notes (Signed)
Pt refusing all interventions during PM shift.  Not allowing staff to check cbg, refusing vitals.  Pt confused to place, time, and situation.

## 2017-06-19 NOTE — Progress Notes (Signed)
PROGRESS NOTE    Daniel [name]  SJG:[id number] DOB: 04-Sep-1933 DOA: 06/16/2017 PCP: Daniel [name], Daniel [name]      Brief Narrative:  Daniel [name] is a 84 yo M with DM, CHF prev 35% now 55% and CAD who presented with acute manual clumsiness followed by right-sided weakness and slurred speech.  His wife had taken his blood sugar earlier and it was 112.  EMS was called and he had a generalized seizure in route he has been intubated and is now mechanically ventilated.  He was worked up as a code stroke with a negative head CT and an MRI that does not show any acute infarct there is a small area of hemorrhage near the left vertex.  CTA showed no large vessel occlusion there is bilateral carotid bifurcation stenosis and the vertebrals are stenotic at their origins as well.  He has had one seizure in his life previously at a time where he was apparently unstable and had 2 coronary stents placed.  He has had no further seizure activity since that time.     Assessment & Plan:  Seizure EEG showed no epileptiform activity, but abnormal focus.   Has history of lawnmower accident "6 years ago" rolled a riding mower, has scar on right temple.  -Continue Keppra  Aspiration pneumonia Procalcitonin <0.1 initially, repeat pending. -Narrow antibiotics, 2 more days of Augmentin -Follow sputum culture  Coronary disease Hypertension Elevated troponin Troponin elevation low and flat, echocardiogram shows reduced EF again, regional wall motion abnormality initially on interpretation.  His last echo was two years ago and showed recovered EF (prior ot htat his last echo was in 2004 or 2005 when he had his NSTEMI and stents to LAD and RCA in setting of his first seizure) -Hold losartan given low blood pressure -Continue aspirin, statin  - Obtain limited echocardiogram with [facility] to evaluate regional wall motion abnormality, will discuss with cardiology further workup while in the hospital  Diabetes -Hold home  Tresiba -Continue SSI  Chronic kidney disease stage III Baseline 1.4  Other medications -Continue eye drops -Continue finasteride -Continue SSRI        DVT prophylaxis: Change to Lovenox Code Status: DO NOT RESUSCITATE Family Communication: None present MDM and disposition Plan: Below labs and imaging reports were reviewed.  Echocardiogram report was reviewed and new echo was ordered after discussion with cardiology.  He has new reduced EF, further workup is planned.    He initially came in with seizure, requiring intubation for respiratory support.  He is now stable and on new Keppra.  PT eval and then disposition pending, may need insurance approval.     Consultants:   Neurology  CCM  Procedures:   Intubated 3/27   CXR 3/27 IMPRESSION: Tube positions as described without pneumothorax. Left base consolidation consistent with pneumonia. Small left pleural effusion. Mild right base atelectasis.  Stable cardiomegaly.  Aortic atherosclerosis evident.  Aortic Atherosclerosis (ICD10-I70.0).    CTA head and neck IMPRESSION: 1. No emergent large vessel occlusion. 2. High-grade stenosis at the carotid bifurcations bilaterally. 3. Moderate stenoses within the cavernous internal carotid arteries bilaterally. 4. High-grade stenosis at the origin of the vertebral arteries bilaterally. 5. Fetal type left posterior cerebral artery. 6. Normal variant circle of Willis without significant proximal stenosis, aneurysm, or branch vessel occlusion above the supraclinoid segments. 7. Multilevel spondylosis of the cervical spine as described. Foraminal disease is greatest at C3-4 and C6-7. 8. The endotracheal tube terminates only 2.5 cm above the carina and  could be pulled back 1-2 cm for more optimal positioning. 9.  Aortic Atherosclerosis (ICD10-I70.0).    MRI brain 3./27 IMPRESSION: Atrophy and small vessel disease. No acute intracranial findings. No acute  stroke.    Extubated 3/28  Echocardiogram 3/29 Study Conclusions  - Left ventricle: The cavity size was normal. Wall thickness was   increased in a pattern of mild LVH. Systolic function was mildly   reduced. The estimated ejection fraction was in the range of 45%   to 50%. Basal to mid inferior wall hypokinesis. The study is not   technically sufficient to allow evaluation of LV diastolic   function. Ejection fraction (MOD, 2-plane): 50%. - Aortic valve: Sclerosis without stenosis. There was mild   regurgitation. - Mitral valve: Calcified annulus. Mildly thickened leaflets .   Moderate regurgitation. - Left atrium: Severely dilated. - Tricuspid valve: There was mild regurgitation. - Pulmonary arteries: PA peak pressure: 57 mm Hg (S). - Inferior vena cava: The vessel was dilated. The respirophasic   diameter changes were blunted (< 50%), consistent with elevated   central venous pressure.  Impressions:  - Compared to a prior study in 2016, the LVEF is reduced to around   45-50% with basal to mid inferior wall hypokinesis.        Antimicrobials:   Zosyn 3/27 >> 3/28  Unasyn 3/28 >> 3/29  Augmentin 3/29 >>   Subjective: Oriuented, tired.  Wants to get out of the hospital.  No chest pain, no dyuspnea, no leg swelling.  Some delirium overnight.  No fever, cough, sputum.  Objective: Vitals:   06/19/17 0539 06/19/17 0920 06/19/17 1200 06/19/17 1701  BP: 121/66 137/63 (!) 116/54 (!) 119/53  Pulse: 82 79 75 70  Resp: 18 20 20 16   Temp: 98.9 F (37.2 C) 98.3 F (36.8 C) 97.6 F (36.4 C) 98 F (36.7 C)  TempSrc: Oral Oral Oral Oral  SpO2: 93% 95% 97% 96%  Weight:        Intake/Output Summary (Last 24 hours) at 06/19/2017 1804 Last data filed at 06/19/2017 1706 Gross per 24 hour  Intake -  Output 2000 ml  Net -2000 ml   Filed Weights   06/18/17 0100 06/18/17 0400 06/19/17 0500  Weight: 87.5 kg (192 lb 14.4 oz) 87.7 kg (193 lb 5.5 oz) 87.6 kg (193 lb 2  oz)    Examination: General appearance: Elderly male, drifts off to sleep, otherwise no acute distress. HEENT: Eyes anicteric, conjunctivae and lashes normal, nose without deformity, oral mucosa moist without lesions.  Normal. Skin: Warm and dry, no suspicious rashes lesions, no jaundice. Cardiac: Rate and rhythm, no murmurs appreciated.  No lower extremity edema. Respiratory: Normal respiratory rate, lung sounds clear. Abdomen: Abdomen soft, no tenderness to palpation or guarding. MSK: No deformities or effusions. Neuro: Awake and alert, cranial nerves normal, sensation intact light touch on the face, arms, legs.   Psych: Interactive, oriented to person place and time, remembers the basketball games last night, judgment and insight appear normal, affect flat.  Data Reviewed: I have personally reviewed following labs and imaging studies:  CBC: Recent Labs  Lab 06/16/17 1251 06/16/17 1257 06/17/17 0359 06/18/17 0759 06/19/17 0609  WBC 17.6*  --  13.2* 11.5* 10.9*  NEUTROABS 5.3  --  8.8* 6.9 6.9  HGB 13.8 13.6 12.1* 10.7* 10.6*  HCT 41.1 40.0 35.6* 31.8* 31.7*  MCV 96.7  --  92.7 93.8 93.2  PLT 156  --  129* 115* 270*   Basic Metabolic  Panel: Recent Labs  Lab 06/16/17 1251 06/16/17 1257 06/17/17 0359 06/18/17 0759 06/19/17 0609  NA 137 139 135 134*  --   K 4.6 4.6 4.8 4.1  --   CL 108 109 105 106  --   CO2 13*  --  19* 21*  --   GLUCOSE 138* 130* 111* 104*  --   BUN 35* 36* 33* 36*  --   CREATININE 1.48* 1.40* 1.51* 1.59*  --   CALCIUM 9.2  --  8.5* 8.4*  --   MG  --   --  1.9 1.9 1.6*  PHOS  --   --   --  3.3 3.0   GFR: Estimated Creatinine Clearance: 39.2 mL/min (A) (by C-G formula based on SCr of 1.59 mg/dL (H)). Liver Function Tests: Recent Labs  Lab 06/16/17 1251 06/17/17 0359  AST 34 36  ALT 21 17  ALKPHOS 55 43  BILITOT 0.9 1.1  PROT 7.1 6.0*  ALBUMIN 3.7 3.1*   No results for input(s): LIPASE, AMYLASE in the last 168 hours. No results for  input(s): AMMONIA in the last 168 hours. Coagulation Profile: Recent Labs  Lab 06/16/17 1251  INR 1.19   Cardiac Enzymes: Recent Labs  Lab 06/16/17 1702 06/16/17 2328 06/17/17 0359  TROPONINI 0.06* 0.09* 0.10*   BNP (last 3 results) No results for input(s): PROBNP in the last 8760 hours. HbA1C: No results for input(s): HGBA1C in the last 72 hours. CBG: Recent Labs  Lab 06/18/17 1206 06/18/17 1623 06/19/17 0620 06/19/17 1120 06/19/17 1659  GLUCAP 112* 147* 147* 140* 155*   Lipid Profile: No results for input(s): CHOL, HDL, LDLCALC, TRIG, CHOLHDL, LDLDIRECT in the last 72 hours. Thyroid Function Tests: No results for input(s): TSH, T4TOTAL, FREET4, T3FREE, THYROIDAB in the last 72 hours. Anemia Panel: No results for input(s): VITAMINB12, FOLATE, FERRITIN, TIBC, IRON, RETICCTPCT in the last 72 hours. Urine analysis:    Component Value Date/Time   COLORURINE YELLOW 06/17/2017 0053   APPEARANCEUR CLEAR 06/17/2017 0053   LABSPEC 1.027 06/17/2017 0053   PHURINE 6.0 06/17/2017 0053   GLUCOSEU NEGATIVE 06/17/2017 0053   HGBUR NEGATIVE 06/17/2017 0053   BILIRUBINUR NEGATIVE 06/17/2017 0053   KETONESUR NEGATIVE 06/17/2017 0053   PROTEINUR 100 (A) 06/17/2017 0053   UROBILINOGEN 1.0 04/17/2013 1014   NITRITE NEGATIVE 06/17/2017 0053   LEUKOCYTESUR NEGATIVE 06/17/2017 0053   Sepsis Labs: @LABRCNTIP (procalcitonin:4,lacticacidven:4)  ) Recent Results (from the past 240 hour(s))  MRSA PCR Screening     Status: None   Collection Time: 06/16/17  7:25 PM  Result Value Ref Range Status   MRSA by PCR NEGATIVE NEGATIVE Final    Comment:        The GeneXpert MRSA Assay (FDA approved for NASAL specimens only), is one component of a comprehensive MRSA colonization surveillance program. It is not intended to diagnose MRSA infection nor to guide or monitor treatment for MRSA infections. Performed at [facility], [place] [address] [place], [place] [address]   Culture,  respiratory (NON-Expectorated)     Status: None   Collection Time: 06/16/17 10:07 PM  Result Value Ref Range Status   Specimen Description TRACHEAL ASPIRATE  Final   Special Requests Normal  Final   Gram Stain   Final    FEW WBC PRESENT, PREDOMINANTLY PMN MODERATE GRAM POSITIVE COCCI IN PAIRS FEW GRAM NEGATIVE RODS FEW GRAM NEGATIVE DIPLOCOCCI RARE GRAM POSITIVE RODS    Culture   Final    FEW Consistent with normal  respiratory flora. Performed at [facility], [place] [address], [place], [place] [address]    Report Status 06/19/2017 FINAL  Final  Culture, Urine     Status: None   Collection Time: 06/17/17 12:52 AM  Result Value Ref Range Status   Specimen Description URINE, CLEAN CATCH  Final   Special Requests Normal  Final   Culture   Final    NO GROWTH Performed at [facility], [place] [address] [place], [place] [address]    Report Status 06/18/2017 FINAL  Final         Radiology Studies: No results found.      Scheduled Meds: . amoxicillin-clavulanate  1 tablet Oral Q12H  . aspirin  81 mg Oral Daily  . chlorhexidine  15 mL Mouth Rinse BID  . citalopram  10 mg Oral Daily  . enoxaparin (LOVENOX) injection  40 mg Subcutaneous Q24H  . finasteride  5 mg Oral Daily  . insulin aspart  0-9 Units Subcutaneous TID WC  . latanoprost  1 drop Both Eyes QHS  . levETIRAcetam  750 mg Oral BID  . mouth rinse  15 mL Mouth Rinse q12n4p  . rosuvastatin  10 mg Oral q1800  . vitamin C  500 mg Oral Daily   Continuous Infusions:    LOS: 3 days    Time spent: 35 minutes    Daniel Dada, Daniel [name] [facility] 06/19/2017, 6:04 PM     Pager [phone] --- please page though AMION:  [email or web address] Password [email or web address] If 7PM-7AM, please contact night-coverage

## 2017-06-20 DIAGNOSIS — E1122 Type 2 diabetes mellitus with diabetic chronic kidney disease: Secondary | ICD-10-CM | POA: Diagnosis not present

## 2017-06-20 DIAGNOSIS — I2510 Atherosclerotic heart disease of native coronary artery without angina pectoris: Secondary | ICD-10-CM | POA: Diagnosis not present

## 2017-06-20 DIAGNOSIS — R748 Abnormal levels of other serum enzymes: Secondary | ICD-10-CM

## 2017-06-20 DIAGNOSIS — I2583 Coronary atherosclerosis due to lipid rich plaque: Secondary | ICD-10-CM | POA: Diagnosis not present

## 2017-06-20 DIAGNOSIS — R410 Disorientation, unspecified: Secondary | ICD-10-CM | POA: Diagnosis not present

## 2017-06-20 DIAGNOSIS — N183 Chronic kidney disease, stage 3 (moderate): Secondary | ICD-10-CM | POA: Diagnosis not present

## 2017-06-20 DIAGNOSIS — I10 Essential (primary) hypertension: Secondary | ICD-10-CM | POA: Diagnosis not present

## 2017-06-20 DIAGNOSIS — R4182 Altered mental status, unspecified: Secondary | ICD-10-CM | POA: Diagnosis not present

## 2017-06-20 DIAGNOSIS — R569 Unspecified convulsions: Secondary | ICD-10-CM

## 2017-06-20 DIAGNOSIS — I5022 Chronic systolic (congestive) heart failure: Secondary | ICD-10-CM | POA: Diagnosis not present

## 2017-06-20 LAB — MAGNESIUM: Magnesium: 1.5 mg/dL — ABNORMAL LOW (ref 1.7–2.4)

## 2017-06-20 LAB — GLUCOSE, CAPILLARY
GLUCOSE-CAPILLARY: 116 mg/dL — AB (ref 65–99)
Glucose-Capillary: 151 mg/dL — ABNORMAL HIGH (ref 65–99)
Glucose-Capillary: 126 mg/dL — ABNORMAL HIGH (ref 65–99)
Glucose-Capillary: 159 mg/dL — ABNORMAL HIGH (ref 65–99)

## 2017-06-20 LAB — CBC WITH DIFFERENTIAL/PLATELET
Basophils Absolute: 0 10*3/uL (ref 0.0–0.1)
Basophils Relative: 0 %
Eosinophils Absolute: 0.5 10*3/uL (ref 0.0–0.7)
Eosinophils Relative: 4 %
HCT: 32.2 % — ABNORMAL LOW (ref 39.0–52.0)
HEMOGLOBIN: 11.1 g/dL — AB (ref 13.0–17.0)
LYMPHS ABS: 3 10*3/uL (ref 0.7–4.0)
LYMPHS PCT: 27 %
MCH: 31.8 pg (ref 26.0–34.0)
MCHC: 34.5 g/dL (ref 30.0–36.0)
MCV: 92.3 fL (ref 78.0–100.0)
MONOS PCT: 11 %
Monocytes Absolute: 1.3 10*3/uL — ABNORMAL HIGH (ref 0.1–1.0)
Neutro Abs: 6.4 10*3/uL (ref 1.7–7.7)
Neutrophils Relative %: 58 %
Platelets: 137 10*3/uL — ABNORMAL LOW (ref 150–400)
RBC: 3.49 MIL/uL — ABNORMAL LOW (ref 4.22–5.81)
RDW: 12.1 % (ref 11.5–15.5)
WBC: 11.2 10*3/uL — AB (ref 4.0–10.5)

## 2017-06-20 LAB — PHOSPHORUS: PHOSPHORUS: 2.8 mg/dL (ref 2.5–4.6)

## 2017-06-20 LAB — ECHOCARDIOGRAM LIMITED: Weight: 3089.97 oz

## 2017-06-20 MED ORDER — MAGNESIUM CHLORIDE 64 MG PO TBEC
1.0000 | DELAYED_RELEASE_TABLET | Freq: Two times a day (BID) | ORAL | Status: AC
  Administered 2017-06-20 – 2017-06-21 (×3): 64 mg via ORAL
  Filled 2017-06-20 (×3): qty 1

## 2017-06-20 NOTE — Consult Note (Signed)
CONSULTATION NOTE   Patient Name: Daniel [name] Date of Encounter: 06/20/2017 Cardiologist: Sanda Klein, MD  Chief Complaint   Elevated troponin  Patient Profile   84 yo male with history of CAD and prior PCI to the LAD and RCA, CHF with LVEF of 20-30% in the past, now improved to 45-50%, presented with seizure and found to have elevated troponin.  HPI   Daniel [name] is a 84 y.o. male who is being seen today for the evaluation of elevated troponin at the request of Dr. Loleta Books. This is a 84 yo male patient followed by Dr. Sallyanne Kuster, but not seen since 2016.  He was seen in the office to reestablish follow-up care.  He is a former patient of Dr. Rex Kras, but had not been seen up to 5 years prior to that appointment.  According to the records his cardiac problems date to 2004 when he was hospitalized at [facility] in [place] for acute MI and cardiogenic shock.  At the time he received 2 Cypher stents to the LAD and RCA.  LVEF was noted to be 20-30%.  He is not had any recurrent procedures or MI up until that point that has been known.  He is also reported to have a history of significant alcohol use.  Repeat echocardiogram was performed that showed an LVEF of 55-60% in 2016.  He also has a history of diabetes and developed right-sided weakness and slurred speech.  EMS was called and he was noted to have a generalized seizure in route.  He was intubated and transferred to critical care.  CTA showed no large vessel occlusion.  Seen by neurology and started on Keppra.  Lab work indicated very mild elevation of troponin of 0.06, 0.09 and 0.10.  He has denied any chest pain.  Echocardiogram performed on 06/18/2017, personally reviewed showed LVEF around 45-50% with basal to mid inferior wall hypokinesis, suggestive of prior infarct.  A repeat echocardiogram with contrast was ordered and I personally reviewed that today.  This demonstrates improvement in LVEF to 60-65% with no regional  wall motion abnormalities.  Endocardial definition is improved.  PMHx   Past Medical History:  Diagnosis Date  . Alcohol abuse   . [facility])    prostate  . Cardiomyopathy ([place])   . CHF (congestive heart failure) ([place])   . Complication of anesthesia    Hx of seizure when put to sleep for surgery on 08/2009  . Constipation   . Coronary artery disease   . Diabetes mellitus without complication ([place])   . H/O hiatal hernia   . Hyperlipidemia   . Hypertension   . Peripheral neuropathy   . Seizures ([place])   . Urinary incontinence     Past Surgical History:  Procedure Laterality Date  . CATARACT EXTRACTION, BILATERAL    . CORONARY ANGIOPLASTY     stents x 2  . CORONARY STENT PLACEMENT     2004  . PROSTATE SURGERY  2008   SEED IMPLANTS  . RADIOACTIVE SEED IMPLANT     prostate  . TONSILLECTOMY     1940's    FAMHx   Family History  Problem Relation Age of Onset  . Diabetes Mother   . Heart disease Father   . Heart disease Brother   . Diabetes Brother     SOCHx    reports that he has never smoked. He has never used smokeless tobacco. He reports that he drinks alcohol. He reports that he does  not use drugs.  Outpatient Medications   No current facility-administered medications on file prior to encounter.    Current Outpatient Medications on File Prior to Encounter  Medication Sig Dispense Refill  . aspirin 81 MG tablet Take 81 mg by mouth daily.    . B Complex-C (SUPER B COMPLEX PO) Take 1 tablet by mouth daily.    . citalopram (CELEXA) 10 MG tablet Take 10 mg by mouth daily.     . finasteride (PROSCAR) 5 MG tablet Take 5 mg by mouth daily.     . hydrocortisone 2.5 % cream Apply 1 application topically as needed (face).   0  . insulin aspart (NOVOLOG FLEXPEN) 100 UNIT/ML FlexPen Inject 10-25 Units into the skin See admin instructions. Inject 10 units subcutaneously every morning and 25 units at night    . Insulin Degludec (TRESIBA FLEXTOUCH) 200 UNIT/ML SOPN  Inject 30 Units into the skin at bedtime.    Marland Kitchen latanoprost (XALATAN) 0.005 % ophthalmic solution Place 1 drop into both eyes at bedtime.    Marland Kitchen losartan (COZAAR) 50 MG tablet Take 50 mg by mouth daily.   0  . Multiple Vitamin (MULTIVITAMIN) tablet Take 1 tablet by mouth daily.    . rosuvastatin (CRESTOR) 20 MG tablet Take 10 mg by mouth daily.     . vitamin C (ASCORBIC ACID) 500 MG tablet Take 500 mg by mouth daily.      Inpatient Medications    Scheduled Meds: . amoxicillin-clavulanate  1 tablet Oral Q12H  . aspirin  81 mg Oral Daily  . chlorhexidine  15 mL Mouth Rinse BID  . citalopram  10 mg Oral Daily  . finasteride  5 mg Oral Daily  . insulin aspart  0-9 Units Subcutaneous TID WC  . latanoprost  1 drop Both Eyes QHS  . levETIRAcetam  750 mg Oral BID  . magnesium chloride  1 tablet Oral BID  . mouth rinse  15 mL Mouth Rinse q12n4p  . rosuvastatin  10 mg Oral q1800  . vitamin C  500 mg Oral Daily    Continuous Infusions:   PRN Meds:    ALLERGIES   No Known Allergies  ROS   Pertinent items noted in HPI and remainder of comprehensive ROS otherwise negative.  Vitals   Vitals:   06/20/17 0002 06/20/17 0404 06/20/17 0830 06/20/17 1218  BP: (!) 116/49 (!) 132/56 (!) 124/54 (!) 131/55  Pulse: 77 77 76 76  Resp: 18 18 16 16   Temp: 98.1 F (36.7 C) 98.3 F (36.8 C) 98.9 F (37.2 C) 98.5 F (36.9 C)  TempSrc: Oral Oral Oral Oral  SpO2: 96% 96% 100% 98%  Weight:  196 lb 10.4 oz (89.2 kg)      Intake/Output Summary (Last 24 hours) at 06/20/2017 1306 Last data filed at 06/19/2017 2300 Gross per 24 hour  Intake 100 ml  Output 300 ml  Net -200 ml   Filed Weights   06/18/17 0400 06/19/17 0500 06/20/17 0404  Weight: 193 lb 5.5 oz (87.7 kg) 193 lb 2 oz (87.6 kg) 196 lb 10.4 oz (89.2 kg)    Physical Exam   General appearance: alert and no distress Neck: no carotid bruit, no JVD and thyroid not enlarged, symmetric, no tenderness/mass/nodules Lungs: clear to  auscultation bilaterally Heart: regular rate and rhythm Abdomen: soft, non-tender; bowel sounds normal; no masses,  no organomegaly Extremities: extremities normal, atraumatic, no cyanosis or edema Pulses: 2+ and symmetric Skin: Skin color, texture, turgor normal. No  rashes or lesions Neurologic: Grossly normal psych: Pleasant  Labs   Results for orders placed or performed during the hospital encounter of 06/16/17 (from the past 48 hour(s))  Glucose, capillary     Status: Abnormal   Collection Time: 06/18/17  4:23 PM  Result Value Ref Range   Glucose-Capillary 147 (H) 65 - 99 mg/dL   Comment 1 Notify RN    Comment 2 Document in Chart   CBC with Differential/Platelet     Status: Abnormal   Collection Time: 06/19/17  6:09 AM  Result Value Ref Range   WBC 10.9 (H) 4.0 - 10.5 K/uL   RBC 3.40 (L) 4.22 - 5.81 MIL/uL   Hemoglobin 10.6 (L) 13.0 - 17.0 g/dL   HCT 31.7 (L) 39.0 - 52.0 %   MCV 93.2 78.0 - 100.0 fL   MCH 31.2 26.0 - 34.0 pg   MCHC 33.4 30.0 - 36.0 g/dL   RDW 12.1 11.5 - 15.5 %   Platelets 126 (L) 150 - 400 K/uL   Neutrophils Relative % 63 %   Neutro Abs 6.9 1.7 - 7.7 K/uL   Lymphocytes Relative 23 %   Lymphs Abs 2.5 0.7 - 4.0 K/uL   Monocytes Relative 11 %   Monocytes Absolute 1.2 (H) 0.1 - 1.0 K/uL   Eosinophils Relative 3 %   Eosinophils Absolute 0.3 0.0 - 0.7 K/uL   Basophils Relative 0 %   Basophils Absolute 0.0 0.0 - 0.1 K/uL    Comment: Performed at [facility], [address], [place], [place] [address]  Magnesium     Status: Abnormal   Collection Time: 06/19/17  6:09 AM  Result Value Ref Range   Magnesium 1.6 (L) 1.7 - 2.4 mg/dL    Comment: Performed at [facility] [address], [place], [place] [address]  Phosphorus     Status: None   Collection Time: 06/19/17  6:09 AM  Result Value Ref Range   Phosphorus 3.0 2.5 - 4.6 mg/dL    Comment: Performed at [facility] [address] [place], [place] [address]  Procalcitonin      Status: None   Collection Time: 06/19/17  6:09 AM  Result Value Ref Range   Procalcitonin <0.10 ng/mL    Comment:        Interpretation: PCT (Procalcitonin) <= 0.5 ng/mL: Systemic infection (sepsis) is not likely. Local bacterial infection is possible. (NOTE)       Sepsis PCT Algorithm           Lower Respiratory Tract                                      Infection PCT Algorithm    ----------------------------     ----------------------------         PCT < 0.25 ng/mL                PCT < 0.10 ng/mL         Strongly encourage             Strongly discourage   discontinuation of antibiotics    initiation of antibiotics    ----------------------------     -----------------------------       PCT 0.25 - 0.50 ng/mL            PCT 0.10 - 0.25 ng/mL  OR       >80% decrease in PCT            Discourage initiation of                                            antibiotics      Encourage discontinuation           of antibiotics    ----------------------------     -----------------------------         PCT >= 0.50 ng/mL              PCT 0.26 - 0.50 ng/mL               AND        <80% decrease in PCT             Encourage initiation of                                             antibiotics       Encourage continuation           of antibiotics    ----------------------------     -----------------------------        PCT >= 0.50 ng/mL                  PCT > 0.50 ng/mL               AND         increase in PCT                  Strongly encourage                                      initiation of antibiotics    Strongly encourage escalation           of antibiotics                                     -----------------------------                                           PCT <= 0.25 ng/mL                                                 OR                                        > 80% decrease in PCT                                     Discontinue / Do not initiate  antibiotics Performed at [facility], [place] [address] [place], [place] [address]   Glucose, capillary     Status: Abnormal   Collection Time: 06/19/17  6:20 AM  Result Value Ref Range   Glucose-Capillary 147 (H) 65 - 99 mg/dL   Comment 1 Notify RN    Comment 2 Document in Chart   Glucose, capillary     Status: Abnormal   Collection Time: 06/19/17 11:20 AM  Result Value Ref Range   Glucose-Capillary 140 (H) 65 - 99 mg/dL   Comment 1 Notify RN    Comment 2 Document in Chart   Glucose, capillary     Status: Abnormal   Collection Time: 06/19/17  4:59 PM  Result Value Ref Range   Glucose-Capillary 155 (H) 65 - 99 mg/dL  Glucose, capillary     Status: Abnormal   Collection Time: 06/19/17  9:34 PM  Result Value Ref Range   Glucose-Capillary 124 (H) 65 - 99 mg/dL   Comment 1 Notify RN    Comment 2 Document in Chart   CBC with Differential/Platelet     Status: Abnormal   Collection Time: 06/20/17  3:16 AM  Result Value Ref Range   WBC 11.2 (H) 4.0 - 10.5 K/uL   RBC 3.49 (L) 4.22 - 5.81 MIL/uL   Hemoglobin 11.1 (L) 13.0 - 17.0 g/dL   HCT 32.2 (L) 39.0 - 52.0 %   MCV 92.3 78.0 - 100.0 fL   MCH 31.8 26.0 - 34.0 pg   MCHC 34.5 30.0 - 36.0 g/dL   RDW 12.1 11.5 - 15.5 %   Platelets 137 (L) 150 - 400 K/uL   Neutrophils Relative % 58 %   Neutro Abs 6.4 1.7 - 7.7 K/uL   Lymphocytes Relative 27 %   Lymphs Abs 3.0 0.7 - 4.0 K/uL   Monocytes Relative 11 %   Monocytes Absolute 1.3 (H) 0.1 - 1.0 K/uL   Eosinophils Relative 4 %   Eosinophils Absolute 0.5 0.0 - 0.7 K/uL   Basophils Relative 0 %   Basophils Absolute 0.0 0.0 - 0.1 K/uL    Comment: Performed at [facility], [address] [place], [place] [address]  Magnesium     Status: Abnormal   Collection Time: 06/20/17  3:16 AM  Result Value Ref Range   Magnesium 1.5 (L) 1.7 - 2.4 mg/dL    Comment: Performed at [facility] [address], [place], [place] [address]  Phosphorus      Status: None   Collection Time: 06/20/17  3:16 AM  Result Value Ref Range   Phosphorus 2.8 2.5 - 4.6 mg/dL    Comment: Performed at [facility] [address] [place], [place] [address]  Glucose, capillary     Status: Abnormal   Collection Time: 06/20/17  6:26 AM  Result Value Ref Range   Glucose-Capillary 116 (H) 65 - 99 mg/dL   Comment 1 Notify RN    Comment 2 Document in Chart   Glucose, capillary     Status: Abnormal   Collection Time: 06/20/17 11:21 AM  Result Value Ref Range   Glucose-Capillary 151 (H) 65 - 99 mg/dL    ECG   N/A - Personally Reviewed  Telemetry   Sinus rhythm  - Personally Reviewed  Radiology   No results found.  Cardiac Studies   LV EF: 60% -   65%  ------------------------------------------------------------------- Indications:      CHF - 428.0.  ------------------------------------------------------------------- History:  PMH:   Altered mental status.  Risk factors:  Diabetes mellitus.  ------------------------------------------------------------------- Study Conclusions  - Left ventricle: The cavity size was normal. Systolic function was   normal. The estimated ejection fraction was in the range of 60%   to 65%. Wall motion was normal; there were no regional wall   motion abnormalities by definity contrast  Impression   Active Problems:   Seizure (HCC) Non-ACS troponin elevation  Recommendation   1. Mild troponin elevation without chest pain - echo initially with reduced LV function, however, contrast study today shows normal LVEF. No regional wall motion abnormalities. I suspect non-ACS troponin elevation - possibly related to seizure. No further cardiac work-up is needed. Recommended follow-up with [place] after discharge in the office d/t cardiac history. Restart home cardiac meds prior to d/c.  Thanks for the consultation.  Time Spent Directly with Patient:  I have spent a total of 45 minutes with the patient  reviewing hospital notes, telemetry, EKGs, labs and examining the patient as well as establishing an assessment and plan that was discussed personally with the patient. > 50% of time was spent in direct patient care.  Length of Stay:  LOS: 4 days   Pixie Casino, MD, [facility], [place] Director of the Advanced Lipid Disorders &  Cardiovascular Risk Reduction Clinic Diplomate of the American Board of Clinical Lipidology Attending Cardiologist  Direct Dial: [phone]  Fax: [phone]  Website:  [email or web address] 06/20/2017, 1:06 PM

## 2017-06-20 NOTE — Progress Notes (Signed)
PROGRESS NOTE    Daniel [name]  OZH:[id number] DOB: 1933-09-08 DOA: 06/16/2017 PCP: Jani Gravel, MD      Brief Narrative:  Daniel [name] is a 84 yo M with DM, CHF prev 35% now 55% and CAD who presented with acute manual clumsiness followed by right-sided weakness and slurred speech.  His wife had taken his blood sugar earlier and it was 112.  EMS was called and he had a generalized seizure in route he has been intubated and is now mechanically ventilated.  He was worked up as a code stroke with a negative head CT and an MRI that does not show any acute infarct there is a small area of hemorrhage near the left vertex.  CTA showed no large vessel occlusion there is bilateral carotid bifurcation stenosis and the vertebrals are stenotic at their origins as well.  He has had one seizure in his life previously at a time where he was apparently unstable and had 2 coronary stents placed.  He has had no further seizure activity since that time.     Assessment & Plan:  Seizure EEG showed no epileptiform activity, but abnormal focus suggesting risk for future strokes.     -Continue Keppra    Coronary disease Hypertension Elevated troponin Troponin elevation low and flat, echocardiogram shows reduced EF again, regional wall motion abnormality initially on interpretation.  His last echo was two years ago and showed recovered EF (prior ot htat his last echo was in 2004 or 2005 when he had his NSTEMI and stents to LAD and RCA in setting of his first seizure) -Hold losartan -Continue aspirin, statin -Follow-up echocardiogram  -Consult Cardiology, appreciate recommendations   Delirium This started after extubation.  It is mild.  A critical care metabolic encephalopathy. Delirium precautions:   -Lights and TV off, minimize interruptions at night  -Blinds open and lights on during day  -Glasses/hearing aid with patient  -Frequent reorientation  -PT/OT when able  -Avoid sedation  medications/Beers list medications  Hypomagnesemia Magnesium persistently low - Replete magnesium  Aspiration pneumonia Pro-calcitonin undetectable, sputum culture reincubated. -Finish Augmentin today  Diabetes -Hold long-acting insulin -Continue SSI as needed  Chronic kidney disease stage III Baseline 1.4  Other medications -Continue eye drops -Continue finasteride -Continue SSRI        DVT prophylaxis: Change to Lovenox Code Status: DO NOT RESUSCITATE Family Communication: None present MDM and disposition Plan: The below labs and imaging reports were reviewed.  Echocardiogram report was reviewed.     He initially came in with seizure, requiring intubation for respiratory support.  He has now been extubated and is stable on new Keppra.  He has mild delirium, and we are working up his new reduced ejection fraction and elevated troponin.     Currently awaiting insurance approval for SNF placement, as well as cardiology evaluation.   Consultants:   Neurology  CCM  Procedures:   Intubated 3/27   CXR 3/27 IMPRESSION: Tube positions as described without pneumothorax. Left base consolidation consistent with pneumonia. Small left pleural effusion. Mild right base atelectasis.  Stable cardiomegaly.  Aortic atherosclerosis evident.  Aortic Atherosclerosis (ICD10-I70.0).    CTA head and neck IMPRESSION: 1. No emergent large vessel occlusion. 2. High-grade stenosis at the carotid bifurcations bilaterally. 3. Moderate stenoses within the cavernous internal carotid arteries bilaterally. 4. High-grade stenosis at the origin of the vertebral arteries bilaterally. 5. Fetal type left posterior cerebral artery. 6. Normal variant circle of Willis without significant proximal stenosis, aneurysm, or  branch vessel occlusion above the supraclinoid segments. 7. Multilevel spondylosis of the cervical spine as described. Foraminal disease is greatest at C3-4 and  C6-7. 8. The endotracheal tube terminates only 2.5 cm above the carina and could be pulled back 1-2 cm for more optimal positioning. 9.  Aortic Atherosclerosis (ICD10-I70.0).    MRI brain 3./27 IMPRESSION: Atrophy and small vessel disease. No acute intracranial findings. No acute stroke.    Extubated 3/28  Echocardiogram 3/29 Study Conclusions  - Left ventricle: The cavity size was normal. Wall thickness was   increased in a pattern of mild LVH. Systolic function was mildly   reduced. The estimated ejection fraction was in the range of 45%   to 50%. Basal to mid inferior wall hypokinesis. The study is not   technically sufficient to allow evaluation of LV diastolic   function. Ejection fraction (MOD, 2-plane): 50%. - Aortic valve: Sclerosis without stenosis. There was mild   regurgitation. - Mitral valve: Calcified annulus. Mildly thickened leaflets .   Moderate regurgitation. - Left atrium: Severely dilated. - Tricuspid valve: There was mild regurgitation. - Pulmonary arteries: PA peak pressure: 57 mm Hg (S). - Inferior vena cava: The vessel was dilated. The respirophasic   diameter changes were blunted (< 50%), consistent with elevated   central venous pressure.  Impressions:  - Compared to a prior study in 2016, the LVEF is reduced to around   45-50% with basal to mid inferior wall hypokinesis.    Echocardiogram 3/30 With definity, no RWMA or reduced EF.   Antimicrobials:   Zosyn 3/27 >> 3/28  Unasyn 3/28 >> 3/29  Augmentin 3/29 >> 3/31   Subjective: Cheerful, slept well.  Seems oriented, but cognitive slowing.  No new fever, cough, delirium, dyspnea, leg swelling.  No chest pain.  Sputum production.        Objective: Vitals:   06/19/17 2011 06/20/17 0002 06/20/17 0404 06/20/17 0830  BP: (!) 96/46 (!) 116/49 (!) 132/56 (!) 124/54  Pulse: 74 77 77 76  Resp: 16 18 18 16   Temp: 98 F (36.7 C) 98.1 F (36.7 C) 98.3 F (36.8 C) 98.9 F (37.2  C)  TempSrc: Oral Oral Oral Oral  SpO2: 98% 96% 96% 100%  Weight:   89.2 kg (196 lb 10.4 oz)     Intake/Output Summary (Last 24 hours) at 06/20/2017 1022 Last data filed at 06/19/2017 2300 Gross per 24 hour  Intake 100 ml  Output 300 ml  Net -200 ml   Filed Weights   06/18/17 0400 06/19/17 0500 06/20/17 0404  Weight: 87.7 kg (193 lb 5.5 oz) 87.6 kg (193 lb 2 oz) 89.2 kg (196 lb 10.4 oz)    Examination: General appearance: Elderly male, conversational, appears tired.  No acute distress. HEENT: Oral mucosa moist without lesions.  Teeth and gums normal.  Hearing normal. Skin: Skin warm and dry without suspicious rashes or lesions. Cardiac: Heart rate regular, rhythm normal, no murmurs, no lower extremity edema. Respiratory: Lungs clear to auscultation, respiratory effort normal. Abdomen: Abdomen soft without tenderness to palpation or guarding. MSK: No deformities or effusions. Neuro: He is lying in bed, awake and alert, cranial nerves normal.   Psych: He is interactive and oriented to situation, place and time.  He is somewhat fuzzy about why he is in the hospital, and his affect is flat, and his cognitive responses are slowed but his judgment and insight appear at baseline .   Data Reviewed: I have personally reviewed following labs and imaging  studies:  CBC: Recent Labs  Lab 06/16/17 1251 06/16/17 1257 06/17/17 0359 06/18/17 0759 06/19/17 0609 06/20/17 0316  WBC 17.6*  --  13.2* 11.5* 10.9* 11.2*  NEUTROABS 5.3  --  8.8* 6.9 6.9 6.4  HGB 13.8 13.6 12.1* 10.7* 10.6* 11.1*  HCT 41.1 40.0 35.6* 31.8* 31.7* 32.2*  MCV 96.7  --  92.7 93.8 93.2 92.3  PLT 156  --  129* 115* 126* 938*   Basic Metabolic Panel: Recent Labs  Lab 06/16/17 1251 06/16/17 1257 06/17/17 0359 06/18/17 0759 06/19/17 0609 06/20/17 0316  NA 137 139 135 134*  --   --   K 4.6 4.6 4.8 4.1  --   --   CL 108 109 105 106  --   --   CO2 13*  --  19* 21*  --   --   GLUCOSE 138* 130* 111* 104*  --   --    BUN 35* 36* 33* 36*  --   --   CREATININE 1.48* 1.40* 1.51* 1.59*  --   --   CALCIUM 9.2  --  8.5* 8.4*  --   --   MG  --   --  1.9 1.9 1.6* 1.5*  PHOS  --   --   --  3.3 3.0 2.8   GFR: Estimated Creatinine Clearance: 39.5 mL/min (A) (by C-G formula based on SCr of 1.59 mg/dL (H)). Liver Function Tests: Recent Labs  Lab 06/16/17 1251 06/17/17 0359  AST 34 36  ALT 21 17  ALKPHOS 55 43  BILITOT 0.9 1.1  PROT 7.1 6.0*  ALBUMIN 3.7 3.1*   No results for input(s): LIPASE, AMYLASE in the last 168 hours. No results for input(s): AMMONIA in the last 168 hours. Coagulation Profile: Recent Labs  Lab 06/16/17 1251  INR 1.19   Cardiac Enzymes: Recent Labs  Lab 06/16/17 1702 06/16/17 2328 06/17/17 0359  TROPONINI 0.06* 0.09* 0.10*   BNP (last 3 results) No results for input(s): PROBNP in the last 8760 hours. HbA1C: No results for input(s): HGBA1C in the last 72 hours. CBG: Recent Labs  Lab 06/19/17 0620 06/19/17 1120 06/19/17 1659 06/19/17 2134 06/20/17 0626  GLUCAP 147* 140* 155* 124* 116*   Lipid Profile: No results for input(s): CHOL, HDL, LDLCALC, TRIG, CHOLHDL, LDLDIRECT in the last 72 hours. Thyroid Function Tests: No results for input(s): TSH, T4TOTAL, FREET4, T3FREE, THYROIDAB in the last 72 hours. Anemia Panel: No results for input(s): VITAMINB12, FOLATE, FERRITIN, TIBC, IRON, RETICCTPCT in the last 72 hours. Urine analysis:    Component Value Date/Time   COLORURINE YELLOW 06/17/2017 0053   APPEARANCEUR CLEAR 06/17/2017 0053   LABSPEC 1.027 06/17/2017 0053   PHURINE 6.0 06/17/2017 0053   GLUCOSEU NEGATIVE 06/17/2017 0053   HGBUR NEGATIVE 06/17/2017 0053   BILIRUBINUR NEGATIVE 06/17/2017 0053   KETONESUR NEGATIVE 06/17/2017 0053   PROTEINUR 100 (A) 06/17/2017 0053   UROBILINOGEN 1.0 04/17/2013 1014   NITRITE NEGATIVE 06/17/2017 0053   LEUKOCYTESUR NEGATIVE 06/17/2017 0053   Sepsis Labs: @LABRCNTIP (procalcitonin:4,lacticacidven:4)  ) Recent  Results (from the past 240 hour(s))  MRSA PCR Screening     Status: None   Collection Time: 06/16/17  7:25 PM  Result Value Ref Range Status   MRSA by PCR NEGATIVE NEGATIVE Final    Comment:        The GeneXpert MRSA Assay (FDA approved for NASAL specimens only), is one component of a comprehensive MRSA colonization surveillance program. It is not intended to diagnose MRSA infection nor  to guide or monitor treatment for MRSA infections. Performed at [facility], [place] [address] [place], [place] [address]   Culture, respiratory (NON-Expectorated)     Status: None   Collection Time: 06/16/17 10:07 PM  Result Value Ref Range Status   Specimen Description TRACHEAL ASPIRATE  Final   Special Requests Normal  Final   Gram Stain   Final    FEW WBC PRESENT, PREDOMINANTLY PMN MODERATE GRAM POSITIVE COCCI IN PAIRS FEW GRAM NEGATIVE RODS FEW GRAM NEGATIVE DIPLOCOCCI RARE GRAM POSITIVE RODS    Culture   Final    FEW Consistent with normal respiratory flora. Performed at [facility], [place] [address], [place], [place] [address]    Report Status 06/19/2017 FINAL  Final  Culture, Urine     Status: None   Collection Time: 06/17/17 12:52 AM  Result Value Ref Range Status   Specimen Description URINE, CLEAN CATCH  Final   Special Requests Normal  Final   Culture   Final    NO GROWTH Performed at [facility], [place] [address] [place], [place] [address]    Report Status 06/18/2017 FINAL  Final         Radiology Studies: No results found.      Scheduled Meds: . amoxicillin-clavulanate  1 tablet Oral Q12H  . aspirin  81 mg Oral Daily  . chlorhexidine  15 mL Mouth Rinse BID  . citalopram  10 mg Oral Daily  . finasteride  5 mg Oral Daily  . insulin aspart  0-9 Units Subcutaneous TID WC  . latanoprost  1 drop Both Eyes QHS  . levETIRAcetam  750 mg Oral BID  . magnesium chloride  1 tablet Oral BID  . mouth rinse  15 mL Mouth Rinse q12n4p  . rosuvastatin  10  mg Oral q1800  . vitamin C  500 mg Oral Daily   Continuous Infusions:    LOS: 4 days    Time spent: 25 minutes    Edwin Dada, MD [facility] 06/20/2017, 10:22 AM     Pager [phone] --- please page though AMION:  [email or web address] If 7PM-7AM, please contact night-coverage

## 2017-06-21 DIAGNOSIS — I5022 Chronic systolic (congestive) heart failure: Secondary | ICD-10-CM | POA: Diagnosis not present

## 2017-06-21 DIAGNOSIS — R410 Disorientation, unspecified: Secondary | ICD-10-CM | POA: Diagnosis not present

## 2017-06-21 DIAGNOSIS — I2583 Coronary atherosclerosis due to lipid rich plaque: Secondary | ICD-10-CM | POA: Diagnosis not present

## 2017-06-21 DIAGNOSIS — I2510 Atherosclerotic heart disease of native coronary artery without angina pectoris: Secondary | ICD-10-CM | POA: Diagnosis not present

## 2017-06-21 DIAGNOSIS — E1122 Type 2 diabetes mellitus with diabetic chronic kidney disease: Secondary | ICD-10-CM | POA: Diagnosis not present

## 2017-06-21 DIAGNOSIS — I10 Essential (primary) hypertension: Secondary | ICD-10-CM | POA: Diagnosis not present

## 2017-06-21 DIAGNOSIS — R569 Unspecified convulsions: Secondary | ICD-10-CM | POA: Diagnosis not present

## 2017-06-21 DIAGNOSIS — N183 Chronic kidney disease, stage 3 (moderate): Secondary | ICD-10-CM | POA: Diagnosis not present

## 2017-06-21 DIAGNOSIS — R4182 Altered mental status, unspecified: Secondary | ICD-10-CM | POA: Diagnosis not present

## 2017-06-21 LAB — GLUCOSE, CAPILLARY
GLUCOSE-CAPILLARY: 123 mg/dL — AB (ref 65–99)
GLUCOSE-CAPILLARY: 209 mg/dL — AB (ref 65–99)
GLUCOSE-CAPILLARY: 150 mg/dL — AB (ref 65–99)
Glucose-Capillary: 152 mg/dL — ABNORMAL HIGH (ref 65–99)

## 2017-06-21 MED ORDER — WHITE PETROLATUM EX OINT
TOPICAL_OINTMENT | CUTANEOUS | Status: AC
  Administered 2017-06-21: 17:00:00
  Filled 2017-06-21: qty 28.35

## 2017-06-21 NOTE — Progress Notes (Signed)
Foley discontinued at this time.patient is due to void at 2200.  Ave Filter, RN

## 2017-06-21 NOTE — Care Management Important Message (Signed)
Important Message  Patient Details  Name: Daniel [name] MRN: [id number] Date of Birth: September 27, 1933   Medicare Important Message Given:  Yes    [name] Montine Circle 06/21/2017, 2:58 PM

## 2017-06-21 NOTE — Progress Notes (Signed)
Physical Therapy Treatment Patient Details Name: Daniel [name] MRN: [id number] DOB: Sep 26, 1933 Today's Date: 06/21/2017    History of Present Illness This is an 84 year old male presenting with altered mental status, seizure, right-sided weakness and slurred speech.  Video-EEG monitoring was performed to evaluate for seizures.pmhx of CAD, HFrEF, IDDM, CKD, tonic clonic seizure 15 years ago, and prior alcohol abuse (sober 4 years).     PT Comments    Patient is progressing very well towards their physical therapy goals. Although he continues to display posterior lean during standing and gait, he was able to progress ambulation distance to 30 feet with RW and min-mod assist. Will benefit from continued functional mobility focusing on gait training and balance. Awaiting SNF placement.     Follow Up Recommendations  SNF     Equipment Recommendations  None recommended by PT    Recommendations for Other Services       Precautions / Restrictions Precautions Precautions: Fall Restrictions Weight Bearing Restrictions: No    Mobility  Bed Mobility               General bed mobility comments: OOB in recliner  Transfers Overall transfer level: Needs assistance Equipment used: Rolling walker (2 wheeled) Transfers: Sit to/from Stand Sit to Stand: Min assist         General transfer comment: Min assist to stand from recliner to RW with R foot block to prevent anterior slide.    Ambulation/Gait Ambulation/Gait assistance: Min assist;Mod assist Ambulation Distance (Feet): 30 Feet Assistive device: Rolling walker (2 wheeled) Gait Pattern/deviations: Step-to pattern;Leaning posteriorly;Decreased stride length(side step)   Gait velocity interpretation: Below normal speed for age/gender General Gait Details: Patient continues with significant posterior lean requiring mod assist for balance initially but able to progress to min assist towards end of ambulation. Very slow gait  with decreased foot clearance and stride length. Max VC's for upright posture and RW proximity; patient able to achieve initially but not maintain.    Stairs            Wheelchair Mobility    Modified Rankin (Stroke Patients Only)       Balance Overall balance assessment: Needs assistance Sitting-balance support: Feet supported;Bilateral upper extremity supported Sitting balance-Leahy Scale: Fair   Postural control: Posterior lean Standing balance support: Bilateral upper extremity supported Standing balance-Leahy Scale: Poor Standing balance comment: mod assist in standing to prevent posterior LOB.                             Cognition Arousal/Alertness: Awake/alert Behavior During Therapy: WFL for tasks assessed/performed Overall Cognitive Status: No family/caregiver present to determine baseline cognitive functioning                                        Exercises      General Comments        Pertinent Vitals/Pain Pain Assessment: No/denies pain    Home Living                      Prior Function            PT Goals (current goals can now be found in the care plan section) Acute Rehab PT Goals Patient Stated Goal: to get better PT Goal Formulation: With patient Time For Goal Achievement: 07/01/17 Potential to Achieve Goals: Fair  Progress towards PT goals: Progressing toward goals    Frequency    Min 2X/week      PT Plan Current plan remains appropriate    Co-evaluation              AM-PAC PT "6 Clicks" Daily Activity  Outcome Measure  Difficulty turning over in bed (including adjusting bedclothes, sheets and blankets)?: Unable Difficulty moving from lying on back to sitting on the side of the bed? : Unable Difficulty sitting down on and standing up from a chair with arms (e.g., wheelchair, bedside commode, etc,.)?: Unable Help needed moving to and from a bed to chair (including a wheelchair)?: A  Little Help needed walking in hospital room?: A Lot Help needed climbing 3-5 steps with a railing? : Total 6 Click Score: 9    End of Session Equipment Utilized During Treatment: Gait belt Activity Tolerance: Patient tolerated treatment well Patient left: with call bell/phone within reach;in chair;with chair alarm set;with family/visitor present   PT Visit Diagnosis: Unsteadiness on feet (R26.81);Muscle weakness (generalized) (M62.81)     Time: 1610-9604 PT Time Calculation (min) (ACUTE ONLY): 13 min  Charges:  $Gait Training: 8-22 mins                    G Codes:       Ellamae Sia, PT, DPT Acute Rehabilitation Services  Pager: [phone]    Willy Eddy 06/21/2017, 1:08 PM

## 2017-06-21 NOTE — Progress Notes (Signed)
CSW met with patient's wife to confirm that [place] had initiated insurance authorization, and that we still hadn't heard from [facility]. Patient's wife expressed frustration that this process was so slow, and CSW acknowledged that [employer] is one of the slowest insurances to approve requests. Patient's wife said she would call and complain to her insurance representative.  CSW will continue to follow.  Laveda Abbe, [place] Clinical Social Worker [phone]

## 2017-06-21 NOTE — Progress Notes (Signed)
PROGRESS NOTE    Daniel [name]  FBP:[id number] DOB: [birth year] DOA: 06/16/2017 PCP: Jani Gravel, MD      Brief Narrative:  Mr. Pultz is a 84 yo M with DM, CHF prev 35% now 55% and CAD who presented with acute manual clumsiness followed by right-sided weakness and slurred speech.  His wife had taken his blood sugar earlier and it was 112.  EMS was called and he had a generalized seizure in route he has been intubated and is now mechanically ventilated.  He was worked up as a code stroke with a negative head CT and an MRI that does not show any acute infarct there is a small area of hemorrhage near the left vertex.  CTA showed no large vessel occlusion there is bilateral carotid bifurcation stenosis and the vertebrals are stenotic at their origins as well.  He has had one seizure in his life previously at a time where he was apparently unstable and had 2 coronary stents placed.  He has had no further seizure activity since that time.     Assessment & Plan:  Seizure EEG showed no epileptiform activity, but abnormal focus suggesting risk for future strokes.     -Continue Keppra -F/u with [facility] in 4-6 weeks    Coronary disease Hypertension Elevated troponin Troponin elevation low and flat, echocardiogram shows reduced EF again, regional wall motion abnormality initially on interpretation.  His last echo was two years ago and showed recovered EF (prior ot htat his last echo was in 2004 or 2005 when he had his NSTEMI and stents to LAD and RCA in setting of his first seizure)  Appreciate cardiology recommendations, no further ischemic workup. -Hold losartan -Continue aspirin, statin  Delirium This started after extubation.  It is mild.  A critical care metabolic encephalopathy.  Improved. Delirium precautions:   -Lights and TV off, minimize interruptions at night  -Blinds open and lights on during day  -Glasses/hearing aid with patient  -Frequent reorientation  -PT/OT when  able  -Avoid sedation medications/Beers list medications  Hypomagnesemia Repleted  Aspiration pneumonia Pro-calcitonin undetectable, sputum culture reincubated. Completed 5d Augmentin  Diabetes -Hold home Lantus -Continue SSI  Chronic kidney disease stage III Baseline 1.4  Other medications -Continue eye drops -Continue finasteride -Continue SSRI        DVT prophylaxis:  Lovenox Code Status: DO NOT RESUSCITATE Family Communication: None present MDM and disposition Plan: The below labs and blood sugars were reviewed.  The above medications were continued.    He initially came in with seizure, requiring intubation for respiratory support.  He has now been extubated and is stable on new Keppra.  He had mild delirium which is moslty resolved and hsi EF appeared initially to be reduced, but on closer evaluation was nromal.  No further ischemic workup.   Currently awaiting insurance approval for SNF placement    Consultants:   Neurology  CCM  Procedures:   Intubated 3/27   CXR 3/27 IMPRESSION: Tube positions as described without pneumothorax. Left base consolidation consistent with pneumonia. Small left pleural effusion. Mild right base atelectasis.  Stable cardiomegaly.  Aortic atherosclerosis evident.  Aortic Atherosclerosis (ICD10-I70.0).    CTA head and neck IMPRESSION: 1. No emergent large vessel occlusion. 2. High-grade stenosis at the carotid bifurcations bilaterally. 3. Moderate stenoses within the cavernous internal carotid arteries bilaterally. 4. High-grade stenosis at the origin of the vertebral arteries bilaterally. 5. Fetal type left posterior cerebral artery. 6. Normal variant circle of Willis without significant  proximal stenosis, aneurysm, or branch vessel occlusion above the supraclinoid segments. 7. Multilevel spondylosis of the cervical spine as described. Foraminal disease is greatest at C3-4 and C6-7. 8. The endotracheal tube  terminates only 2.5 cm above the carina and could be pulled back 1-2 cm for more optimal positioning. 9.  Aortic Atherosclerosis (ICD10-I70.0).    MRI brain 3./27 IMPRESSION: Atrophy and small vessel disease. No acute intracranial findings. No acute stroke.    Extubated 3/28  Echocardiogram 3/29 Study Conclusions  - Left ventricle: The cavity size was normal. Wall thickness was   increased in a pattern of mild LVH. Systolic function was mildly   reduced. The estimated ejection fraction was in the range of 45%   to 50%. Basal to mid inferior wall hypokinesis. The study is not   technically sufficient to allow evaluation of LV diastolic   function. Ejection fraction (MOD, 2-plane): 50%. - Aortic valve: Sclerosis without stenosis. There was mild   regurgitation. - Mitral valve: Calcified annulus. Mildly thickened leaflets .   Moderate regurgitation. - Left atrium: Severely dilated. - Tricuspid valve: There was mild regurgitation. - Pulmonary arteries: PA peak pressure: 57 mm Hg (S). - Inferior vena cava: The vessel was dilated. The respirophasic   diameter changes were blunted (< 50%), consistent with elevated   central venous pressure.  Impressions:  - Compared to a prior study in 2016, the LVEF is reduced to around   45-50% with basal to mid inferior wall hypokinesis.    Echocardiogram 3/30 With definity, no RWMA or reduced EF.   Antimicrobials:   Zosyn 3/27 >> 3/28  Unasyn 3/28 >> 3/29  Augmentin 3/29 >> 3/31   Subjective: Feels well, no confusion, weakness. [place] game.  No new fever, cough, sputum, leg swelling, chest pain.  No hallucinations, agitation.         Objective: Vitals:   06/21/17 0400 06/21/17 0932 06/21/17 1145 06/21/17 1550  BP: 126/62 (!) 124/55 (!) 104/50 (!) 108/48  Pulse: 69 71 71 66  Resp: 17 20 16 20   Temp: 98.2 F (36.8 C) 98.3 F (36.8 C) 98 F (36.7 C) 97.9 F (36.6 C)  TempSrc: Oral Oral Oral Oral  SpO2:  94% 98% 97% 98%  Weight: 89.7 kg (197 lb 12 oz)       Intake/Output Summary (Last 24 hours) at 06/21/2017 1637 Last data filed at 06/21/2017 1320 Gross per 24 hour  Intake 360 ml  Output -  Net 360 ml   Filed Weights   06/19/17 0500 06/20/17 0404 06/21/17 0400  Weight: 87.6 kg (193 lb 2 oz) 89.2 kg (196 lb 10.4 oz) 89.7 kg (197 lb 12 oz)    Examination: General appearance: Elderly male, lying in bed, no acute distress, conversational. HEENT: The oral mucosa are moist without lesions, teeth and gums are normal, hearing is normal. Skin: Skin is warm and dry without suspicious rashes or lesions. Cardiac: RRR, no murmurs, no lower extremity edema. Respiratory: Respiratory effort normal, lungs clear to auscultation. Abdomen: Abdomen soft without tenderness to palpation or guarding. MSK: No deformities or effusions. Neuro: Cranial nerves symmetric.  Lying in bed, awake and alert.    Psych: Oriented to situation, place and time.  Remembers watching the basketball game last night.  Affect is flat and judgment and insight appear somewhat impaired, baseline, and normal for age.     Data Reviewed: I have personally reviewed following labs and imaging studies:  CBC: Recent Labs  Lab 06/16/17 1251 06/16/17  1257 06/17/17 0359 06/18/17 0759 06/19/17 0609 06/20/17 0316  WBC 17.6*  --  13.2* 11.5* 10.9* 11.2*  NEUTROABS 5.3  --  8.8* 6.9 6.9 6.4  HGB 13.8 13.6 12.1* 10.7* 10.6* 11.1*  HCT 41.1 40.0 35.6* 31.8* 31.7* 32.2*  MCV 96.7  --  92.7 93.8 93.2 92.3  PLT 156  --  129* 115* 126* 163*   Basic Metabolic Panel: Recent Labs  Lab 06/16/17 1251 06/16/17 1257 06/17/17 0359 06/18/17 0759 06/19/17 0609 06/20/17 0316  NA 137 139 135 134*  --   --   K 4.6 4.6 4.8 4.1  --   --   CL 108 109 105 106  --   --   CO2 13*  --  19* 21*  --   --   GLUCOSE 138* 130* 111* 104*  --   --   BUN 35* 36* 33* 36*  --   --   CREATININE 1.48* 1.40* 1.51* 1.59*  --   --   CALCIUM 9.2  --  8.5* 8.4*   --   --   MG  --   --  1.9 1.9 1.6* 1.5*  PHOS  --   --   --  3.3 3.0 2.8   GFR: Estimated Creatinine Clearance: 39.6 mL/min (A) (by C-G formula based on SCr of 1.59 mg/dL (H)). Liver Function Tests: Recent Labs  Lab 06/16/17 1251 06/17/17 0359  AST 34 36  ALT 21 17  ALKPHOS 55 43  BILITOT 0.9 1.1  PROT 7.1 6.0*  ALBUMIN 3.7 3.1*   No results for input(s): LIPASE, AMYLASE in the last 168 hours. No results for input(s): AMMONIA in the last 168 hours. Coagulation Profile: Recent Labs  Lab 06/16/17 1251  INR 1.19   Cardiac Enzymes: Recent Labs  Lab 06/16/17 1702 06/16/17 2328 06/17/17 0359  TROPONINI 0.06* 0.09* 0.10*   BNP (last 3 results) No results for input(s): PROBNP in the last 8760 hours. HbA1C: No results for input(s): HGBA1C in the last 72 hours. CBG: Recent Labs  Lab 06/20/17 1633 06/20/17 2141 06/21/17 0609 06/21/17 1117 06/21/17 1609  GLUCAP 126* 159* 123* 209* 152*   Lipid Profile: No results for input(s): CHOL, HDL, LDLCALC, TRIG, CHOLHDL, LDLDIRECT in the last 72 hours. Thyroid Function Tests: No results for input(s): TSH, T4TOTAL, FREET4, T3FREE, THYROIDAB in the last 72 hours. Anemia Panel: No results for input(s): VITAMINB12, FOLATE, FERRITIN, TIBC, IRON, RETICCTPCT in the last 72 hours. Urine analysis:    Component Value Date/Time   COLORURINE YELLOW 06/17/2017 0053   APPEARANCEUR CLEAR 06/17/2017 0053   LABSPEC 1.027 06/17/2017 0053   PHURINE 6.0 06/17/2017 0053   GLUCOSEU NEGATIVE 06/17/2017 0053   HGBUR NEGATIVE 06/17/2017 0053   BILIRUBINUR NEGATIVE 06/17/2017 0053   KETONESUR NEGATIVE 06/17/2017 0053   PROTEINUR 100 (A) 06/17/2017 0053   UROBILINOGEN 1.0 04/17/2013 1014   NITRITE NEGATIVE 06/17/2017 0053   LEUKOCYTESUR NEGATIVE 06/17/2017 0053   Sepsis Labs: @LABRCNTIP (procalcitonin:4,lacticacidven:4)  ) Recent Results (from the past 240 hour(s))  MRSA PCR Screening     Status: None   Collection Time: 06/16/17  7:25 PM    Result Value Ref Range Status   MRSA by PCR NEGATIVE NEGATIVE Final    Comment:        The GeneXpert MRSA Assay (FDA approved for NASAL specimens only), is one component of a comprehensive MRSA colonization surveillance program. It is not intended to diagnose MRSA infection nor to guide or monitor treatment for MRSA infections. Performed  at [facility], [place] [address], [place], [place] [address]   Culture, respiratory (NON-Expectorated)     Status: None   Collection Time: 06/16/17 10:07 PM  Result Value Ref Range Status   Specimen Description TRACHEAL ASPIRATE  Final   Special Requests Normal  Final   Gram Stain   Final    FEW WBC PRESENT, PREDOMINANTLY PMN MODERATE GRAM POSITIVE COCCI IN PAIRS FEW GRAM NEGATIVE RODS FEW GRAM NEGATIVE DIPLOCOCCI RARE GRAM POSITIVE RODS    Culture   Final    FEW Consistent with normal respiratory flora. Performed at [facility], [place] [address] [place], [place] [address]    Report Status 06/19/2017 FINAL  Final  Culture, Urine     Status: None   Collection Time: 06/17/17 12:52 AM  Result Value Ref Range Status   Specimen Description URINE, CLEAN CATCH  Final   Special Requests Normal  Final   Culture   Final    NO GROWTH Performed at [facility], [place] [address] [place], [place] [address]    Report Status 06/18/2017 FINAL  Final         Radiology Studies: No results found.      Scheduled Meds: . aspirin  81 mg Oral Daily  . chlorhexidine  15 mL Mouth Rinse BID  . citalopram  10 mg Oral Daily  . finasteride  5 mg Oral Daily  . insulin aspart  0-9 Units Subcutaneous TID WC  . latanoprost  1 drop Both Eyes QHS  . levETIRAcetam  750 mg Oral BID  . mouth rinse  15 mL Mouth Rinse q12n4p  . rosuvastatin  10 mg Oral q1800  . vitamin C  500 mg Oral Daily  . white petrolatum       Continuous Infusions:    LOS: 5 days    Time spent: 25 minutes    Edwin Dada, MD [facility] 06/21/2017, 4:37 PM     Pager [phone] --- please page though AMION:  [email or web address] If 7PM-7AM, please contact night-coverage

## 2017-06-21 NOTE — Care Management Note (Signed)
Case Management Note  Patient Details  Name: Daniel [name] MRN: [id number] Date of Birth: 05/11/1933  Subjective/Objective:                    Action/Plan: Plan is for SNF. Awaiting insurance authorization from his [employer]. CM following for d/c disposition.  Expected Discharge Date:  06/19/17               Expected Discharge Plan:  Skilled Nursing Facility  In-House Referral:  Clinical Social Work  Discharge planning Services     Post Acute Care Choice:    Choice offered to:     DME Arranged:    DME Agency:     HH Arranged:    HH Agency:     Status of Service:  In process, will continue to follow  If discussed at Long Length of Stay Meetings, dates discussed:    Additional Comments:  Pollie Friar, RN 06/21/2017, 12:08 PM

## 2017-06-22 DIAGNOSIS — I5022 Chronic systolic (congestive) heart failure: Secondary | ICD-10-CM | POA: Diagnosis not present

## 2017-06-22 DIAGNOSIS — I10 Essential (primary) hypertension: Secondary | ICD-10-CM | POA: Diagnosis not present

## 2017-06-22 DIAGNOSIS — R4182 Altered mental status, unspecified: Secondary | ICD-10-CM | POA: Diagnosis not present

## 2017-06-22 DIAGNOSIS — I2510 Atherosclerotic heart disease of native coronary artery without angina pectoris: Secondary | ICD-10-CM | POA: Diagnosis not present

## 2017-06-22 DIAGNOSIS — E1122 Type 2 diabetes mellitus with diabetic chronic kidney disease: Secondary | ICD-10-CM | POA: Diagnosis not present

## 2017-06-22 DIAGNOSIS — R410 Disorientation, unspecified: Secondary | ICD-10-CM | POA: Diagnosis not present

## 2017-06-22 DIAGNOSIS — N183 Chronic kidney disease, stage 3 (moderate): Secondary | ICD-10-CM | POA: Diagnosis not present

## 2017-06-22 DIAGNOSIS — R569 Unspecified convulsions: Secondary | ICD-10-CM | POA: Diagnosis not present

## 2017-06-22 DIAGNOSIS — I2583 Coronary atherosclerosis due to lipid rich plaque: Secondary | ICD-10-CM | POA: Diagnosis not present

## 2017-06-22 LAB — GLUCOSE, CAPILLARY
GLUCOSE-CAPILLARY: 125 mg/dL — AB (ref 65–99)
GLUCOSE-CAPILLARY: 176 mg/dL — AB (ref 65–99)
GLUCOSE-CAPILLARY: 150 mg/dL — AB (ref 65–99)
GLUCOSE-CAPILLARY: 146 mg/dL — AB (ref 65–99)

## 2017-06-22 NOTE — Progress Notes (Signed)
Pt foley catheter was d/c at 4pm during day shift, pt yet to void, bladder scan done at 2330, read greater than 373ml, pt later had an incontinent episode, voided on the bed, in and out cath done afterwards, drained about 219ml, pt cleaned up and made comfortable in bed, v/s stable, will continue to monitor. Obasog[place]-Asidi, Ranell Skibinski Efe

## 2017-06-22 NOTE — Progress Notes (Signed)
PROGRESS NOTE    Daniel [name]  YKD:[id number] DOB: [birth year] DOA: 06/16/2017 PCP: Jani Gravel, MD      Brief Narrative:  Mr. Daniel [name] is a 84 yo M with DM, CHF prev 35% now 55% and CAD who presented with acute manual clumsiness followed by right-sided weakness and slurred speech.  His wife had taken his blood sugar earlier and it was 112.  EMS was called and he had a generalized seizure in route he has been intubated and is now mechanically ventilated.  He was worked up as a code stroke with a negative head CT and an MRI that does not show any acute infarct there is a small area of hemorrhage near the left vertex.  CTA showed no large vessel occlusion there is bilateral carotid bifurcation stenosis and the vertebrals are stenotic at their origins as well.  He has had one seizure in his life previously at a time where he was apparently unstable and had 2 coronary stents placed.  He has had no further seizure activity since that time.     Assessment & Plan:  Acute hypoxic respiratory failure Secondary to seizure post-ictal state only, for airway protection.  Intubated 3/27, extubated without event on 3/28.  No further respiratory concerns.   Seizure MRI normal, no infarct or mass or hemorrhage.  EEG showed no epileptiform activity, but abnormal focus suggesting risk for future strokes.    Was restarted on Keppra. -Continue Keppra -Needs f/u with [facility] in 4-6 weeks at the time of discharge  Coronary disease Hypertension Elevated troponin Troponin elevation low and flat, initial echocardiogram shows reduced EF, regional wall motion abnormality.  (He has a history of NSTEMI in 2004 in [place], got LAD and RCA stents at that time, incidentally, this event was in the context of a seizure).  Now follows with Dr. Abelina Bachelor, had regained normal EF at last echo.  Here, echo was repeated with Definity, showed normal EF, no RWMA.  Cardiology were consulted, agreed that no further ischemic work  up was warranted. -Home losartan is still on hold due to soft BP here -Continue aspirin, statin  Delirium At baseline, the patient is quite alert and oriented.  He is physically slow, and no longer drives, and has history of heavier EtOH use, but family note no memory issues or concerns for dementia.  Post-extubation he had persistent confusion/disorientation/delirium, which was thought to be a  critical care metabolic encephalopathy.  It has now resolved with time. Continue routine delirium precautions:   -Lights and TV off, minimize interruptions at night  -Blinds open and lights on during day  -Glasses/hearing aid with patient  -Frequent reorientation  -PT/OT when able  -Avoid sedation medications/Beers list medications  Hypomagnesemia Repleted  Aspiration pneumonia Started on antibiotics due to equivocal CXR after intubation.  Pro-calcitonin undetectable, sputum culture normal respiratory flora.  Completed 5d Augmentin.  Diabetes Sugars somewhat low. -Home Lantus is still on hold.   -Continue SSI  Chronic kidney disease stage III Baseline 1.4  BPH Mild urinary retention last night.  Other medications -Continue eye drops -Continue finasteride -Continue SSRI        DVT prophylaxis:  Lovenox Code Status: DO NOT RESUSCITATE Family Communication: None present MDM and disposition Plan: THe above medications were continued.  He has 4 stable problems.    He initially came in with seizure, requiring intubation.  Now stable on Keppra.  He had mild delirium which is moslty resolved and hsi EF appeared initially to be reduced,  but on closer evaluation was nromal.  No further ischemic workup.    Currently awaiting insurance approval for SNF placement    Consultants:   Neurology  CCM  Procedures:   Intubated 3/27   CXR 3/27 IMPRESSION: Tube positions as described without pneumothorax. Left base consolidation consistent with pneumonia. Small left  pleural effusion. Mild right base atelectasis.  Stable cardiomegaly.  Aortic atherosclerosis evident.  Aortic Atherosclerosis (ICD10-I70.0).    CTA head and neck IMPRESSION: 1. No emergent large vessel occlusion. 2. High-grade stenosis at the carotid bifurcations bilaterally. 3. Moderate stenoses within the cavernous internal carotid arteries bilaterally. 4. High-grade stenosis at the origin of the vertebral arteries bilaterally. 5. Fetal type left posterior cerebral artery. 6. Normal variant circle of Willis without significant proximal stenosis, aneurysm, or branch vessel occlusion above the supraclinoid segments. 7. Multilevel spondylosis of the cervical spine as described. Foraminal disease is greatest at C3-4 and C6-7. 8. The endotracheal tube terminates only 2.5 cm above the carina and could be pulled back 1-2 cm for more optimal positioning. 9.  Aortic Atherosclerosis (ICD10-I70.0).    MRI brain 3./27 IMPRESSION: Atrophy and small vessel disease. No acute intracranial findings. No acute stroke.    Extubated 3/28  Echocardiogram 3/29 Study Conclusions  - Left ventricle: The cavity size was normal. Wall thickness was   increased in a pattern of mild LVH. Systolic function was mildly   reduced. The estimated ejection fraction was in the range of 45%   to 50%. Basal to mid inferior wall hypokinesis. The study is not   technically sufficient to allow evaluation of LV diastolic   function. Ejection fraction (MOD, 2-plane): 50%. - Aortic valve: Sclerosis without stenosis. There was mild   regurgitation. - Mitral valve: Calcified annulus. Mildly thickened leaflets .   Moderate regurgitation. - Left atrium: Severely dilated. - Tricuspid valve: There was mild regurgitation. - Pulmonary arteries: PA peak pressure: 57 mm Hg (S). - Inferior vena cava: The vessel was dilated. The respirophasic   diameter changes were blunted (< 50%), consistent with elevated    central venous pressure.  Impressions:  - Compared to a prior study in 2016, the LVEF is reduced to around   45-50% with basal to mid inferior wall hypokinesis.    Echocardiogram 3/30 With definity, no RWMA or reduced EF.   Antimicrobials:   Zosyn 3/27 >> 3/28  Unasyn 3/28 >> 3/29  Augmentin 3/29 >> 3/31   Subjective: Feeling well, no confusion, no weakness, no seizures, no slurred speech, no disorientation.  No fever, cough, sputum, leg swelling, chest pain, hallucinations still.  Had an episode overnight of 250cc residual urine, but no dysuria, fever.          Objective: Vitals:   06/22/17 0800 06/22/17 1145 06/22/17 1158 06/22/17 1607  BP: (!) 111/56  (!) 111/59 (!) 110/56  Pulse: 72  64 (!) 59  Resp: 18  18 16   Temp: 98.1 F (36.7 C)   97.9 F (36.6 C)  TempSrc: Oral   Oral  SpO2: 97% 95% 100%   Weight:      Height:        Intake/Output Summary (Last 24 hours) at 06/22/2017 1750 Last data filed at 06/22/2017 1300 Gross per 24 hour  Intake 840 ml  Output 325 ml  Net 515 ml   Filed Weights   06/20/17 0404 06/21/17 0400 06/22/17 0400  Weight: 89.2 kg (196 lb 10.4 oz) 89.7 kg (197 lb 12 oz) 91.5 kg (201 lb 11.5  oz)    Examination: General appearance: Elderly adult male, lying in bed, interactive, no acute distress. HEENT: The oral mucosa are moist without lesions, teeth and gums are normal, hearing is normal. Skin: Skin is warm and dry without suspicious rashes or lesions.   Cardiac: Rate and rhythm, no murmurs, no lower extremity edema. Respiratory: Effort is normal, lungs clear to auscultation. Abdomen: Abdomen is soft without tenderness. MSK: No deformities or effusions. Neuro: Nerves are symmetric, he is interactive, moves all extremities equally and with normal coordination.    Psych: He is oriented to situation, place, time, and self.  His affect is normal, judgment and insight appear normal for age.     Data Reviewed: I have personally reviewed  following labs and imaging studies:  CBC: Recent Labs  Lab 06/16/17 1251 06/16/17 1257 06/17/17 0359 06/18/17 0759 06/19/17 0609 06/20/17 0316  WBC 17.6*  --  13.2* 11.5* 10.9* 11.2*  NEUTROABS 5.3  --  8.8* 6.9 6.9 6.4  HGB 13.8 13.6 12.1* 10.7* 10.6* 11.1*  HCT 41.1 40.0 35.6* 31.8* 31.7* 32.2*  MCV 96.7  --  92.7 93.8 93.2 92.3  PLT 156  --  129* 115* 126* 676*   Basic Metabolic Panel: Recent Labs  Lab 06/16/17 1251 06/16/17 1257 06/17/17 0359 06/18/17 0759 06/19/17 0609 06/20/17 0316  NA 137 139 135 134*  --   --   K 4.6 4.6 4.8 4.1  --   --   CL 108 109 105 106  --   --   CO2 13*  --  19* 21*  --   --   GLUCOSE 138* 130* 111* 104*  --   --   BUN 35* 36* 33* 36*  --   --   CREATININE 1.48* 1.40* 1.51* 1.59*  --   --   CALCIUM 9.2  --  8.5* 8.4*  --   --   MG  --   --  1.9 1.9 1.6* 1.5*  PHOS  --   --   --  3.3 3.0 2.8   GFR: Estimated Creatinine Clearance: 40 mL/min (A) (by C-G formula based on SCr of 1.59 mg/dL (H)). Liver Function Tests: Recent Labs  Lab 06/16/17 1251 06/17/17 0359  AST 34 36  ALT 21 17  ALKPHOS 55 43  BILITOT 0.9 1.1  PROT 7.1 6.0*  ALBUMIN 3.7 3.1*   No results for input(s): LIPASE, AMYLASE in the last 168 hours. No results for input(s): AMMONIA in the last 168 hours. Coagulation Profile: Recent Labs  Lab 06/16/17 1251  INR 1.19   Cardiac Enzymes: Recent Labs  Lab 06/16/17 1702 06/16/17 2328 06/17/17 0359  TROPONINI 0.06* 0.09* 0.10*   BNP (last 3 results) No results for input(s): PROBNP in the last 8760 hours. HbA1C: No results for input(s): HGBA1C in the last 72 hours. CBG: Recent Labs  Lab 06/21/17 1609 06/21/17 2122 06/22/17 0639 06/22/17 1117 06/22/17 1628  GLUCAP 152* 150* 125* 176* 150*   Lipid Profile: No results for input(s): CHOL, HDL, LDLCALC, TRIG, CHOLHDL, LDLDIRECT in the last 72 hours. Thyroid Function Tests: No results for input(s): TSH, T4TOTAL, FREET4, T3FREE, THYROIDAB in the last 72  hours. Anemia Panel: No results for input(s): VITAMINB12, FOLATE, FERRITIN, TIBC, IRON, RETICCTPCT in the last 72 hours. Urine analysis:    Component Value Date/Time   COLORURINE YELLOW 06/17/2017 [place] 06/17/2017 0053   LABSPEC 1.027 06/17/2017 0053   PHURINE 6.0 06/17/2017 [place] 06/17/2017 0053  HGBUR NEGATIVE 06/17/2017 [place] 06/17/2017 0053   KETONESUR NEGATIVE 06/17/2017 0053   PROTEINUR 100 (A) 06/17/2017 0053   UROBILINOGEN 1.0 04/17/2013 1014   NITRITE NEGATIVE 06/17/2017 0053   LEUKOCYTESUR NEGATIVE 06/17/2017 0053   Sepsis Labs: @LABRCNTIP (procalcitonin:4,lacticacidven:4)  ) Recent Results (from the past 240 hour(s))  MRSA PCR Screening     Status: None   Collection Time: 06/16/17  7:25 PM  Result Value Ref Range Status   MRSA by PCR NEGATIVE NEGATIVE Final    Comment:        The GeneXpert MRSA Assay (FDA approved for NASAL specimens only), is one component of a comprehensive MRSA colonization surveillance program. It is not intended to diagnose MRSA infection nor to guide or monitor treatment for MRSA infections. Performed at [facility], [place] [address], [place], [place] [address]   Culture, respiratory (NON-Expectorated)     Status: None   Collection Time: 06/16/17 10:07 PM  Result Value Ref Range Status   Specimen Description TRACHEAL ASPIRATE  Final   Special Requests Normal  Final   Gram Stain   Final    FEW WBC PRESENT, PREDOMINANTLY PMN MODERATE GRAM POSITIVE COCCI IN PAIRS FEW GRAM NEGATIVE RODS FEW GRAM NEGATIVE DIPLOCOCCI RARE GRAM POSITIVE RODS    Culture   Final    FEW Consistent with normal respiratory flora. Performed at [facility], [place] [address], [place], [place] [address]    Report Status 06/19/2017 FINAL  Final  Culture, Urine     Status: None   Collection Time: 06/17/17 12:52 AM  Result Value Ref Range Status   Specimen Description URINE, CLEAN CATCH   Final   Special Requests Normal  Final   Culture   Final    NO GROWTH Performed at [facility], [place] [address] [place], [place] [address]    Report Status 06/18/2017 FINAL  Final         Radiology Studies: No results found.      Scheduled Meds: . aspirin  81 mg Oral Daily  . chlorhexidine  15 mL Mouth Rinse BID  . citalopram  10 mg Oral Daily  . finasteride  5 mg Oral Daily  . insulin aspart  0-9 Units Subcutaneous TID WC  . latanoprost  1 drop Both Eyes QHS  . levETIRAcetam  750 mg Oral BID  . mouth rinse  15 mL Mouth Rinse q12n4p  . rosuvastatin  10 mg Oral q1800  . vitamin C  500 mg Oral Daily   Continuous Infusions:    LOS: 6 days    Time spent: 15 minutes    Edwin Dada, MD [facility] 06/22/2017, 12:50 PM     Pager [phone] --- please page though AMION:  [email or web address] If 7PM-7AM, please contact night-coverage

## 2017-06-23 DIAGNOSIS — Z794 Long term (current) use of insulin: Secondary | ICD-10-CM

## 2017-06-23 DIAGNOSIS — Z9189 Other specified personal risk factors, not elsewhere classified: Secondary | ICD-10-CM | POA: Diagnosis not present

## 2017-06-23 DIAGNOSIS — R41841 Cognitive communication deficit: Secondary | ICD-10-CM | POA: Diagnosis not present

## 2017-06-23 DIAGNOSIS — M6281 Muscle weakness (generalized): Secondary | ICD-10-CM | POA: Diagnosis not present

## 2017-06-23 DIAGNOSIS — E118 Type 2 diabetes mellitus with unspecified complications: Secondary | ICD-10-CM

## 2017-06-23 DIAGNOSIS — R4182 Altered mental status, unspecified: Secondary | ICD-10-CM | POA: Diagnosis not present

## 2017-06-23 DIAGNOSIS — I5022 Chronic systolic (congestive) heart failure: Secondary | ICD-10-CM | POA: Diagnosis not present

## 2017-06-23 DIAGNOSIS — G40901 Epilepsy, unspecified, not intractable, with status epilepticus: Secondary | ICD-10-CM | POA: Diagnosis not present

## 2017-06-23 DIAGNOSIS — E1149 Type 2 diabetes mellitus with other diabetic neurological complication: Secondary | ICD-10-CM | POA: Diagnosis not present

## 2017-06-23 DIAGNOSIS — N189 Chronic kidney disease, unspecified: Secondary | ICD-10-CM | POA: Diagnosis not present

## 2017-06-23 DIAGNOSIS — R2689 Other abnormalities of gait and mobility: Secondary | ICD-10-CM | POA: Diagnosis not present

## 2017-06-23 DIAGNOSIS — I2510 Atherosclerotic heart disease of native coronary artery without angina pectoris: Secondary | ICD-10-CM | POA: Diagnosis not present

## 2017-06-23 DIAGNOSIS — R569 Unspecified convulsions: Secondary | ICD-10-CM | POA: Diagnosis not present

## 2017-06-23 LAB — GLUCOSE, CAPILLARY
GLUCOSE-CAPILLARY: 120 mg/dL — AB (ref 65–99)
GLUCOSE-CAPILLARY: 175 mg/dL — AB (ref 65–99)

## 2017-06-23 NOTE — Discharge Summary (Addendum)
Physician Discharge Summary  Daniel [name] JJK:[id number] DOB: [birth year] DOA: 06/16/2017  PCP: Jani Gravel, MD  Admit date: 06/16/2017 Discharge date: 06/23/2017  Admitted From: Home Disposition: SNF   Recommendations for Outpatient Follow-up:  1. Follow up with PCP in 1-2 weeks 2. Follow up with neurology in 4-6 weeks. 3. Please monitor BP, if becomes elevated, consider restarting ARB which is held due to hypotension.  Home Health: N/A Equipment/Devices: Per SNF Discharge Condition: Stable CODE STATUS: DNR Diet recommendation: Heart healthy, carb-modified   Brief/Interim Summary: Mr. Pucillo is n25 yo M with T2DM, history of chronic HFrEF (most recent EF 55%) and CAD who presented with acute manual clumsiness followed by right-sided weakness and slurred speech. His wife had taken his blood sugar earlier and it was 112. EMS was called and he had a generalized seizure in route he has been intubated and is now mechanically ventilated. He was worked up as a code stroke with a negative head CT and an MRI that does not show any acute infarct there is a small area of hemorrhage near the left vertex. CTA showed no large vessel occlusion there is bilateral carotid bifurcation stenosis and the vertebrals are stenotic at their origins as well. He has had one seizure in his life previously at a time where he was apparently unstable and had 2 coronary stents placed. Neurology was consulted.Keppra was given and he has had no further seizure activity while admitted. Troponin level was mildly elevated with flat trend though echocardiogram demonstrated no wall motion abnormalities and he had no symptoms of ACS. Cardiology was consulted, recommending no further work up.   Discharge Diagnoses:  Active Problems:   Seizure ([place])  Seizure MRI normal, no infarct or mass or hemorrhage.  EEG showed no epileptiform activity, but abnormal focus suggesting risk for future strokes. Was restarted on  Keppra. -Continue Keppra 750mg  po BID -Needs f/u with [facility] in 4-6 weeks at the time of discharge  Acute hypoxic respiratory failure Secondary to seizure post-ictal state only, for airway protection.  Intubated 3/27, extubated without event on 3/28.  No further respiratory concerns.  Coronary disease Hypertension Elevated troponin Troponin elevation low and flat, initial echocardiogram shows reduced EF, regional wall motion abnormality.  (He has a history of NSTEMI in 2004 in [place], got LAD and RCA stents at that time, incidentally, this event was in the context of a seizure).  Now follows with Dr. Abelina Bachelor, had regained normal EF at last echo.  Here, echo was repeated with Definity, showed normal EF, no RWMA.  Cardiology were consulted, agreed that no further ischemic work up was warranted. -Home losartan on hold due to soft BP here and preserved EF -Continue aspirin, statin -Follow up with Dr. Sallyanne Kuster in 4 weeks.  Delirium Post-extubation he had persistent confusion/disorientation/delirium, which was thought to be a - Limit provocative medications  Hypomagnesemia Repleted  Aspiration pneumonia Started on antibiotics due to equivocal CXR after intubation.  Pro-calcitonin undetectable, sputum culture normal respiratory flora.  Completed 5d Augmentin.  T2DM:  - Holding long-acting insulin due to relative hypoglycemia. Recommend checking CBG AC and sliding scale insulin if glucose is elevated.   Chronic kidney disease stage III: Stable, at baseline.   BPH: No current urinary retention. -Continue finasteride  Depression -Continue SSRI  Discharge Instructions Discharge Instructions    Ambulatory referral to Neurology   Complete by:  As directed    An appointment is requested in approximately: 4 weeks  For seizure   Diet - low  sodium heart healthy   Complete by:  As directed    Diet Carb Modified   Complete by:  As directed    Discharge instructions   Complete  by:  As directed    Increase activity slowly   Complete by:  As directed      Allergies as of 06/23/2017   No Known Allergies     Medication List    STOP taking these medications   losartan 50 MG tablet Commonly known as:  COZAAR   NOVOLOG FLEXPEN 100 UNIT/ML FlexPen Generic drug:  insulin aspart Replaced by:  insulin aspart 100 UNIT/ML injection   TRESIBA FLEXTOUCH 200 UNIT/ML Sopn Generic drug:  Insulin Degludec     TAKE these medications   aspirin 81 MG tablet Take 81 mg by mouth daily.   citalopram 10 MG tablet Commonly known as:  CELEXA Take 10 mg by mouth daily.   finasteride 5 MG tablet Commonly known as:  PROSCAR Take 5 mg by mouth daily.   hydrocortisone 2.5 % cream Apply 1 application topically as needed (face).   insulin aspart 100 UNIT/ML injection Commonly known as:  novoLOG Inject 0-9 Units into the skin 3 (three) times daily with meals. Replaces:  NOVOLOG FLEXPEN 100 UNIT/ML FlexPen   latanoprost 0.005 % ophthalmic solution Commonly known as:  XALATAN Place 1 drop into both eyes at bedtime.   levETIRAcetam 100 MG/ML solution Commonly known as:  KEPPRA Take 7.5 mLs (750 mg total) by mouth 2 (two) times daily.   multivitamin tablet Take 1 tablet by mouth daily.   rosuvastatin 20 MG tablet Commonly known as:  CRESTOR Take 10 mg by mouth daily.   SUPER B COMPLEX PO Take 1 tablet by mouth daily.   vitamin C 500 MG tablet Commonly known as:  ASCORBIC ACID Take 500 mg by mouth daily.      Contact information for after-discharge care    Destination    [facility] .   Service:  Skilled Nursing Contact information: [place] [phone]             No Known Allergies  Consultations:  Cardiology  Neurology  Critical care medicine  Procedures/Studies: Ct Angio Head W Or Wo Contrast  Result Date: 06/16/2017 CLINICAL DATA:  Right-sided weakness.  Weakness prior to seizure.  EXAM: CT ANGIOGRAPHY HEAD AND NECK TECHNIQUE: Multidetector CT imaging of the head and neck was performed using the standard protocol during bolus administration of intravenous contrast. Multiplanar CT image reconstructions and MIPs were obtained to evaluate the vascular anatomy. Carotid stenosis measurements (when applicable) are obtained utilizing NASCET criteria, using the distal internal carotid diameter as the denominator. CONTRAST:  72mL ISOVUE-370 IOPAMIDOL (ISOVUE-370) INJECTION 76% COMPARISON:  CT head without contrast from the same day. FINDINGS: CTA NECK FINDINGS Aortic arch: There is a common origin of the left common carotid artery in the innominate artery. Atherosclerotic changes are noted at the origin the great vessels and in the arch without aneurysm or significant focal stenosis. Right carotid system: Atherosclerotic calcifications are noted along the course of the right common carotid artery without a significant luminal stenosis proximal to the bifurcation. There is a high-grade stenosis at the bifurcation with narrowing of the lumen between calcified plaque to less than 1 mm. This constitutes a greater than 70% stenosis relative to the more distal vessel. No other tandem stenosis is present in the cervical right ICA. There is some tortuosity just below the skull base  without focal stenosis. Left carotid system: The left common carotid artery demonstrates distal mural calcification without significant stenosis proximal to the bifurcation. There is dense calcified plaque at the bifurcation which narrows the lumen to approximately 1 mm. The more distal vessel measures 4.5 mm. This constitutes a severe stenosis of greater than 70% relative to the more distal vessel. No tandem stenosis is present in the cervical left ICA. There is tortuosity just proximal skull base. Vertebral arteries: Dense calcifications are present at the origin of the vertebral arteries from the subclavian arteries bilaterally.  The left vertebral artery is slightly dominant to the right. No other tandem stenoses are present in the neck. Skeleton: Multilevel degenerative changes are present in the cervical spine. There is chronic loss of disc height at all levels from C3-4 through C6-7. Slight retrolisthesis is present at C3-4 and C4-5. Uncovertebral spurring contributes to foraminal narrowing greatest at C3-4 and C6-7. No focal lytic or blastic lesions are present. Other neck: The patient is intubated. The endotracheal tube terminates only 2.5 cm above the carina and could be pulled back 1-2 cm for more optimal positioning. NG tube is in a dilated esophagus. Thyroid is unremarkable. No significant adenopathy is present. Salivary glands are within normal limits. Upper chest: The lung apices demonstrate mild dependent atelectasis. No focal nodule or mass lesion is present. Review of the MIP images confirms the above findings CTA HEAD FINDINGS Anterior circulation: Atherosclerotic calcifications result in moderate stenoses of the cavernous internal carotid arteries bilaterally. The ICA termini are normal. The A1 and M1 segments are normal. The anterior communicating artery is patent. MCA bifurcations are intact. ACA and MCA branch vessels are within normal limits. Posterior circulation: Below the dura. The left PICA originates above the dura. The vertebrobasilar junction is normal. The left posterior cerebral artery is of fetal type. The right posterior cerebral artery originates from the basilar tip. PCA branch vessels are within normal limits bilaterally. Venous sinuses: The dural sinuses are patent. The right transverse sinus is dominant. The straight sinus and deep cerebral veins are intact. Cortical veins are unremarkable. Anatomic variants: Fetal type left posterior cerebral artery. Review of the MIP images confirms the above findings IMPRESSION: 1. No emergent large vessel occlusion. 2. High-grade stenosis at the carotid bifurcations  bilaterally. 3. Moderate stenoses within the cavernous internal carotid arteries bilaterally. 4. High-grade stenosis at the origin of the vertebral arteries bilaterally. 5. Fetal type left posterior cerebral artery. 6. Normal variant circle of Willis without significant proximal stenosis, aneurysm, or branch vessel occlusion above the supraclinoid segments. 7. Multilevel spondylosis of the cervical spine as described. Foraminal disease is greatest at C3-4 and C6-7. 8. The endotracheal tube terminates only 2.5 cm above the carina and could be pulled back 1-2 cm for more optimal positioning. 9.  Aortic Atherosclerosis (ICD10-I70.0). Electronically Signed   By: San Morelle M.D.   On: 06/16/2017 13:58   Ct Angio Neck W Or Wo Contrast  Result Date: 06/16/2017 CLINICAL DATA:  Right-sided weakness.  Weakness prior to seizure. EXAM: CT ANGIOGRAPHY HEAD AND NECK TECHNIQUE: Multidetector CT imaging of the head and neck was performed using the standard protocol during bolus administration of intravenous contrast. Multiplanar CT image reconstructions and MIPs were obtained to evaluate the vascular anatomy. Carotid stenosis measurements (when applicable) are obtained utilizing NASCET criteria, using the distal internal carotid diameter as the denominator. CONTRAST:  29mL ISOVUE-370 IOPAMIDOL (ISOVUE-370) INJECTION 76% COMPARISON:  CT head without contrast from the same day. FINDINGS: CTA NECK  FINDINGS Aortic arch: There is a common origin of the left common carotid artery in the innominate artery. Atherosclerotic changes are noted at the origin the great vessels and in the arch without aneurysm or significant focal stenosis. Right carotid system: Atherosclerotic calcifications are noted along the course of the right common carotid artery without a significant luminal stenosis proximal to the bifurcation. There is a high-grade stenosis at the bifurcation with narrowing of the lumen between calcified plaque to less  than 1 mm. This constitutes a greater than 70% stenosis relative to the more distal vessel. No other tandem stenosis is present in the cervical right ICA. There is some tortuosity just below the skull base without focal stenosis. Left carotid system: The left common carotid artery demonstrates distal mural calcification without significant stenosis proximal to the bifurcation. There is dense calcified plaque at the bifurcation which narrows the lumen to approximately 1 mm. The more distal vessel measures 4.5 mm. This constitutes a severe stenosis of greater than 70% relative to the more distal vessel. No tandem stenosis is present in the cervical left ICA. There is tortuosity just proximal skull base. Vertebral arteries: Dense calcifications are present at the origin of the vertebral arteries from the subclavian arteries bilaterally. The left vertebral artery is slightly dominant to the right. No other tandem stenoses are present in the neck. Skeleton: Multilevel degenerative changes are present in the cervical spine. There is chronic loss of disc height at all levels from C3-4 through C6-7. Slight retrolisthesis is present at C3-4 and C4-5. Uncovertebral spurring contributes to foraminal narrowing greatest at C3-4 and C6-7. No focal lytic or blastic lesions are present. Other neck: The patient is intubated. The endotracheal tube terminates only 2.5 cm above the carina and could be pulled back 1-2 cm for more optimal positioning. NG tube is in a dilated esophagus. Thyroid is unremarkable. No significant adenopathy is present. Salivary glands are within normal limits. Upper chest: The lung apices demonstrate mild dependent atelectasis. No focal nodule or mass lesion is present. Review of the MIP images confirms the above findings CTA HEAD FINDINGS Anterior circulation: Atherosclerotic calcifications result in moderate stenoses of the cavernous internal carotid arteries bilaterally. The ICA termini are normal. The A1  and M1 segments are normal. The anterior communicating artery is patent. MCA bifurcations are intact. ACA and MCA branch vessels are within normal limits. Posterior circulation: Below the dura. The left PICA originates above the dura. The vertebrobasilar junction is normal. The left posterior cerebral artery is of fetal type. The right posterior cerebral artery originates from the basilar tip. PCA branch vessels are within normal limits bilaterally. Venous sinuses: The dural sinuses are patent. The right transverse sinus is dominant. The straight sinus and deep cerebral veins are intact. Cortical veins are unremarkable. Anatomic variants: Fetal type left posterior cerebral artery. Review of the MIP images confirms the above findings IMPRESSION: 1. No emergent large vessel occlusion. 2. High-grade stenosis at the carotid bifurcations bilaterally. 3. Moderate stenoses within the cavernous internal carotid arteries bilaterally. 4. High-grade stenosis at the origin of the vertebral arteries bilaterally. 5. Fetal type left posterior cerebral artery. 6. Normal variant circle of Willis without significant proximal stenosis, aneurysm, or branch vessel occlusion above the supraclinoid segments. 7. Multilevel spondylosis of the cervical spine as described. Foraminal disease is greatest at C3-4 and C6-7. 8. The endotracheal tube terminates only 2.5 cm above the carina and could be pulled back 1-2 cm for more optimal positioning. 9.  Aortic Atherosclerosis (ICD10-I70.0).  Electronically Signed   By: San Morelle M.D.   On: 06/16/2017 13:58   Mr Brain Wo Contrast  Result Date: 06/16/2017 CLINICAL DATA:  Unresponsive.  Suspected stroke. EXAM: MRI HEAD WITHOUT CONTRAST TECHNIQUE: Multiplanar, multiecho pulse sequences of the brain and surrounding structures were obtained without intravenous contrast. COMPARISON:  CT head earlier today. FINDINGS: Brain: No evidence for acute infarction, hemorrhage, mass lesion,  hydrocephalus, or extra-axial fluid. Generalized atrophy. Mild to moderate T2 and FLAIR hyperintensities in the brain, largely confluent, also affecting the pons, consistent with small vessel disease. Vascular: Flow voids are maintained. Tiny focus of chronic hemorrhage near the vertex on the LEFT. Skull and upper cervical spine: Unremarkable visualized calvarium, skullbase, and cervical vertebrae. Pituitary, pineal, cerebellar tonsils unremarkable. No upper cervical cord lesions. Mild cervical spondylosis. Sinuses/Orbits: Significant fluid accumulation in the LEFT division sphenoid sinus. BILATERAL ethmoid sinus mucosal thickening. Previous ocular surgery. LEFT scleral banding. Other: None. IMPRESSION: Atrophy and small vessel disease. No acute intracranial findings. No acute stroke. Electronically Signed   By: Staci Righter M.D.   On: 06/16/2017 14:56   Dg Chest Portable 1 View  Result Date: 06/16/2017 CLINICAL DATA:  Hypoxia EXAM: PORTABLE CHEST 1 VIEW COMPARISON:  August 26, 2009 FINDINGS: Endotracheal tube tip is 3.7 cm above the carina. Nasogastric tube tip and side port are below the diaphragm. No pneumothorax. There is airspace consolidation in the left lower lobe with small left pleural effusion. There is mild right base atelectasis. Lungs elsewhere clear. Heart is enlarged with pulmonary vascularity within normal limits. No adenopathy. There is aortic atherosclerosis. No evident bone lesions. IMPRESSION: Tube positions as described without pneumothorax. Left base consolidation consistent with pneumonia. Small left pleural effusion. Mild right base atelectasis. Stable cardiomegaly.  Aortic atherosclerosis evident. Aortic Atherosclerosis (ICD10-I70.0). Electronically Signed   By: Lowella Grip III M.D.   On: 06/16/2017 13:18   Ct Head Code Stroke Wo Contrast  Result Date: 06/16/2017 CLINICAL DATA:  Code stroke. Focal neuro deficit. Right-sided weakness EXAM: CT HEAD WITHOUT CONTRAST TECHNIQUE:  Contiguous axial images were obtained from the base of the skull through the vertex without intravenous contrast. COMPARISON:  05/08/2017 FINDINGS: Brain: No evidence of acute infarction, hemorrhage, hydrocephalus, extra-axial collection or mass lesion/mass effect. Generalized atrophy that is advanced. Mild chronic microvascular ischemic change in the periventricular white matter. Vascular: Atherosclerotic calcification. Skull: No acute or aggressive finding. Sinuses/Orbits: Chronic left sphenoid sinusitis when compared to prior. Sequela of right maxillary chronic sinusitis with improved aeration since 2011 brain MRI. Bilateral cataract resection. Left scleral banding. Other: These results were communicated to Dr. Lorraine Lax at 1:25 pmon 3/27/2019by text page via the [facility] messaging system. ASPECTS [facility] Stroke Program Early CT Score) -left hemisphere - Ganglionic level infarction (caudate, lentiform nuclei, internal capsule, insula, M1-M3 cortex): 7 - Supraganglionic infarction (M4-M6 cortex): 3 Total score (0-10 with 10 being normal): 10 IMPRESSION: 1. No acute finding or change from 05/08/2017. 2. Generalized atrophy. 3. Chronic left sphenoid sinusitis. Electronically Signed   By: Monte Fantasia M.D.   On: 06/16/2017 13:26    ETT 3/27 - 3/28  Echocardiogram 3/29 Study Conclusions  - Left ventricle: The cavity size was normal. Wall thickness was increased in a pattern of mild LVH. Systolic function was mildly reduced. The estimated ejection fraction was in the range of 45% to 50%. Basal to mid inferior wall hypokinesis. The study is not technically sufficient to allow evaluation of LV diastolic function. Ejection fraction (MOD, 2-plane): 50%. - Aortic valve: Sclerosis without  stenosis. There was mild regurgitation. - Mitral valve: Calcified annulus. Mildly thickened leaflets . Moderate regurgitation. - Left atrium: Severely dilated. - Tricuspid valve: There was mild  regurgitation. - Pulmonary arteries: PA peak pressure: 57 mm Hg (S). - Inferior vena cava: The vessel was dilated. The respirophasic diameter changes were blunted (<50%), consistent with elevated central venous pressure.  Impressions:  - Compared to a prior study in 2016, the LVEF is reduced to around 45-50% with basal to mid inferior wall hypokinesis.  Echocardiogram 3/30 With definity, no RWMA or reduced EF.   Subjective: Feels well, breathing normally without dyspnea, chest pain. No numbness, focal weakness or tremor. Aggravated by delay by insurance for SNF placement.   Discharge Exam: Vitals:   06/23/17 0700 06/23/17 1144  BP: 124/65 (!) 118/56  Pulse: 68 70  Resp: 16 16  Temp: 98 F (36.7 C) 98 F (36.7 C)  SpO2: 99% 98%   General: Pt is alert, awake, not in acute distress Cardiovascular: RRR, S1/S2 +, no rubs, no gallops Respiratory: CTA bilaterally, no wheezing, no rhonchi Abdominal: Soft, NT, ND, bowel sounds + Extremities: No edema, no cyanosis Neuro: Alert and oriented, no focal deficits.   Labs: BNP (last 3 results) No results for input(s): BNP in the last 8760 hours. Basic Metabolic Panel: Recent Labs  Lab 06/17/17 0359 06/18/17 0759 06/19/17 0609 06/20/17 0316  NA 135 134*  --   --   K 4.8 4.1  --   --   CL 105 106  --   --   CO2 19* 21*  --   --   GLUCOSE 111* 104*  --   --   BUN 33* 36*  --   --   CREATININE 1.51* 1.59*  --   --   CALCIUM 8.5* 8.4*  --   --   MG 1.9 1.9 1.6* 1.5*  PHOS  --  3.3 3.0 2.8   Liver Function Tests: Recent Labs  Lab 06/17/17 0359  AST 36  ALT 17  ALKPHOS 43  BILITOT 1.1  PROT 6.0*  ALBUMIN 3.1*   No results for input(s): LIPASE, AMYLASE in the last 168 hours. No results for input(s): AMMONIA in the last 168 hours. CBC: Recent Labs  Lab 06/17/17 0359 06/18/17 0759 06/19/17 0609 06/20/17 0316  WBC 13.2* 11.5* 10.9* 11.2*  NEUTROABS 8.8* 6.9 6.9 6.4  HGB 12.1* 10.7* 10.6* 11.1*  HCT 35.6*  31.8* 31.7* 32.2*  MCV 92.7 93.8 93.2 92.3  PLT 129* 115* 126* 137*   Cardiac Enzymes: Recent Labs  Lab 06/16/17 1702 06/16/17 2328 06/17/17 0359  TROPONINI 0.06* 0.09* 0.10*   BNP: Invalid input(s): POCBNP CBG: Recent Labs  Lab 06/22/17 1117 06/22/17 1628 06/22/17 2151 06/23/17 0644 06/23/17 1140  GLUCAP 176* 150* 146* 120* 175*   D-Dimer No results for input(s): DDIMER in the last 72 hours. Hgb A1c No results for input(s): HGBA1C in the last 72 hours. Lipid Profile No results for input(s): CHOL, HDL, LDLCALC, TRIG, CHOLHDL, LDLDIRECT in the last 72 hours. Thyroid function studies No results for input(s): TSH, T4TOTAL, T3FREE, THYROIDAB in the last 72 hours.  Invalid input(s): FREET3 Anemia work up No results for input(s): VITAMINB12, FOLATE, FERRITIN, TIBC, IRON, RETICCTPCT in the last 72 hours. Urinalysis    Component Value Date/Time   COLORURINE YELLOW 06/17/2017 0053   APPEARANCEUR CLEAR 06/17/2017 0053   LABSPEC 1.027 06/17/2017 0053   PHURINE 6.0 06/17/2017 0053   GLUCOSEU NEGATIVE 06/17/2017 0053   HGBUR NEGATIVE 06/17/2017  [place] 06/17/2017 [place] 06/17/2017 0053   PROTEINUR 100 (A) 06/17/2017 0053   UROBILINOGEN 1.0 04/17/2013 1014   NITRITE NEGATIVE 06/17/2017 0053   LEUKOCYTESUR NEGATIVE 06/17/2017 0053    Microbiology Recent Results (from the past 240 hour(s))  MRSA PCR Screening     Status: None   Collection Time: 06/16/17  7:25 PM  Result Value Ref Range Status   MRSA by PCR NEGATIVE NEGATIVE Final    Comment:        The GeneXpert MRSA Assay (FDA approved for NASAL specimens only), is one component of a comprehensive MRSA colonization surveillance program. It is not intended to diagnose MRSA infection nor to guide or monitor treatment for MRSA infections. Performed at [facility], [place] [address], [place], [place] [address]   Culture, respiratory (NON-Expectorated)     Status: None    Collection Time: 06/16/17 10:07 PM  Result Value Ref Range Status   Specimen Description TRACHEAL ASPIRATE  Final   Special Requests Normal  Final   Gram Stain   Final    FEW WBC PRESENT, PREDOMINANTLY PMN MODERATE GRAM POSITIVE COCCI IN PAIRS FEW GRAM NEGATIVE RODS FEW GRAM NEGATIVE DIPLOCOCCI RARE GRAM POSITIVE RODS    Culture   Final    FEW Consistent with normal respiratory flora. Performed at [facility], [place] [address] [place], [place] [address]    Report Status 06/19/2017 FINAL  Final  Culture, Urine     Status: None   Collection Time: 06/17/17 12:52 AM  Result Value Ref Range Status   Specimen Description URINE, CLEAN CATCH  Final   Special Requests Normal  Final   Culture   Final    NO GROWTH Performed at [facility], [place] [address], [place], [place] [address]    Report Status 06/18/2017 FINAL  Final    Time coordinating discharge: Approximately 40 minutes  Vance Gather, MD  [facility] 06/23/2017, 1:28 PM Pager [phone]

## 2017-06-23 NOTE — Progress Notes (Addendum)
Physical Therapy Treatment Patient Details Name: Daniel [name] MRN: [id number] DOB: 1933/12/21 Today's Date: 06/23/2017    History of Present Illness This is an 84 year old male presenting with altered mental status, seizure, right-sided weakness and slurred speech.  Video-EEG monitoring was performed to evaluate for seizures.pmhx of CAD, HFrEF, IDDM, CKD, tonic clonic seizure 15 years ago, and prior alcohol abuse (sober 4 years).     PT Comments    Patient is progressing very well towards their physical therapy goals. Patient deferred longer distance ambulation at this time. Therefore, session focused on transferring from bed to chair and practicing sit to stand transfers in order to increase leg strength and power and promote upright posture and balance. Patient able to progress to requiring min assist to power up for sit to stand and demonstrated improved descending control with stand to sit by end of session. Prefers to wear shoes when transferring and ambulating to prevent feet from sliding. Goals updated.   Follow Up Recommendations  SNF     Equipment Recommendations  None recommended by PT    Recommendations for Other Services       Precautions / Restrictions Precautions Precautions: Fall Restrictions Weight Bearing Restrictions: No    Mobility  Bed Mobility Overal bed mobility: Needs Assistance Bed Mobility: Supine to Sit;Sit to Supine     Supine to sit: HOB elevated;Supervision     General bed mobility comments: Supervision for safety with HOB significantly elevated. Increased time and effort required  Transfers Overall transfer level: Needs assistance Equipment used: Rolling walker (2 wheeled) Transfers: Sit to/from Stand Sit to Stand: Mod assist;From elevated surface;Min assist         General transfer comment: Min-mod assist to stand to RW. Needs assistance initially to help power up but then is able to achieve upright posture with no assistance.  Decreased posterior lean noted this session. R foot blocked to prevent anterior slide. On subsequent sit to stand trials, utilized shoes to prevent feet from sliding. Vc's for hand placement, controlled descent with stand to sit and scooting forward to edge of seat.   Ambulation/Gait Ambulation/Gait assistance: Min assist Ambulation Distance (Feet): 5 Feet Assistive device: Rolling walker (2 wheeled) Gait Pattern/deviations: Step-to pattern;Wide base of support Gait velocity: decreased Gait velocity interpretation: Below normal speed for age/gender General Gait Details: Ambulated from bed to chair with min assist. Very wide BOS with decreased stride length and poor proximity to RW; not able to correct with verbal cueing.    Stairs            Wheelchair Mobility    Modified Rankin (Stroke Patients Only)       Balance Overall balance assessment: Needs assistance Sitting-balance support: Feet supported;No upper extremity supported Sitting balance-Leahy Scale: Good     Standing balance support: Bilateral upper extremity supported Standing balance-Leahy Scale: Poor Standing balance comment: dependent on bilat UE support                            Cognition Arousal/Alertness: Awake/alert Behavior During Therapy: WFL for tasks assessed/performed Overall Cognitive Status: No family/caregiver present to determine baseline cognitive functioning                                 General Comments: Patient oriented to self and place, disoriented to time, stating the year is 2009.       Exercises  General Comments        Pertinent Vitals/Pain Pain Assessment: No/denies pain    Home Living                      Prior Function            PT Goals (current goals can now be found in the care plan section) Acute Rehab PT Goals Patient Stated Goal: to get better PT Goal Formulation: With patient Time For Goal Achievement:  07/01/17 Potential to Achieve Goals: Fair Progress towards PT goals: Progressing toward goals    Frequency    Min 2X/week      PT Plan      Co-evaluation              AM-PAC PT "6 Clicks" Daily Activity  Outcome Measure  Difficulty turning over in bed (including adjusting bedclothes, sheets and blankets)?: A Lot Difficulty moving from lying on back to sitting on the side of the bed? : A Lot Difficulty sitting down on and standing up from a chair with arms (e.g., wheelchair, bedside commode, etc,.)?: Unable Help needed moving to and from a bed to chair (including a wheelchair)?: A Little Help needed walking in hospital room?: A Little Help needed climbing 3-5 steps with a railing? : Total 6 Click Score: 12    End of Session Equipment Utilized During Treatment: Gait belt Activity Tolerance: Patient tolerated treatment well Patient left: with call bell/phone within reach;in chair;with chair alarm set   PT Visit Diagnosis: Unsteadiness on feet (R26.81);Muscle weakness (generalized) (M62.81)     Time: 1601-0932 PT Time Calculation (min) (ACUTE ONLY): 25 min  Charges:  $Therapeutic Activity: 23-37 mins                    G Codes:       Daniel [name], PT, DPT Acute Rehabilitation Services  Pager: [phone]    Willy Eddy 06/23/2017, 10:55 AM

## 2017-06-23 NOTE — Progress Notes (Addendum)
RN called report to [facility]. Discharge instrucgtions and handout given. PTAR transport to [facility]. No new questions or concerns.   IV and telemetry removed

## 2017-06-23 NOTE — Clinical Social Work Placement (Signed)
Nurse to call report to [phone], Room 1008P    CLINICAL SOCIAL WORK PLACEMENT  NOTE  Date:  06/23/2017  Patient Details  Name: Daniel [name] MRN: [id number] Date of Birth: 1933-09-04  Clinical Social Work is seeking post-discharge placement for this patient at the [place] level of care (*CSW will initial, date and re-position this form in  chart as items are completed):  Yes   Patient/family provided with [place] Work Department's list of facilities offering this level of care within the geographic area requested by the patient (or if unable, by the patient's family).  Yes   Patient/family informed of their freedom to choose among providers that offer the needed level of care, that participate in Medicare, Medicaid or managed care program needed by the patient, have an available bed and are willing to accept the patient.  Yes   Patient/family informed of 's ownership interest in [facility] and [facility], as well as of the fact that they are under no obligation to receive care at these facilities.  PASRR submitted to EDS on       PASRR number received on 06/18/17     Existing PASRR number confirmed on       FL2 transmitted to all facilities in geographic area requested by pt/family on 06/18/17     FL2 transmitted to all facilities within larger geographic area on       Patient informed that his/her managed care company has contracts with or will negotiate with certain facilities, including the following:        Yes   Patient/family informed of bed offers received.  Patient chooses bed at [facility]     Physician recommends and patient chooses bed at      Patient to be transferred to [facility] on 06/23/17.  Patient to be transferred to facility by PTAR     Patient family notified on 06/23/17 of transfer.  Name of family member notified:  Ann     PHYSICIAN       Additional Comment:     _______________________________________________ Geralynn Ochs, LCSW 06/23/2017, 2:36 PM

## 2017-06-23 NOTE — Care Management Note (Signed)
Case Management Note  Patient Details  Name: Daniel [name] MRN: [id number] Date of Birth: 03-14-1934  Subjective/Objective:                    Action/Plan: Pt discharging to [facility]. CM signing off.  Expected Discharge Date:  06/23/17               Expected Discharge Plan:  Skilled Nursing Facility  In-House Referral:  Clinical Social Work  Discharge planning Services     Post Acute Care Choice:    Choice offered to:     DME Arranged:    DME Agency:     HH Arranged:    [place] Agency:     Status of Service:  Completed, signed off  If discussed at [employer], dates discussed:    Additional Comments:  Pollie Friar, RN 06/23/2017, 4:07 PM

## 2017-06-25 DIAGNOSIS — I2510 Atherosclerotic heart disease of native coronary artery without angina pectoris: Secondary | ICD-10-CM | POA: Diagnosis not present

## 2017-06-25 DIAGNOSIS — N189 Chronic kidney disease, unspecified: Secondary | ICD-10-CM | POA: Diagnosis not present

## 2017-06-25 DIAGNOSIS — Z9189 Other specified personal risk factors, not elsewhere classified: Secondary | ICD-10-CM | POA: Diagnosis not present

## 2017-06-25 DIAGNOSIS — R569 Unspecified convulsions: Secondary | ICD-10-CM | POA: Diagnosis not present

## 2017-06-29 DIAGNOSIS — I5022 Chronic systolic (congestive) heart failure: Secondary | ICD-10-CM | POA: Diagnosis not present

## 2017-06-29 DIAGNOSIS — I2510 Atherosclerotic heart disease of native coronary artery without angina pectoris: Secondary | ICD-10-CM | POA: Diagnosis not present

## 2017-06-29 DIAGNOSIS — N400 Enlarged prostate without lower urinary tract symptoms: Secondary | ICD-10-CM | POA: Diagnosis not present

## 2017-06-29 DIAGNOSIS — R69 Illness, unspecified: Secondary | ICD-10-CM | POA: Diagnosis not present

## 2017-06-29 DIAGNOSIS — N183 Chronic kidney disease, stage 3 (moderate): Secondary | ICD-10-CM | POA: Diagnosis not present

## 2017-06-29 DIAGNOSIS — R569 Unspecified convulsions: Secondary | ICD-10-CM | POA: Diagnosis not present

## 2017-06-29 DIAGNOSIS — I130 Hypertensive heart and chronic kidney disease with heart failure and stage 1 through stage 4 chronic kidney disease, or unspecified chronic kidney disease: Secondary | ICD-10-CM | POA: Diagnosis not present

## 2017-06-29 DIAGNOSIS — E1122 Type 2 diabetes mellitus with diabetic chronic kidney disease: Secondary | ICD-10-CM | POA: Diagnosis not present

## 2017-06-30 DIAGNOSIS — I130 Hypertensive heart and chronic kidney disease with heart failure and stage 1 through stage 4 chronic kidney disease, or unspecified chronic kidney disease: Secondary | ICD-10-CM | POA: Diagnosis not present

## 2017-06-30 DIAGNOSIS — R569 Unspecified convulsions: Secondary | ICD-10-CM | POA: Diagnosis not present

## 2017-06-30 DIAGNOSIS — Z7189 Other specified counseling: Secondary | ICD-10-CM | POA: Diagnosis not present

## 2017-06-30 DIAGNOSIS — R69 Illness, unspecified: Secondary | ICD-10-CM | POA: Diagnosis not present

## 2017-06-30 DIAGNOSIS — Z794 Long term (current) use of insulin: Secondary | ICD-10-CM | POA: Diagnosis not present

## 2017-06-30 DIAGNOSIS — I5022 Chronic systolic (congestive) heart failure: Secondary | ICD-10-CM | POA: Diagnosis not present

## 2017-06-30 DIAGNOSIS — I2510 Atherosclerotic heart disease of native coronary artery without angina pectoris: Secondary | ICD-10-CM | POA: Diagnosis not present

## 2017-06-30 DIAGNOSIS — E1165 Type 2 diabetes mellitus with hyperglycemia: Secondary | ICD-10-CM | POA: Diagnosis not present

## 2017-06-30 DIAGNOSIS — N183 Chronic kidney disease, stage 3 (moderate): Secondary | ICD-10-CM | POA: Diagnosis not present

## 2017-06-30 DIAGNOSIS — N400 Enlarged prostate without lower urinary tract symptoms: Secondary | ICD-10-CM | POA: Diagnosis not present

## 2017-06-30 DIAGNOSIS — E1122 Type 2 diabetes mellitus with diabetic chronic kidney disease: Secondary | ICD-10-CM | POA: Diagnosis not present

## 2017-07-01 DIAGNOSIS — N400 Enlarged prostate without lower urinary tract symptoms: Secondary | ICD-10-CM | POA: Diagnosis not present

## 2017-07-01 DIAGNOSIS — I2510 Atherosclerotic heart disease of native coronary artery without angina pectoris: Secondary | ICD-10-CM | POA: Diagnosis not present

## 2017-07-01 DIAGNOSIS — E1122 Type 2 diabetes mellitus with diabetic chronic kidney disease: Secondary | ICD-10-CM | POA: Diagnosis not present

## 2017-07-01 DIAGNOSIS — N183 Chronic kidney disease, stage 3 (moderate): Secondary | ICD-10-CM | POA: Diagnosis not present

## 2017-07-01 DIAGNOSIS — I130 Hypertensive heart and chronic kidney disease with heart failure and stage 1 through stage 4 chronic kidney disease, or unspecified chronic kidney disease: Secondary | ICD-10-CM | POA: Diagnosis not present

## 2017-07-01 DIAGNOSIS — R69 Illness, unspecified: Secondary | ICD-10-CM | POA: Diagnosis not present

## 2017-07-01 DIAGNOSIS — R569 Unspecified convulsions: Secondary | ICD-10-CM | POA: Diagnosis not present

## 2017-07-01 DIAGNOSIS — I5022 Chronic systolic (congestive) heart failure: Secondary | ICD-10-CM | POA: Diagnosis not present

## 2017-07-13 DIAGNOSIS — E119 Type 2 diabetes mellitus without complications: Secondary | ICD-10-CM | POA: Diagnosis not present

## 2017-07-13 DIAGNOSIS — Z961 Presence of intraocular lens: Secondary | ICD-10-CM | POA: Diagnosis not present

## 2017-07-13 DIAGNOSIS — H401233 Low-tension glaucoma, bilateral, severe stage: Secondary | ICD-10-CM | POA: Diagnosis not present

## 2017-07-14 DIAGNOSIS — R69 Illness, unspecified: Secondary | ICD-10-CM | POA: Diagnosis not present

## 2017-07-18 DIAGNOSIS — I950 Idiopathic hypotension: Secondary | ICD-10-CM | POA: Diagnosis not present

## 2017-07-18 DIAGNOSIS — E11649 Type 2 diabetes mellitus with hypoglycemia without coma: Secondary | ICD-10-CM | POA: Diagnosis not present

## 2017-07-18 DIAGNOSIS — E162 Hypoglycemia, unspecified: Secondary | ICD-10-CM | POA: Diagnosis not present

## 2017-07-18 DIAGNOSIS — N179 Acute kidney failure, unspecified: Secondary | ICD-10-CM | POA: Diagnosis not present

## 2017-07-18 DIAGNOSIS — E161 Other hypoglycemia: Secondary | ICD-10-CM | POA: Diagnosis not present

## 2017-07-19 ENCOUNTER — Emergency Department (HOSPITAL_COMMUNITY): Payer: Medicare HMO

## 2017-07-19 ENCOUNTER — Encounter (HOSPITAL_COMMUNITY): Payer: Self-pay

## 2017-07-19 ENCOUNTER — Other Ambulatory Visit: Payer: Self-pay

## 2017-07-19 ENCOUNTER — Observation Stay (HOSPITAL_COMMUNITY)
Admission: EM | Admit: 2017-07-19 | Discharge: 2017-07-20 | Disposition: A | Discharge status: Home / Self Care | Payer: Medicare HMO | Attending: Internal Medicine | Admitting: Internal Medicine

## 2017-07-19 DIAGNOSIS — D649 Anemia, unspecified: Secondary | ICD-10-CM | POA: Diagnosis present

## 2017-07-19 DIAGNOSIS — I5022 Chronic systolic (congestive) heart failure: Secondary | ICD-10-CM | POA: Diagnosis not present

## 2017-07-19 DIAGNOSIS — N183 Chronic kidney disease, stage 3 unspecified: Secondary | ICD-10-CM

## 2017-07-19 DIAGNOSIS — Q828 Other specified congenital malformations of skin: Secondary | ICD-10-CM | POA: Diagnosis not present

## 2017-07-19 DIAGNOSIS — I429 Cardiomyopathy, unspecified: Secondary | ICD-10-CM | POA: Insufficient documentation

## 2017-07-19 DIAGNOSIS — R748 Abnormal levels of other serum enzymes: Secondary | ICD-10-CM | POA: Insufficient documentation

## 2017-07-19 DIAGNOSIS — E11649 Type 2 diabetes mellitus with hypoglycemia without coma: Secondary | ICD-10-CM | POA: Diagnosis not present

## 2017-07-19 DIAGNOSIS — R740 Nonspecific elevation of levels of transaminase and lactic acid dehydrogenase [LDH]: Secondary | ICD-10-CM | POA: Insufficient documentation

## 2017-07-19 DIAGNOSIS — Z833 Family history of diabetes mellitus: Secondary | ICD-10-CM | POA: Insufficient documentation

## 2017-07-19 DIAGNOSIS — I2510 Atherosclerotic heart disease of native coronary artery without angina pectoris: Secondary | ICD-10-CM | POA: Diagnosis present

## 2017-07-19 DIAGNOSIS — E860 Dehydration: Secondary | ICD-10-CM | POA: Insufficient documentation

## 2017-07-19 DIAGNOSIS — E1129 Type 2 diabetes mellitus with other diabetic kidney complication: Secondary | ICD-10-CM

## 2017-07-19 DIAGNOSIS — R569 Unspecified convulsions: Secondary | ICD-10-CM | POA: Diagnosis not present

## 2017-07-19 DIAGNOSIS — W1839XA Other fall on same level, initial encounter: Secondary | ICD-10-CM | POA: Insufficient documentation

## 2017-07-19 DIAGNOSIS — K449 Diaphragmatic hernia without obstruction or gangrene: Secondary | ICD-10-CM | POA: Diagnosis not present

## 2017-07-19 DIAGNOSIS — D631 Anemia in chronic kidney disease: Secondary | ICD-10-CM | POA: Insufficient documentation

## 2017-07-19 DIAGNOSIS — Z794 Long term (current) use of insulin: Secondary | ICD-10-CM | POA: Diagnosis not present

## 2017-07-19 DIAGNOSIS — D696 Thrombocytopenia, unspecified: Secondary | ICD-10-CM

## 2017-07-19 DIAGNOSIS — E785 Hyperlipidemia, unspecified: Secondary | ICD-10-CM | POA: Insufficient documentation

## 2017-07-19 DIAGNOSIS — I130 Hypertensive heart and chronic kidney disease with heart failure and stage 1 through stage 4 chronic kidney disease, or unspecified chronic kidney disease: Secondary | ICD-10-CM | POA: Diagnosis not present

## 2017-07-19 DIAGNOSIS — I700 Atherosclerosis of aorta: Secondary | ICD-10-CM | POA: Insufficient documentation

## 2017-07-19 DIAGNOSIS — Z9841 Cataract extraction status, right eye: Secondary | ICD-10-CM | POA: Diagnosis not present

## 2017-07-19 DIAGNOSIS — E1140 Type 2 diabetes mellitus with diabetic neuropathy, unspecified: Secondary | ICD-10-CM | POA: Insufficient documentation

## 2017-07-19 DIAGNOSIS — Z8249 Family history of ischemic heart disease and other diseases of the circulatory system: Secondary | ICD-10-CM | POA: Insufficient documentation

## 2017-07-19 DIAGNOSIS — E1122 Type 2 diabetes mellitus with diabetic chronic kidney disease: Secondary | ICD-10-CM | POA: Diagnosis not present

## 2017-07-19 DIAGNOSIS — Z7982 Long term (current) use of aspirin: Secondary | ICD-10-CM | POA: Insufficient documentation

## 2017-07-19 DIAGNOSIS — Z9842 Cataract extraction status, left eye: Secondary | ICD-10-CM | POA: Insufficient documentation

## 2017-07-19 DIAGNOSIS — I472 Ventricular tachycardia: Secondary | ICD-10-CM | POA: Diagnosis not present

## 2017-07-19 DIAGNOSIS — Y9301 Activity, walking, marching and hiking: Secondary | ICD-10-CM | POA: Insufficient documentation

## 2017-07-19 DIAGNOSIS — S0990XA Unspecified injury of head, initial encounter: Secondary | ICD-10-CM | POA: Diagnosis not present

## 2017-07-19 DIAGNOSIS — E162 Hypoglycemia, unspecified: Secondary | ICD-10-CM | POA: Diagnosis not present

## 2017-07-19 DIAGNOSIS — S299XXA Unspecified injury of thorax, initial encounter: Secondary | ICD-10-CM | POA: Diagnosis not present

## 2017-07-19 DIAGNOSIS — F1020 Alcohol dependence, uncomplicated: Secondary | ICD-10-CM | POA: Diagnosis not present

## 2017-07-19 DIAGNOSIS — I950 Idiopathic hypotension: Secondary | ICD-10-CM | POA: Diagnosis present

## 2017-07-19 DIAGNOSIS — Y92002 Bathroom of unspecified non-institutional (private) residence single-family (private) house as the place of occurrence of the external cause: Secondary | ICD-10-CM | POA: Diagnosis not present

## 2017-07-19 DIAGNOSIS — Z79899 Other long term (current) drug therapy: Secondary | ICD-10-CM | POA: Insufficient documentation

## 2017-07-19 DIAGNOSIS — Z955 Presence of coronary angioplasty implant and graft: Secondary | ICD-10-CM | POA: Insufficient documentation

## 2017-07-19 DIAGNOSIS — I959 Hypotension, unspecified: Secondary | ICD-10-CM | POA: Diagnosis present

## 2017-07-19 DIAGNOSIS — E119 Type 2 diabetes mellitus without complications: Secondary | ICD-10-CM

## 2017-07-19 DIAGNOSIS — N179 Acute kidney failure, unspecified: Secondary | ICD-10-CM | POA: Diagnosis present

## 2017-07-19 DIAGNOSIS — Z8546 Personal history of malignant neoplasm of prostate: Secondary | ICD-10-CM | POA: Insufficient documentation

## 2017-07-19 LAB — URINALYSIS, ROUTINE W REFLEX MICROSCOPIC
BILIRUBIN URINE: NEGATIVE
Bacteria, UA: NONE SEEN
Glucose, UA: NEGATIVE mg/dL
Hgb urine dipstick: NEGATIVE
KETONES UR: NEGATIVE mg/dL
Leukocytes, UA: NEGATIVE
Nitrite: NEGATIVE
Protein, ur: >=300 mg/dL — AB
SPECIFIC GRAVITY, URINE: 1.015 (ref 1.005–1.030)
pH: 5 (ref 5.0–8.0)

## 2017-07-19 LAB — COMPREHENSIVE METABOLIC PANEL
ALT: 15 U/L — AB (ref 17–63)
AST: 26 U/L (ref 15–41)
Albumin: 3.3 g/dL — ABNORMAL LOW (ref 3.5–5.0)
Alkaline Phosphatase: 51 U/L (ref 38–126)
Anion gap: 8 (ref 5–15)
BUN: 34 mg/dL — ABNORMAL HIGH (ref 6–20)
CHLORIDE: 107 mmol/L (ref 101–111)
CO2: 21 mmol/L — AB (ref 22–32)
CREATININE: 1.31 mg/dL — AB (ref 0.61–1.24)
Calcium: 8.6 mg/dL — ABNORMAL LOW (ref 8.9–10.3)
GFR calc non Af Amer: 49 mL/min — ABNORMAL LOW (ref >60)
GFR, EST AFRICAN AMERICAN: 57 mL/min — AB (ref >60)
Glucose, Bld: 120 mg/dL — ABNORMAL HIGH (ref 65–99)
Potassium: 3.9 mmol/L (ref 3.5–5.1)
SODIUM: 136 mmol/L (ref 135–145)
Total Bilirubin: 0.7 mg/dL (ref 0.3–1.2)
Total Protein: 6.8 g/dL (ref 6.5–8.1)

## 2017-07-19 LAB — I-STAT CHEM 8, ED
BUN: 38 mg/dL — AB (ref 6–20)
CALCIUM ION: 1.14 mmol/L — AB (ref 1.15–1.40)
Chloride: 105 mmol/L (ref 101–111)
Creatinine, Ser: 1.7 mg/dL — ABNORMAL HIGH (ref 0.61–1.24)
Glucose, Bld: 138 mg/dL — ABNORMAL HIGH (ref 65–99)
HCT: 37 % — ABNORMAL LOW (ref 39.0–52.0)
HEMOGLOBIN: 12.6 g/dL — AB (ref 13.0–17.0)
Potassium: 4.3 mmol/L (ref 3.5–5.1)
Sodium: 138 mmol/L (ref 135–145)
TCO2: 21 mmol/L — AB (ref 22–32)

## 2017-07-19 LAB — DIFFERENTIAL
BASOS PCT: 0 %
Basophils Absolute: 0 10*3/uL (ref 0.0–0.1)
Eosinophils Absolute: 0 10*3/uL (ref 0.0–0.7)
Eosinophils Relative: 0 %
Lymphocytes Relative: 14 %
Lymphs Abs: 1.6 10*3/uL (ref 0.7–4.0)
Monocytes Absolute: 1 10*3/uL (ref 0.1–1.0)
Monocytes Relative: 9 %
NEUTROS ABS: 9.3 10*3/uL — AB (ref 1.7–7.7)
Neutrophils Relative %: 77 %

## 2017-07-19 LAB — CBC
HCT: 36.5 % — ABNORMAL LOW (ref 39.0–52.0)
Hemoglobin: 12.6 g/dL — ABNORMAL LOW (ref 13.0–17.0)
MCH: 32.3 pg (ref 26.0–34.0)
MCHC: 34.5 g/dL (ref 30.0–36.0)
MCV: 93.6 fL (ref 78.0–100.0)
PLATELETS: 137 10*3/uL — AB (ref 150–400)
RBC: 3.9 MIL/uL — ABNORMAL LOW (ref 4.22–5.81)
RDW: 12.6 % (ref 11.5–15.5)
WBC: 12 10*3/uL — AB (ref 4.0–10.5)

## 2017-07-19 LAB — TROPONIN I: Troponin I: 0.04 ng/mL (ref <0.03)

## 2017-07-19 LAB — TSH: TSH: 1.618 u[IU]/mL (ref 0.350–4.500)

## 2017-07-19 LAB — I-STAT TROPONIN, ED: Troponin i, poc: 0.04 ng/mL (ref 0.00–0.08)

## 2017-07-19 LAB — HEMOGLOBIN A1C
HEMOGLOBIN A1C: 6 % — AB (ref 4.8–5.6)
Mean Plasma Glucose: 125.5 mg/dL

## 2017-07-19 LAB — GLUCOSE, CAPILLARY
GLUCOSE-CAPILLARY: 136 mg/dL — AB (ref 65–99)
GLUCOSE-CAPILLARY: 206 mg/dL — AB (ref 65–99)
Glucose-Capillary: 202 mg/dL — ABNORMAL HIGH (ref 65–99)

## 2017-07-19 LAB — MAGNESIUM: Magnesium: 1.9 mg/dL (ref 1.7–2.4)

## 2017-07-19 LAB — CBG MONITORING, ED: GLUCOSE-CAPILLARY: 140 mg/dL — AB (ref 65–99)

## 2017-07-19 MED ORDER — SODIUM CHLORIDE 0.9 % IV BOLUS
500.0000 mL | Freq: Once | INTRAVENOUS | Status: AC
  Administered 2017-07-19: 500 mL via INTRAVENOUS

## 2017-07-19 MED ORDER — ACETAMINOPHEN 325 MG PO TABS: 650.0000 mg | ORAL_TABLET | Freq: Four times a day (QID) | ORAL | Status: DC | PRN

## 2017-07-19 MED ORDER — ENOXAPARIN SODIUM 40 MG/0.4ML ~~LOC~~ SOLN
40.0000 mg | SUBCUTANEOUS | Status: DC
  Administered 2017-07-19 – 2017-07-20 (×2): 40 mg via SUBCUTANEOUS
  Filled 2017-07-19 (×2): qty 0.4

## 2017-07-19 MED ORDER — ONDANSETRON HCL 4 MG/2ML IJ SOLN: 4.0000 mg | Freq: Four times a day (QID) | INTRAMUSCULAR | Status: DC | PRN

## 2017-07-19 MED ORDER — ONDANSETRON HCL 4 MG PO TABS: 4.0000 mg | ORAL_TABLET | Freq: Four times a day (QID) | ORAL | Status: DC | PRN

## 2017-07-19 MED ORDER — ASPIRIN EC 81 MG PO TBEC
162.0000 mg | DELAYED_RELEASE_TABLET | Freq: Every day | ORAL | Status: DC
  Administered 2017-07-19 – 2017-07-20 (×2): 162 mg via ORAL
  Filled 2017-07-19 (×2): qty 2

## 2017-07-19 MED ORDER — CITALOPRAM HYDROBROMIDE 20 MG PO TABS
10.0000 mg | ORAL_TABLET | Freq: Every day | ORAL | Status: DC
  Administered 2017-07-19 – 2017-07-20 (×2): 10 mg via ORAL
  Filled 2017-07-19 (×2): qty 1

## 2017-07-19 MED ORDER — INSULIN ASPART 100 UNIT/ML ~~LOC~~ SOLN
0.0000 [IU] | Freq: Three times a day (TID) | SUBCUTANEOUS | Status: DC
  Administered 2017-07-19: 3 [IU] via SUBCUTANEOUS
  Administered 2017-07-20: 2 [IU] via SUBCUTANEOUS

## 2017-07-19 MED ORDER — ADULT MULTIVITAMIN W/MINERALS CH
1.0000 | ORAL_TABLET | Freq: Every day | ORAL | Status: DC
  Administered 2017-07-19 – 2017-07-20 (×2): 1 via ORAL
  Filled 2017-07-19 (×2): qty 1

## 2017-07-19 MED ORDER — ACETAMINOPHEN 650 MG RE SUPP: 650.0000 mg | Freq: Four times a day (QID) | RECTAL | Status: DC | PRN

## 2017-07-19 MED ORDER — LATANOPROST 0.005 % OP SOLN
1.0000 [drp] | Freq: Every day | OPHTHALMIC | Status: DC
  Administered 2017-07-19: 1 [drp] via OPHTHALMIC
  Filled 2017-07-19: qty 2.5

## 2017-07-19 MED ORDER — ROSUVASTATIN CALCIUM 10 MG PO TABS
10.0000 mg | ORAL_TABLET | Freq: Every day | ORAL | Status: DC
  Administered 2017-07-19 – 2017-07-20 (×2): 10 mg via ORAL
  Filled 2017-07-19 (×2): qty 1

## 2017-07-19 MED ORDER — VITAMIN C 500 MG PO TABS
500.0000 mg | ORAL_TABLET | Freq: Every day | ORAL | Status: DC
  Administered 2017-07-19 – 2017-07-20 (×2): 500 mg via ORAL
  Filled 2017-07-19 (×2): qty 1

## 2017-07-19 MED ORDER — SODIUM CHLORIDE 0.9 % IV BOLUS
1000.0000 mL | Freq: Once | INTRAVENOUS | Status: AC
  Administered 2017-07-19: 1000 mL via INTRAVENOUS

## 2017-07-19 NOTE — H&P (Signed)
History and Physical    Daniel [name] WRU:[id number] DOB: 12-Dec-1933 DOA: 07/19/2017  PCP: Jani Gravel, MD  Patient coming from: Home.  Chief Complaint: Low blood sugar low blood pressure.  HPI: Daniel [name] is a 84 y.o. male with history of diabetes mellitus type 2 takes NovoLog insulin twice a day usually once in the morning and if needed in the night who was recently admitted 3 weeks ago for seizures and eventually discharged to rehab and had stayed in rehab for couple of days and was discharged back home and since then has not been taking antiseizure medications was brought to the ER after EMS was called for hypoglycemia.  Patient's wife states that last night around 11 PM patient was walking to the bathroom when he suddenly had a fall but did not lose consciousness.  Patient was cold and clammy.  Patient's wife checked his blood sugar was around 40s.  She gave some orange juice following which that sugar improved to 58.  Patient was still feeling weak but he denies any chest pain or shortness of breath.  Had not missed his dinner.  When EMS arrived patient blood pressure was hypotensive.  ED Course: In the ER patient was given a liter and a half bolus following which blood pressure remained stable blood sugar is around 100 now.  Patient denies any chest pain or shortness of breath.  Admit for further observation.  No signs of any sepsis.  EKG shows normal sinus rhythm with low voltage.  CT head is unremarkable chest x-ray was unremarkable.  Review of Systems: As per HPI, rest all negative.   Past Medical History:  Diagnosis Date  . Alcohol abuse   . Cancer [facility])    prostate  . Cardiomyopathy ([place])   . CHF (congestive heart failure) ([place])   . Complication of anesthesia    Hx of seizure when put to sleep for surgery on 08/2009  . Constipation   . Coronary artery disease   . Diabetes mellitus without complication ([place])   . H/O hiatal hernia   . Hyperlipidemia   . Hypertension    . Peripheral neuropathy   . Seizures ([place])   . Urinary incontinence     Past Surgical History:  Procedure Laterality Date  . CATARACT EXTRACTION, BILATERAL    . CORONARY ANGIOPLASTY     stents x 2  . CORONARY STENT PLACEMENT     2004  . PROSTATE SURGERY  2008   SEED IMPLANTS  . RADIOACTIVE SEED IMPLANT     prostate  . TONSILLECTOMY     1940's     reports that he has never smoked. He has never used smokeless tobacco. He reports that he drinks alcohol. He reports that he does not use drugs.  No Known Allergies  Family History  Problem Relation Age of Onset  . Diabetes Mother   . Heart disease Father   . Heart disease Brother   . Diabetes Brother     Prior to Admission medications   Medication Sig Start Date End Date Taking? Authorizing Provider  aspirin 81 MG tablet Take 162 mg by mouth daily.    Yes [provider]  B Complex-C (SUPER B COMPLEX PO) Take 1 tablet by mouth daily.   Yes [provider]  citalopram (CELEXA) 10 MG tablet Take 10 mg by mouth daily.    Yes [provider]  finasteride (PROSCAR) 5 MG tablet Take 5 mg by mouth daily.  01/09/13  Yes [provider]  hydrocortisone 2.5 % cream Apply 1 application topically as needed (face).  05/15/14  Yes [provider]  insulin aspart (NOVOLOG) 100 UNIT/ML injection Inject 0-9 Units into the skin 3 (three) times daily with meals. Patient taking differently: Inject 10-30 Units into the skin 3 (three) times daily with meals. Per sliding scale 06/19/17  Yes Danford, Suann Larry, MD  Insulin Degludec (TRESIBA) 100 UNIT/ML SOLN Inject 25 Units into the skin at bedtime.   Yes [provider]  latanoprost (XALATAN) 0.005 % ophthalmic solution Place 1 drop into both eyes at bedtime.   Yes [provider]  Multiple Vitamin (MULTIVITAMIN) tablet Take 1 tablet by mouth daily.   Yes [provider]  rosuvastatin (CRESTOR) 20 MG tablet Take 10 mg by mouth  daily.    Yes [provider]  vitamin C (ASCORBIC ACID) 500 MG tablet Take 500 mg by mouth daily.   Yes [provider]  levETIRAcetam (KEPPRA) 100 MG/ML solution Take 7.5 mLs (750 mg total) by mouth 2 (two) times daily. Patient not taking: Reported on 07/19/2017 06/19/17   Edwin Dada, MD    Physical Exam: Vitals:   07/19/17 0101 07/19/17 0338 07/19/17 0537  BP: (!) 108/58 125/77 (!) 155/75  Pulse: 74 85 77  Resp: 18 15 20   Temp: 97.7 F (36.5 C)    TempSrc: Oral    SpO2: 99% 98% 96%  Weight: 88.5 kg (195 lb)    Height: 5\' 9"  (1.753 m)        Constitutional: Moderately built and nourished. Vitals:   07/19/17 0101 07/19/17 0338 07/19/17 0537  BP: (!) 108/58 125/77 (!) 155/75  Pulse: 74 85 77  Resp: 18 15 20   Temp: 97.7 F (36.5 C)    TempSrc: Oral    SpO2: 99% 98% 96%  Weight: 88.5 kg (195 lb)    Height: 5\' 9"  (1.753 m)     Eyes: Anicteric no pallor present ENMT: No discharge from the ears eyes nose or mouth. Neck: No mass palpated no neck rigidity but no  JVD appreciated. Respiratory: No rhonchi or crepitations. Cardiovascular: S1-S2 heard no murmurs appreciated. Abdomen: Soft nontender bowel sounds present. Musculoskeletal: No edema.  No joint effusion. Skin: No rash.  Mild ecchymotic areas in the right knee. Neurologic: Alert awake oriented to time place and person.  Moves all extremities  5 x 5.  No facial asymmetry tongue is midline. Psychiatric: Appears normal.  Normal affect.   Labs on Admission: I have personally reviewed following labs and imaging studies  CBC: Recent Labs  Lab 07/19/17 0142 07/19/17 0216  WBC  --  12.0*  NEUTROABS  --  9.3*  HGB 12.6* 12.6*  HCT 37.0* 36.5*  MCV  --  93.6  PLT  --  433*   Basic Metabolic Panel: Recent Labs  Lab 07/19/17 0142  NA 138  K 4.3  CL 105  GLUCOSE 138*  BUN 38*  CREATININE 1.70*   GFR: Estimated Creatinine Clearance: 37.5 mL/min (A) (by C-G formula based on SCr of  1.7 mg/dL (H)). Liver Function Tests: No results for input(s): AST, ALT, ALKPHOS, BILITOT, PROT, ALBUMIN in the last 168 hours. No results for input(s): LIPASE, AMYLASE in the last 168 hours. No results for input(s): AMMONIA in the last 168 hours. Coagulation Profile: No results for input(s): INR, PROTIME in the last 168 hours. Cardiac Enzymes: No results for input(s): CKTOTAL, CKMB, CKMBINDEX, TROPONINI in the last 168 hours. BNP (last  3 results) No results for input(s): PROBNP in the last 8760 hours. HbA1C: No results for input(s): HGBA1C in the last 72 hours. CBG: Recent Labs  Lab 07/19/17 0150  GLUCAP 140*   Lipid Profile: No results for input(s): CHOL, HDL, LDLCALC, TRIG, CHOLHDL, LDLDIRECT in the last 72 hours. Thyroid Function Tests: No results for input(s): TSH, T4TOTAL, FREET4, T3FREE, THYROIDAB in the last 72 hours. Anemia Panel: No results for input(s): VITAMINB12, FOLATE, FERRITIN, TIBC, IRON, RETICCTPCT in the last 72 hours. Urine analysis:    Component Value Date/Time   COLORURINE YELLOW 07/19/2017 0411   APPEARANCEUR CLEAR 07/19/2017 0411   LABSPEC 1.015 07/19/2017 0411   PHURINE 5.0 07/19/2017 0411   GLUCOSEU NEGATIVE 07/19/2017 0411   HGBUR NEGATIVE 07/19/2017 0411   BILIRUBINUR NEGATIVE 07/19/2017 0411   KETONESUR NEGATIVE 07/19/2017 0411   PROTEINUR >=300 (A) 07/19/2017 0411   UROBILINOGEN 1.0 04/17/2013 1014   NITRITE NEGATIVE 07/19/2017 0411   LEUKOCYTESUR NEGATIVE 07/19/2017 0411   Sepsis Labs: @LABRCNTIP (procalcitonin:4,lacticidven:4) )No results found for this or any previous visit (from the past 240 hour(s)).   Radiological Exams on Admission: Ct Head Wo Contrast  Result Date: 07/19/2017 CLINICAL DATA:  [place] at home. No loss of consciousness. Low blood pressure. History of hypertension, diabetes, seizures, prostate cancer. EXAM: CT HEAD WITHOUT CONTRAST TECHNIQUE: Contiguous axial images were obtained from the base of the skull through the  vertex without intravenous contrast. COMPARISON:  MRI brain 06/16/2017.  CT head 06/16/2017. FINDINGS: Brain: Diffuse cerebral atrophy. Ventricular dilatation consistent with central atrophy. Low-attenuation changes in the deep white matter consistent small vessel ischemia. No mass effect or midline shift. No abnormal extra-axial fluid collections. Gray-white matter junctions are distinct. Basal cisterns are not effaced. No acute intracranial hemorrhage. Vascular: Intracranial arterial vascular calcifications are present. Calcification in the scalp vessels as well. Skull: Calvarium appears intact. No acute depressed skull fractures. Sinuses/Orbits: Mucosal thickening in the paranasal sinuses most prominent in the sphenoid sinus. No acute air-fluid levels. Mastoid air cells are clear. Other: None IMPRESSION: No acute intracranial abnormalities. Chronic atrophy and small vessel ischemic changes. Chronic opacification of the left sphenoid sinus. Electronically Signed   By: Lucienne Capers M.D.   On: 07/19/2017 02:33   Dg Chest Portable 1 View  Result Date: 07/19/2017 CLINICAL DATA:  Hypotension. Fall. History of prostate cancer. Nonsmoker. EXAM: PORTABLE CHEST 1 VIEW COMPARISON:  06/16/2017 FINDINGS: Interval removal of NG and endotracheal tubes. Shallow inspiration with linear atelectasis in the lung bases. Cardiac enlargement without vascular congestion. No edema or consolidation. No blunting of costophrenic angles. No pneumothorax. Mediastinal contours appear intact. Calcification of the aorta. IMPRESSION: Shallow inspiration with atelectasis in the lung bases. Cardiac enlargement. Aortic atherosclerosis. Electronically Signed   By: Lucienne Capers M.D.   On: 07/19/2017 01:36    EKG: Independently reviewed.  Normal sinus rhythm low voltage.  Assessment/Plan Principal Problem:   Hypoglycemia Active Problems:   DM2 (diabetes mellitus, type 2) (HCC)   Coronary artery disease   Seizure (HCC)    Hypotension   Normochromic normocytic anemia    1. Hypoglycemia in a patient with diabetes mellitus -cause not clear.  Will keep off insulin for now and check CBGs closely and start diet.  Check hemoglobin A1c.  Patient does not take long-acting insulin though it is mentioned in the medication list.  Only takes NovoLog as needed twice a day. 2. Hypotension likely vasovagal after hypoglycemic episode.  No signs of sepsis.  Denies any chest pain.  Closely observe.  Patient received fluids in the ER.  Not ordering any further fluids.  We will get physical therapy consult.  Holding off finasteride for now until blood pressure stable. 3. History of recent seizure -patient and patient's wife does not want patient to be restarted on antiseizure medication until patient discussed with his primary care physician Dr.James Kim during the next appointment 2 days from now and also with a neurologist with whom they have a appointment in June. 4. History of CAD -on aspirin and statin.  Moves check cardiac markers but he denies any chest pain. 5. Normocytic normochromic anemia appears to be chronic.  Follow CBC. 6. Thrombocytopenia follow platelet counts. 7. Chronic kidney disease stage III creatinine appears to be at baseline.   DVT prophylaxis: Lovenox. Code Status: Full code. Family Communication: Patient's wife. Disposition Plan: Home. Consults called: Physical therapy. Admission status: Observation.   Rise Patience MD [facility] Pager [phone].  If 7PM-7AM, please contact night-coverage [email or web address] Password [facility]  07/19/2017, 6:29 AM

## 2017-07-19 NOTE — ED Notes (Signed)
Bed: QZ30 Expected date:  Expected time:  Means of arrival:  Comments: synope

## 2017-07-19 NOTE — ED Notes (Signed)
No respiratory or acute distress noted alert and oriented x 3 family at bedside call light in reach. 

## 2017-07-19 NOTE — ED Provider Notes (Signed)
[facility] DEPT Provider Note   CSN: [id number] Arrival date & time: 07/19/17  0056     History   Chief Complaint Chief Complaint  Patient presents with  . Hypotension    HPI Daniel [name] is a 84 y.o. male.  The history is provided by the patient and the EMS personnel.  Altered Mental Status   This is a new problem. The current episode started 3 to 5 hours ago. The problem has been resolved. Pertinent negatives include no seizures. Associated symptoms comments: Fall decreased responsive . Risk factors: diabetes with hypoglycemia wife attempted to give multiple interventions. His past medical history is significant for diabetes. His past medical history does not include AIDS.    Past Medical History:  Diagnosis Date  . Alcohol abuse   . Cancer [facility])    prostate  . Cardiomyopathy ([place])   . CHF (congestive heart failure) ([place])   . Complication of anesthesia    Hx of seizure when put to sleep for surgery on 08/2009  . Constipation   . Coronary artery disease   . Diabetes mellitus without complication ([place])   . H/O hiatal hernia   . Hyperlipidemia   . Hypertension   . Peripheral neuropathy   . Seizures ([place])   . Urinary incontinence     Patient Active Problem List   Diagnosis Date Noted  . Seizure ([place]) 06/16/2017  . Type II diabetes mellitus with neurological manifestations ([place]) 03/05/2015  . Porokeratosis 03/05/2015  . Chronic systolic CHF (congestive heart failure) ([place]) 01/28/2015  . Coronary artery disease 01/28/2015  . Bruit of left carotid artery 01/28/2015  . Acute on chronic renal failure ([place]) 04/17/2013  . Dehydration 04/17/2013  . Alcohol dependence ([place]) 04/17/2013  . Transaminasemia 04/17/2013  . DM2 (diabetes mellitus, type 2) ([place]) 04/17/2013  . Near syncope 04/17/2013  . UTI (lower urinary tract infection) 04/17/2013  . Hematuria 04/17/2013    Past Surgical History:  Procedure Laterality Date  . CATARACT  EXTRACTION, BILATERAL    . CORONARY ANGIOPLASTY     stents x 2  . CORONARY STENT PLACEMENT     2004  . PROSTATE SURGERY  2008   SEED IMPLANTS  . RADIOACTIVE SEED IMPLANT     prostate  . TONSILLECTOMY     1940's        Home Medications    Prior to Admission medications   Medication Sig Start Date End Date Taking? Authorizing Provider  aspirin 81 MG tablet Take 81 mg by mouth daily.    [provider]  B Complex-C (SUPER B COMPLEX PO) Take 1 tablet by mouth daily.    [provider]  citalopram (CELEXA) 10 MG tablet Take 10 mg by mouth daily.     [provider]  finasteride (PROSCAR) 5 MG tablet Take 5 mg by mouth daily.  01/09/13   [provider]  hydrocortisone 2.5 % cream Apply 1 application topically as needed (face).  05/15/14   [provider]  insulin aspart (NOVOLOG) 100 UNIT/ML injection Inject 0-9 Units into the skin 3 (three) times daily with meals. 06/19/17   Danford, Suann Larry, MD  latanoprost (XALATAN) 0.005 % ophthalmic solution Place 1 drop into both eyes at bedtime.    [provider]  levETIRAcetam (KEPPRA) 100 MG/ML solution Take 7.5 mLs (750 mg total) by mouth 2 (two) times daily. 06/19/17   Danford, Suann Larry, MD  Multiple Vitamin (MULTIVITAMIN) tablet Take 1 tablet by mouth daily.  [provider]  rosuvastatin (CRESTOR) 20 MG tablet Take 10 mg by mouth daily.     [provider]  vitamin C (ASCORBIC ACID) 500 MG tablet Take 500 mg by mouth daily.    [provider]    Family History Family History  Problem Relation Age of Onset  . Diabetes Mother   . Heart disease Father   . Heart disease Brother   . Diabetes Brother     Social History Social History   Tobacco Use  . Smoking status: Never Smoker  . Smokeless tobacco: Never Used  Substance Use Topics  . Alcohol use: Yes  . Drug use: No     Allergies   Patient has no known allergies.   Review of  Systems Review of Systems  Constitutional: Negative for fever.  HENT: Negative for sore throat.   Respiratory: Negative for shortness of breath.   Cardiovascular: Negative for chest pain, palpitations and leg swelling.  Gastrointestinal: Negative for abdominal pain, blood in stool, nausea and rectal pain.  Genitourinary: Negative for dysuria.  Neurological: Negative for seizures.  All other systems reviewed and are negative.    Physical Exam Updated Vital Signs BP 125/77   Pulse 85   Temp 97.7 F (36.5 C) (Oral)   Resp 15   Ht 5\' 9"  (1.753 m)   Wt 88.5 kg (195 lb)   SpO2 98%   BMI 28.80 kg/m   Physical Exam  Constitutional: He is oriented to person, place, and time. He appears well-developed and well-nourished. No distress.  HENT:  Head: Normocephalic and atraumatic.  Mouth/Throat: No oropharyngeal exudate.  Eyes: Pupils are equal, round, and reactive to light. Conjunctivae are normal.  Neck: Normal range of motion. Neck supple. No JVD present.  Cardiovascular: Normal rate, regular rhythm, normal heart sounds and intact distal pulses.  Pulmonary/Chest: Effort normal and breath sounds normal. No stridor. He has no wheezes. He has no rales.  Abdominal: Soft. Bowel sounds are normal. He exhibits no mass. There is no tenderness. There is no rebound and no guarding.  Musculoskeletal: Normal range of motion.  Neurological: He is alert and oriented to person, place, and time. He displays normal reflexes. No cranial nerve deficit.  Skin: Skin is warm and dry. Capillary refill takes less than 2 seconds.     ED Treatments / Results  Labs (all labs ordered are listed, but only abnormal results are displayed) Results for orders placed or performed during the hospital encounter of 07/19/17  Urinalysis, Routine w reflex microscopic  Result Value Ref Range   Color, Urine YELLOW YELLOW   APPearance CLEAR CLEAR   Specific Gravity, Urine 1.015 1.005 - 1.030   pH 5.0 5.0 - 8.0    Glucose, UA NEGATIVE NEGATIVE mg/dL   Hgb urine dipstick NEGATIVE NEGATIVE   Bilirubin Urine NEGATIVE NEGATIVE   Ketones, ur NEGATIVE NEGATIVE mg/dL   Protein, ur >=300 (A) NEGATIVE mg/dL   Nitrite NEGATIVE NEGATIVE   Leukocytes, UA NEGATIVE NEGATIVE   RBC / HPF 0-5 0 - 5 RBC/hpf   WBC, UA 0-5 0 - 5 WBC/hpf   Bacteria, UA NONE SEEN NONE SEEN   Squamous Epithelial / LPF 0-5 0 - 5   Mucus PRESENT    Hyaline Casts, UA PRESENT   CBC  Result Value Ref Range   WBC 12.0 (H) 4.0 - 10.5 K/uL   RBC 3.90 (L) 4.22 - 5.81 MIL/uL   Hemoglobin 12.6 (L) 13.0 - 17.0 g/dL   HCT 36.5 (  L) 39.0 - 52.0 %   MCV 93.6 78.0 - 100.0 fL   MCH 32.3 26.0 - 34.0 pg   MCHC 34.5 30.0 - 36.0 g/dL   RDW 12.6 11.5 - 15.5 %   Platelets 137 (L) 150 - 400 K/uL  Differential  Result Value Ref Range   Neutrophils Relative % 77 %   Neutro Abs 9.3 (H) 1.7 - 7.7 K/uL   Lymphocytes Relative 14 %   Lymphs Abs 1.6 0.7 - 4.0 K/uL   Monocytes Relative 9 %   Monocytes Absolute 1.0 0.1 - 1.0 K/uL   Eosinophils Relative 0 %   Eosinophils Absolute 0.0 0.0 - 0.7 K/uL   Basophils Relative 0 %   Basophils Absolute 0.0 0.0 - 0.1 K/uL  I-Stat Chem 8, ED  Result Value Ref Range   Sodium 138 135 - 145 mmol/L   Potassium 4.3 3.5 - 5.1 mmol/L   Chloride 105 101 - 111 mmol/L   BUN 38 (H) 6 - 20 mg/dL   Creatinine, Ser 1.70 (H) 0.61 - 1.24 mg/dL   Glucose, Bld 138 (H) 65 - 99 mg/dL   Calcium, Ion 1.14 (L) 1.15 - 1.40 mmol/L   TCO2 21 (L) 22 - 32 mmol/L   Hemoglobin 12.6 (L) 13.0 - 17.0 g/dL   HCT 37.0 (L) 39.0 - 52.0 %  I-stat troponin, ED  Result Value Ref Range   Troponin i, poc 0.04 0.00 - 0.08 ng/mL   Comment 3          CBG monitoring, ED  Result Value Ref Range   Glucose-Capillary 140 (H) 65 - 99 mg/dL   Ct Head Wo Contrast  Result Date: 07/19/2017 CLINICAL DATA:  [place] at home. No loss of consciousness. Low blood pressure. History of hypertension, diabetes, seizures, prostate cancer. EXAM: CT HEAD WITHOUT CONTRAST  TECHNIQUE: Contiguous axial images were obtained from the base of the skull through the vertex without intravenous contrast. COMPARISON:  MRI brain 06/16/2017.  CT head 06/16/2017. FINDINGS: Brain: Diffuse cerebral atrophy. Ventricular dilatation consistent with central atrophy. Low-attenuation changes in the deep white matter consistent small vessel ischemia. No mass effect or midline shift. No abnormal extra-axial fluid collections. Gray-white matter junctions are distinct. Basal cisterns are not effaced. No acute intracranial hemorrhage. Vascular: Intracranial arterial vascular calcifications are present. Calcification in the scalp vessels as well. Skull: Calvarium appears intact. No acute depressed skull fractures. Sinuses/Orbits: Mucosal thickening in the paranasal sinuses most prominent in the sphenoid sinus. No acute air-fluid levels. Mastoid air cells are clear. Other: None IMPRESSION: No acute intracranial abnormalities. Chronic atrophy and small vessel ischemic changes. Chronic opacification of the left sphenoid sinus. Electronically Signed   By: Lucienne Capers M.D.   On: 07/19/2017 02:33   Dg Chest Portable 1 View  Result Date: 07/19/2017 CLINICAL DATA:  Hypotension. Fall. History of prostate cancer. Nonsmoker. EXAM: PORTABLE CHEST 1 VIEW COMPARISON:  06/16/2017 FINDINGS: Interval removal of NG and endotracheal tubes. Shallow inspiration with linear atelectasis in the lung bases. Cardiac enlargement without vascular congestion. No edema or consolidation. No blunting of costophrenic angles. No pneumothorax. Mediastinal contours appear intact. Calcification of the aorta. IMPRESSION: Shallow inspiration with atelectasis in the lung bases. Cardiac enlargement. Aortic atherosclerosis. Electronically Signed   By: Lucienne Capers M.D.   On: 07/19/2017 01:36    EKG EKG Interpretation  Date/Time:  Monday Gerrett Loman 29 2019 01:43:28 EDT Ventricular Rate:  76 PR Interval:    QRS Duration: 115 QT  Interval:  431 QTC Calculation:  485 R Axis:   -76 Text Interpretation:  Sinus rhythm Left anterior fascicular block Low voltage, precordial leads Confirmed by Randal Buba, Diona Peregoy ([id number]) on 07/19/2017 1:56:10 AM   Radiology Ct Head Wo Contrast  Result Date: 07/19/2017 CLINICAL DATA:  [place] at home. No loss of consciousness. Low blood pressure. History of hypertension, diabetes, seizures, prostate cancer. EXAM: CT HEAD WITHOUT CONTRAST TECHNIQUE: Contiguous axial images were obtained from the base of the skull through the vertex without intravenous contrast. COMPARISON:  MRI brain 06/16/2017.  CT head 06/16/2017. FINDINGS: Brain: Diffuse cerebral atrophy. Ventricular dilatation consistent with central atrophy. Low-attenuation changes in the deep white matter consistent small vessel ischemia. No mass effect or midline shift. No abnormal extra-axial fluid collections. Gray-white matter junctions are distinct. Basal cisterns are not effaced. No acute intracranial hemorrhage. Vascular: Intracranial arterial vascular calcifications are present. Calcification in the scalp vessels as well. Skull: Calvarium appears intact. No acute depressed skull fractures. Sinuses/Orbits: Mucosal thickening in the paranasal sinuses most prominent in the sphenoid sinus. No acute air-fluid levels. Mastoid air cells are clear. Other: None IMPRESSION: No acute intracranial abnormalities. Chronic atrophy and small vessel ischemic changes. Chronic opacification of the left sphenoid sinus. Electronically Signed   By: Lucienne Capers M.D.   On: 07/19/2017 02:33   Dg Chest Portable 1 View  Result Date: 07/19/2017 CLINICAL DATA:  Hypotension. Fall. History of prostate cancer. Nonsmoker. EXAM: PORTABLE CHEST 1 VIEW COMPARISON:  06/16/2017 FINDINGS: Interval removal of NG and endotracheal tubes. Shallow inspiration with linear atelectasis in the lung bases. Cardiac enlargement without vascular congestion. No edema or consolidation. No blunting  of costophrenic angles. No pneumothorax. Mediastinal contours appear intact. Calcification of the aorta. IMPRESSION: Shallow inspiration with atelectasis in the lung bases. Cardiac enlargement. Aortic atherosclerosis. Electronically Signed   By: Lucienne Capers M.D.   On: 07/19/2017 01:36    Procedures Procedures (including critical care time)  Medications Ordered in ED Medications  sodium chloride 0.9 % bolus 1,000 mL (has no administration in time range)  sodium chloride 0.9 % bolus 500 mL (0 mLs Intravenous Stopped 07/19/17 0334)      Final Clinical Impressions(s) / ED Diagnoses   Will admit for observation, original call out was for hypoglycemia and patient was found to be low normal but hypotensive.  Increased BUN and creatinine.  Denies CP SOb, n/v/d.     Kourtni Stineman, MD 07/19/17 3612

## 2017-07-19 NOTE — ED Triage Notes (Signed)
[place] at home no LOC no complaints per pt with a blood pressure 74/40 with 500 cc bag NS and now 126/67 blood 89 then rechecked 110.

## 2017-07-19 NOTE — ED Notes (Signed)
ED TO INPATIENT HANDOFF REPORT  Name/Age/Gender Daniel [name] 84 y.o. male  Code Status    Code Status Orders  (From admission, onward)        Start     Ordered   07/19/17 0629  Do not attempt resuscitation (DNR)  Continuous    Question Answer Comment  In the event of cardiac or respiratory ARREST Do not call a "code blue"   In the event of cardiac or respiratory ARREST Do not perform Intubation, CPR, defibrillation or ACLS   In the event of cardiac or respiratory ARREST Use medication by any route, position, wound care, and other measures to relive pain and suffering. May use oxygen, suction and manual treatment of airway obstruction as needed for comfort.      07/19/17 0629    Code Status History    Date Active Date Inactive Code Status Order ID Comments User Context   06/16/2017 2026 06/23/2017 1957 DNR [id number]  Arnell Asal, NP Inpatient   06/16/2017 1601 06/16/2017 2026 Full Code [id number]  Sampson Goon, MD ED   04/17/2013 1727 04/20/2013 1802 Full Code [id number]  Orson Eva, MD Inpatient      Home/SNF/Other Home  Chief Complaint hypertension  Level of Care/Admitting Diagnosis ED Disposition    ED Disposition Condition Comment   [facility] [[id number]]  Level of Care: Telemetry [5]  Admit to tele based on following criteria: Monitor QTC interval  Diagnosis: Hypoglycemia [035009]  Admitting Physician: Rise Patience [phone]  Attending Physician: Rise Patience [phone]  PT Class (Do Not Modify): Observation [104]  PT Acc Code (Do Not Modify): Observation [10022]       Medical History Past Medical History:  Diagnosis Date  . Alcohol abuse   . Cancer [facility])    prostate  . Cardiomyopathy ([place])   . CHF (congestive heart failure) ([place])   . Complication of anesthesia    Hx of seizure when put to sleep for surgery on 08/2009  . Constipation   . Coronary artery disease   . Diabetes mellitus without  complication ([place])   . H/O hiatal hernia   . Hyperlipidemia   . Hypertension   . Peripheral neuropathy   . Seizures ([place])   . Urinary incontinence     Allergies No Known Allergies  IV Location/Drains/Wounds Patient Lines/Drains/Airways Status   Active Line/Drains/Airways    Name:   Placement date:   Placement time:   Site:   Days:   Peripheral IV 07/19/17 Left Hand   07/19/17    0135    Hand   less than 1   Wound Other (Comment) Knee Right;Left scratch on both knees   -    -    Knee      Wound 04/18/13 Other (Comment) Toe (Comment  which one) Right   04/18/13    1000    Toe (Comment  which one)   1553          Labs/Imaging Results for orders placed or performed during the hospital encounter of 07/19/17 (from the past 48 hour(s))  I-Stat Chem 8, ED     Status: Abnormal   Collection Time: 07/19/17  1:42 AM  Result Value Ref Range   Sodium 138 135 - 145 mmol/L   Potassium 4.3 3.5 - 5.1 mmol/L   Chloride 105 101 - 111 mmol/L   BUN 38 (H) 6 - 20 mg/dL   Creatinine, Ser 1.70 (H) 0.61 -  1.24 mg/dL   Glucose, Bld 138 (H) 65 - 99 mg/dL   Calcium, Ion 1.14 (L) 1.15 - 1.40 mmol/L   TCO2 21 (L) 22 - 32 mmol/L   Hemoglobin 12.6 (L) 13.0 - 17.0 g/dL   HCT 37.0 (L) 39.0 - 52.0 %  CBG monitoring, ED     Status: Abnormal   Collection Time: 07/19/17  1:50 AM  Result Value Ref Range   Glucose-Capillary 140 (H) 65 - 99 mg/dL  I-stat troponin, ED     Status: None   Collection Time: 07/19/17  1:52 AM  Result Value Ref Range   Troponin i, poc 0.04 0.00 - 0.08 ng/mL   Comment 3            Comment: Due to the release kinetics of cTnI, a negative result within the first hours of the onset of symptoms does not rule out myocardial infarction with certainty. If myocardial infarction is still suspected, repeat the test at appropriate intervals.   CBC     Status: Abnormal   Collection Time: 07/19/17  2:16 AM  Result Value Ref Range   WBC 12.0 (H) 4.0 - 10.5 K/uL   RBC 3.90 (L) 4.22 - 5.81  MIL/uL   Hemoglobin 12.6 (L) 13.0 - 17.0 g/dL   HCT 36.5 (L) 39.0 - 52.0 %   MCV 93.6 78.0 - 100.0 fL   MCH 32.3 26.0 - 34.0 pg   MCHC 34.5 30.0 - 36.0 g/dL   RDW 12.6 11.5 - 15.5 %   Platelets 137 (L) 150 - 400 K/uL    Comment: Performed at [facility], [place] [address], [place], [place] [address]  Differential     Status: Abnormal   Collection Time: 07/19/17  2:16 AM  Result Value Ref Range   Neutrophils Relative % 77 %   Neutro Abs 9.3 (H) 1.7 - 7.7 K/uL   Lymphocytes Relative 14 %   Lymphs Abs 1.6 0.7 - 4.0 K/uL   Monocytes Relative 9 %   Monocytes Absolute 1.0 0.1 - 1.0 K/uL   Eosinophils Relative 0 %   Eosinophils Absolute 0.0 0.0 - 0.7 K/uL   Basophils Relative 0 %   Basophils Absolute 0.0 0.0 - 0.1 K/uL    Comment: Performed at [facility], [place] [address] [place], [place] [address]  Urinalysis, Routine w reflex microscopic     Status: Abnormal   Collection Time: 07/19/17  4:11 AM  Result Value Ref Range   Color, Urine YELLOW YELLOW   APPearance CLEAR CLEAR   Specific Gravity, Urine 1.015 1.005 - 1.030   pH 5.0 5.0 - 8.0   Glucose, UA NEGATIVE NEGATIVE mg/dL   Hgb urine dipstick NEGATIVE NEGATIVE   Bilirubin Urine NEGATIVE NEGATIVE   Ketones, ur NEGATIVE NEGATIVE mg/dL   Protein, ur >=300 (A) NEGATIVE mg/dL   Nitrite NEGATIVE NEGATIVE   Leukocytes, UA NEGATIVE NEGATIVE   RBC / HPF 0-5 0 - 5 RBC/hpf   WBC, UA 0-5 0 - 5 WBC/hpf   Bacteria, UA NONE SEEN NONE SEEN   Squamous Epithelial / LPF 0-5 0 - 5    Comment: Please note change in reference range.   Mucus PRESENT    Hyaline Casts, UA PRESENT     Comment: Performed at [facility], [place] [address], [place], [place] [id number]   Ct Head Wo Contrast  Result Date: 07/19/2017 CLINICAL DATA:  [place] at home. No loss of consciousness. Low blood pressure. History of  hypertension, diabetes, seizures, prostate cancer. EXAM: CT HEAD WITHOUT CONTRAST TECHNIQUE:  Contiguous axial images were obtained from the base of the skull through the vertex without intravenous contrast. COMPARISON:  MRI brain 06/16/2017.  CT head 06/16/2017. FINDINGS: Brain: Diffuse cerebral atrophy. Ventricular dilatation consistent with central atrophy. Low-attenuation changes in the deep white matter consistent small vessel ischemia. No mass effect or midline shift. No abnormal extra-axial fluid collections. Gray-white matter junctions are distinct. Basal cisterns are not effaced. No acute intracranial hemorrhage. Vascular: Intracranial arterial vascular calcifications are present. Calcification in the scalp vessels as well. Skull: Calvarium appears intact. No acute depressed skull fractures. Sinuses/Orbits: Mucosal thickening in the paranasal sinuses most prominent in the sphenoid sinus. No acute air-fluid levels. Mastoid air cells are clear. Other: None IMPRESSION: No acute intracranial abnormalities. Chronic atrophy and small vessel ischemic changes. Chronic opacification of the left sphenoid sinus. Electronically Signed   By: Lucienne Capers M.D.   On: 07/19/2017 02:33   Dg Chest Portable 1 View  Result Date: 07/19/2017 CLINICAL DATA:  Hypotension. Fall. History of prostate cancer. Nonsmoker. EXAM: PORTABLE CHEST 1 VIEW COMPARISON:  06/16/2017 FINDINGS: Interval removal of NG and endotracheal tubes. Shallow inspiration with linear atelectasis in the lung bases. Cardiac enlargement without vascular congestion. No edema or consolidation. No blunting of costophrenic angles. No pneumothorax. Mediastinal contours appear intact. Calcification of the aorta. IMPRESSION: Shallow inspiration with atelectasis in the lung bases. Cardiac enlargement. Aortic atherosclerosis. Electronically Signed   By: Lucienne Capers M.D.   On: 07/19/2017 01:36    Pending Labs Unresulted Labs (From admission, onward)   Start     Ordered   07/26/17 0500  Creatinine, serum  (enoxaparin (LOVENOX)    CrCl >/= 30  ml/min)  Weekly,   R    Comments:  while on enoxaparin therapy    07/19/17 0629   07/19/17 0629  Troponin I  Once,   R     07/19/17 0629   07/19/17 0628  Comprehensive metabolic panel  Once,   R     07/19/17 0629   07/19/17 0628  CBC WITH DIFFERENTIAL  Once,   R     07/19/17 0629   07/19/17 0628  TSH  Once,   R     07/19/17 0629   07/19/17 0628  Hemoglobin A1c  Once,   R     07/19/17 0629   07/19/17 0627  CBC  (enoxaparin (LOVENOX)    CrCl >/= 30 ml/min)  Once,   R    Comments:  Baseline for enoxaparin therapy IF NOT ALREADY DRAWN.  Notify MD if PLT < 100 K.    07/19/17 0629   07/19/17 0113  CBC with Differential/Platelet  Once,   R     07/19/17 0113      Vitals/Pain Today's Vitals   07/19/17 0101 07/19/17 0338 07/19/17 0537 07/19/17 0600  BP: (!) 108/58 125/77 (!) 155/75 (!) 154/74  Pulse: 74 85 77 80  Resp: 18 15 20 20   Temp: 97.7 F (36.5 C)     TempSrc: Oral     SpO2: 99% 98% 96% 98%  Weight: 195 lb (88.5 kg)     Height: 5\' 9"  (1.753 m)     PainSc:   0-No pain     Isolation Precautions No active isolations  Medications Medications  aspirin tablet 162 mg (has no administration in time range)  citalopram (CELEXA) tablet 10 mg (has no administration in time range)  latanoprost (XALATAN) 0.005 % ophthalmic solution  1 drop (has no administration in time range)  multivitamin with minerals tablet 1 tablet (has no administration in time range)  rosuvastatin (CRESTOR) tablet 10 mg (has no administration in time range)  vitamin C (ASCORBIC ACID) tablet 500 mg (has no administration in time range)  acetaminophen (TYLENOL) tablet 650 mg (has no administration in time range)    Or  acetaminophen (TYLENOL) suppository 650 mg (has no administration in time range)  ondansetron (ZOFRAN) tablet 4 mg (has no administration in time range)    Or  ondansetron (ZOFRAN) injection 4 mg (has no administration in time range)  enoxaparin (LOVENOX) injection 40 mg (has no administration  in time range)  sodium chloride 0.9 % bolus 500 mL (0 mLs Intravenous Stopped 07/19/17 0334)  sodium chloride 0.9 % bolus 1,000 mL (0 mLs Intravenous Stopped 07/19/17 0636)    Mobility walks with device

## 2017-07-19 NOTE — Care Management Obs Status (Signed)
[place] NOTIFICATION   Patient Details  Name: Daniel [name] MRN: [id number] Date of Birth: 1933-12-27   Medicare Observation Status Notification Given:  Yes    Lynnell Catalan, RN 07/19/2017, 3:30 PM

## 2017-07-19 NOTE — Progress Notes (Signed)
CRITICAL VALUE ALERT  Critical Value:  Troponin 0.04  Date & Time Notied:  07/19/17 @ 5956  Provider Notified: Sarajane Jews  Orders Received/Actions taken: Continue to monitor. Pt not symptomatic

## 2017-07-19 NOTE — ED Notes (Signed)
Pt. CBG 140, RN, Aaron Edelman made aware.

## 2017-07-19 NOTE — Progress Notes (Signed)
  PROGRESS NOTE  Daniel [name] DJM:[id number] DOB: Oct 10, 1933 DOA: 07/19/2017 PCP: Jani Gravel, MD  Brief Narrative: 84 year old man PMH diabetes mellitus type 2 on NovoLog insulin twice daily recently admitted 3 weeks ago for seizures, transfer to skilled nursing facility and subsequently discharged home, not taking antiseizure medications, presented to ED with hypoglycemia.  Also patient had a sudden fall at home without loss of consciousness prior to presentation.  At that time was noted to be hypoglycemic with blood sugar in the 40s.  Admitted for hypoglycemia in the context of diabetes mellitus.  Also hypertension, thought to be vasovagal after hypoglycemic episode.  Assessment/Plan Hypoglycemic episode in the context of diabetes mellitus, etiology unclear.  Associated with fall.  Does take insulin twice daily as needed.  2D echocardiogram March 30 was unremarkable.  EKG this admission sinus rhythm, unremarkable --No hypoglycemia this admission.  Follow clinically.  Hypotension, thought to be vasovagal after hypoglycemic episode.  (Not recorded during this hospitalization). --Physical therapy consult--recommended SNF --Monitor.  Dehydration, resolving.  Appears close to baseline in regard to renal function at this point. --Continue IV fluids.  Recent seizure, patient and wife refusing antiseizure medication until follow-up with primary care physician in 48 hours.  Also has neurologist appointment in June.  CT head no acute disease. --Again they wish to defer seizure medications until he sees PCP.  Coronary artery disease. Trivial troponin elevation, seen March and February this year as well. --Asymptomatic.  Continue aspirin and statin  Thrombocytopenia, chronic.  Dating back to at least 2015, seen intermittently. --Follow-up as an outpatient.  Chronic kidney disease stage III --Appears stable.  DVT prophylaxis: enoxaparin Code Status: full Family Communication:  wife Disposition Plan: pending--SNF vs home   Daniel Hodgkins, MD  [facility] Direct contact: [phone] --[address]  --[email or web address]; password [email or web address]  7PM-7AM contact night coverage as above 07/19/2017, 4:46 PM  LOS: 0 days   Consultants:    Procedures:    Antimicrobials:    Interval history/Subjective: Feels well, no complaints.  Objective: Vitals:  Vitals:   07/19/17 0707 07/19/17 0808  BP: (!) 148/67 (!) 151/77  Pulse: 76 85  Resp: 16 16  Temp: 98 F (36.7 C) 98.3 F (36.8 C)  SpO2: 98% 100%    Exam:  Constitutional:  . Appears calm and comfortable Respiratory:  . CTA bilaterally, no w/r/r.  . Respiratory effort normal.  Cardiovascular:  . RRR, no m/r/g . No LE extremity edema   Psychiatric:  . Mental status o Mood, affect appropriate  I have personally reviewed the following:   Labs:  No hypoglycemia noted.  Blood sugars well controlled.  Creatinine 1.7 >> 1.31  BUN trending down, 34   Scheduled Meds: . aspirin EC  162 mg Oral Daily  . citalopram  10 mg Oral Daily  . enoxaparin (LOVENOX) injection  40 mg Subcutaneous Q24H  . latanoprost  1 drop Both Eyes QHS  . multivitamin with minerals  1 tablet Oral Daily  . rosuvastatin  10 mg Oral Daily  . vitamin C  500 mg Oral Daily   Continuous Infusions:  Principal Problem:   Hypoglycemia Active Problems:   DM2 (diabetes mellitus, type 2) (HCC)   Coronary artery disease   Seizure (HCC)   Hypotension   Normochromic normocytic anemia   Thrombocytopenia (HCC)   CKD (chronic kidney disease), stage III ([place])   LOS: 0 days

## 2017-07-19 NOTE — Evaluation (Signed)
Physical Therapy Evaluation Patient Details Name: Daniel [name] MRN: [id number] DOB: 24-May-1933 Today's Date: 07/19/2017   History of Present Illness  Daniel [name] is a 84 y.o. male with history of diabetes mellitus type 2 , admitted 3 weeks ago for seizures, discharged to rehab then back home. Patient had a fall [place] to [place].  Patient's wife checked his blood sugar was around 40s. EMS called and brought to ED.  Clinical Impression  The patient is pleasant. Wife not present. The patient demonstrates decreased balance and ataxia when standing and transferring to recliner using RW. Pt admitted with above diagnosis. Pt currently with functional limitations due to the deficits listed below (see PT Problem List).  Pt will benefit from skilled PT to increase their independence and safety with mobility to allow discharge to the venue listed below.     Follow Up Recommendations SNF(spouse not present to discuss.) patient recently in SNF , reports HHPT not on board at present.   Equipment Recommendations  None recommended by PT    Recommendations for Other Services       Precautions / Restrictions Precautions Precautions: Fall Precaution Comments: seizures      Mobility  Bed Mobility Overal bed mobility: Needs Assistance Bed Mobility: Supine to Sit     Supine to sit: Min assist;HOB elevated     General bed mobility comments: extra time . assist with trunk, use of bed rail  Transfers Overall transfer level: Needs assistance Equipment used: Rolling walker (2 wheeled) Transfers: Sit to/from [facility] Sit to Stand: Mod assist Stand pivot transfers: Mod assist       General transfer comment: steady assist to rise, wide base, decreased  control of the left leg, Multimodal cues to back up to recliner and reach back to recliner, decreased control of left UE to reach back. wide base, small steps to reclner and back up .  Ambulation/Gait                 Stairs            Wheelchair Mobility    Modified Rankin (Stroke Patients Only)       Balance Overall balance assessment: History of Falls;Needs assistance Sitting-balance support: Feet supported;No upper extremity supported Sitting balance-Leahy Scale: Fair     Standing balance support: During functional activity;Bilateral upper extremity supported Standing balance-Leahy Scale: Poor                               Pertinent Vitals/Pain Pain Assessment: No/denies pain    Home Living Family/patient expects to be discharged to:: Private residence Living Arrangements: Spouse/significant other Available Help at Discharge: Family;Available 24 hours/day Type of Home: House Home Access: Stairs to enter   [employer] of Steps: 1 Home Layout: Two level Home Equipment: Clinical cytogeneticist - 2 wheels;Wheelchair - manual      Prior Function Level of Independence: Needs assistance   Gait / Transfers Assistance Needed: USed RW inside home and wheelchair ina and outside  ADL's / Homemaking Assistance Needed: wife assists pt in shower        Hand Dominance        Extremity/Trunk Assessment   Upper Extremity Assessment Upper Extremity Assessment: LUE deficits/detail LUE Deficits / Details: mildly ataxic, tends to keep hand in a grip but  able to ope the hand    Lower Extremity Assessment Lower Extremity Assessment: LLE deficits/detail;RLE deficits/detail RLE  Deficits / Details: ataxic but less than left LLE Deficits / Details: ataxic, left foot shoots out when standing up.     Cervical / Trunk Assessment Cervical / Trunk Assessment: Other exceptions Cervical / Trunk Exceptions: ataxic and decreased control  Communication   Communication: No difficulties  Cognition Arousal/Alertness: Awake/alert Behavior During Therapy: WFL for tasks assessed/performed Overall Cognitive Status: Within Functional Limits for tasks assessed                                         General Comments      Exercises     Assessment/Plan    PT Assessment Patient needs continued PT services  PT Problem List Decreased strength;Decreased range of motion;Decreased activity tolerance;Decreased balance;Decreased mobility;Decreased knowledge of precautions;Decreased safety awareness;Decreased knowledge of use of DME       PT Treatment Interventions DME instruction;Balance training;Gait training;Functional mobility training;Therapeutic activities;Therapeutic exercise;Patient/family education    PT Goals (Current goals can be found in the Care Plan section)  Acute Rehab PT Goals Patient Stated Goal: to go home PT Goal Formulation: With patient Time For Goal Achievement: 08/02/17 Potential to Achieve Goals: Good    Frequency Min 2X/week   Barriers to discharge        Co-evaluation               AM-PAC PT "6 Clicks" Daily Activity  Outcome Measure Difficulty turning over in bed (including adjusting bedclothes, sheets and blankets)?: A Lot Difficulty moving from lying on back to sitting on the side of the bed? : Unable Difficulty sitting down on and standing up from a chair with arms (e.g., wheelchair, bedside commode, etc,.)?: Unable Help needed moving to and from a bed to chair (including a wheelchair)?: Total Help needed walking in hospital room?: Total Help needed climbing 3-5 steps with a railing? : Total 6 Click Score: 7    End of Session Equipment Utilized During Treatment: Gait belt Activity Tolerance: Patient tolerated treatment well Patient left: in chair;with call bell/phone within reach;with chair alarm set Nurse Communication: Mobility status PT Visit Diagnosis: Unsteadiness on feet (R26.81)    Time: 3428-7681 PT Time Calculation (min) (ACUTE ONLY): 29 min   Charges:   PT Evaluation $PT Eval Low Complexity: 1 Low PT Treatments $Therapeutic Activity: 8-22 mins   PT G Codes:        Claretha Cooper 07/19/2017, 10:19 AM  Tresa Endo PT [phone]

## 2017-07-20 DIAGNOSIS — I2510 Atherosclerotic heart disease of native coronary artery without angina pectoris: Secondary | ICD-10-CM

## 2017-07-20 DIAGNOSIS — D696 Thrombocytopenia, unspecified: Secondary | ICD-10-CM | POA: Diagnosis not present

## 2017-07-20 DIAGNOSIS — I959 Hypotension, unspecified: Secondary | ICD-10-CM | POA: Diagnosis not present

## 2017-07-20 DIAGNOSIS — E162 Hypoglycemia, unspecified: Secondary | ICD-10-CM

## 2017-07-20 DIAGNOSIS — D649 Anemia, unspecified: Secondary | ICD-10-CM | POA: Diagnosis not present

## 2017-07-20 DIAGNOSIS — N183 Chronic kidney disease, stage 3 (moderate): Secondary | ICD-10-CM

## 2017-07-20 DIAGNOSIS — R569 Unspecified convulsions: Secondary | ICD-10-CM | POA: Diagnosis not present

## 2017-07-20 LAB — BASIC METABOLIC PANEL
Anion gap: 8 (ref 5–15)
BUN: 30 mg/dL — AB (ref 6–20)
CALCIUM: 8.6 mg/dL — AB (ref 8.9–10.3)
CO2: 20 mmol/L — AB (ref 22–32)
CREATININE: 1.08 mg/dL (ref 0.61–1.24)
Chloride: 106 mmol/L (ref 101–111)
GFR calc non Af Amer: >60 mL/min (ref >60)
Glucose, Bld: 185 mg/dL — ABNORMAL HIGH (ref 65–99)
Potassium: 4 mmol/L (ref 3.5–5.1)
Sodium: 134 mmol/L — ABNORMAL LOW (ref 135–145)

## 2017-07-20 LAB — GLUCOSE, CAPILLARY
GLUCOSE-CAPILLARY: 198 mg/dL — AB (ref 65–99)
Glucose-Capillary: 96 mg/dL (ref 65–99)

## 2017-07-20 LAB — MAGNESIUM: Magnesium: 1.7 mg/dL (ref 1.7–2.4)

## 2017-07-20 MED ORDER — MAGNESIUM SULFATE 2 GM/50ML IV SOLN
2.0000 g | Freq: Once | INTRAVENOUS | Status: AC
  Administered 2017-07-20: 2 g via INTRAVENOUS
  Filled 2017-07-20: qty 50

## 2017-07-20 NOTE — Discharge Summary (Signed)
Physician Discharge Summary  Daniel [name] [id number]:[id number] DOB: [birth year] DOA: 07/19/2017  PCP: Jani Gravel, MD  Admit date: 07/19/2017 Discharge date: 07/20/2017  Time spent: 45 minutes  Recommendations for Outpatient Follow-up:  Patient will be discharged to home.  Patient will need to follow up with primary care provider within one week of discharge, repeat CBC, BMP and magnesium in one week.  Patient should continue medications as prescribed.  Patient should follow a heart healthy/carb modified diet. Do not drive.  Discharge Diagnoses:  Hypoglycemic episode  Hypotension Fall Seizure Coronary artery disease with mildly elevated troponin Normocytic anemia Thrombocytopenia, chronic Chronic kidney disease, stage III NSVT  Discharge Condition: Stable  Diet recommendation: heart healthy/carb modified   Code status: DNR  Filed Weights   07/19/17 0101 07/20/17 0438  Weight: 88.5 kg (195 lb) 80.2 kg (176 lb 14.4 oz)    History of present illness:  On 07/19/2017 by Dr. Gean Birchwood Daniel [name] is a 84 y.o. male with history of diabetes mellitus type 2 takes NovoLog insulin twice a day usually once in the morning and if needed in the night who was recently admitted 3 weeks ago for seizures and eventually discharged to rehab and had stayed in rehab for couple of days and was discharged back home and since then has not been taking antiseizure medications was brought to the ER after EMS was called for hypoglycemia.  Patient's wife states that last night around 11 PM patient was walking to the bathroom when he suddenly had a fall but did not lose consciousness.  Patient was cold and clammy.  Patient's wife checked his blood sugar was around 40s.  She gave some orange juice following which that sugar improved to 58.  Patient was still feeling weak but he denies any chest pain or shortness of breath.  Had not missed his dinner.  When EMS arrived patient blood pressure was  hypotensive.  Hospital Course:  Hypoglycemic episode  -Supposedly patient had hypoglycemic episode prior to admission -CBGs have been monitored, no further hypoglycemic episodes since admission -Etiology unclear -Patient does take insulin at home twice daily as needed which is administered by his wife -Recent echocardiogram 06/19/2017 showed EF of 60 to 65%.  Wall motion was normal, no regional wall motion abnormalities -EKG unremarkable -hemoglobin A1c 6  Hypotension -Likely vasovagal, occurred after hypoglycemic episode prior to admission -Blood pressure has been stable since admission  Fall -Likely secondary to the above -PT recommended SNF -Patient and wife refused SNF or home health therapy  Seizure -Wife does not want patient to be started on antiseizure medications until after seen by his primary care physician Dr. Maudie Mercury as well as neurology in June 2019  Coronary artery disease with mildly elevated troponin -Currently chest pain-free -Elevated troponin noted in March as well as February of this year as well -Continue aspirin statin  Normocytic anemia -Appears to be chronic, stable  Thrombocytopenia, chronic -Upon review of patient's chart, patient has had thrombocytopenia since 2015 intermittently -Follow-up as an outpatient  Chronic kidney disease, stage III -Stable  NSVT -Potassium 4, magnesium 1.7 and supplemented -Asymptomatic, will follow up with PCP or cardiology as an outpatient -Last echocardiogram noted above  Procedures: none  Consultations: none  Discharge Exam: Vitals:   07/19/17 2248 07/20/17 0438  BP: (!) 158/78 (!) 143/71  Pulse: 80 84  Resp: 12   Temp: 98.4 F (36.9 C) 98.3 F (36.8 C)  SpO2: 100% 97%   Patient feeling better today.  Denies  any current chest pain, shortness breath, abdominal pain, nausea vomiting, diarrhea constipation, dizziness or headache.  Would like to go home.   General: Well developed, well nourished, NAD,  appears stated age  28: NCAT, mucous membranes moist.  Neck: Supple, no JVD, no masses  Cardiovascular: S1 S2 auscultated, RRR, no rubs  Respiratory: Clear to auscultation bilaterally with equal chest rise  Abdomen: Soft, nontender, nondistended, + bowel sounds  Extremities: warm dry without cyanosis clubbing or edema  Neuro: AAOx3, nonfocal  Psych: Normal affect and demeanor, pleasant  Discharge Instructions Discharge Instructions    Discharge instructions   Complete by:  As directed    Patient will need to follow up with primary care provider within one week of discharge, repeat CBC, BMP and magnesium in one week.  Patient should continue medications as prescribed.  Patient should follow a heart healthy/carb modified diet. Do not drive.     Allergies as of 07/20/2017   No Known Allergies     Medication List    TAKE these medications   aspirin 81 MG tablet Take 162 mg by mouth daily.   citalopram 10 MG tablet Commonly known as:  CELEXA Take 10 mg by mouth daily.   finasteride 5 MG tablet Commonly known as:  PROSCAR Take 5 mg by mouth daily.   hydrocortisone 2.5 % cream Apply 1 application topically as needed (face).   insulin aspart 100 UNIT/ML injection Commonly known as:  novoLOG Inject 0-9 Units into the skin 3 (three) times daily with meals. What changed:    how much to take  additional instructions   latanoprost 0.005 % ophthalmic solution Commonly known as:  XALATAN Place 1 drop into both eyes at bedtime.   levETIRAcetam 100 MG/ML solution Commonly known as:  KEPPRA Take 7.5 mLs (750 mg total) by mouth 2 (two) times daily.   multivitamin tablet Take 1 tablet by mouth daily.   rosuvastatin 20 MG tablet Commonly known as:  CRESTOR Take 10 mg by mouth daily.   SUPER B COMPLEX PO Take 1 tablet by mouth daily.   TRESIBA 100 UNIT/ML Soln Generic drug:  Insulin Degludec Inject 25 Units into the skin at bedtime.   vitamin C 500 MG  tablet Commonly known as:  ASCORBIC ACID Take 500 mg by mouth daily.      No Known Allergies Follow-up Information    Jani Gravel, MD. Schedule an appointment as soon as possible for a visit in 1 week(s).   Specialty:  Internal Medicine Why:  Hospital follow up Contact information: [place] [address] [phone]        Sanda Klein, MD .   Specialty:  Cardiology Contact information: [address] [place] [address] [phone]            The results of significant diagnostics from this hospitalization (including imaging, microbiology, ancillary and laboratory) are listed below for reference.    Significant Diagnostic Studies: Ct Head Wo Contrast  Result Date: 07/19/2017 CLINICAL DATA:  [place] at home. No loss of consciousness. Low blood pressure. History of hypertension, diabetes, seizures, prostate cancer. EXAM: CT HEAD WITHOUT CONTRAST TECHNIQUE: Contiguous axial images were obtained from the base of the skull through the vertex without intravenous contrast. COMPARISON:  MRI brain 06/16/2017.  CT head 06/16/2017. FINDINGS: Brain: Diffuse cerebral atrophy. Ventricular dilatation consistent with central atrophy. Low-attenuation changes in the deep white matter consistent small vessel ischemia. No mass effect or midline shift. No abnormal extra-axial  fluid collections. Gray-white matter junctions are distinct. Basal cisterns are not effaced. No acute intracranial hemorrhage. Vascular: Intracranial arterial vascular calcifications are present. Calcification in the scalp vessels as well. Skull: Calvarium appears intact. No acute depressed skull fractures. Sinuses/Orbits: Mucosal thickening in the paranasal sinuses most prominent in the sphenoid sinus. No acute air-fluid levels. Mastoid air cells are clear. Other: None IMPRESSION: No acute intracranial abnormalities. Chronic atrophy and small vessel ischemic changes. Chronic  opacification of the left sphenoid sinus. Electronically Signed   By: Lucienne Capers M.D.   On: 07/19/2017 02:33   Dg Chest Portable 1 View  Result Date: 07/19/2017 CLINICAL DATA:  Hypotension. Fall. History of prostate cancer. Nonsmoker. EXAM: PORTABLE CHEST 1 VIEW COMPARISON:  06/16/2017 FINDINGS: Interval removal of NG and endotracheal tubes. Shallow inspiration with linear atelectasis in the lung bases. Cardiac enlargement without vascular congestion. No edema or consolidation. No blunting of costophrenic angles. No pneumothorax. Mediastinal contours appear intact. Calcification of the aorta. IMPRESSION: Shallow inspiration with atelectasis in the lung bases. Cardiac enlargement. Aortic atherosclerosis. Electronically Signed   By: Lucienne Capers M.D.   On: 07/19/2017 01:36    Microbiology: No results found for this or any previous visit (from the past 240 hour(s)).   Labs: Basic Metabolic Panel: Recent Labs  Lab 07/19/17 0142 07/19/17 0824 07/19/17 1708 07/20/17 1040  NA 138 136  --  134*  K 4.3 3.9  --  4.0  CL 105 107  --  106  CO2  --  21*  --  20*  GLUCOSE 138* 120*  --  185*  BUN 38* 34*  --  30*  CREATININE 1.70* 1.31*  --  1.08  CALCIUM  --  8.6*  --  8.6*  MG  --   --  1.9 1.7   Liver Function Tests: Recent Labs  Lab 07/19/17 0824  AST 26  ALT 15*  ALKPHOS 51  BILITOT 0.7  PROT 6.8  ALBUMIN 3.3*   No results for input(s): LIPASE, AMYLASE in the last 168 hours. No results for input(s): AMMONIA in the last 168 hours. CBC: Recent Labs  Lab 07/19/17 0142 07/19/17 0216  WBC  --  12.0*  NEUTROABS  --  9.3*  HGB 12.6* 12.6*  HCT 37.0* 36.5*  MCV  --  93.6  PLT  --  137*   Cardiac Enzymes: Recent Labs  Lab 07/19/17 0824  TROPONINI 0.04*   BNP: BNP (last 3 results) No results for input(s): BNP in the last 8760 hours.  ProBNP (last 3 results) No results for input(s): PROBNP in the last 8760 hours.  CBG: Recent Labs  Lab 07/19/17 1128  07/19/17 1602 07/19/17 2029 07/20/17 0759 07/20/17 1207  GLUCAP 136* 206* 202* 96 198*       Signed:  Payden Bonus  [facility] 07/20/2017, 1:27 PM

## 2017-07-20 NOTE — Discharge Instructions (Signed)
Hypoglycemia Hypoglycemia is when the sugar (glucose) level in the blood is too low. Symptoms of low blood sugar may include:  Feeling: ? Hungry. ? Worried or nervous (anxious). ? Sweaty and clammy. ? Confused. ? Dizzy. ? Sleepy. ? Sick to your stomach (nauseous).  Having: ? A fast heartbeat. ? A headache. ? A change in your vision. ? Jerky movements that you cannot control (seizure). ? Nightmares. ? Tingling or no feeling (numbness) around the mouth, lips, or tongue.  Having trouble with: ? Talking. ? Paying attention (concentrating). ? Moving (coordination). ? Sleeping.  Shaking.  Passing out (fainting).  Getting upset easily (irritability).  Low blood sugar can happen to people who have diabetes and people who do not have diabetes. Low blood sugar can happen quickly, and it can be an emergency. Treating Low Blood Sugar Low blood sugar is often treated by eating or drinking something sugary right away. If you can think clearly and swallow safely, follow the 15:15 rule:  Take 15 grams of a fast-acting carb (carbohydrate). Some fast-acting carbs are: ? 1 tube of glucose gel. ? 3 sugar tablets (glucose pills). ? 6-8 pieces of hard candy. ? 4 oz (120 mL) of fruit juice. ? 4 oz (120 mL) of regular (not diet) soda.  Check your blood sugar 15 minutes after you take the carb.  If your blood sugar is still at or below 70 mg/dL (3.9 mmol/L), take 15 grams of a carb again.  If your blood sugar does not go above 70 mg/dL (3.9 mmol/L) after 3 tries, get help right away.  After your blood sugar goes back to normal, eat a meal or a snack within 1 hour.  Treating Very Low Blood Sugar If your blood sugar is at or below 54 mg/dL (3 mmol/L), you have very low blood sugar (severe hypoglycemia). This is an emergency. Do not wait to see if the symptoms will go away. Get medical help right away. Call your local emergency services (911 in the [place] Do not drive yourself to the  hospital. If you have very low blood sugar and you cannot eat or drink, you may need a glucagon shot (injection). A family member or friend should learn how to check your blood sugar and how to give you a glucagon shot. Ask your doctor if you need to have a glucagon shot kit at home. Follow these instructions at home: General instructions  Avoid any diets that cause you to not eat enough food. Talk with your doctor before you start any new diet.  Take over-the-counter and prescription medicines only as told by your doctor.  Limit alcohol to no more than 1 drink per day for nonpregnant women and 2 drinks per day for men. One drink equals 12 oz of beer, 5 oz of wine, or 1 oz of hard liquor.  Keep all follow-up visits as told by your doctor. This is important. If You Have Diabetes:   Make sure you know the symptoms of low blood sugar.  Always keep a source of sugar with you, such as: ? Sugar. ? Sugar tablets. ? Glucose gel. ? Fruit juice. ? Regular soda (not diet soda). ? Milk. ? Hard candy. ? Honey.  Take your medicines as told.  Follow your exercise and meal plan. ? Eat on time. Do not skip meals. ? Follow your sick day plan when you cannot eat or drink normally. Make this plan ahead of time with your doctor.  Check your blood sugar as often  as told by your doctor. Always check before and after exercise.  Share your diabetes care plan with: ? Your work or school. ? People you live with.  Check your pee (urine) for ketones: ? When you are sick. ? As told by your doctor.  Carry a card or wear jewelry that says you have diabetes. If You Have Low Blood Sugar From Other Causes:   Check your blood sugar as often as told by your doctor.  Follow instructions from your doctor about what you cannot eat or drink. Contact a doctor if:  You have trouble keeping your blood sugar in your target range.  You have low blood sugar often. Get help right away if:  You still have  symptoms after you eat or drink something sugary.  Your blood sugar is at or below 54 mg/dL (3 mmol/L).  You have jerky movements that you cannot control.  You pass out. These symptoms may be an emergency. Do not wait to see if the symptoms will go away. Get medical help right away. Call your local emergency services (911 in the [place] Do not drive yourself to the hospital. This information is not intended to replace advice given to you by your health care provider. Make sure you discuss any questions you have with your health care provider. Document Released: 06/03/2009 Document Revised: 08/15/2015 Document Reviewed: 04/12/2015 Elsevier Interactive Patient Education  2018 [employer].  Seizure, Adult When you have a seizure:  Parts of your body may move.  How aware or awake (conscious) you are may change.  You may shake (convulse).  Some people have symptoms right before a seizure happens. These symptoms may include:  Fear.  Worry (anxiety).  Feeling like you are going to throw up (nausea).  Feeling like the room is spinning (vertigo).  Feeling like you saw or heard something before (deja vu).  Odd tastes or smells.  Changes in vision, such as seeing flashing lights or spots.  Seizures usually last from 30 seconds to 2 minutes. Usually, they are not harmful unless they last a long time. Follow these instructions at home: Medicines  Take over-the-counter and prescription medicines only as told by your doctor.  Avoid anything that may keep your medicine from working, such as alcohol. Activity  Do not do any activities that would be dangerous if you had another seizure, like driving or swimming. Wait until your doctor approves.  If you live in the [place] ask your local DMV (department of motor vehicles) when you can drive.  Rest. Teaching others  Teach friends and family what to do when you have a seizure. They should: ? Lay you on the ground. ? Protect your head  and body. ? Loosen any tight clothing around your neck. ? Turn you on your side. ? Stay with you until you are better. ? Not hold you down. ? Not put anything in your mouth. ? Know whether or not you need emergency care. General instructions  Contact your doctor each time you have a seizure.  Avoid anything that gives you seizures.  Keep a seizure diary. Write down: ? What you think caused each seizure. ? What you remember about each seizure.  Keep all follow-up visits as told by your doctor. This is important. Contact a doctor if:  You have another seizure.  You have seizures more often.  There is any change in what happens during your seizures.  You continue to have seizures with treatment.  You have symptoms of  being sick or having an infection. Get help right away if:  You have a seizure: ? That lasts longer than 5 minutes. ? That is different than seizures you had before. ? That makes it harder to breathe. ? After you hurt your head.  After a seizure, you cannot speak or use a part of your body.  After a seizure, you are confused or have a bad headache.  You have two or more seizures in a row.  You are having seizures more often.  You do not wake up right after a seizure.  You get hurt during a seizure. In an emergency:  These symptoms may be an emergency. Do not wait to see if the symptoms will go away. Get medical help right away. Call your local emergency services (911 in the [place] Do not drive yourself to the hospital. This information is not intended to replace advice given to you by your health care provider. Make sure you discuss any questions you have with your health care provider. Document Released: 08/26/2007 Document Revised: 11/20/2015 Document Reviewed: 11/20/2015 Elsevier Interactive Patient Education  2017 [employer].

## 2017-07-29 DIAGNOSIS — E1165 Type 2 diabetes mellitus with hyperglycemia: Secondary | ICD-10-CM | POA: Diagnosis not present

## 2017-07-29 DIAGNOSIS — I10 Essential (primary) hypertension: Secondary | ICD-10-CM | POA: Diagnosis not present

## 2017-07-29 DIAGNOSIS — Z09 Encounter for follow-up examination after completed treatment for conditions other than malignant neoplasm: Secondary | ICD-10-CM | POA: Diagnosis not present

## 2017-08-02 DIAGNOSIS — L570 Actinic keratosis: Secondary | ICD-10-CM | POA: Diagnosis not present

## 2017-08-02 DIAGNOSIS — Z85828 Personal history of other malignant neoplasm of skin: Secondary | ICD-10-CM | POA: Diagnosis not present

## 2017-08-02 DIAGNOSIS — L565 Disseminated superficial actinic porokeratosis (DSAP): Secondary | ICD-10-CM | POA: Diagnosis not present

## 2017-08-02 DIAGNOSIS — L918 Other hypertrophic disorders of the skin: Secondary | ICD-10-CM | POA: Diagnosis not present

## 2017-08-05 DIAGNOSIS — I10 Essential (primary) hypertension: Secondary | ICD-10-CM | POA: Diagnosis not present

## 2017-08-05 DIAGNOSIS — E1165 Type 2 diabetes mellitus with hyperglycemia: Secondary | ICD-10-CM | POA: Diagnosis not present

## 2017-08-12 DIAGNOSIS — I10 Essential (primary) hypertension: Secondary | ICD-10-CM | POA: Diagnosis not present

## 2017-08-12 DIAGNOSIS — E119 Type 2 diabetes mellitus without complications: Secondary | ICD-10-CM | POA: Diagnosis not present

## 2017-08-17 DIAGNOSIS — E1165 Type 2 diabetes mellitus with hyperglycemia: Secondary | ICD-10-CM | POA: Diagnosis not present

## 2017-08-17 DIAGNOSIS — I10 Essential (primary) hypertension: Secondary | ICD-10-CM | POA: Diagnosis not present

## 2017-08-17 DIAGNOSIS — E118 Type 2 diabetes mellitus with unspecified complications: Secondary | ICD-10-CM | POA: Diagnosis not present

## 2017-08-17 DIAGNOSIS — E785 Hyperlipidemia, unspecified: Secondary | ICD-10-CM | POA: Diagnosis not present

## 2017-08-23 DIAGNOSIS — E785 Hyperlipidemia, unspecified: Secondary | ICD-10-CM | POA: Diagnosis not present

## 2017-08-23 DIAGNOSIS — I10 Essential (primary) hypertension: Secondary | ICD-10-CM | POA: Diagnosis not present

## 2017-08-23 DIAGNOSIS — E1165 Type 2 diabetes mellitus with hyperglycemia: Secondary | ICD-10-CM | POA: Diagnosis not present

## 2017-08-23 DIAGNOSIS — Z125 Encounter for screening for malignant neoplasm of prostate: Secondary | ICD-10-CM | POA: Diagnosis not present

## 2017-09-02 ENCOUNTER — Ambulatory Visit: Payer: Medicare HMO | Admitting: Neurology

## 2017-09-07 ENCOUNTER — Encounter: Payer: Self-pay | Admitting: Podiatry

## 2017-09-07 ENCOUNTER — Ambulatory Visit: Payer: Medicare HMO | Admitting: Podiatry

## 2017-09-07 DIAGNOSIS — E1149 Type 2 diabetes mellitus with other diabetic neurological complication: Secondary | ICD-10-CM | POA: Diagnosis not present

## 2017-09-07 DIAGNOSIS — Q828 Other specified congenital malformations of skin: Secondary | ICD-10-CM | POA: Diagnosis not present

## 2017-09-07 DIAGNOSIS — B351 Tinea unguium: Secondary | ICD-10-CM

## 2017-09-07 DIAGNOSIS — M79675 Pain in left toe(s): Secondary | ICD-10-CM | POA: Diagnosis not present

## 2017-09-07 DIAGNOSIS — M79674 Pain in right toe(s): Secondary | ICD-10-CM | POA: Diagnosis not present

## 2017-09-08 NOTE — Progress Notes (Signed)
Patient ID: DEJUAN ELMAN, male   DOB: 1933/04/17, 84 y.o.   MRN: [id number]  Subjective: Mr. Kowalski is a 84 year old male who presents today with his wife for recurrent painful, elongated, thickened toenails which he cannot trim himself. Denies any redness or drainage around the nails. He also has calluses to his feet and  denies any redness or drainage around the areas.  He has no other concerns today.  Denies any acute changes since last appointment and no new complaints today. Denies any systemic complaints such as fevers, chills, nausea, vomiting.   Objective: AAO 3, NAD DP/PT pulses palpable, CRT less than 3 seconds Protective sensation decreased with Simms Weinstein monofilament Nails hypertrophic, dystrophic, elongated, brittle, discolored 10. There is tenderness overlying the nails 1-5 bilaterally. There is no surrounding erythema or drainage along the nail sites. Hyperkerotic lesions bilateral sub-hallux although more minimal today. Upon debridement of the lesions, no underlying ulceration, drainage, or other signs of infection.  No pain with calf compression, swelling, warmth, erythema.  Assessment: Patient presents with symptomatic onychomycosis; hyperkerotic lesions  Plan: -Treatment options including alternatives, risks, complications were discussed -Nails sharply debrided 10 without complication/bleeding. -Hyperkerotic lesions sharply debrided x 2 without complications/bleeding.   -Follow-up in 3 months for routine care or sooner if any problems are to arise. In the meantime, encouraged to call the office with any questions, concerns, changes symptoms.  Celesta Gentile, DPM

## 2017-10-13 DIAGNOSIS — R69 Illness, unspecified: Secondary | ICD-10-CM | POA: Diagnosis not present

## 2017-12-07 ENCOUNTER — Ambulatory Visit: Payer: Medicare HMO | Admitting: Podiatry

## 2017-12-07 DIAGNOSIS — M79674 Pain in right toe(s): Secondary | ICD-10-CM | POA: Diagnosis not present

## 2017-12-07 DIAGNOSIS — M79675 Pain in left toe(s): Secondary | ICD-10-CM | POA: Diagnosis not present

## 2017-12-07 DIAGNOSIS — Q828 Other specified congenital malformations of skin: Secondary | ICD-10-CM

## 2017-12-07 DIAGNOSIS — B351 Tinea unguium: Secondary | ICD-10-CM | POA: Diagnosis not present

## 2017-12-07 DIAGNOSIS — E1149 Type 2 diabetes mellitus with other diabetic neurological complication: Secondary | ICD-10-CM | POA: Diagnosis not present

## 2017-12-08 NOTE — Progress Notes (Signed)
Patient ID: GIBSON LAD, male   DOB: 04-Aug-1933, 84 y.o.   MRN: [id number]  Subjective: Mr. Kelm is a 84 year old male who presents today with his wife for recurrent painful, elongated, thickened toenails which he cannot trim himself. Denies any redness or drainage around the nails. He also has calluses to his feet and  denies any redness or drainage around the areas.Denies any systemic complaints such as fevers, chills, nausea, vomiting.   Objective: AAO 3, NAD DP/PT pulses palpable, CRT less than 3 seconds Protective sensation decreased with Simms Weinstein monofilament Nails hypertrophic, dystrophic, elongated, brittle, discolored 10. There is tenderness overlying the nails 1-5 bilaterally. There is no surrounding erythema or drainage along the nail sites. Hyperkerotic lesions bilateral sub-hallux although more minimal today. Upon debridement of the lesions, no underlying ulceration, drainage, or other signs of infection.  No pain with calf compression, swelling, warmth, erythema.  Assessment: Patient presents with symptomatic onychomycosis; hyperkerotic lesions  Plan: -Treatment options including alternatives, risks, complications were discussed -Nails sharply debrided 10 without complication/bleeding. -Hyperkerotic lesions sharply debrided x 2 without complications/bleeding.  Continue offloading and new pads were given today.  -Follow-up in 3 months for routine care or sooner if any problems are to arise. In the meantime, encouraged to call the office with any questions, concerns, changes symptoms.  Celesta Gentile, DPM

## 2017-12-20 DIAGNOSIS — R69 Illness, unspecified: Secondary | ICD-10-CM | POA: Diagnosis not present

## 2018-01-13 DIAGNOSIS — R69 Illness, unspecified: Secondary | ICD-10-CM | POA: Diagnosis not present

## 2018-01-18 DIAGNOSIS — H401233 Low-tension glaucoma, bilateral, severe stage: Secondary | ICD-10-CM | POA: Diagnosis not present

## 2018-01-23 ENCOUNTER — Encounter (HOSPITAL_COMMUNITY): Payer: Self-pay

## 2018-01-23 ENCOUNTER — Observation Stay (HOSPITAL_COMMUNITY)
Admission: EM | Admit: 2018-01-23 | Discharge: 2018-01-24 | Disposition: A | Discharge status: Home / Self Care | Payer: Medicare HMO | Attending: Emergency Medicine | Admitting: Emergency Medicine

## 2018-01-23 ENCOUNTER — Emergency Department (HOSPITAL_COMMUNITY): Payer: Medicare HMO

## 2018-01-23 DIAGNOSIS — R29818 Other symptoms and signs involving the nervous system: Secondary | ICD-10-CM | POA: Diagnosis not present

## 2018-01-23 DIAGNOSIS — Z794 Long term (current) use of insulin: Secondary | ICD-10-CM | POA: Diagnosis not present

## 2018-01-23 DIAGNOSIS — E1149 Type 2 diabetes mellitus with other diabetic neurological complication: Secondary | ICD-10-CM

## 2018-01-23 DIAGNOSIS — R569 Unspecified convulsions: Principal | ICD-10-CM | POA: Insufficient documentation

## 2018-01-23 DIAGNOSIS — G40909 Epilepsy, unspecified, not intractable, without status epilepticus: Secondary | ICD-10-CM | POA: Diagnosis not present

## 2018-01-23 DIAGNOSIS — R000 Tachycardia, unspecified: Secondary | ICD-10-CM | POA: Diagnosis not present

## 2018-01-23 DIAGNOSIS — N183 Chronic kidney disease, stage 3 unspecified: Secondary | ICD-10-CM | POA: Diagnosis present

## 2018-01-23 DIAGNOSIS — D72829 Elevated white blood cell count, unspecified: Secondary | ICD-10-CM | POA: Diagnosis not present

## 2018-01-23 DIAGNOSIS — R0902 Hypoxemia: Secondary | ICD-10-CM | POA: Diagnosis not present

## 2018-01-23 DIAGNOSIS — R404 Transient alteration of awareness: Secondary | ICD-10-CM | POA: Diagnosis not present

## 2018-01-23 DIAGNOSIS — I130 Hypertensive heart and chronic kidney disease with heart failure and stage 1 through stage 4 chronic kidney disease, or unspecified chronic kidney disease: Secondary | ICD-10-CM | POA: Insufficient documentation

## 2018-01-23 DIAGNOSIS — E119 Type 2 diabetes mellitus without complications: Secondary | ICD-10-CM

## 2018-01-23 DIAGNOSIS — E1142 Type 2 diabetes mellitus with diabetic polyneuropathy: Secondary | ICD-10-CM | POA: Diagnosis not present

## 2018-01-23 DIAGNOSIS — E1169 Type 2 diabetes mellitus with other specified complication: Secondary | ICD-10-CM | POA: Diagnosis not present

## 2018-01-23 DIAGNOSIS — Z7982 Long term (current) use of aspirin: Secondary | ICD-10-CM | POA: Insufficient documentation

## 2018-01-23 DIAGNOSIS — G4489 Other headache syndrome: Secondary | ICD-10-CM | POA: Diagnosis not present

## 2018-01-23 DIAGNOSIS — Z79899 Other long term (current) drug therapy: Secondary | ICD-10-CM | POA: Diagnosis not present

## 2018-01-23 DIAGNOSIS — I5022 Chronic systolic (congestive) heart failure: Secondary | ICD-10-CM | POA: Diagnosis not present

## 2018-01-23 DIAGNOSIS — R4182 Altered mental status, unspecified: Secondary | ICD-10-CM | POA: Diagnosis not present

## 2018-01-23 DIAGNOSIS — E1129 Type 2 diabetes mellitus with other diabetic kidney complication: Secondary | ICD-10-CM

## 2018-01-23 DIAGNOSIS — I959 Hypotension, unspecified: Secondary | ICD-10-CM | POA: Insufficient documentation

## 2018-01-23 LAB — CBC
HEMATOCRIT: 39 % (ref 39.0–52.0)
Hemoglobin: 12.5 g/dL — ABNORMAL LOW (ref 13.0–17.0)
MCH: 32 pg (ref 26.0–34.0)
MCHC: 32.1 g/dL (ref 30.0–36.0)
MCV: 99.7 fL (ref 80.0–100.0)
Platelets: 177 10*3/uL (ref 150–400)
RBC: 3.91 MIL/uL — AB (ref 4.22–5.81)
RDW: 12.2 % (ref 11.5–15.5)
WBC: 15.6 10*3/uL — ABNORMAL HIGH (ref 4.0–10.5)
nRBC: 0 % (ref 0.0–0.2)

## 2018-01-23 LAB — I-STAT CHEM 8, ED
BUN: 31 mg/dL — AB (ref 8–23)
CALCIUM ION: 1.16 mmol/L (ref 1.15–1.40)
CHLORIDE: 105 mmol/L (ref 98–111)
CREATININE: 1.4 mg/dL — AB (ref 0.61–1.24)
GLUCOSE: 118 mg/dL — AB (ref 70–99)
HCT: 37 % — ABNORMAL LOW (ref 39.0–52.0)
Hemoglobin: 12.6 g/dL — ABNORMAL LOW (ref 13.0–17.0)
POTASSIUM: 4 mmol/L (ref 3.5–5.1)
Sodium: 137 mmol/L (ref 135–145)
TCO2: 15 mmol/L — ABNORMAL LOW (ref 22–32)

## 2018-01-23 LAB — COMPREHENSIVE METABOLIC PANEL
ALBUMIN: 3.4 g/dL — AB (ref 3.5–5.0)
ALT: 23 U/L (ref 0–44)
AST: 38 U/L (ref 15–41)
Alkaline Phosphatase: 55 U/L (ref 38–126)
Anion gap: 17 — ABNORMAL HIGH (ref 5–15)
BILIRUBIN TOTAL: 0.8 mg/dL (ref 0.3–1.2)
BUN: 29 mg/dL — AB (ref 8–23)
CO2: 14 mmol/L — AB (ref 22–32)
CREATININE: 1.66 mg/dL — AB (ref 0.61–1.24)
Calcium: 8.9 mg/dL (ref 8.9–10.3)
Chloride: 104 mmol/L (ref 98–111)
GFR calc Af Amer: 43 mL/min — ABNORMAL LOW (ref >60)
GFR calc non Af Amer: 37 mL/min — ABNORMAL LOW (ref >60)
GLUCOSE: 129 mg/dL — AB (ref 70–99)
Potassium: 3.9 mmol/L (ref 3.5–5.1)
SODIUM: 135 mmol/L (ref 135–145)
TOTAL PROTEIN: 6.5 g/dL (ref 6.5–8.1)

## 2018-01-23 LAB — DIFFERENTIAL
ABS IMMATURE GRANULOCYTES: 0.04 10*3/uL (ref 0.00–0.07)
Basophils Absolute: 0.1 10*3/uL (ref 0.0–0.1)
Basophils Relative: 0 %
Eosinophils Absolute: 0.6 10*3/uL — ABNORMAL HIGH (ref 0.0–0.5)
Eosinophils Relative: 4 %
IMMATURE GRANULOCYTES: 0 %
LYMPHS ABS: 7.4 10*3/uL — AB (ref 0.7–4.0)
LYMPHS PCT: 48 %
Monocytes Absolute: 1.2 10*3/uL — ABNORMAL HIGH (ref 0.1–1.0)
Monocytes Relative: 8 %
NEUTROS ABS: 6.3 10*3/uL (ref 1.7–7.7)
Neutrophils Relative %: 40 %

## 2018-01-23 LAB — GLUCOSE, CAPILLARY: Glucose-Capillary: 109 mg/dL — ABNORMAL HIGH (ref 70–99)

## 2018-01-23 LAB — I-STAT TROPONIN, ED: Troponin i, poc: 0.03 ng/mL (ref 0.00–0.08)

## 2018-01-23 LAB — APTT: aPTT: 29 seconds (ref 24–36)

## 2018-01-23 LAB — PROTIME-INR
INR: 1.17
PROTHROMBIN TIME: 14.8 s (ref 11.4–15.2)

## 2018-01-23 LAB — CBG MONITORING, ED: Glucose-Capillary: 108 mg/dL — ABNORMAL HIGH (ref 70–99)

## 2018-01-23 MED ORDER — ENOXAPARIN SODIUM 40 MG/0.4ML ~~LOC~~ SOLN
40.0000 mg | SUBCUTANEOUS | Status: DC
  Administered 2018-01-23: 40 mg via SUBCUTANEOUS
  Filled 2018-01-23: qty 0.4

## 2018-01-23 MED ORDER — ACETAMINOPHEN 325 MG PO TABS: 650.0000 mg | ORAL_TABLET | Freq: Four times a day (QID) | ORAL | Status: DC | PRN

## 2018-01-23 MED ORDER — ASPIRIN EC 81 MG PO TBEC
81.0000 mg | DELAYED_RELEASE_TABLET | Freq: Every day | ORAL | Status: DC
  Administered 2018-01-23 – 2018-01-24 (×2): 81 mg via ORAL
  Filled 2018-01-23 (×2): qty 1

## 2018-01-23 MED ORDER — INSULIN ASPART 100 UNIT/ML ~~LOC~~ SOLN: 0.0000 [IU] | Freq: Three times a day (TID) | SUBCUTANEOUS | Status: DC

## 2018-01-23 MED ORDER — FINASTERIDE 5 MG PO TABS
5.0000 mg | ORAL_TABLET | Freq: Every day | ORAL | Status: DC
  Administered 2018-01-24: 5 mg via ORAL
  Filled 2018-01-23: qty 1

## 2018-01-23 MED ORDER — ONDANSETRON HCL 4 MG/2ML IJ SOLN: 4.0000 mg | Freq: Four times a day (QID) | INTRAMUSCULAR | Status: DC | PRN

## 2018-01-23 MED ORDER — SODIUM CHLORIDE 0.9 % IV SOLN
INTRAVENOUS | Status: DC
  Administered 2018-01-23: 16:00:00 via INTRAVENOUS

## 2018-01-23 MED ORDER — SODIUM CHLORIDE 0.9 % IV SOLN
INTRAVENOUS | Status: AC
  Administered 2018-01-24: 05:00:00 via INTRAVENOUS

## 2018-01-23 MED ORDER — SODIUM CHLORIDE 0.9 % IV BOLUS
1000.0000 mL | Freq: Once | INTRAVENOUS | Status: AC
  Administered 2018-01-23: 1000 mL via INTRAVENOUS

## 2018-01-23 MED ORDER — INSULIN ASPART 100 UNIT/ML ~~LOC~~ SOLN: 0.0000 [IU] | Freq: Every day | SUBCUTANEOUS | Status: DC

## 2018-01-23 MED ORDER — ROSUVASTATIN CALCIUM 10 MG PO TABS
10.0000 mg | ORAL_TABLET | Freq: Every evening | ORAL | Status: DC
  Administered 2018-01-23: 10 mg via ORAL
  Filled 2018-01-23 (×2): qty 1

## 2018-01-23 MED ORDER — CITALOPRAM HYDROBROMIDE 10 MG PO TABS
10.0000 mg | ORAL_TABLET | Freq: Every day | ORAL | Status: DC
  Administered 2018-01-23 – 2018-01-24 (×2): 10 mg via ORAL
  Filled 2018-01-23 (×2): qty 1

## 2018-01-23 MED ORDER — HALOPERIDOL LACTATE 5 MG/ML IJ SOLN
2.0000 mg | Freq: Once | INTRAMUSCULAR | Status: AC
  Administered 2018-01-23: 2 mg via INTRAVENOUS
  Filled 2018-01-23: qty 1

## 2018-01-23 MED ORDER — LEVETIRACETAM IN NACL 1000 MG/100ML IV SOLN
1000.0000 mg | Freq: Once | INTRAVENOUS | Status: AC
  Administered 2018-01-23: 1000 mg via INTRAVENOUS
  Filled 2018-01-23: qty 100

## 2018-01-23 MED ORDER — LEVETIRACETAM 500 MG PO TABS
500.0000 mg | ORAL_TABLET | Freq: Two times a day (BID) | ORAL | Status: DC
  Administered 2018-01-23 – 2018-01-24 (×2): 500 mg via ORAL
  Filled 2018-01-23 (×2): qty 1

## 2018-01-23 MED ORDER — ONDANSETRON HCL 4 MG PO TABS: 4.0000 mg | ORAL_TABLET | Freq: Four times a day (QID) | ORAL | Status: DC | PRN

## 2018-01-23 MED ORDER — INSULIN GLARGINE 100 UNIT/ML ~~LOC~~ SOLN
10.0000 [IU] | Freq: Every day | SUBCUTANEOUS | Status: DC
  Filled 2018-01-23 (×2): qty 0.1

## 2018-01-23 MED ORDER — ACETAMINOPHEN 650 MG RE SUPP: 650.0000 mg | Freq: Four times a day (QID) | RECTAL | Status: DC | PRN

## 2018-01-23 MED ORDER — LORAZEPAM 2 MG/ML IJ SOLN: 1.0000 mg | INTRAMUSCULAR | Status: DC | PRN

## 2018-01-23 NOTE — ED Notes (Signed)
Bed control aware 5C is not appropriate

## 2018-01-23 NOTE — H&P (Signed)
History and Physical  Patient Name: Daniel [name]     JSH:[id number]    DOB: 05/08/1933    DOA: 01/23/2018 PCP: Jani Gravel, MD  Patient coming from: Home  Chief Complaint: Seizure      HPI: Daniel [name] is a 84 y.o. male with hx seizures, IDDM, HTN, prostate cancer, CAD s/p PCI 2004 and sCHF who presents with seizure.  All history collected from wife, as patient is sedated and too sluggish to answer questions.  The patient was in his usual state of health until today when he was watching the football game, his wife noticed he was unable to work the remote.  She asked him a question and he was "just staring past her into space".  She wanted to check his blood pressure, but he couldn't put out his arm, so she called 9-1-1 who began to bring the patient in as [place] when he had a generalized tonic-clonic seizure en route, received Versed which aborts the seizure.  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  At that time required intubation on admission, but was quickly extubated.  MRI brain showed no new findings, but EEG showed a likely focus.  He was discharged with Keppra and a Neurology appointment, but he stopped the [place] because he did not like the liquid, and refused to go to the Neurology appointment, and so has been off [place] since March.     ROS: Review of Systems  Unable to perform ROS: Other    Patient is too sluggish from Versed and post-ictal state to reliably answer ROS.      Past Medical History:  Diagnosis Date  . Alcohol abuse   . Cancer [facility])    prostate  .  Cardiomyopathy ([place])   . CHF (congestive heart failure) ([place])   . Complication of anesthesia    Hx of seizure when put to sleep for surgery on 08/2009  . Constipation   . Coronary artery disease   . Diabetes mellitus without complication ([place])   . H/O hiatal hernia   . Hyperlipidemia   . Hypertension   . Peripheral neuropathy   . Seizures ([place])   . Urinary incontinence     Past Surgical History:  Procedure Laterality Date  . CATARACT EXTRACTION, BILATERAL    . CORONARY ANGIOPLASTY     stents x 2  . CORONARY STENT PLACEMENT     2004  . PROSTATE SURGERY  2008   SEED IMPLANTS  . RADIOACTIVE SEED IMPLANT     prostate  . TONSILLECTOMY     1940's    Social History: Patient lives at home with his wife.  The patient walks walker in the house, wheelchair outside.  Former smoker.  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  No dementia diagnosed, but has some memory impairment, requires assistance with bathing, dressing only.  No Known Allergies  Family history: family history includes Diabetes in his brother and mother; Heart disease in his brother and father.  Prior to Admission medications   Medication Sig Start Date End Date Taking? Authorizing Provider  aspirin 81 MG tablet Take 81 mg by mouth daily.    Yes [provider]  B Complex-C (SUPER B COMPLEX PO) Take 1 tablet by mouth daily.   Yes [provider]  citalopram (CELEXA) 10 MG tablet Take 10 mg by mouth daily.    Yes [provider]  finasteride (PROSCAR) 5 MG tablet Take 5 mg by mouth daily.  01/09/13  Yes [provider]  hydrocortisone 2.5 % cream Apply 1 application topically as needed (face).  05/15/14  Yes [provider]  insulin aspart (NOVOLOG) 100 UNIT/ML injection Inject 0-9 Units into the skin 3 (three) times daily with meals. Patient taking differently: Inject 10 Units into the skin 2 (two) times daily. 10 mg before breakfast and 10 mg at bedtime 06/19/17  Yes Danford,  Suann Larry, MD  Insulin Degludec (TRESIBA) 100 UNIT/ML SOLN Inject 25 Units into the skin at bedtime.   Yes [provider]  latanoprost (XALATAN) 0.005 % ophthalmic solution Place 1 drop into both eyes at bedtime.   Yes [provider]  losartan (COZAAR) 50 MG tablet Take 50 mg by mouth daily. 10/24/17  Yes [provider]  Multiple Vitamin (MULTIVITAMIN) tablet Take 1 tablet by mouth every evening.    Yes [provider]  rosuvastatin (CRESTOR) 20 MG tablet Take 10 mg by mouth every evening.    Yes [provider]  vitamin C (ASCORBIC ACID) 500 MG tablet Take 500 mg by mouth every evening.    Yes [provider]       Physical Exam: BP 121/60   Pulse 75   Temp (!) 97.3 F (36.3 C) (Temporal)   Resp 16   Ht 5\' 8"  (1.727 m) Comment: per wife  Wt 82.4 kg   SpO2 97%   BMI 27.62 kg/m  General appearance: Thin elderly adult male, sedated, sluggish.  Rouses to stimuli, then falls back asleep.   Eyes: Anicteric, conjunctiva pink, lids and lashes normal. PERRL, pupils small..    ENT: No nasal deformity, discharge, epistaxis.  Hearing normal. OP moist without lesions.  Dentition normal, lips normal.   Neck: No neck masses.  Trachea midline.  No thyromegaly/tenderness. Lymph: No cervical or supraclavicular lymphadenopathy. Skin: Warm and dry.  No jaundice.  No suspicious rashes or lesions. Cardiac: RRR, nl S1-S2, no murmurs appreciated.  Capillary refill is brisk.  JVP not visible.  No LE edema.  Radial pulses 2+ and symmetric. Respiratory: Normal respiratory rate and rhythm.  CTAB without rales or wheezes. Abdomen: Abdomen soft.  No TTP. No ascites, distension, hepatosplenomegaly.   MSK: No deformities or effusions in large joints, diffuse loss of subQ fat and muscle mass.  No cyanosis or clubbing. Neuro: Cranial nerves seem symmetric.  Upper extremity strength is weak but symmetric on my exam. Speech is slurred due to sedation.      Psych: Sleeping, rouses briefly, then falls back asleep.     Labs on Admission:  I have personally reviewed following labs and imaging studies: CBC: Recent Labs  Lab 01/23/18 1423 01/23/18 1430  WBC 15.6*  --   NEUTROABS 6.3  --   HGB 12.5* 12.6*  HCT 39.0 37.0*  MCV 99.7  --   PLT 177  --    Basic Metabolic Panel: Recent Labs  Lab 01/23/18 1423 01/23/18 1430  NA 135 137  K 3.9 4.0  CL 104 105  CO2 14*  --   GLUCOSE 129* 118*  BUN 29* 31*  CREATININE  1.66* 1.40*  CALCIUM 8.9  --    GFR: Estimated Creatinine Clearance: 42.6 mL/min (A) (by C-G formula based on SCr of 1.4 mg/dL (H)).  Liver Function Tests: Recent Labs  Lab 01/23/18 1423  AST 38  ALT 23  ALKPHOS 55  BILITOT 0.8  PROT 6.5  ALBUMIN 3.4*   No results for input(s): LIPASE, AMYLASE in the last 168 hours. No results for input(s): AMMONIA in the last 168 hours. Coagulation Profile: Recent Labs  Lab 01/23/18 1423  INR 1.17   Cardiac Enzymes: No results for input(s): CKTOTAL, CKMB, CKMBINDEX, TROPONINI in the last 168 hours. BNP (last 3 results) No results for input(s): PROBNP in the last 8760 hours. HbA1C: No results for input(s): HGBA1C in the last 72 hours. CBG: Recent Labs  Lab 01/23/18 1420  GLUCAP 108*   Lipid Profile: No results for input(s): CHOL, HDL, LDLCALC, TRIG, CHOLHDL, LDLDIRECT in the last 72 hours. Thyroid Function Tests: No results for input(s): TSH, T4TOTAL, FREET4, T3FREE, THYROIDAB in the last 72 hours. Anemia Panel: No results for input(s): VITAMINB12, FOLATE, FERRITIN, TIBC, IRON, RETICCTPCT in the last 72 hours. Sepsis Labs:  Invalid input(s): PROCALCITONIN, LACTICACIDVEN No results found for this or any previous visit (from the past 240 hour(s)).       Radiological Exams on Admission: Personally reviewed CXR shows no focal airspace disease or opacity, CT head report reviewed: Dg Chest Port 1 View  Result Date: 01/23/2018 CLINICAL DATA:  Altered mental  status EXAM: PORTABLE CHEST 1 VIEW COMPARISON:  07/19/2017 FINDINGS: Cardiac shadow is at the upper limits of normal in size. Aortic calcifications are again noted. The lungs are hypoinflated although no focal infiltrate or sizable effusion is seen. IMPRESSION: Poor inspiratory effort without acute abnormality. Electronically Signed   By: Inez Catalina M.D.   On: 01/23/2018 15:02   Ct Head Code Stroke Wo Contrast  Result Date: 01/23/2018 CLINICAL DATA:  Code stroke. Right-sided weakness. Right-sided gaze. EXAM: CT HEAD WITHOUT CONTRAST TECHNIQUE: Contiguous axial images were obtained from the base of the skull through the vertex without intravenous contrast. COMPARISON:  CT head without contrast 07/19/2017. FINDINGS: Brain: Advanced atrophy and white matter disease is again seen. No acute cortical infarct or hemorrhage is present. Diffuse white matter disease is stable. Basal ganglia are intact. Insular ribbon is normal. No significant extra-axial fluid collection is present. The brainstem and cerebellum are within limits. Vascular: Atherosclerotic calcifications are present within the cavernous internal carotid arteries bilaterally. There is no hyperdense vessel. Skull: Calvarium is intact. No focal lytic blastic lesions are present. Acute or healing fractures are present. Sinuses/Orbits: Chronic wall thickening is present about the right maxillary sinus. There is mild mucosal thickening is well. Nasal intubation is noted. Chronic left sphenoid mucosal disease is present as well. The mastoid air cells are clear. ASPECTS [facility] Stroke Program Early CT Score) - Ganglionic level infarction (caudate, lentiform nuclei, internal capsule, insula, M1-M3 cortex): 7/7 - Supraganglionic infarction (M4-M6 cortex): 3/3 Total score (0-10 with 10 being normal): 10/10 IMPRESSION: 1. Advanced atrophy and white matter disease without acute cortical abnormality. 2. Atherosclerosis 3. Nasal intubation 4. ASPECTS is 10/10 The  above was relayed via text pager to Dr. Leonel Ramsay on 01/23/2018 at 14:39 . Electronically Signed   By: San Morelle M.D.   On: 01/23/2018 14:40    EKG: Independently reviewed. ECG shows old LAFB, sinus rhythm.    Assessment/Plan  Seizure:  Due to epilepsy and non-adherence to medication.  Patient has no dementia, but  wife states he is very stubborn and refuses to take [place] or follow up with Neurology.  Hopefully, this repeat event will make manifest the natural history of his illness likely if he does not use AEDs. -[facility] -Seizure precautions ordered -Neuro checks q4hrs overnight   Diabetes:  -Continue Tresiba, slightly lower dose until taking PO -SSI ordered  CAD Chronic systolic CHF Hypertension Hypotension:  Hypontension from Versed, resolving in ER without intervention.  Hx of CAD and EF 40%, most recently EF normalized. -Hold losartan -Continue Crestor, aspirin  CKD III - IV:  Baseline Cr 1.4-1.7, at baseline. -Hold losartan due to hypotension  Elevated anion gap:  Likely from lactic acidosis from seizure.  Glucose normal. -IVF overnight and repeat BMP  Leukocytosis:  From seizure, doubt infection  Other medication -Continue Proscar -Continue celexa     DVT prophylaxis: Lovenox  Code Status: DO NOT RESUSCITATE  Family Communication: Wife  Disposition Plan: Anticipate overnight observation, neuro checks.  If neurologically intact in AM, and no further siezures, will discharge home with Neuro follow up Consults called: Neurology Admission status: OBS At the point of initial evaluation, it is my clinical opinion that admission for OBSERVATION is reasonable and necessary because the patient's presenting complaints in the context of their chronic conditions represent sufficient risk of deterioration or significant morbidity to constitute reasonable grounds for close observation in the hospital setting, but that the patient  may be medically stable for discharge from the hospital within 24 to 48 hours.    Medical decision making: Patient seen at 4:31 PM on 01/23/2018.  The patient was discussed with Dr. Maryan Rued.  What exists of the patient's chart was reviewed in depth and summarized above.  Clinical condition: stable.        Edwin Dada [facility] Pager [phone]

## 2018-01-23 NOTE — ED Notes (Signed)
Report attempted 

## 2018-01-23 NOTE — ED Notes (Signed)
Pt pulling at leads, removed NRB, soft mitts ordered from materials okay per Dr. Maryan Rued

## 2018-01-23 NOTE — ED Notes (Signed)
Pt reacting to painful stimuli

## 2018-01-23 NOTE — ED Provider Notes (Signed)
[facility]ERGENCY DEPARTMENT Provider Note   CSN: [id number] Arrival date & time: 01/23/18  1418     History   Chief Complaint Chief Complaint  Patient presents with  . Code Stroke    HPI Daniel [name] is a 84 y.o. male.  Patient is an 84 year old gentleman with a history of cardiomyopathy with EF of 55%, coronary artery disease, prior seizures and hypertension presenting today as a code stroke.  Wife states that they were having a normal day when approximately 1:00 patient started acting abnormally.  Wife states he was stating things that did not make sense that he had weakness on his right side.  She called 911 and when they arrived he was not responding and eyes were deviated.  He then had a generalized seizure in the ambulance.  He did receive 5 mg of Versed.  He has been satting 100% on nonrebreather.  He has not been following any commands.  Wife states a very similar thing happened in April.  She denies that he is on any type of seizure medication at this time.  Otherwise he has been having normal days over the last week.  She states he always seems dehydrated because she does not think he drinks enough but was eating normally.  Looking through medical record notes from April of this year patient had a very similar event.  He had negative MRI, MRA.  Patient was started on Keppra twice daily however wife states he refused to go to the neurologist and he is no longer taking that medication.  Also he has been sober from alcohol for approximately 5 years.  The history is provided by the spouse and the EMS personnel.    Past Medical History:  Diagnosis Date  . Alcohol abuse   . Cancer [facility])    prostate  . Cardiomyopathy ([place])   . CHF (congestive heart failure) ([place])   . Complication of anesthesia    Hx of seizure when put to sleep for surgery on 08/2009  . Constipation   . Coronary artery disease   . Diabetes mellitus without complication ([place])   . H/O  hiatal hernia   . Hyperlipidemia   . Hypertension   . Peripheral neuropathy   . Seizures ([place])   . Urinary incontinence     Patient Active Problem List   Diagnosis Date Noted  . Hypoglycemia 07/19/2017  . Hypotension 07/19/2017  . Normochromic normocytic anemia 07/19/2017  . Thrombocytopenia ([place]) 07/19/2017  . CKD (chronic kidney disease), stage III ([place]) 07/19/2017  . Seizure ([place]) 06/16/2017  . Type II diabetes mellitus with neurological manifestations ([place]) 03/05/2015  . Porokeratosis 03/05/2015  . Chronic systolic CHF (congestive heart failure) ([place]) 01/28/2015  . Coronary artery disease 01/28/2015  . Bruit of left carotid artery 01/28/2015  . 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 H/O detached retina repair 08/23/2014  . Acute on chronic renal failure ([place]) 04/17/2013  . Dehydration 04/17/2013  . Alcohol dependence ([place]) 04/17/2013  . Transaminasemia 04/17/2013  . DM2 (diabetes mellitus, type 2) ([place]) 04/17/2013  . Near syncope 04/17/2013  . UTI (lower urinary tract infection) 04/17/2013  . Hematuria 04/17/2013    Past Surgical History:  Procedure Laterality Date  . CATARACT EXTRACTION, BILATERAL    . CORONARY ANGIOPLASTY     stents x 2  . CORONARY STENT PLACEMENT     2004  . PROSTATE SURGERY  2008   SEED IMPLANTS  . RADIOACTIVE SEED IMPLANT  prostate  . TONSILLECTOMY     1940's        Home Medications    Prior to Admission medications   Medication Sig Start Date End Date Taking? Authorizing Provider  aspirin 81 MG tablet Take 162 mg by mouth daily.     [provider]  B Complex-C (SUPER B COMPLEX PO) Take 1 tablet by mouth daily.    [provider]  citalopram (CELEXA) 10 MG tablet Take 10 mg by mouth daily.     [provider]  finasteride (PROSCAR) 5 MG tablet Take 5 mg by mouth daily.  01/09/13   [provider]  hydrocortisone 2.5 % cream Apply 1 application topically as needed (face).   05/15/14   [provider]  insulin aspart (NOVOLOG) 100 UNIT/ML injection Inject 0-9 Units into the skin 3 (three) times daily with meals. Patient taking differently: Inject 10-30 Units into the skin 3 (three) times daily with meals. Per sliding scale 06/19/17   Danford, Suann Larry, MD  Insulin Degludec (TRESIBA) 100 UNIT/ML SOLN Inject 25 Units into the skin at bedtime.    [provider]  latanoprost (XALATAN) 0.005 % ophthalmic solution Place 1 drop into both eyes at bedtime.    [provider]  levETIRAcetam (KEPPRA) 100 MG/ML solution Take 7.5 mLs (750 mg total) by mouth 2 (two) times daily. 06/19/17   Danford, Suann Larry, MD  Multiple Vitamin (MULTIVITAMIN) tablet Take 1 tablet by mouth daily.    [provider]  rosuvastatin (CRESTOR) 20 MG tablet Take 10 mg by mouth daily.     [provider]  vitamin C (ASCORBIC ACID) 500 MG tablet Take 500 mg by mouth daily.    [provider]    Family History Family History  Problem Relation Age of Onset  . Diabetes Mother   . Heart disease Father   . Heart disease Brother   . Diabetes Brother     Social History Social History   Tobacco Use  . Smoking status: Never Smoker  . Smokeless tobacco: Never Used  Substance Use Topics  . Alcohol use: Yes  . Drug use: No     Allergies   Patient has no known allergies.   Review of Systems Review of Systems  All other systems reviewed and are negative.    Physical Exam Updated Vital Signs There were no vitals taken for this visit.  Physical Exam  Constitutional: He appears well-developed and well-nourished. He appears lethargic. No distress.  HENT:  Head: Normocephalic and atraumatic.  Mouth/Throat: Oropharynx is clear and moist.  No tongue biting  Eyes: Pupils are equal, round, and reactive to light. Conjunctivae and EOM are normal.  No deviation of the eyes.  Pupils are approximately 2 cm and reactive  Neck: Normal range  of motion. Neck supple.  Cardiovascular: Normal rate, regular rhythm and intact distal pulses.  No murmur heard. Pulmonary/Chest: Effort normal and breath sounds normal. No respiratory distress. He has no wheezes. He has no rales.  Abdominal: Soft. He exhibits no distension. There is no tenderness. There is no rebound and no guarding.  Musculoskeletal: Normal range of motion. He exhibits no edema or tenderness.  Neurological: He appears lethargic.  Currently patient is not following commands but is breathing spontaneously.  Unknown if there is any weakness or sensation changes patient is not able to verbalize.  No facial droop noted  Skin: Skin is warm and dry. No rash noted. No erythema.  Psychiatric:  Patient  is lethargic and not able to answer questions or follow commands  Nursing note and vitals reviewed.    ED Treatments / Results  Labs (all labs ordered are listed, but only abnormal results are displayed) Labs Reviewed  CBC - Abnormal; Notable for the following components:      Result Value   WBC 15.6 (*)    RBC 3.91 (*)    Hemoglobin 12.5 (*)    All other components within normal limits  DIFFERENTIAL - Abnormal; Notable for the following components:   Lymphs Abs 7.4 (*)    Monocytes Absolute 1.2 (*)    Eosinophils Absolute 0.6 (*)    All other components within normal limits  COMPREHENSIVE METABOLIC PANEL - Abnormal; Notable for the following components:   CO2 14 (*)    Glucose, Bld 129 (*)    BUN 29 (*)    Creatinine, Ser 1.66 (*)    Albumin 3.4 (*)    GFR calc non Af Amer 37 (*)    GFR calc Af Amer 43 (*)    Anion gap 17 (*)    All other components within normal limits  CBG MONITORING, ED - Abnormal; Notable for the following components:   Glucose-Capillary 108 (*)    All other components within normal limits  I-STAT CHEM 8, ED - Abnormal; Notable for the following components:   BUN 31 (*)    Creatinine, Ser 1.40 (*)    Glucose, Bld 118 (*)    TCO2 15 (*)     Hemoglobin 12.6 (*)    HCT 37.0 (*)    All other components within normal limits  PROTIME-INR  APTT  URINALYSIS, ROUTINE W REFLEX MICROSCOPIC  I-STAT TROPONIN, ED    EKG EKG Interpretation  Date/Time:  Sunday January 23 2018 14:40:56 EST Ventricular Rate:  89 PR Interval:    QRS Duration: 112 QT Interval:  401 QTC Calculation: 488 R Axis:   -73 Text Interpretation:  Sinus rhythm Left anterior fascicular block Low voltage, precordial leads Consider anterior infarct No significant change since last tracing Confirmed by Blanchie Dessert ([id number]) on 01/23/2018 3:12:31 PM   Radiology Dg Chest Port 1 View  Result Date: 01/23/2018 CLINICAL DATA:  Altered mental status EXAM: PORTABLE CHEST 1 VIEW COMPARISON:  07/19/2017 FINDINGS: Cardiac shadow is at the upper limits of normal in size. Aortic calcifications are again noted. The lungs are hypoinflated although no focal infiltrate or sizable effusion is seen. IMPRESSION: Poor inspiratory effort without acute abnormality. Electronically Signed   By: Inez Catalina M.D.   On: 01/23/2018 15:02   Ct Head Code Stroke Wo Contrast  Result Date: 01/23/2018 CLINICAL DATA:  Code stroke. Right-sided weakness. Right-sided gaze. EXAM: CT HEAD WITHOUT CONTRAST TECHNIQUE: Contiguous axial images were obtained from the base of the skull through the vertex without intravenous contrast. COMPARISON:  CT head without contrast 07/19/2017. FINDINGS: Brain: Advanced atrophy and white matter disease is again seen. No acute cortical infarct or hemorrhage is present. Diffuse white matter disease is stable. Basal ganglia are intact. Insular ribbon is normal. No significant extra-axial fluid collection is present. The brainstem and cerebellum are within limits. Vascular: Atherosclerotic calcifications are present within the cavernous internal carotid arteries bilaterally. There is no hyperdense vessel. Skull: Calvarium is intact. No focal lytic blastic lesions are present.  Acute or healing fractures are present. Sinuses/Orbits: Chronic wall thickening is present about the right maxillary sinus. There is mild mucosal thickening is well. Nasal intubation is noted. Chronic left sphenoid mucosal  disease is present as well. The mastoid air cells are clear. ASPECTS [facility] Stroke Program Early CT Score) - Ganglionic level infarction (caudate, lentiform nuclei, internal capsule, insula, M1-M3 cortex): 7/7 - Supraganglionic infarction (M4-M6 cortex): 3/3 Total score (0-10 with 10 being normal): 10/10 IMPRESSION: 1. Advanced atrophy and white matter disease without acute cortical abnormality. 2. Atherosclerosis 3. Nasal intubation 4. ASPECTS is 10/10 The above was relayed via text pager to Dr. Leonel Ramsay on 01/23/2018 at 14:39 . Electronically Signed   By: San Morelle M.D.   On: 01/23/2018 14:40    Procedures Procedures (including critical care time)  Medications Ordered in ED Medications  levETIRAcetam (KEPPRA) IVPB 1000 mg/100 mL premix (has no administration in time range)     Initial Impression / Assessment and Plan / ED Course  I have reviewed the triage vital signs and the nursing notes.  Pertinent labs & imaging results that were available during my care of the patient were reviewed by me and considered in my medical decision making (see chart for details).     Elderly male with multiple medical problems presenting today as a code stroke however did have a seizure in route to the hospital.  Feel that patient may be postictal at this time and strokelike symptoms related to Todd's paralysis.  Patient had a very similar event in April and at that time had negative imaging.  Patient was supposed to be on twice daily Keppra but he refused to follow-up with a neurologist and has not had any medication.  Currently patient is protecting his airway but is not responding.  Neurology at bedside.  Patient went for CT. Pt is hypotensive here in the 70's and 80's with  maps in the 60's and pt bolused.  He is starting to wake up and does have purposeful movement.  3:47 PM Patient's labs are not significantly changed from baseline.  Head CT without acute findings and chest x-ray just showed poor inspiration.  After boluses patient's blood pressure is slowly improving.  Now it is in the low 100s and 90s.  We will continue to watch for the next 30 to 40 minutes to ensure her blood pressure remained stable.  Patient is still extremely somnolent most likely due to the Versed and being postictal.  Plan will be to admit.  Patient has received IV Keppra.  CRITICAL CARE Performed by: Octavion Mollenkopf Total critical care time: 30 minutes Critical care time was exclusive of separately billable procedures and treating other patients. Critical care was necessary to treat or prevent imminent or life-threatening deterioration. Critical care was time spent personally by me on the following activities: development of treatment plan with patient and/or surrogate as well as nursing, discussions with consultants, evaluation of patient's response to treatment, examination of patient, obtaining history from patient or surrogate, ordering and performing treatments and interventions, ordering and review of laboratory studies, ordering and review of radiographic studies, pulse oximetry and re-evaluation of patient's condition.  Final Clinical Impressions(s) / ED Diagnoses   Final diagnoses:  Seizure ([place])  Hypotension, unspecified hypotension type    ED Discharge Orders    None       Blanchie Dessert, MD 01/23/18 1549

## 2018-01-23 NOTE — ED Triage Notes (Signed)
Pt arrived via [facility] from home family reports right side flaccid and gaze to the right then went unresponsive.  EMS reports pt had seizure like activity with them and was given 5mg  Versed.  1300 LKN, arrives on NRB 15L

## 2018-01-23 NOTE — Consult Note (Signed)
Neurology Consultation Reason for Consult: Seizures Referring Physician: Maryan Rued, W  CC: Seizures  History is obtained from: [place]  HPI: Daniel [name] is a 84 y.o. male with a history of  2 previous seizures, one in 2011, and one in March of this year.  In March, he was evaluated for abrupt onset right-sided weakness and was about to receive IV TPA when he began having clear focal seizure activity.  At that time, he required intubation and was admitted to the ICU with improvement in his symptoms.  He was started on Keppra, but refused to take this medication, I am uncertain why.  He also refused follow-up with outpatient neurologist.   Today, he was in his normal state of health earlier today, then around 1 PM he began acting funny.  He asked her to hand the remote to him, but he was clearly close enough to reach himself.  Subsequently she noticed that he was moving his right side well and EMS was called.  They noted right-sided gaze with right-sided deficits.  In transport, he began having tonic-clonic seizure.   On arrival, he is obtunded  LKW: 1 PM tpa given?: no, outside of window   ROS: Unable to assess secondary to patient's altered mental status.   Past Medical History:  Diagnosis Date  . Alcohol abuse   . Cancer [facility])    prostate  . Cardiomyopathy ([place])   . CHF (congestive heart failure) ([place])   . Complication of anesthesia    Hx of seizure when put to sleep for surgery on 08/2009  . Constipation   . Coronary artery disease   . Diabetes mellitus without complication ([place])   . H/O hiatal hernia   . Hyperlipidemia   . Hypertension   . Peripheral neuropathy   . Seizures ([place])   . Urinary incontinence      Family History  Problem Relation Age of Onset  . Diabetes Mother   . Heart disease Father   . Heart disease Brother   . Diabetes Brother      Social History:  reports that he has never smoked. He has never used smokeless tobacco. He reports that he drinks  alcohol. He reports that he does not use drugs.   Exam: Current vital signs: BP (!) 77/35   Pulse 92   Temp (!) 97.3 F (36.3 C) (Temporal)   Resp (!) 21   Wt 82.4 kg   SpO2 100%   BMI 26.83 kg/m  Vital signs in last 24 hours: Temp:  [97.3 F (36.3 C)] 97.3 F (36.3 C) (11/03 1438) Pulse Rate:  [92-96] 92 (11/03 1434) Resp:  [20-21] 21 (11/03 1434) BP: (75-77)/(23-35) 77/35 (11/03 1434) SpO2:  [100 %] 100 % (11/03 1434) Weight:  [82.4 kg] 82.4 kg (11/03 1435)   Physical Exam  Constitutional: Appears well-developed and well-nourished.  Psych: Does not answer many questions Eyes: No scleral injection HENT: No OP obstrucion Head: Normocephalic.  Cardiovascular: Normal rate and regular rhythm.  Respiratory: Effort normal, non-labored breathing GI: Soft.  No distension. There is no tenderness.  Skin: WDI  Neuro: Mental Status: Patient is obtunded, he does have improvement over the course of the examination, begins opening his eyes and starts moving his right side little bit better.  Cranial Nerves: II: Does not blink to threat pupils are equal, round, and reactive to light.   III,IV, VI: Eyes are midline he does rub across midline in both directions. V: VII: Blinks to eyelid stimulation bilaterally Motor:  He moves left side purposefully, on the right side he withdraws to noxious stimuli in both the arm and leg Sensory: He responds to noxious stimulation all 4 extremities Cerebellar: Does not perform  I have reviewed labs in epic and the results pertinent to this consultation are: Borderline elevated creatinine at 1.4 Leukocytosis of 15.6  I have reviewed the images obtained: CT head-unremarkable  Impression: 84 year old male with history of seizures, noncompliant with Keppra who has breakthrough seizure.  No clear etiology for breakthrough at this time, other than noncompliance.  Recommendations: 1) Keppra 1 g, followed by 500 mg twice daily 2) UA 3) no further  evaluation is needed unless he does not improve as expected. 4) we will follow   Roland Rack, MD [facility] [phone]  If 7pm- 7am, please page neurology on call as listed in [place].

## 2018-01-23 NOTE — ED Notes (Signed)
Pt becoming increasingly agitated

## 2018-01-23 NOTE — ED Provider Notes (Signed)
Blood pressure 93/69, pulse 83, temperature (!) 97.3 F (36.3 C), temperature source Temporal, resp. rate 18, weight 82.4 kg, SpO2 93 %.  Assuming care from Dr. Maryan Rued.  In short, Daniel [name] is a 84 y.o. male with a chief complaint of Code Stroke .  Refer to the original H&P for additional details.  The current plan of care is to f/u to ensure patient is mentally clearing and patient BP remaining normal. Neurology has seen. Will require admit.   4:18 PM Patient more awake and making purposeful movements. Dr. Maryan Rued spoke with hospitalist for admit.     Margette Fast, MD 01/23/18 [phone]

## 2018-01-24 ENCOUNTER — Other Ambulatory Visit: Payer: Self-pay

## 2018-01-24 DIAGNOSIS — I5022 Chronic systolic (congestive) heart failure: Secondary | ICD-10-CM | POA: Diagnosis not present

## 2018-01-24 DIAGNOSIS — R569 Unspecified convulsions: Secondary | ICD-10-CM | POA: Diagnosis not present

## 2018-01-24 DIAGNOSIS — N183 Chronic kidney disease, stage 3 (moderate): Secondary | ICD-10-CM | POA: Diagnosis not present

## 2018-01-24 LAB — CBC
HEMATOCRIT: 29.8 % — AB (ref 39.0–52.0)
Hemoglobin: 10.1 g/dL — ABNORMAL LOW (ref 13.0–17.0)
MCH: 31.9 pg (ref 26.0–34.0)
MCHC: 33.9 g/dL (ref 30.0–36.0)
MCV: 94 fL (ref 80.0–100.0)
PLATELETS: 129 10*3/uL — AB (ref 150–400)
RBC: 3.17 MIL/uL — ABNORMAL LOW (ref 4.22–5.81)
RDW: 12 % (ref 11.5–15.5)
WBC: 9.2 10*3/uL (ref 4.0–10.5)
nRBC: 0 % (ref 0.0–0.2)

## 2018-01-24 LAB — BASIC METABOLIC PANEL
Anion gap: 3 — ABNORMAL LOW (ref 5–15)
BUN: 23 mg/dL (ref 8–23)
CHLORIDE: 113 mmol/L — AB (ref 98–111)
CO2: 21 mmol/L — ABNORMAL LOW (ref 22–32)
CREATININE: 1.26 mg/dL — AB (ref 0.61–1.24)
Calcium: 8.1 mg/dL — ABNORMAL LOW (ref 8.9–10.3)
GFR, EST AFRICAN AMERICAN: 60 mL/min — AB (ref >60)
GFR, EST NON AFRICAN AMERICAN: 51 mL/min — AB (ref >60)
Glucose, Bld: 75 mg/dL (ref 70–99)
POTASSIUM: 3.7 mmol/L (ref 3.5–5.1)
SODIUM: 137 mmol/L (ref 135–145)

## 2018-01-24 LAB — GLUCOSE, CAPILLARY
GLUCOSE-CAPILLARY: 128 mg/dL — AB (ref 70–99)
Glucose-Capillary: 66 mg/dL — ABNORMAL LOW (ref 70–99)
Glucose-Capillary: 70 mg/dL (ref 70–99)
Glucose-Capillary: 79 mg/dL (ref 70–99)

## 2018-01-24 LAB — URINALYSIS, ROUTINE W REFLEX MICROSCOPIC
BILIRUBIN URINE: NEGATIVE
GLUCOSE, UA: NEGATIVE mg/dL
HGB URINE DIPSTICK: NEGATIVE
Ketones, ur: NEGATIVE mg/dL
Nitrite: NEGATIVE
PROTEIN: 30 mg/dL — AB
Specific Gravity, Urine: 1.014 (ref 1.005–1.030)
pH: 6 (ref 5.0–8.0)

## 2018-01-24 MED ORDER — LEVETIRACETAM 500 MG PO TABS: 500.0000 mg | ORAL_TABLET | Freq: Two times a day (BID) | ORAL | 1 refills | Status: DC

## 2018-01-24 MED ORDER — LOSARTAN POTASSIUM 25 MG PO TABS: 25.0000 mg | ORAL_TABLET | Freq: Every day | ORAL | 11 refills | Status: DC

## 2018-01-24 NOTE — Progress Notes (Signed)
Notified Dyanne Carrel NP of patients 5 beat run of v-tach, he was asymptomatic. No new orders.

## 2018-01-24 NOTE — Progress Notes (Deleted)
Subjective: Patient is currently alert and oriented.  No further seizures overnight.  Talk to the wife she stated that he had been given a liquid form of Keppra and he did not want to take the liquid form is more than happy to take the pill form.  Exam: Vitals:   01/24/18 0850 01/24/18 0902  BP: (!) 104/49   Pulse: 76   Resp: 18   Temp:  99.3 F (37.4 C)  SpO2: 96%     Physical Exam   HEENT-  Normocephalic, no lesions, without obvious abnormality.  Normal external eye and conjunctiva.   Extremities- Warm, dry and intact Musculoskeletal-no joint tenderness, deformity or swelling Skin-warm and dry, no hyperpigmentation, vitiligo, or suspicious lesions    Neuro:  Mental Status: Alert, oriented, thought content appropriate.  Speech fluent without evidence of aphasia.  Able to follow 3 step commands without difficulty. Cranial Nerves: II:  Visual fields grossly normal,  III,IV, VI: ptosis not present, extra-ocular motions intact bilaterally pupils equal, round, reactive to light and accommodation V,VII: smile symmetric, facial light touch sensation normal bilaterally VIII: hearing normal bilaterally IX,X: uvula rises midline XI: bilateral shoulder shrug XII: midline tongue extension Motor: Right : Upper extremity   5/5    Left:     Upper extremity   5/5  Lower extremity   5/5     Lower extremity   5/5 Tone and bulk:normal tone throughout; no atrophy noted Sensory: Pinprick and light touch intact throughout, bilaterally Deep Tendon Reflexes: 2+ and symmetric throughout Plantars: Right: downgoing   Left: downgoing Cerebellar: normal finger-to-nose, normal rapid alternating movements and normal heel-to-shin test     Medications:  Prior to Admission:  Medications Prior to Admission  Medication Sig Dispense Refill Last Dose  . aspirin 81 MG tablet Take 81 mg by mouth daily.    01/22/2018 at Unknown time  . B Complex-C (SUPER B COMPLEX PO) Take 1 tablet by mouth daily.    01/22/2018 at Unknown time  . citalopram (CELEXA) 10 MG tablet Take 10 mg by mouth daily.    01/22/2018 at Unknown time  . finasteride (PROSCAR) 5 MG tablet Take 5 mg by mouth daily.    01/22/2018 at Unknown time  . hydrocortisone 2.5 % cream Apply 1 application topically as needed (face).   0 unknown at prn  . insulin aspart (NOVOLOG) 100 UNIT/ML injection Inject 0-9 Units into the skin 3 (three) times daily with meals. (Patient taking differently: Inject 10 Units into the skin 2 (two) times daily. 10 mg before breakfast and 10 mg at bedtime) 10 mL 11 01/22/2018 at Unknown time  . Insulin Degludec (TRESIBA) 100 UNIT/ML SOLN Inject 25 Units into the skin at bedtime.   01/22/2018 at Unknown time  . latanoprost (XALATAN) 0.005 % ophthalmic solution Place 1 drop into both eyes at bedtime.   01/22/2018 at Unknown time  . losartan (COZAAR) 50 MG tablet Take 50 mg by mouth daily.  5 01/22/2018 at Unknown time  . Multiple Vitamin (MULTIVITAMIN) tablet Take 1 tablet by mouth every evening.    01/22/2018 at Unknown time  . rosuvastatin (CRESTOR) 20 MG tablet Take 10 mg by mouth every evening.    01/22/2018 at Unknown time  . vitamin C (ASCORBIC ACID) 500 MG tablet Take 500 mg by mouth every evening.    01/22/2018 at Unknown time   Scheduled: . aspirin EC  81 mg Oral Daily  . citalopram  10 mg Oral Daily  . enoxaparin (LOVENOX) injection  40 mg Subcutaneous Q24H  . finasteride  5 mg Oral Daily  . insulin aspart  0-15 Units Subcutaneous TID WC  . insulin aspart  0-5 Units Subcutaneous QHS  . insulin glargine  10 Units Subcutaneous QHS  . levETIRAcetam  500 mg Oral BID  . rosuvastatin  10 mg Oral QPM    Pertinent Labs/Diagnostics: Creatinine 1.26 Calcium 8.1 White blood cell count 9.2  Etta Quill PA-C [facility] [phone]   Assessment: 84 year old male with history of seizures, noncompliant with Keppra which has caused breakthrough seizures.  Wife states his noncompliance was due to the  fact that he was discharged with liquid Keppra instead of pill form.  Thus would recommend discharging him with pill form of 500 mg twice daily.   Recommendations: -Discharge with 500 mg twice daily Keppra in pill form - At this point no further recommendations.    01/24/2018, 10:42 AM

## 2018-01-24 NOTE — Progress Notes (Signed)
PT Cancellation Note  Patient Details Name: Daniel [name] MRN: [id number] DOB: March 19, 1934   Cancelled Treatment:    Reason Eval/Treat Not Completed: Other (comment) attempted to work with patient however he was still actively eating breakfast, plan to attempt to return later in morning if time/schedule allow.    Deniece Ree PT, DPT, CBIS  Supplemental Physical Therapist [facility]    Pager [phone] Acute Rehab Office [phone]

## 2018-01-24 NOTE — Progress Notes (Signed)
Subjective: Patient is currently alert and oriented.  No further seizures overnight.  Talk to the wife she stated that he had been given a liquid form of Keppra and he did not want to take the liquid form is more than happy to take the pill form.  Exam: Vitals:   01/24/18 0850 01/24/18 0902  BP: (!) 104/49   Pulse: 76   Resp: 18   Temp:  99.3 F (37.4 C)  SpO2: 96%     Physical Exam   HEENT-  Normocephalic, no lesions, without obvious abnormality.  Normal external eye and conjunctiva.   Extremities- Warm, dry and intact Musculoskeletal-no joint tenderness, deformity or swelling Skin-warm and dry, no hyperpigmentation, vitiligo, or suspicious lesions    Neuro:  Mental Status: Alert, oriented, thought content appropriate.  Speech fluent without evidence of aphasia.  Able to follow 3 step commands without difficulty. Cranial Nerves: II:  Visual fields grossly normal,  III,IV, VI: ptosis not present, extra-ocular motions intact bilaterally pupils equal, round, reactive to light and accommodation V,VII: smile symmetric, facial light touch sensation normal bilaterally VIII: hearing normal bilaterally IX,X: uvula rises midline XI: bilateral shoulder shrug XII: midline tongue extension Motor: Right : Upper extremity   5/5    Left:     Upper extremity   5/5  Lower extremity   5/5     Lower extremity   5/5 Tone and bulk:normal tone throughout; no atrophy noted Sensory: Pinprick and light touch intact throughout, bilaterally Deep Tendon Reflexes: 2+ and symmetric throughout Plantars: Right: downgoing   Left: downgoing Cerebellar: normal finger-to-nose, normal rapid alternating movements and normal heel-to-shin test     Medications:  Prior to Admission:  Medications Prior to Admission  Medication Sig Dispense Refill Last Dose  . aspirin 81 MG tablet Take 81 mg by mouth daily.    01/22/2018 at Unknown time  . B Complex-C (SUPER B COMPLEX PO) Take 1 tablet by mouth daily.    01/22/2018 at Unknown time  . citalopram (CELEXA) 10 MG tablet Take 10 mg by mouth daily.    01/22/2018 at Unknown time  . finasteride (PROSCAR) 5 MG tablet Take 5 mg by mouth daily.    01/22/2018 at Unknown time  . hydrocortisone 2.5 % cream Apply 1 application topically as needed (face).   0 unknown at prn  . insulin aspart (NOVOLOG) 100 UNIT/ML injection Inject 0-9 Units into the skin 3 (three) times daily with meals. (Patient taking differently: Inject 10 Units into the skin 2 (two) times daily. 10 mg before breakfast and 10 mg at bedtime) 10 mL 11 01/22/2018 at Unknown time  . Insulin Degludec (TRESIBA) 100 UNIT/ML SOLN Inject 25 Units into the skin at bedtime.   01/22/2018 at Unknown time  . latanoprost (XALATAN) 0.005 % ophthalmic solution Place 1 drop into both eyes at bedtime.   01/22/2018 at Unknown time  . losartan (COZAAR) 50 MG tablet Take 50 mg by mouth daily.  5 01/22/2018 at Unknown time  . Multiple Vitamin (MULTIVITAMIN) tablet Take 1 tablet by mouth every evening.    01/22/2018 at Unknown time  . rosuvastatin (CRESTOR) 20 MG tablet Take 10 mg by mouth every evening.    01/22/2018 at Unknown time  . vitamin C (ASCORBIC ACID) 500 MG tablet Take 500 mg by mouth every evening.    01/22/2018 at Unknown time   Scheduled: . aspirin EC  81 mg Oral Daily  . citalopram  10 mg Oral Daily  . enoxaparin (LOVENOX) injection  40 mg Subcutaneous Q24H  . finasteride  5 mg Oral Daily  . insulin aspart  0-15 Units Subcutaneous TID WC  . insulin aspart  0-5 Units Subcutaneous QHS  . insulin glargine  10 Units Subcutaneous QHS  . levETIRAcetam  500 mg Oral BID  . rosuvastatin  10 mg Oral QPM    Pertinent Labs/Diagnostics: Creatinine 1.26 Calcium 8.1 White blood cell count 9.2     Assessment: 84 year old male with history of seizures, noncompliant with Keppra which has caused breakthrough seizures.  Wife states his noncompliance was due to the fact that he was discharged with liquid Keppra instead  of pill form.  Thus would recommend discharging him with pill form of 500 mg twice daily.   Recommendations: -Discharge with 500 mg twice daily Keppra in pill form - At this point no further recommendations.  Etta Quill PA-C [facility] [phone]  01/24/2018, 11:31 AM

## 2018-01-24 NOTE — Discharge Instructions (Signed)
Epilepsy Epilepsy is when a person keeps having seizures. A seizure is unusual activity in the brain. A seizure can change how you think or behave, and it can make it hard to be aware of what is happening. This condition can cause problems, such as:  Falls, accidents, and injury.  Depression.  Poor memory.  Sudden unexplained death in epilepsy (SUDEP). This is rare. Its cause is not known.  Most people with epilepsy lead normal lives. Follow these instructions at home: Medicines   Take medicines only as told by your doctor.  Avoid anything that may keep your medicine from working, such as alcohol. Activity  Get enough rest. Lack of sleep can make seizures more likely to occur.  Follow your doctors advice about driving, swimming, and doing anything else that would be dangerous if you had a seizure. Teaching others Teach friends and family what to do if you have a seizure. They should:  Lay you on the ground to prevent a fall.  Cushion your head and body.  Loosen any tight clothing around your neck.  Turn you on your side.  Stay with you until you are better.  Not hold you down.  Not put anything in your mouth.  Know whether or not you need emergency care.  General instructions  Avoid anything that causes you to have seizures.  Keep a seizure diary. Write down what you remember about each seizure, and especially what might have caused it.  Keep all follow-up visits as told by your doctor. This is important. Contact a doctor if:  You have a change in your seizure pattern.  You get an infection or start to feel sick. You may have more seizures when you are sick. Get help right away if:  A seizure does not stop after 5 minutes.  You have more than one seizure in a row, and you do not have enough time between the seizures to feel better.  A seizure makes it harder to breathe.  A seizure is different from other seizures you have had.  A seizure makes you  unable to speak or use a part of your body.  You did not wake up right after a seizure. This information is not intended to replace advice given to you by your health care provider. Make sure you discuss any questions you have with your health care provider. Document Released: 01/04/2009 Document Revised: 10/14/2015 Document Reviewed: 09/17/2015 Elsevier Interactive Patient Education  [employer].

## 2018-01-24 NOTE — Care Management Note (Signed)
Case Management Note  Patient Details  Name: Daniel [name] MRN: [id number] Date of Birth: 03-Jun-1933  Subjective/Objective:   Pt in with seizures. He is from home with his spouse.  DME: walker, wheelchair, shower seat, stair lift.  Pt denies any issues with obtaining his medications or with transportation.                 Action/Plan: Pt discharging home with his spouse. She is able to provide supervision at home. PT recommending SNF but patient refusing.  CM met with the patient and his wife and provided choice of [place] agencies. [facility] selected. Malachy Mood with [facility] notified and accepted the referral.  Wife to provide transportation home.   Expected Discharge Date:  01/24/18               Expected Discharge Plan:  [place]  In-House Referral:     Discharge planning Services  CM Consult  Post Acute Care Choice:  Home Health Choice offered to:  Patient, Spouse  DME Arranged:    DME Agency:     HH Arranged:  PT, OT HH Agency:  [place]  Status of Service:  Completed, signed off  If discussed at [place] of Stay Meetings, dates discussed:    Additional Comments:  Pollie Friar, RN 01/24/2018, 2:22 PM

## 2018-01-24 NOTE — Discharge Summary (Signed)
Physician Discharge Summary  Daniel [name] URK:[id number] DOB: 1933/08/02 DOA: 01/23/2018  PCP: Jani Gravel, MD  Admit date: 01/23/2018 Discharge date: 01/24/2018  Time spent: 40 minutes  Recommendations for Outpatient Follow-up:  1. Follow up with PCP 1-2 week for post hospitalization evaluation and monitoring of BP as home cozaar dose decreased from 50mg  to 25mg   2. Recommend referral to neurology (patient not inclined) 3. No driving or swimming   Discharge Diagnoses:  Principal Problem:   Seizure ([place]) Active Problems:   DM2 (diabetes mellitus, type 2) (HCC)   Chronic systolic CHF (congestive heart failure) (HCC)   Type II diabetes mellitus with neurological manifestations (HCC)   Hypotension   CKD (chronic kidney disease), stage III ([place])   Discharge Condition: stable  Diet recommendation: heart healthy carb modified  Filed Weights   01/23/18 1435 01/23/18 2015  Weight: 82.4 kg 83.5 kg    History of present illness:  Daniel [name] is a 84 y.o. male with hx seizures, IDDM, HTN, prostate cancer, CAD s/p PCI 2004 and sCHF who presented with seizure.  All history collected from wife, as patient was sedated and too sluggish to answer questions.  The patient was in his usual state of health until day of admission when he was watching the football game, his wife noticed he was unable to work the remote.  She asked him a question and he was "just staring past her into space".  She wanted to check his blood pressure, but he couldn't put out his arm, so she called 9-1-1 who began to bring the patient in as [place] when he had a generalized tonic-clonic seizure en route, received Versed which aborts the seizure.  Hospital Course:  Seizure:  Due to epilepsy and non-adherence to medication. No metabolic derangement no s/sx infection.  Patient has no dementia, but wife states he is very stubborn and refuses to take [place] or follow up with Neurology. Had not taken any keppra  since 05/2017.  Hopefully, this repeat event will make manifest the natural history of his illness likely if he does not use AEDs. Evaluated by neuro who recommend keppra in pill form and OP follow up.  Diabetes:  -Continue Tresiba -A1c April 6.0  CAD Chronic systolic CHF Hypertension Hypotension:  Hypontension from Versed initially but resolved in ER without intervention.  Hx of CAD and EF 40%, most recently EF normalized. BP low end of normal during hospitalization and without cozaar. Will discharge with lower dose cozaar. Recommend f/u with PCP 1 week for evaluation of BP  CKD III - IV:  Baseline Cr 1.4-1.7, at baseline. -Hold losartan due to hypotension  Elevated anion gap:  Likely from lactic acidosis from seizure.  Glucose normal. -IVF overnight and repeat BMP  Leukocytosis:  From seizure, doubt infection  Other medication Procedures:    Consultations:  [place] neurology  Discharge Exam: Vitals:   01/24/18 0902 01/24/18 1224  BP:  110/61  Pulse:  73  Resp:  19  Temp: 99.3 F (37.4 C) 99 F (37.2 C)  SpO2:  97%    General: well nourished in no acute distres Cardiovascular: rrr no mgr no LE edema Respiratory: normal effort BS clear bilaterally no wheeze Neuro: alert oriented x3. Speech clear facial symmetry.   Discharge Instructions   Discharge Instructions    Diet - low sodium heart healthy   Complete by:  As directed    Discharge instructions   Complete by:  As directed    Follow up  with PCP 1-2 weeks for post hospital check up Recommend referral to neurology for evaluation of seizure Take medications as instructed   Increase activity slowly   Complete by:  As directed      Allergies as of 01/24/2018   No Known Allergies     Medication List    TAKE these medications   aspirin 81 MG tablet Take 81 mg by mouth daily.   citalopram 10 MG tablet Commonly known as:  CELEXA Take 10 mg by mouth daily.   finasteride 5 MG  tablet Commonly known as:  PROSCAR Take 5 mg by mouth daily.   hydrocortisone 2.5 % cream Apply 1 application topically as needed (face).   insulin aspart 100 UNIT/ML injection Commonly known as:  novoLOG Inject 0-9 Units into the skin 3 (three) times daily with meals. What changed:    how much to take  when to take this  additional instructions   latanoprost 0.005 % ophthalmic solution Commonly known as:  XALATAN Place 1 drop into both eyes at bedtime.   levETIRAcetam 500 MG tablet Commonly known as:  KEPPRA Take 1 tablet (500 mg total) by mouth 2 (two) times daily.   losartan 25 MG tablet Commonly known as:  COZAAR Take 1 tablet (25 mg total) by mouth daily. What changed:    medication strength  how much to take   multivitamin tablet Take 1 tablet by mouth every evening.   rosuvastatin 20 MG tablet Commonly known as:  CRESTOR Take 10 mg by mouth every evening.   SUPER B COMPLEX PO Take 1 tablet by mouth daily.   TRESIBA 100 UNIT/ML Soln Generic drug:  Insulin Degludec Inject 25 Units into the skin at bedtime.   vitamin C 500 MG tablet Commonly known as:  ASCORBIC ACID Take 500 mg by mouth every evening.      No Known Allergies Follow-up Information    [facility] Follow up.   Why:  They will contact you for the first visit. Contact information: [phone]           The results of significant diagnostics from this hospitalization (including imaging, microbiology, ancillary and laboratory) are listed below for reference.    Significant Diagnostic Studies: Dg Chest Port 1 View  Result Date: 01/23/2018 CLINICAL DATA:  Altered mental status EXAM: PORTABLE CHEST 1 VIEW COMPARISON:  07/19/2017 FINDINGS: Cardiac shadow is at the upper limits of normal in size. Aortic calcifications are again noted. The lungs are hypoinflated although no focal infiltrate or sizable effusion is seen. IMPRESSION: Poor inspiratory effort without acute  abnormality. Electronically Signed   By: Inez Catalina M.D.   On: 01/23/2018 15:02   Ct Head Code Stroke Wo Contrast  Result Date: 01/23/2018 CLINICAL DATA:  Code stroke. Right-sided weakness. Right-sided gaze. EXAM: CT HEAD WITHOUT CONTRAST TECHNIQUE: Contiguous axial images were obtained from the base of the skull through the vertex without intravenous contrast. COMPARISON:  CT head without contrast 07/19/2017. FINDINGS: Brain: Advanced atrophy and white matter disease is again seen. No acute cortical infarct or hemorrhage is present. Diffuse white matter disease is stable. Basal ganglia are intact. Insular ribbon is normal. No significant extra-axial fluid collection is present. The brainstem and cerebellum are within limits. Vascular: Atherosclerotic calcifications are present within the cavernous internal carotid arteries bilaterally. There is no hyperdense vessel. Skull: Calvarium is intact. No focal lytic blastic lesions are present. Acute or healing fractures are present. Sinuses/Orbits: Chronic wall thickening is present about the right maxillary  sinus. There is mild mucosal thickening is well. Nasal intubation is noted. Chronic left sphenoid mucosal disease is present as well. The mastoid air cells are clear. ASPECTS [facility] Stroke Program Early CT Score) - Ganglionic level infarction (caudate, lentiform nuclei, internal capsule, insula, M1-M3 cortex): 7/7 - Supraganglionic infarction (M4-M6 cortex): 3/3 Total score (0-10 with 10 being normal): 10/10 IMPRESSION: 1. Advanced atrophy and white matter disease without acute cortical abnormality. 2. Atherosclerosis 3. Nasal intubation 4. ASPECTS is 10/10 The above was relayed via text pager to Dr. Leonel Ramsay on 01/23/2018 at 14:39 . Electronically Signed   By: San Morelle M.D.   On: 01/23/2018 14:40    Microbiology: No results found for this or any previous visit (from the past 240 hour(s)).   Labs: Basic Metabolic Panel: Recent Labs  Lab  01/23/18 1423 01/23/18 1430 01/24/18 0604  NA 135 137 137  K 3.9 4.0 3.7  CL 104 105 113*  CO2 14*  --  21*  GLUCOSE 129* 118* 75  BUN 29* 31* 23  CREATININE 1.66* 1.40* 1.26*  CALCIUM 8.9  --  8.1*   Liver Function Tests: Recent Labs  Lab 01/23/18 1423  AST 38  ALT 23  ALKPHOS 55  BILITOT 0.8  PROT 6.5  ALBUMIN 3.4*   No results for input(s): LIPASE, AMYLASE in the last 168 hours. No results for input(s): AMMONIA in the last 168 hours. CBC: Recent Labs  Lab 01/23/18 1423 01/23/18 1430 01/24/18 0604  WBC 15.6*  --  9.2  NEUTROABS 6.3  --   --   HGB 12.5* 12.6* 10.1*  HCT 39.0 37.0* 29.8*  MCV 99.7  --  94.0  PLT 177  --  129*   Cardiac Enzymes: No results for input(s): CKTOTAL, CKMB, CKMBINDEX, TROPONINI in the last 168 hours. BNP: BNP (last 3 results) No results for input(s): BNP in the last 8760 hours.  ProBNP (last 3 results) No results for input(s): PROBNP in the last 8760 hours.  CBG: Recent Labs  Lab 01/23/18 2145 01/24/18 0633 01/24/18 0651 01/24/18 0708 01/24/18 1139  GLUCAP 109* 66* 70 79 128*       Signed:  Radene Gunning NP  [facility] 01/24/2018, 2:25 PM

## 2018-01-24 NOTE — Evaluation (Signed)
Physical Therapy Evaluation Patient Details Name: Daniel [name] MRN: [id number] DOB: 23-Nov-1933 Today's Date: 01/24/2018   History of Present Illness  84yo male presenting with tonic-clonic seizures, history of recent seizures in March 2019 and who has refused to take [place] or f/u with neurologist. PMH alcohol abuse, prostate CA, CHF, DM, HTN, peripheral neuropathy, seizures   Clinical Impression   Patient received in bed, very pleasant and willing to participate in PT, A&Ox3 this morning and reoriented as to reason why he is currently in the hospital, he does not appear to have memory of seizures during session this morning. Able to perform bed mobility with S and extended time/increased effort, but requires ModA for safe functional transfers due to weakness, poor safety awareness, and heavy posterior bias with initial mobility. ModA provided to gain balance prior to gait with RW. Able to ambulate approximately 57f in room with MinA/Mod cues for safe proximity to RW and balance, poor functional safety awareness noted during all mobility today. He was left in bed with all needs met, bed alarm active, and family present. From PT standpoint, strongly recommend ST-SNF to further address functional deficits and reduce fall risk however his wife reports that he will likely refuse this; if he does decline ST-SNF, he will then require HHPT, 24/7 assistance, and possible home aide to reduce caregiver burden at home.     Follow Up Recommendations SNF;Other (comment);Supervision/Assistance - 24 hour(if he refuses SNF, he will require HHPT, 24/7 assist, and possible home aide)    Equipment Recommendations  3in1 (PT)    Recommendations for Other Services       Precautions / Restrictions Precautions Precautions: Fall Restrictions Weight Bearing Restrictions: No      Mobility  Bed Mobility Overal bed mobility: Needs Assistance Bed Mobility: Supine to Sit;Sit to Supine     Supine to sit:  Supervision Sit to supine: Supervision   General bed mobility comments: S, extended time and increased effort   Transfers Overall transfer level: Needs assistance Equipment used: Rolling walker (2 wheeled) Transfers: Sit to/from Stand Sit to Stand: Mod assist         General transfer comment: ModA to come to full standing position, heavy posterior unsteadiness in initial standing requiring ModA to gain balance   Ambulation/Gait Ambulation/Gait assistance: Min assist Gait Distance (Feet): 30 Feet Assistive device: Rolling walker (2 wheeled) Gait Pattern/deviations: Step-through pattern;Decreased step length - right;Decreased step length - left;Decreased stride length;Drifts right/left;Trunk flexed;Narrow base of support Gait velocity: decreased    General Gait Details: unsafe gait pattern with RW, MinA and cues to maintain balance and safe proximity to [employer]   Modified Rankin (Stroke Patients Only)       Balance Overall balance assessment: Needs assistance;History of Falls Sitting-balance support: Bilateral upper extremity supported;Feet supported Sitting balance-Leahy Scale: Good     Standing balance support: Bilateral upper extremity supported;During functional activity Standing balance-Leahy Scale: Poor Standing balance comment: reliance on UE support, unsafe use of RW                              Pertinent Vitals/Pain Pain Assessment: No/denies pain    Home Living Family/patient expects to be discharged to:: Private residence Living Arrangements: Spouse/significant other Available Help at Discharge: Family;Available 24 hours/day Type of Home: House Home Access: Stairs to enter   [employer] Steps:  1 Home Layout: Two level Home Equipment: Clinical cytogeneticist - 2 wheels;Wheelchair - manual      Prior Function Level of Independence: Needs assistance   Gait / Transfers Assistance Needed:  USed RW inside home and wheelchair ina and outside  ADL's / Homemaking Assistance Needed: wife assists pt in shower        Hand Dominance        Extremity/Trunk Assessment   Upper Extremity Assessment Upper Extremity Assessment: Generalized weakness    Lower Extremity Assessment Lower Extremity Assessment: Generalized weakness    Cervical / Trunk Assessment Cervical / Trunk Assessment: Kyphotic  Communication   Communication: No difficulties  Cognition Arousal/Alertness: Awake/alert Behavior During Therapy: WFL for tasks assessed/performed Overall Cognitive Status: Within Functional Limits for tasks assessed                                        General Comments      Exercises     Assessment/Plan    PT Assessment Patient needs continued PT services  PT Problem List Decreased strength;Decreased mobility;Decreased safety awareness;Decreased coordination;Decreased activity tolerance;Decreased balance;Decreased knowledge of use of DME       PT Treatment Interventions DME instruction;Therapeutic activities;Gait training;Therapeutic exercise;Patient/family education;Stair training;Balance training;Functional mobility training;Neuromuscular re-education    PT Goals (Current goals can be found in the Care Plan section)  Acute Rehab PT Goals Patient Stated Goal: to go home  PT Goal Formulation: With patient/family Time For Goal Achievement: 02/07/18 Potential to Achieve Goals: Fair    Frequency Min 3X/week   Barriers to discharge        Co-evaluation               AM-PAC PT "6 Clicks" Daily Activity  Outcome Measure Difficulty turning over in bed (including adjusting bedclothes, sheets and blankets)?: None Difficulty moving from lying on back to sitting on the side of the bed? : A Little Difficulty sitting down on and standing up from a chair with arms (e.g., wheelchair, bedside commode, etc,.)?: A Lot Help needed moving to and from a  bed to chair (including a wheelchair)?: A Little Help needed walking in hospital room?: A Little Help needed climbing 3-5 steps with a railing? : A Lot 6 Click Score: 17    End of Session Equipment Utilized During Treatment: Gait belt Activity Tolerance: Patient tolerated treatment well Patient left: in bed;with bed alarm set;with family/visitor present;with call bell/phone within reach   PT Visit Diagnosis: Unsteadiness on feet (R26.81);History of falling (Z91.81);Muscle weakness (generalized) (M62.81);Difficulty in walking, not elsewhere classified (R26.2)    Time: 9249-3241 PT Time Calculation (min) (ACUTE ONLY): 20 min   Charges:   PT Evaluation $PT Eval Moderate Complexity: 1 Mod          Deniece Ree PT, DPT, CBIS  Supplemental Physical Therapist [place]    Pager [phone] Acute Rehab Office [phone]

## 2018-01-24 NOTE — Progress Notes (Signed)
Patient discharged in stable condition with all belongings wife at bedside. They both verbalized understanding of all discharge instructions and importance of follow up visits.

## 2018-01-27 DIAGNOSIS — N183 Chronic kidney disease, stage 3 (moderate): Secondary | ICD-10-CM | POA: Diagnosis not present

## 2018-01-27 DIAGNOSIS — G40909 Epilepsy, unspecified, not intractable, without status epilepticus: Secondary | ICD-10-CM | POA: Diagnosis not present

## 2018-01-27 DIAGNOSIS — E7800 Pure hypercholesterolemia, unspecified: Secondary | ICD-10-CM | POA: Diagnosis not present

## 2018-01-27 DIAGNOSIS — I2510 Atherosclerotic heart disease of native coronary artery without angina pectoris: Secondary | ICD-10-CM | POA: Diagnosis not present

## 2018-01-27 DIAGNOSIS — E118 Type 2 diabetes mellitus with unspecified complications: Secondary | ICD-10-CM | POA: Diagnosis not present

## 2018-01-27 DIAGNOSIS — Z09 Encounter for follow-up examination after completed treatment for conditions other than malignant neoplasm: Secondary | ICD-10-CM | POA: Diagnosis not present

## 2018-02-07 DIAGNOSIS — L400 Psoriasis vulgaris: Secondary | ICD-10-CM | POA: Diagnosis not present

## 2018-02-07 DIAGNOSIS — Z85828 Personal history of other malignant neoplasm of skin: Secondary | ICD-10-CM | POA: Diagnosis not present

## 2018-02-07 DIAGNOSIS — L218 Other seborrheic dermatitis: Secondary | ICD-10-CM | POA: Diagnosis not present

## 2018-02-07 DIAGNOSIS — L570 Actinic keratosis: Secondary | ICD-10-CM | POA: Diagnosis not present

## 2018-02-24 DIAGNOSIS — E1165 Type 2 diabetes mellitus with hyperglycemia: Secondary | ICD-10-CM | POA: Diagnosis not present

## 2018-02-24 DIAGNOSIS — N390 Urinary tract infection, site not specified: Secondary | ICD-10-CM | POA: Diagnosis not present

## 2018-02-24 DIAGNOSIS — I10 Essential (primary) hypertension: Secondary | ICD-10-CM | POA: Diagnosis not present

## 2018-02-24 DIAGNOSIS — Z125 Encounter for screening for malignant neoplasm of prostate: Secondary | ICD-10-CM | POA: Diagnosis not present

## 2018-03-03 DIAGNOSIS — E7800 Pure hypercholesterolemia, unspecified: Secondary | ICD-10-CM | POA: Diagnosis not present

## 2018-03-03 DIAGNOSIS — E1165 Type 2 diabetes mellitus with hyperglycemia: Secondary | ICD-10-CM | POA: Diagnosis not present

## 2018-03-03 DIAGNOSIS — R69 Illness, unspecified: Secondary | ICD-10-CM | POA: Diagnosis not present

## 2018-03-03 DIAGNOSIS — I2510 Atherosclerotic heart disease of native coronary artery without angina pectoris: Secondary | ICD-10-CM | POA: Diagnosis not present

## 2018-03-03 DIAGNOSIS — R809 Proteinuria, unspecified: Secondary | ICD-10-CM | POA: Diagnosis not present

## 2018-03-03 DIAGNOSIS — G40909 Epilepsy, unspecified, not intractable, without status epilepticus: Secondary | ICD-10-CM | POA: Diagnosis not present

## 2018-03-03 DIAGNOSIS — E118 Type 2 diabetes mellitus with unspecified complications: Secondary | ICD-10-CM | POA: Diagnosis not present

## 2018-03-03 DIAGNOSIS — N183 Chronic kidney disease, stage 3 (moderate): Secondary | ICD-10-CM | POA: Diagnosis not present

## 2018-03-03 DIAGNOSIS — Z794 Long term (current) use of insulin: Secondary | ICD-10-CM | POA: Diagnosis not present

## 2018-03-03 DIAGNOSIS — I10 Essential (primary) hypertension: Secondary | ICD-10-CM | POA: Diagnosis not present

## 2018-03-08 ENCOUNTER — Encounter: Payer: Self-pay | Admitting: Podiatry

## 2018-03-08 ENCOUNTER — Ambulatory Visit: Payer: Medicare HMO | Admitting: Podiatry

## 2018-03-08 DIAGNOSIS — Q828 Other specified congenital malformations of skin: Secondary | ICD-10-CM

## 2018-03-08 DIAGNOSIS — B351 Tinea unguium: Secondary | ICD-10-CM | POA: Diagnosis not present

## 2018-03-08 DIAGNOSIS — M79674 Pain in right toe(s): Secondary | ICD-10-CM | POA: Diagnosis not present

## 2018-03-08 DIAGNOSIS — M79675 Pain in left toe(s): Secondary | ICD-10-CM

## 2018-03-08 DIAGNOSIS — E1149 Type 2 diabetes mellitus with other diabetic neurological complication: Secondary | ICD-10-CM | POA: Diagnosis not present

## 2018-03-08 NOTE — Progress Notes (Signed)
Patient ID: Daniel [name], male   DOB: 1933/04/02, 84 y.o.   MRN: [id number]  Subjective: Daniel [name] is a 83 year old male who presents today with his wife for recurrent painful, elongated, thickened toenails which he cannot trim himself and for calluses to both of his big toes. Denies any redness or drainage around the areas.Denies any systemic complaints such as fevers, chills, nausea, vomiting.   Objective: AAO 3, NAD DP/PT pulses palpable, CRT less than 3 seconds Protective sensation decreased with Simms Weinstein monofilament Nails hypertrophic, dystrophic, elongated, brittle, discolored 10. There is tenderness overlying the nails 1-5 bilaterally. There is no surrounding erythema or drainage along the nail sites. Hyperkerotic lesions bilateral sub-hallux although more minimal today. Upon debridement of the lesions, no underlying ulceration, drainage, or other signs of infection.  No pain with calf compression, swelling, warmth, erythema.  Assessment: Patient presents with symptomatic onychomycosis; hyperkerotic lesions  Plan: -Treatment options including alternatives, risks, complications were discussed -Nails sharply debrided 10 without complication/bleeding. -Hyperkerotic lesions sharply debrided x 2 without complications/bleeding.  Continue offloading and new pads were given today.  -Follow-up in 3 months for routine care or sooner if any problems are to arise. In the meantime, encouraged to call the office with any questions, concerns, changes symptoms.  Celesta Gentile, DPM

## 2018-04-05 DIAGNOSIS — L8942 Pressure ulcer of contiguous site of back, buttock and hip, stage 2: Secondary | ICD-10-CM | POA: Diagnosis not present

## 2018-04-05 DIAGNOSIS — E1165 Type 2 diabetes mellitus with hyperglycemia: Secondary | ICD-10-CM | POA: Diagnosis not present

## 2018-04-05 DIAGNOSIS — I10 Essential (primary) hypertension: Secondary | ICD-10-CM | POA: Diagnosis not present

## 2018-04-18 DIAGNOSIS — I10 Essential (primary) hypertension: Secondary | ICD-10-CM | POA: Diagnosis not present

## 2018-04-18 DIAGNOSIS — E1165 Type 2 diabetes mellitus with hyperglycemia: Secondary | ICD-10-CM | POA: Diagnosis not present

## 2018-04-18 DIAGNOSIS — L8942 Pressure ulcer of contiguous site of back, buttock and hip, stage 2: Secondary | ICD-10-CM | POA: Diagnosis not present

## 2018-04-19 DIAGNOSIS — E118 Type 2 diabetes mellitus with unspecified complications: Secondary | ICD-10-CM | POA: Diagnosis not present

## 2018-04-19 DIAGNOSIS — I2510 Atherosclerotic heart disease of native coronary artery without angina pectoris: Secondary | ICD-10-CM | POA: Diagnosis not present

## 2018-04-19 DIAGNOSIS — E7800 Pure hypercholesterolemia, unspecified: Secondary | ICD-10-CM | POA: Diagnosis not present

## 2018-04-19 DIAGNOSIS — N183 Chronic kidney disease, stage 3 (moderate): Secondary | ICD-10-CM | POA: Diagnosis not present

## 2018-04-26 DIAGNOSIS — E7800 Pure hypercholesterolemia, unspecified: Secondary | ICD-10-CM | POA: Diagnosis not present

## 2018-04-26 DIAGNOSIS — E1140 Type 2 diabetes mellitus with diabetic neuropathy, unspecified: Secondary | ICD-10-CM | POA: Diagnosis not present

## 2018-04-26 DIAGNOSIS — F329 Major depressive disorder, single episode, unspecified: Secondary | ICD-10-CM | POA: Diagnosis not present

## 2018-04-26 DIAGNOSIS — Z794 Long term (current) use of insulin: Secondary | ICD-10-CM | POA: Diagnosis not present

## 2018-04-26 DIAGNOSIS — N393 Stress incontinence (female) (male): Secondary | ICD-10-CM | POA: Diagnosis not present

## 2018-04-26 DIAGNOSIS — I130 Hypertensive heart and chronic kidney disease with heart failure and stage 1 through stage 4 chronic kidney disease, or unspecified chronic kidney disease: Secondary | ICD-10-CM | POA: Diagnosis not present

## 2018-04-26 DIAGNOSIS — G40909 Epilepsy, unspecified, not intractable, without status epilepticus: Secondary | ICD-10-CM | POA: Diagnosis not present

## 2018-04-26 DIAGNOSIS — N183 Chronic kidney disease, stage 3 (moderate): Secondary | ICD-10-CM | POA: Diagnosis not present

## 2018-04-26 DIAGNOSIS — E1122 Type 2 diabetes mellitus with diabetic chronic kidney disease: Secondary | ICD-10-CM | POA: Diagnosis not present

## 2018-04-26 DIAGNOSIS — I2510 Atherosclerotic heart disease of native coronary artery without angina pectoris: Secondary | ICD-10-CM | POA: Diagnosis not present

## 2018-04-26 DIAGNOSIS — F1021 Alcohol dependence, in remission: Secondary | ICD-10-CM | POA: Diagnosis not present

## 2018-04-26 DIAGNOSIS — L89322 Pressure ulcer of left buttock, stage 2: Secondary | ICD-10-CM | POA: Diagnosis not present

## 2018-04-26 DIAGNOSIS — I509 Heart failure, unspecified: Secondary | ICD-10-CM | POA: Diagnosis not present

## 2018-05-06 DIAGNOSIS — I130 Hypertensive heart and chronic kidney disease with heart failure and stage 1 through stage 4 chronic kidney disease, or unspecified chronic kidney disease: Secondary | ICD-10-CM | POA: Diagnosis not present

## 2018-05-06 DIAGNOSIS — E1122 Type 2 diabetes mellitus with diabetic chronic kidney disease: Secondary | ICD-10-CM | POA: Diagnosis not present

## 2018-05-06 DIAGNOSIS — L89322 Pressure ulcer of left buttock, stage 2: Secondary | ICD-10-CM | POA: Diagnosis not present

## 2018-05-06 DIAGNOSIS — I509 Heart failure, unspecified: Secondary | ICD-10-CM | POA: Diagnosis not present

## 2018-05-24 DIAGNOSIS — N183 Chronic kidney disease, stage 3 (moderate): Secondary | ICD-10-CM | POA: Diagnosis not present

## 2018-05-24 DIAGNOSIS — E118 Type 2 diabetes mellitus with unspecified complications: Secondary | ICD-10-CM | POA: Diagnosis not present

## 2018-05-24 DIAGNOSIS — I2510 Atherosclerotic heart disease of native coronary artery without angina pectoris: Secondary | ICD-10-CM | POA: Diagnosis not present

## 2018-05-24 DIAGNOSIS — E7800 Pure hypercholesterolemia, unspecified: Secondary | ICD-10-CM | POA: Diagnosis not present

## 2018-05-30 DIAGNOSIS — G40909 Epilepsy, unspecified, not intractable, without status epilepticus: Secondary | ICD-10-CM | POA: Diagnosis not present

## 2018-05-30 DIAGNOSIS — I509 Heart failure, unspecified: Secondary | ICD-10-CM | POA: Diagnosis not present

## 2018-05-30 DIAGNOSIS — L89322 Pressure ulcer of left buttock, stage 2: Secondary | ICD-10-CM | POA: Diagnosis not present

## 2018-05-30 DIAGNOSIS — I130 Hypertensive heart and chronic kidney disease with heart failure and stage 1 through stage 4 chronic kidney disease, or unspecified chronic kidney disease: Secondary | ICD-10-CM | POA: Diagnosis not present

## 2018-05-30 DIAGNOSIS — I2510 Atherosclerotic heart disease of native coronary artery without angina pectoris: Secondary | ICD-10-CM | POA: Diagnosis not present

## 2018-05-30 DIAGNOSIS — E1122 Type 2 diabetes mellitus with diabetic chronic kidney disease: Secondary | ICD-10-CM | POA: Diagnosis not present

## 2018-05-30 DIAGNOSIS — F329 Major depressive disorder, single episode, unspecified: Secondary | ICD-10-CM | POA: Diagnosis not present

## 2018-05-30 DIAGNOSIS — N183 Chronic kidney disease, stage 3 (moderate): Secondary | ICD-10-CM | POA: Diagnosis not present

## 2018-05-30 DIAGNOSIS — E7800 Pure hypercholesterolemia, unspecified: Secondary | ICD-10-CM | POA: Diagnosis not present

## 2018-05-30 DIAGNOSIS — E1140 Type 2 diabetes mellitus with diabetic neuropathy, unspecified: Secondary | ICD-10-CM | POA: Diagnosis not present

## 2018-06-07 DIAGNOSIS — I10 Essential (primary) hypertension: Secondary | ICD-10-CM | POA: Diagnosis not present

## 2018-06-07 DIAGNOSIS — E785 Hyperlipidemia, unspecified: Secondary | ICD-10-CM | POA: Diagnosis not present

## 2018-06-07 DIAGNOSIS — E1165 Type 2 diabetes mellitus with hyperglycemia: Secondary | ICD-10-CM | POA: Diagnosis not present

## 2018-06-14 ENCOUNTER — Other Ambulatory Visit: Payer: Self-pay

## 2018-06-14 ENCOUNTER — Ambulatory Visit: Payer: Medicare Other | Admitting: Podiatry

## 2018-06-14 ENCOUNTER — Ambulatory Visit: Payer: Medicare HMO | Admitting: Podiatry

## 2018-06-14 DIAGNOSIS — E1149 Type 2 diabetes mellitus with other diabetic neurological complication: Secondary | ICD-10-CM

## 2018-06-14 DIAGNOSIS — Q828 Other specified congenital malformations of skin: Secondary | ICD-10-CM | POA: Diagnosis not present

## 2018-06-14 DIAGNOSIS — B351 Tinea unguium: Secondary | ICD-10-CM | POA: Diagnosis not present

## 2018-06-14 DIAGNOSIS — M79674 Pain in right toe(s): Secondary | ICD-10-CM

## 2018-06-14 DIAGNOSIS — M79675 Pain in left toe(s): Secondary | ICD-10-CM

## 2018-06-20 NOTE — Progress Notes (Signed)
Patient ID: Daniel [name], male   DOB: 1933-04-30, 85 y.o.   MRN: [id number]  Subjective: Daniel [name] is a 85 year old male who presents today with his wife for recurrent painful, elongated, thickened toenails which he cannot trim himself and for calluses to both of his big toes.  He requested be seen today because the nails are getting very long causing irritation.  Denies any redness or drainage around the areas.Denies any systemic complaints such as fevers, chills, nausea, vomiting.   Objective: AAO 3, NAD DP/PT pulses palpable, CRT less than 3 seconds Protective sensation decreased with Simms Weinstein monofilament Nails hypertrophic, dystrophic, elongated, brittle, discolored 10. There is tenderness overlying the nails 1-5 bilaterally. There is no surrounding erythema or drainage along the nail sites. Hyperkerotic lesions bilateral sub-hallux although more minimal today. Upon debridement of the lesions, no underlying ulceration, drainage, or other signs of infection.  No pain with calf compression, swelling, warmth, erythema.  Assessment: Patient presents with symptomatic onychomycosis; hyperkerotic lesions  Plan: -Treatment options including alternatives, risks, complications were discussed -Nails sharply debrided 10 without complication/bleeding. -Hyperkerotic lesions sharply debrided x 2 without complications/bleeding.  Continue offloading and new pads were given today.  -Follow-up in 9 weeks for routine care or sooner if any problems are to arise. In the meantime, encouraged to call the office with any questions, concerns, changes symptoms.  Celesta Gentile, DPM

## 2018-06-29 DIAGNOSIS — Z794 Long term (current) use of insulin: Secondary | ICD-10-CM | POA: Diagnosis not present

## 2018-06-29 DIAGNOSIS — Z8546 Personal history of malignant neoplasm of prostate: Secondary | ICD-10-CM | POA: Diagnosis not present

## 2018-06-29 DIAGNOSIS — N183 Chronic kidney disease, stage 3 (moderate): Secondary | ICD-10-CM | POA: Diagnosis not present

## 2018-06-29 DIAGNOSIS — F1021 Alcohol dependence, in remission: Secondary | ICD-10-CM | POA: Diagnosis not present

## 2018-06-29 DIAGNOSIS — I509 Heart failure, unspecified: Secondary | ICD-10-CM | POA: Diagnosis not present

## 2018-06-29 DIAGNOSIS — E1122 Type 2 diabetes mellitus with diabetic chronic kidney disease: Secondary | ICD-10-CM | POA: Diagnosis not present

## 2018-06-29 DIAGNOSIS — E1140 Type 2 diabetes mellitus with diabetic neuropathy, unspecified: Secondary | ICD-10-CM | POA: Diagnosis not present

## 2018-06-29 DIAGNOSIS — G40909 Epilepsy, unspecified, not intractable, without status epilepticus: Secondary | ICD-10-CM | POA: Diagnosis not present

## 2018-06-29 DIAGNOSIS — F329 Major depressive disorder, single episode, unspecified: Secondary | ICD-10-CM | POA: Diagnosis not present

## 2018-06-29 DIAGNOSIS — E7800 Pure hypercholesterolemia, unspecified: Secondary | ICD-10-CM | POA: Diagnosis not present

## 2018-06-29 DIAGNOSIS — I2510 Atherosclerotic heart disease of native coronary artery without angina pectoris: Secondary | ICD-10-CM | POA: Diagnosis not present

## 2018-06-29 DIAGNOSIS — I130 Hypertensive heart and chronic kidney disease with heart failure and stage 1 through stage 4 chronic kidney disease, or unspecified chronic kidney disease: Secondary | ICD-10-CM | POA: Diagnosis not present

## 2018-06-29 DIAGNOSIS — N393 Stress incontinence (female) (male): Secondary | ICD-10-CM | POA: Diagnosis not present

## 2018-07-12 DIAGNOSIS — E1122 Type 2 diabetes mellitus with diabetic chronic kidney disease: Secondary | ICD-10-CM | POA: Diagnosis not present

## 2018-07-12 DIAGNOSIS — E1140 Type 2 diabetes mellitus with diabetic neuropathy, unspecified: Secondary | ICD-10-CM | POA: Diagnosis not present

## 2018-07-12 DIAGNOSIS — I130 Hypertensive heart and chronic kidney disease with heart failure and stage 1 through stage 4 chronic kidney disease, or unspecified chronic kidney disease: Secondary | ICD-10-CM | POA: Diagnosis not present

## 2018-07-12 DIAGNOSIS — I509 Heart failure, unspecified: Secondary | ICD-10-CM | POA: Diagnosis not present

## 2018-07-26 ENCOUNTER — Ambulatory Visit: Payer: Self-pay | Admitting: Podiatry

## 2018-07-26 DIAGNOSIS — E7800 Pure hypercholesterolemia, unspecified: Secondary | ICD-10-CM | POA: Diagnosis not present

## 2018-07-26 DIAGNOSIS — I2510 Atherosclerotic heart disease of native coronary artery without angina pectoris: Secondary | ICD-10-CM | POA: Diagnosis not present

## 2018-07-26 DIAGNOSIS — E118 Type 2 diabetes mellitus with unspecified complications: Secondary | ICD-10-CM | POA: Diagnosis not present

## 2018-07-26 DIAGNOSIS — N183 Chronic kidney disease, stage 3 (moderate): Secondary | ICD-10-CM | POA: Diagnosis not present

## 2018-08-16 ENCOUNTER — Ambulatory Visit: Payer: Medicare Other | Admitting: Podiatry

## 2018-08-30 DIAGNOSIS — N183 Chronic kidney disease, stage 3 (moderate): Secondary | ICD-10-CM | POA: Diagnosis not present

## 2018-08-30 DIAGNOSIS — E7800 Pure hypercholesterolemia, unspecified: Secondary | ICD-10-CM | POA: Diagnosis not present

## 2018-08-30 DIAGNOSIS — I2510 Atherosclerotic heart disease of native coronary artery without angina pectoris: Secondary | ICD-10-CM | POA: Diagnosis not present

## 2018-08-30 DIAGNOSIS — E118 Type 2 diabetes mellitus with unspecified complications: Secondary | ICD-10-CM | POA: Diagnosis not present

## 2018-09-01 DIAGNOSIS — H33052 Total retinal detachment, left eye: Secondary | ICD-10-CM | POA: Diagnosis not present

## 2018-09-01 DIAGNOSIS — H401233 Low-tension glaucoma, bilateral, severe stage: Secondary | ICD-10-CM | POA: Diagnosis not present

## 2018-09-01 DIAGNOSIS — Z961 Presence of intraocular lens: Secondary | ICD-10-CM | POA: Diagnosis not present

## 2018-09-06 ENCOUNTER — Ambulatory Visit: Payer: Medicare Other | Admitting: Podiatry

## 2018-09-08 ENCOUNTER — Ambulatory Visit: Payer: Medicare Other | Admitting: Podiatry

## 2018-09-21 DIAGNOSIS — G40909 Epilepsy, unspecified, not intractable, without status epilepticus: Secondary | ICD-10-CM | POA: Diagnosis not present

## 2018-09-21 DIAGNOSIS — Z9989 Dependence on other enabling machines and devices: Secondary | ICD-10-CM | POA: Diagnosis not present

## 2018-09-21 DIAGNOSIS — R29818 Other symptoms and signs involving the nervous system: Secondary | ICD-10-CM | POA: Diagnosis not present

## 2018-09-21 DIAGNOSIS — E1165 Type 2 diabetes mellitus with hyperglycemia: Secondary | ICD-10-CM | POA: Diagnosis not present

## 2018-09-28 ENCOUNTER — Ambulatory Visit: Payer: Medicare Other | Admitting: Podiatry

## 2018-10-11 ENCOUNTER — Encounter (HOSPITAL_COMMUNITY): Payer: Self-pay | Admitting: Emergency Medicine

## 2018-10-11 ENCOUNTER — Inpatient Hospital Stay (HOSPITAL_COMMUNITY)
Admission: EM | Admit: 2018-10-11 | Discharge: 2018-10-18 | DRG: 071 | Disposition: A | Discharge status: Skilled Nursing Facility | Payer: Medicare Other | Attending: Internal Medicine | Admitting: Internal Medicine

## 2018-10-11 ENCOUNTER — Other Ambulatory Visit: Payer: Self-pay

## 2018-10-11 ENCOUNTER — Emergency Department (HOSPITAL_COMMUNITY): Payer: Medicare Other

## 2018-10-11 DIAGNOSIS — R29810 Facial weakness: Secondary | ICD-10-CM | POA: Diagnosis not present

## 2018-10-11 DIAGNOSIS — Z794 Long term (current) use of insulin: Secondary | ICD-10-CM

## 2018-10-11 DIAGNOSIS — R278 Other lack of coordination: Secondary | ICD-10-CM | POA: Diagnosis not present

## 2018-10-11 DIAGNOSIS — F1020 Alcohol dependence, uncomplicated: Secondary | ICD-10-CM | POA: Diagnosis present

## 2018-10-11 DIAGNOSIS — R2681 Unsteadiness on feet: Secondary | ICD-10-CM | POA: Diagnosis present

## 2018-10-11 DIAGNOSIS — Z8546 Personal history of malignant neoplasm of prostate: Secondary | ICD-10-CM

## 2018-10-11 DIAGNOSIS — R269 Unspecified abnormalities of gait and mobility: Secondary | ICD-10-CM | POA: Diagnosis not present

## 2018-10-11 DIAGNOSIS — I959 Hypotension, unspecified: Secondary | ICD-10-CM | POA: Diagnosis not present

## 2018-10-11 DIAGNOSIS — I340 Nonrheumatic mitral (valve) insufficiency: Secondary | ICD-10-CM | POA: Diagnosis not present

## 2018-10-11 DIAGNOSIS — R2689 Other abnormalities of gait and mobility: Secondary | ICD-10-CM | POA: Diagnosis not present

## 2018-10-11 DIAGNOSIS — Z955 Presence of coronary angioplasty implant and graft: Secondary | ICD-10-CM | POA: Diagnosis not present

## 2018-10-11 DIAGNOSIS — R0609 Other forms of dyspnea: Secondary | ICD-10-CM | POA: Diagnosis not present

## 2018-10-11 DIAGNOSIS — Z9181 History of falling: Secondary | ICD-10-CM

## 2018-10-11 DIAGNOSIS — E1122 Type 2 diabetes mellitus with diabetic chronic kidney disease: Secondary | ICD-10-CM | POA: Diagnosis present

## 2018-10-11 DIAGNOSIS — J9811 Atelectasis: Secondary | ICD-10-CM | POA: Diagnosis not present

## 2018-10-11 DIAGNOSIS — G40909 Epilepsy, unspecified, not intractable, without status epilepticus: Secondary | ICD-10-CM | POA: Diagnosis not present

## 2018-10-11 DIAGNOSIS — Z20828 Contact with and (suspected) exposure to other viral communicable diseases: Secondary | ICD-10-CM | POA: Diagnosis not present

## 2018-10-11 DIAGNOSIS — I444 Left anterior fascicular block: Secondary | ICD-10-CM | POA: Diagnosis present

## 2018-10-11 DIAGNOSIS — Z66 Do not resuscitate: Secondary | ICD-10-CM | POA: Diagnosis not present

## 2018-10-11 DIAGNOSIS — F1029 Alcohol dependence with unspecified alcohol-induced disorder: Secondary | ICD-10-CM | POA: Diagnosis not present

## 2018-10-11 DIAGNOSIS — R4182 Altered mental status, unspecified: Secondary | ICD-10-CM | POA: Diagnosis not present

## 2018-10-11 DIAGNOSIS — K5900 Constipation, unspecified: Secondary | ICD-10-CM | POA: Diagnosis present

## 2018-10-11 DIAGNOSIS — R778 Other specified abnormalities of plasma proteins: Secondary | ICD-10-CM | POA: Diagnosis present

## 2018-10-11 DIAGNOSIS — M6281 Muscle weakness (generalized): Secondary | ICD-10-CM | POA: Diagnosis not present

## 2018-10-11 DIAGNOSIS — R32 Unspecified urinary incontinence: Secondary | ICD-10-CM | POA: Diagnosis present

## 2018-10-11 DIAGNOSIS — I440 Atrioventricular block, first degree: Secondary | ICD-10-CM | POA: Diagnosis not present

## 2018-10-11 DIAGNOSIS — F0390 Unspecified dementia without behavioral disturbance: Secondary | ICD-10-CM | POA: Diagnosis present

## 2018-10-11 DIAGNOSIS — Z8249 Family history of ischemic heart disease and other diseases of the circulatory system: Secondary | ICD-10-CM

## 2018-10-11 DIAGNOSIS — Z008 Encounter for other general examination: Secondary | ICD-10-CM | POA: Diagnosis not present

## 2018-10-11 DIAGNOSIS — I5022 Chronic systolic (congestive) heart failure: Secondary | ICD-10-CM | POA: Diagnosis not present

## 2018-10-11 DIAGNOSIS — I6529 Occlusion and stenosis of unspecified carotid artery: Secondary | ICD-10-CM | POA: Diagnosis present

## 2018-10-11 DIAGNOSIS — F1010 Alcohol abuse, uncomplicated: Secondary | ICD-10-CM | POA: Diagnosis not present

## 2018-10-11 DIAGNOSIS — I2510 Atherosclerotic heart disease of native coronary artery without angina pectoris: Secondary | ICD-10-CM | POA: Diagnosis not present

## 2018-10-11 DIAGNOSIS — N183 Chronic kidney disease, stage 3 unspecified: Secondary | ICD-10-CM | POA: Diagnosis present

## 2018-10-11 DIAGNOSIS — I429 Cardiomyopathy, unspecified: Secondary | ICD-10-CM | POA: Diagnosis present

## 2018-10-11 DIAGNOSIS — E1129 Type 2 diabetes mellitus with other diabetic kidney complication: Secondary | ICD-10-CM | POA: Diagnosis present

## 2018-10-11 DIAGNOSIS — G9341 Metabolic encephalopathy: Secondary | ICD-10-CM | POA: Diagnosis present

## 2018-10-11 DIAGNOSIS — R410 Disorientation, unspecified: Secondary | ICD-10-CM | POA: Diagnosis not present

## 2018-10-11 DIAGNOSIS — R569 Unspecified convulsions: Secondary | ICD-10-CM

## 2018-10-11 DIAGNOSIS — Z7982 Long term (current) use of aspirin: Secondary | ICD-10-CM

## 2018-10-11 DIAGNOSIS — R404 Transient alteration of awareness: Secondary | ICD-10-CM | POA: Diagnosis not present

## 2018-10-11 DIAGNOSIS — R4020 Unspecified coma: Secondary | ICD-10-CM | POA: Diagnosis not present

## 2018-10-11 DIAGNOSIS — M2550 Pain in unspecified joint: Secondary | ICD-10-CM | POA: Diagnosis not present

## 2018-10-11 DIAGNOSIS — R7989 Other specified abnormal findings of blood chemistry: Secondary | ICD-10-CM | POA: Diagnosis not present

## 2018-10-11 DIAGNOSIS — Z833 Family history of diabetes mellitus: Secondary | ICD-10-CM | POA: Diagnosis not present

## 2018-10-11 DIAGNOSIS — I5032 Chronic diastolic (congestive) heart failure: Secondary | ICD-10-CM | POA: Diagnosis not present

## 2018-10-11 DIAGNOSIS — R1312 Dysphagia, oropharyngeal phase: Secondary | ICD-10-CM | POA: Diagnosis not present

## 2018-10-11 DIAGNOSIS — I130 Hypertensive heart and chronic kidney disease with heart failure and stage 1 through stage 4 chronic kidney disease, or unspecified chronic kidney disease: Secondary | ICD-10-CM | POA: Diagnosis not present

## 2018-10-11 DIAGNOSIS — R41841 Cognitive communication deficit: Secondary | ICD-10-CM | POA: Diagnosis not present

## 2018-10-11 DIAGNOSIS — Z7401 Bed confinement status: Secondary | ICD-10-CM | POA: Diagnosis not present

## 2018-10-11 DIAGNOSIS — E1142 Type 2 diabetes mellitus with diabetic polyneuropathy: Secondary | ICD-10-CM | POA: Diagnosis present

## 2018-10-11 DIAGNOSIS — E785 Hyperlipidemia, unspecified: Secondary | ICD-10-CM | POA: Diagnosis not present

## 2018-10-11 DIAGNOSIS — Z79899 Other long term (current) drug therapy: Secondary | ICD-10-CM

## 2018-10-11 DIAGNOSIS — I129 Hypertensive chronic kidney disease with stage 1 through stage 4 chronic kidney disease, or unspecified chronic kidney disease: Secondary | ICD-10-CM | POA: Diagnosis not present

## 2018-10-11 DIAGNOSIS — E1149 Type 2 diabetes mellitus with other diabetic neurological complication: Secondary | ICD-10-CM | POA: Diagnosis not present

## 2018-10-11 LAB — COMPREHENSIVE METABOLIC PANEL
ALT: 42 U/L (ref 0–44)
AST: 75 U/L — ABNORMAL HIGH (ref 15–41)
Albumin: 2.6 g/dL — ABNORMAL LOW (ref 3.5–5.0)
Alkaline Phosphatase: 92 U/L (ref 38–126)
Anion gap: 7 (ref 5–15)
BUN: 22 mg/dL (ref 8–23)
CO2: 18 mmol/L — ABNORMAL LOW (ref 22–32)
Calcium: 8.2 mg/dL — ABNORMAL LOW (ref 8.9–10.3)
Chloride: 107 mmol/L (ref 98–111)
Creatinine, Ser: 1.41 mg/dL — ABNORMAL HIGH (ref 0.61–1.24)
GFR calc Af Amer: 53 mL/min — ABNORMAL LOW (ref >60)
GFR calc non Af Amer: 46 mL/min — ABNORMAL LOW (ref >60)
Glucose, Bld: 163 mg/dL — ABNORMAL HIGH (ref 70–99)
Potassium: 4 mmol/L (ref 3.5–5.1)
Sodium: 132 mmol/L — ABNORMAL LOW (ref 135–145)
Total Bilirubin: 0.8 mg/dL (ref 0.3–1.2)
Total Protein: 5.3 g/dL — ABNORMAL LOW (ref 6.5–8.1)

## 2018-10-11 LAB — CBC
HCT: 37 % — ABNORMAL LOW (ref 39.0–52.0)
Hemoglobin: 12.5 g/dL — ABNORMAL LOW (ref 13.0–17.0)
MCH: 33 pg (ref 26.0–34.0)
MCHC: 33.8 g/dL (ref 30.0–36.0)
MCV: 97.6 fL (ref 80.0–100.0)
Platelets: 124 10*3/uL — ABNORMAL LOW (ref 150–400)
RBC: 3.79 MIL/uL — ABNORMAL LOW (ref 4.22–5.81)
RDW: 12.9 % (ref 11.5–15.5)
WBC: 7.1 10*3/uL (ref 4.0–10.5)
nRBC: 0 % (ref 0.0–0.2)

## 2018-10-11 LAB — CBG MONITORING, ED: Glucose-Capillary: 125 mg/dL — ABNORMAL HIGH (ref 70–99)

## 2018-10-11 LAB — LACTIC ACID, PLASMA: Lactic Acid, Venous: 1.1 mmol/L (ref 0.5–1.9)

## 2018-10-11 MED ORDER — SODIUM CHLORIDE 0.9% FLUSH: 3.0000 mL | Freq: Once | INTRAVENOUS | Status: DC

## 2018-10-11 MED ORDER — SODIUM CHLORIDE 0.9 % IV BOLUS
500.0000 mL | Freq: Once | INTRAVENOUS | Status: AC
  Administered 2018-10-11: 500 mL via INTRAVENOUS

## 2018-10-11 MED ORDER — SODIUM CHLORIDE 0.9% FLUSH
3.0000 mL | Freq: Once | INTRAVENOUS | Status: AC
  Administered 2018-10-12: 09:00:00 3 mL via INTRAVENOUS

## 2018-10-11 NOTE — ED Provider Notes (Signed)
[facility] EMERGENCY DEPARTMENT Provider Note   CSN: [id number] Arrival date & time: 10/11/18  1751     History   Chief Complaint Chief Complaint  Patient presents with   Weakness   Altered Mental Status    HPI Daniel [name] is a 85 y.o. male.     HPI Patient presents to the emergency department with altered mental status over the last 2 days and difficulty walking.  The patient normally uses a walker but was unable to ambulate with this.  I spoke with the wife to obtain this history.  She states that he normally can tell you the month the year but is not able to do so today.  The patient does not always know the day.  The patient denies chest pain, shortness of breath, headache,blurred vision, neck pain, fever, cough, numbness, dizziness, anorexia, edema, abdominal pain, nausea, vomiting, diarrhea, rash, back pain, dysuria, hematemesis, bloody stool, near syncope, or syncope. Past Medical History:  Diagnosis Date   Alcohol abuse    Cancer ([place])    prostate   Cardiomyopathy [facility])    CHF (congestive heart failure) ([place])    Complication of anesthesia    Hx of seizure when put to sleep for surgery on 08/2009   Constipation    Coronary artery disease    Diabetes mellitus without complication ([place])    H/O hiatal hernia    Hyperlipidemia    Hypertension    Peripheral neuropathy    Seizures ([place])    Urinary incontinence     Patient Active Problem List   Diagnosis Date Noted   Hypoglycemia 07/19/2017   Hypotension 07/19/2017   Normochromic normocytic anemia 07/19/2017   Thrombocytopenia ([place]) 07/19/2017   CKD (chronic kidney disease), stage III ([place]) 07/19/2017   Seizure ([place]) 06/16/2017   Type II diabetes mellitus with neurological manifestations ([place]) 03/05/2015   Porokeratosis 72/11/4707   Chronic systolic CHF (congestive heart failure) ([place]) 01/28/2015   Coronary artery disease 01/28/2015   Bruit of left carotid artery  01/28/2015   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) 08/23/2014   H/O detached retina repair 08/23/2014   Acute on chronic renal failure ([place]) 04/17/2013   Dehydration 04/17/2013   Alcohol dependence ([place]) 04/17/2013   Transaminasemia 04/17/2013   DM2 (diabetes mellitus, type 2) ([place]) 04/17/2013   Near syncope 04/17/2013   UTI (lower urinary tract infection) 04/17/2013   Hematuria 04/17/2013    Past Surgical History:  Procedure Laterality Date   CATARACT EXTRACTION, BILATERAL     CORONARY ANGIOPLASTY     stents x 2   CORONARY STENT PLACEMENT     2004   PROSTATE SURGERY  2008   SEED IMPLANTS   RADIOACTIVE SEED IMPLANT     prostate   TONSILLECTOMY     1940's        Home Medications    Prior to Admission medications   Medication Sig Start Date End Date Taking? Authorizing Provider  ALPRAZolam (XANAX) 0.25 MG tablet Take 0.25 mg by mouth 2 (two) times daily as needed for anxiety.   Yes [provider]  aspirin 81 MG tablet Take 81 mg by mouth daily.    Yes [provider]  finasteride (PROSCAR) 5 MG tablet Take 5 mg by mouth daily.  01/09/13  Yes [provider]  insulin lispro (HUMALOG) 100 UNIT/ML KwikPen Inject 11 Units into the skin 2 (two) times a day. 06/02/18  Yes [provider]  latanoprost (XALATAN) 0.005 %  ophthalmic solution Place 1 drop into both eyes at bedtime.   Yes [provider]  levETIRAcetam (KEPPRA) 500 MG tablet Take 1 tablet (500 mg total) by mouth 2 (two) times daily. 01/24/18  Yes Black, Lezlie Octave, NP  magnesium oxide (MAG-OX) 400 MG tablet Take 400 mg by mouth daily.   Yes [provider]  Multiple Vitamin (MULTIVITAMIN) tablet Take 1 tablet by mouth every evening.    Yes [provider]  rosuvastatin (CRESTOR) 20 MG tablet Take 10 mg by mouth every evening.    Yes [provider]  TOUJEO SOLOSTAR 300 UNIT/ML SOPN Inject 18 Units into the skin at bedtime. 06/02/18   Yes [provider]  vitamin C (ASCORBIC ACID) 500 MG tablet Take 500 mg by mouth every evening.    Yes [provider]    Family History Family History  Problem Relation Age of Onset   Diabetes Mother    Heart disease Father    Heart disease Brother    Diabetes Brother     Social History Social History   Tobacco Use   Smoking status: Never Smoker   Smokeless tobacco: Never Used  Substance Use Topics   Alcohol use: Yes   Drug use: No     Allergies   Patient has no known allergies.   Review of Systems Review of Systems Level 5 caveat applies due to altered mental status  Physical Exam Updated Vital Signs BP 140/61    Pulse 64    Temp 98.6 F (37 C) (Oral)    Resp 18    Ht 5' 8.5" (1.74 m)    Wt 84.8 kg    SpO2 98%    BMI 28.02 kg/m   Physical Exam Vitals signs and nursing note reviewed.  Constitutional:      General: He is not in acute distress.    Appearance: He is well-developed.  HENT:     Head: Normocephalic and atraumatic.  Eyes:     Pupils: Pupils are equal, round, and reactive to light.  Neck:     Musculoskeletal: Normal range of motion and neck supple.  Cardiovascular:     Rate and Rhythm: Normal rate and regular rhythm.     Heart sounds: Normal heart sounds. No murmur. No friction rub. No gallop.   Pulmonary:     Effort: Pulmonary effort is normal. No respiratory distress.     Breath sounds: Normal breath sounds. No wheezing.  Abdominal:     General: Bowel sounds are normal. There is no distension.     Palpations: Abdomen is soft.     Tenderness: There is no abdominal tenderness.  Skin:    General: Skin is warm and dry.     Capillary Refill: Capillary refill takes less than 2 seconds.     Findings: No erythema or rash.  Neurological:     General: No focal deficit present.     Mental Status: He is alert. He is disoriented.     Sensory: No sensory deficit.     Motor: No abnormal muscle tone.     Coordination:  Coordination normal.  Psychiatric:        Behavior: Behavior normal.      ED Treatments / Results  Labs (all labs ordered are listed, but only abnormal results are displayed) Labs Reviewed  COMPREHENSIVE METABOLIC PANEL - Abnormal; Notable for the following components:      Result Value   Sodium 132 (*)    CO2 18 (*)  Glucose, Bld 163 (*)    Creatinine, Ser 1.41 (*)    Calcium 8.2 (*)    Total Protein 5.3 (*)    Albumin 2.6 (*)    AST 75 (*)    GFR calc non Af Amer 46 (*)    GFR calc Af Amer 53 (*)    All other components within normal limits  CBC - Abnormal; Notable for the following components:   RBC 3.79 (*)    Hemoglobin 12.5 (*)    HCT 37.0 (*)    Platelets 124 (*)    All other components within normal limits  CBG MONITORING, ED - Abnormal; Notable for the following components:   Glucose-Capillary 125 (*)    All other components within normal limits  LACTIC ACID, PLASMA  URINALYSIS, ROUTINE W REFLEX MICROSCOPIC  LACTIC ACID, PLASMA  CBG MONITORING, ED    EKG EKG Interpretation  Date/Time:  Tuesday October 11 2018 18:09:35 EDT Ventricular Rate:  67 PR Interval:  212 QRS Duration: 106 QT Interval:  458 QTC Calculation: 483 R Axis:   -72 Text Interpretation:  Sinus rhythm with 1st degree A-V block Low voltage QRS Left anterior fascicular block Cannot rule out Inferior infarct (masked by fascicular block?) , age undetermined Cannot rule out Anterior infarct , age undetermined No significant change since last tracing Confirmed by Blanchie Dessert [phone]) on 10/11/2018 6:50:28 PM   Radiology Ct Head Wo Contrast  Result Date: 10/11/2018 CLINICAL DATA:  Altered level of consciousness (LOC), unexplained. Increasing lethargy and generalized weakness. EXAM: CT HEAD WITHOUT CONTRAST TECHNIQUE: Contiguous axial images were obtained from the base of the skull through the vertex without intravenous contrast. COMPARISON:  Head CT 01/23/2018 FINDINGS: Brain: No evidence of  acute infarction, hemorrhage, hydrocephalus, extra-axial collection or mass lesion/mass effect. Advanced atrophy, unchanged from prior exam. Moderate chronic small vessel ischemia. Vascular: Atherosclerosis of skullbase vasculature without hyperdense vessel or abnormal calcification. Skull: No fracture or focal lesion. Sinuses/Orbits: Chronic fluid level in mucosal thickening in left side of sphenoid sinus. Chronic mucosal thickening of the right maxillary sinus and ethmoid air cells. Other: None. IMPRESSION: No acute intracranial abnormality. Stable atrophy and chronic small vessel ischemia. Electronically Signed   By: Keith Rake M.D.   On: 10/11/2018 21:34    Procedures Procedures (including critical care time)  Medications Ordered in ED Medications  sodium chloride flush (NS) 0.9 % injection 3 mL (has no administration in time range)  sodium chloride flush (NS) 0.9 % injection 3 mL (has no administration in time range)  sodium chloride 0.9 % bolus 500 mL (0 mLs Intravenous Stopped 10/11/18 2251)     Initial Impression / Assessment and Plan / ED Course  I have reviewed the triage vital signs and the nursing notes.  Pertinent labs & imaging results that were available during my care of the patient were reviewed by me and considered in my medical decision making (see chart for details).   The patient will need admission for further evaluation and work-up of his altered mental status along with gait alteration and weakness. I did order an MRI to further evaluate for possible stroke.  Patient is normally will use a walker and ambulate with a wife states he is not been able to do this.  He is also not been eating or drinking very well over the last 2 days.      Final Clinical Impressions(s) / ED Diagnoses   Final diagnoses:  None    ED Discharge Orders  None       Dalia Heading, PA-C 10/11/18 2320    Blanchie Dessert, MD 10/12/18 1623

## 2018-10-11 NOTE — ED Triage Notes (Signed)
Pt arrivess via EMS from home with wife for increased lethargy/ generalized weakness x2 weeks. Pt is alert to self, place, event but not date. Usually is a/ox4. Pt is normally ambulatory with walker, but is not able to walk today. EMS reports pt has some lower leg edema and some abd distention. 97% on room air, BP 116/48. Pt is able to follow commands.

## 2018-10-12 ENCOUNTER — Emergency Department (HOSPITAL_COMMUNITY): Payer: Medicare Other

## 2018-10-12 ENCOUNTER — Observation Stay (HOSPITAL_BASED_OUTPATIENT_CLINIC_OR_DEPARTMENT_OTHER): Payer: Medicare Other

## 2018-10-12 ENCOUNTER — Observation Stay (HOSPITAL_COMMUNITY): Payer: Medicare Other

## 2018-10-12 DIAGNOSIS — R7989 Other specified abnormal findings of blood chemistry: Secondary | ICD-10-CM | POA: Diagnosis not present

## 2018-10-12 DIAGNOSIS — G9341 Metabolic encephalopathy: Secondary | ICD-10-CM | POA: Diagnosis present

## 2018-10-12 DIAGNOSIS — R569 Unspecified convulsions: Secondary | ICD-10-CM

## 2018-10-12 DIAGNOSIS — E785 Hyperlipidemia, unspecified: Secondary | ICD-10-CM | POA: Diagnosis present

## 2018-10-12 DIAGNOSIS — I2510 Atherosclerotic heart disease of native coronary artery without angina pectoris: Secondary | ICD-10-CM

## 2018-10-12 DIAGNOSIS — I340 Nonrheumatic mitral (valve) insufficiency: Secondary | ICD-10-CM | POA: Diagnosis not present

## 2018-10-12 DIAGNOSIS — N183 Chronic kidney disease, stage 3 (moderate): Secondary | ICD-10-CM

## 2018-10-12 DIAGNOSIS — R778 Other specified abnormalities of plasma proteins: Secondary | ICD-10-CM | POA: Diagnosis present

## 2018-10-12 DIAGNOSIS — R2681 Unsteadiness on feet: Secondary | ICD-10-CM | POA: Diagnosis present

## 2018-10-12 LAB — BASIC METABOLIC PANEL
Anion gap: 5 (ref 5–15)
BUN: 18 mg/dL (ref 8–23)
CO2: 22 mmol/L (ref 22–32)
Calcium: 8.6 mg/dL — ABNORMAL LOW (ref 8.9–10.3)
Chloride: 111 mmol/L (ref 98–111)
Creatinine, Ser: 1.33 mg/dL — ABNORMAL HIGH (ref 0.61–1.24)
GFR calc Af Amer: 57 mL/min — ABNORMAL LOW (ref >60)
GFR calc non Af Amer: 49 mL/min — ABNORMAL LOW (ref >60)
Glucose, Bld: 103 mg/dL — ABNORMAL HIGH (ref 70–99)
Potassium: 4.9 mmol/L (ref 3.5–5.1)
Sodium: 138 mmol/L (ref 135–145)

## 2018-10-12 LAB — CBC
HCT: 37.7 % — ABNORMAL LOW (ref 39.0–52.0)
Hemoglobin: 12.5 g/dL — ABNORMAL LOW (ref 13.0–17.0)
MCH: 32.3 pg (ref 26.0–34.0)
MCHC: 33.2 g/dL (ref 30.0–36.0)
MCV: 97.4 fL (ref 80.0–100.0)
Platelets: 133 10*3/uL — ABNORMAL LOW (ref 150–400)
RBC: 3.87 MIL/uL — ABNORMAL LOW (ref 4.22–5.81)
RDW: 12.9 % (ref 11.5–15.5)
WBC: 7.3 10*3/uL (ref 4.0–10.5)
nRBC: 0 % (ref 0.0–0.2)

## 2018-10-12 LAB — TROPONIN I (HIGH SENSITIVITY)
Troponin I (High Sensitivity): 52 ng/L — ABNORMAL HIGH (ref <18)
Troponin I (High Sensitivity): 55 ng/L — ABNORMAL HIGH (ref <18)

## 2018-10-12 LAB — URINALYSIS, ROUTINE W REFLEX MICROSCOPIC
Bilirubin Urine: NEGATIVE
Glucose, UA: NEGATIVE mg/dL
Hgb urine dipstick: NEGATIVE
Ketones, ur: NEGATIVE mg/dL
Leukocytes,Ua: NEGATIVE
Nitrite: NEGATIVE
Protein, ur: 100 mg/dL — AB
Specific Gravity, Urine: 1.02 (ref 1.005–1.030)
pH: 7 (ref 5.0–8.0)

## 2018-10-12 LAB — ECHOCARDIOGRAM COMPLETE
Height: 68.5 in
Weight: 2992 oz

## 2018-10-12 LAB — TSH: TSH: 2.253 u[IU]/mL (ref 0.350–4.500)

## 2018-10-12 LAB — GLUCOSE, CAPILLARY
Glucose-Capillary: 118 mg/dL — ABNORMAL HIGH (ref 70–99)
Glucose-Capillary: 194 mg/dL — ABNORMAL HIGH (ref 70–99)
Glucose-Capillary: 220 mg/dL — ABNORMAL HIGH (ref 70–99)

## 2018-10-12 LAB — VITAMIN B12: Vitamin B-12: 1089 pg/mL — ABNORMAL HIGH (ref 180–914)

## 2018-10-12 LAB — BRAIN NATRIURETIC PEPTIDE: B Natriuretic Peptide: 509.1 pg/mL — ABNORMAL HIGH (ref 0.0–100.0)

## 2018-10-12 LAB — URINALYSIS, MICROSCOPIC (REFLEX)
Bacteria, UA: NONE SEEN
Squamous Epithelial / HPF: NONE SEEN (ref 0–5)

## 2018-10-12 LAB — SARS CORONAVIRUS 2 BY RT PCR (HOSPITAL ORDER, PERFORMED IN ~~LOC~~ HOSPITAL LAB): SARS Coronavirus 2: NEGATIVE

## 2018-10-12 LAB — AMMONIA: Ammonia: 29 umol/L (ref 9–35)

## 2018-10-12 LAB — LACTIC ACID, PLASMA: Lactic Acid, Venous: 1.3 mmol/L (ref 0.5–1.9)

## 2018-10-12 LAB — CBG MONITORING, ED: Glucose-Capillary: 88 mg/dL (ref 70–99)

## 2018-10-12 MED ORDER — VITAMIN B-1 100 MG PO TABS
100.0000 mg | ORAL_TABLET | Freq: Every day | ORAL | Status: DC
  Administered 2018-10-12 – 2018-10-17 (×5): 100 mg via ORAL
  Filled 2018-10-12 (×5): qty 1

## 2018-10-12 MED ORDER — ONDANSETRON HCL 4 MG/2ML IJ SOLN: 4.0000 mg | Freq: Four times a day (QID) | INTRAMUSCULAR | Status: DC | PRN

## 2018-10-12 MED ORDER — ACETAMINOPHEN 650 MG RE SUPP: 650.0000 mg | Freq: Four times a day (QID) | RECTAL | Status: DC | PRN

## 2018-10-12 MED ORDER — LORAZEPAM 2 MG/ML IJ SOLN: 0.0000 mg | Freq: Two times a day (BID) | INTRAMUSCULAR | Status: AC

## 2018-10-12 MED ORDER — ENOXAPARIN SODIUM 40 MG/0.4ML ~~LOC~~ SOLN
40.0000 mg | Freq: Every day | SUBCUTANEOUS | Status: DC
  Administered 2018-10-12 – 2018-10-17 (×6): 40 mg via SUBCUTANEOUS
  Filled 2018-10-12 (×6): qty 0.4

## 2018-10-12 MED ORDER — ONDANSETRON HCL 4 MG PO TABS: 4.0000 mg | ORAL_TABLET | Freq: Four times a day (QID) | ORAL | Status: DC | PRN

## 2018-10-12 MED ORDER — FOLIC ACID 1 MG PO TABS
1.0000 mg | ORAL_TABLET | Freq: Every day | ORAL | Status: DC
  Administered 2018-10-12 – 2018-10-17 (×6): 1 mg via ORAL
  Filled 2018-10-12 (×6): qty 1

## 2018-10-12 MED ORDER — ROSUVASTATIN CALCIUM 5 MG PO TABS
10.0000 mg | ORAL_TABLET | Freq: Every evening | ORAL | Status: DC
  Administered 2018-10-12 – 2018-10-16 (×5): 10 mg via ORAL
  Filled 2018-10-12 (×5): qty 2

## 2018-10-12 MED ORDER — INSULIN ASPART 100 UNIT/ML ~~LOC~~ SOLN
0.0000 [IU] | Freq: Every day | SUBCUTANEOUS | Status: DC
  Administered 2018-10-12 – 2018-10-15 (×2): 2 [IU] via SUBCUTANEOUS

## 2018-10-12 MED ORDER — FINASTERIDE 5 MG PO TABS
5.0000 mg | ORAL_TABLET | Freq: Every day | ORAL | Status: DC
  Administered 2018-10-12 – 2018-10-17 (×6): 5 mg via ORAL
  Filled 2018-10-12 (×6): qty 1

## 2018-10-12 MED ORDER — ADULT MULTIVITAMIN W/MINERALS CH
1.0000 | ORAL_TABLET | Freq: Every day | ORAL | Status: DC
  Administered 2018-10-12 – 2018-10-17 (×6): 1 via ORAL
  Filled 2018-10-12 (×5): qty 1

## 2018-10-12 MED ORDER — ASPIRIN EC 81 MG PO TBEC
81.0000 mg | DELAYED_RELEASE_TABLET | Freq: Every day | ORAL | Status: DC
  Administered 2018-10-12 – 2018-10-17 (×6): 81 mg via ORAL
  Filled 2018-10-12 (×6): qty 1

## 2018-10-12 MED ORDER — ACETAMINOPHEN 325 MG PO TABS
650.0000 mg | ORAL_TABLET | Freq: Four times a day (QID) | ORAL | Status: DC | PRN
  Filled 2018-10-12: qty 2

## 2018-10-12 MED ORDER — LEVETIRACETAM IN NACL 500 MG/100ML IV SOLN
500.0000 mg | Freq: Two times a day (BID) | INTRAVENOUS | Status: DC
  Administered 2018-10-12: 05:00:00 500 mg via INTRAVENOUS
  Filled 2018-10-12: qty 100

## 2018-10-12 MED ORDER — LEVETIRACETAM 500 MG PO TABS
500.0000 mg | ORAL_TABLET | Freq: Two times a day (BID) | ORAL | Status: DC
  Administered 2018-10-12 – 2018-10-17 (×12): 500 mg via ORAL
  Filled 2018-10-12 (×12): qty 1

## 2018-10-12 MED ORDER — LORAZEPAM 2 MG/ML IJ SOLN: 1.0000 mg | Freq: Four times a day (QID) | INTRAMUSCULAR | Status: AC | PRN

## 2018-10-12 MED ORDER — ONE-DAILY MULTI VITAMINS PO TABS: 1.0000 | ORAL_TABLET | Freq: Every evening | ORAL | Status: DC

## 2018-10-12 MED ORDER — VITAMIN C 500 MG PO TABS
500.0000 mg | ORAL_TABLET | Freq: Every evening | ORAL | Status: DC
  Administered 2018-10-12 – 2018-10-16 (×5): 500 mg via ORAL
  Filled 2018-10-12 (×5): qty 1

## 2018-10-12 MED ORDER — THIAMINE HCL 100 MG/ML IJ SOLN
100.0000 mg | Freq: Every day | INTRAMUSCULAR | Status: DC
  Administered 2018-10-15: 100 mg via INTRAVENOUS
  Filled 2018-10-12: qty 2

## 2018-10-12 MED ORDER — LORAZEPAM 2 MG/ML IJ SOLN: 1.0000 mg | INTRAMUSCULAR | Status: DC | PRN

## 2018-10-12 MED ORDER — FUROSEMIDE 10 MG/ML IJ SOLN
20.0000 mg | Freq: Every day | INTRAMUSCULAR | Status: DC
  Administered 2018-10-12 – 2018-10-17 (×6): 20 mg via INTRAVENOUS
  Filled 2018-10-12 (×6): qty 2

## 2018-10-12 MED ORDER — LORAZEPAM 1 MG PO TABS: 1.0000 mg | ORAL_TABLET | Freq: Four times a day (QID) | ORAL | Status: AC | PRN

## 2018-10-12 MED ORDER — LATANOPROST 0.005 % OP SOLN
1.0000 [drp] | Freq: Every day | OPHTHALMIC | Status: DC
  Administered 2018-10-12 – 2018-10-16 (×5): 1 [drp] via OPHTHALMIC
  Filled 2018-10-12: qty 2.5

## 2018-10-12 MED ORDER — INSULIN ASPART 100 UNIT/ML ~~LOC~~ SOLN
0.0000 [IU] | Freq: Three times a day (TID) | SUBCUTANEOUS | Status: DC
  Administered 2018-10-13 (×2): 3 [IU] via SUBCUTANEOUS
  Administered 2018-10-14: 1 [IU] via SUBCUTANEOUS
  Administered 2018-10-14: 3 [IU] via SUBCUTANEOUS
  Administered 2018-10-15 (×2): 2 [IU] via SUBCUTANEOUS
  Administered 2018-10-15: 1 [IU] via SUBCUTANEOUS
  Administered 2018-10-16: 3 [IU] via SUBCUTANEOUS
  Administered 2018-10-16: 1 [IU] via SUBCUTANEOUS
  Administered 2018-10-16: 17:00:00 2 [IU] via SUBCUTANEOUS
  Administered 2018-10-17: 1 [IU] via SUBCUTANEOUS

## 2018-10-12 MED ORDER — HYDRALAZINE HCL 20 MG/ML IJ SOLN: 5.0000 mg | INTRAMUSCULAR | Status: DC | PRN

## 2018-10-12 NOTE — Consult Note (Addendum)
Cardiology Consultation:   Patient ID: Daniel [name] MRN: [id number]; DOB: 01-Apr-1933  Admit date: 10/11/2018 Date of Consult: 10/12/2018  Primary Care Provider: Jani Gravel, MD Primary Cardiologist: Sanda Klein, MD  Primary Electrophysiologist:  None    Patient Profile:   Daniel [name] is a 85 y.o. male with a hx of CAD s/p PCI to LAD and RCA (2004), hx of ischemic cardiomyopathy and systolic heart failure with EF of 20-30% (2004) improved to 60-65%, hx of significant alcohol use (abstinent for the last 5 years), DM, CKD stage III, HTN, HLD, and chronic dyspnea on exertion who is being seen today for the evaluation of abnormal EKG at the request of Dr. Ree Kida.  History of Present Illness:   Daniel [name] has a cardiac history dating back to 2004 when he received two stents to his RCA and LAD. He does have residual disease in the Cx with 50-70% in the entire mid and distal artery. EF at that time was 25-30%, which improved and is now 60-65% as of last echo (2019). He established care with Dr. Sallyanne Kuster in 2016. He was noted to have low functional status (lift chair at home) and chronic dyspnea on exertion. He was last seen by this service in 06/20/17 during a hospitalization for AMS, seizure, and found to have elevated troponin. Troponin elevation felt to be inconsistent with ACS, no further workup was recommended.   He presented back to [facility] with increased lethargy and genearlized weakness for the last 2 weeks. He is oriented to person and place, but not date. History gathered from wife. She denies complaints of chest pain. No leukocytosis, lactic acid was 1.1, renal function near baseline, no facial droop noted. Head CT and MRI brain normal. EKG obtained with questionable changes. Cardiology was consulted.  Troponin trended in the setting of possible EKG changes and found to be 52 --> 55.  I called and spoke with the patient's wife. He is apparently verbally abusive to her, likely  related to his dementia. She denies any complaints of chest pain, new SOB, orthopnea, and lower extremity swelling. He does not take anti-hypertensives at home. ASA and crestor. She is mostly worried about his gait.   On my exam, patient is pleasantly confused. He is still not oriented to date. He denies cardiac symptoms, including chest pain. He does not recall his prior stents. He has no complaints and wants to discharge home.     Heart Pathway Score:     Past Medical History:  Diagnosis Date   Alcohol abuse    Cancer ([place])    prostate   Cardiomyopathy ([place])    CHF (congestive heart failure) ([place])    Complication of anesthesia    Hx of seizure when put to sleep for surgery on 08/2009   Constipation    Coronary artery disease    Diabetes mellitus without complication ([place])    H/O hiatal hernia    Hyperlipidemia    Hypertension    Peripheral neuropathy    Seizures ([place])    Urinary incontinence     Past Surgical History:  Procedure Laterality Date   CATARACT EXTRACTION, BILATERAL     CORONARY ANGIOPLASTY     stents x 2   CORONARY STENT PLACEMENT     2004   PROSTATE SURGERY  2008   SEED IMPLANTS   RADIOACTIVE SEED IMPLANT     prostate   TONSILLECTOMY     1940's     Home Medications:  Prior to Admission  medications   Medication Sig Start Date End Date Taking? Authorizing Provider  ALPRAZolam (XANAX) 0.25 MG tablet Take 0.25 mg by mouth 2 (two) times daily as needed for anxiety.   Yes [provider]  aspirin 81 MG tablet Take 81 mg by mouth daily.    Yes [provider]  finasteride (PROSCAR) 5 MG tablet Take 5 mg by mouth daily.  01/09/13  Yes [provider]  insulin lispro (HUMALOG) 100 UNIT/ML KwikPen Inject 11 Units into the skin 2 (two) times a day. 06/02/18  Yes [provider]  latanoprost (XALATAN) 0.005 % ophthalmic solution Place 1 drop into both eyes at bedtime.   Yes [provider]    levETIRAcetam (KEPPRA) 500 MG tablet Take 1 tablet (500 mg total) by mouth 2 (two) times daily. 01/24/18  Yes Black, Lezlie Octave, NP  magnesium oxide (MAG-OX) 400 MG tablet Take 400 mg by mouth daily.   Yes [provider]  Multiple Vitamin (MULTIVITAMIN) tablet Take 1 tablet by mouth every evening.    Yes [provider]  rosuvastatin (CRESTOR) 20 MG tablet Take 10 mg by mouth every evening.    Yes [provider]  TOUJEO SOLOSTAR 300 UNIT/ML SOPN Inject 18 Units into the skin at bedtime. 06/02/18  Yes [provider]  vitamin C (ASCORBIC ACID) 500 MG tablet Take 500 mg by mouth every evening.    Yes [provider]    Inpatient Medications: Scheduled Meds:  aspirin EC  81 mg Oral Daily   enoxaparin (LOVENOX) injection  40 mg Subcutaneous Daily   finasteride  5 mg Oral Daily   folic acid  1 mg Oral Daily   furosemide  20 mg Intravenous Daily   insulin aspart  0-5 Units Subcutaneous QHS   insulin aspart  0-9 Units Subcutaneous TID WC   latanoprost  1 drop Both Eyes QHS   levETIRAcetam  500 mg Oral BID   LORazepam  0-4 mg Intravenous BID   Followed by   Derrill Memo ON 10/13/2018] LORazepam  0-4 mg Intravenous Q12H   multivitamin with minerals  1 tablet Oral Daily   rosuvastatin  10 mg Oral QPM   sodium chloride flush  3 mL Intravenous Once   thiamine  100 mg Oral Daily   Or   thiamine  100 mg Intravenous Daily   vitamin C  500 mg Oral QPM   Continuous Infusions:  PRN Meds: acetaminophen **OR** acetaminophen, hydrALAZINE, LORazepam, LORazepam **OR** LORazepam, ondansetron **OR** ondansetron (ZOFRAN) IV  Allergies:   No Known Allergies  Social History:   Social History   Socioeconomic History   Marital status: Married    Spouse name: Not on file   Number of children: Not on file   Years of education: Not on file   Highest education level: Not on file  Occupational History   Not on file  Social Needs   Financial  resource strain: Not on file   Food insecurity    Worry: Not on file    Inability: Not on file   Transportation needs    Medical: Not on file    Non-medical: Not on file  Tobacco Use   Smoking status: Never Smoker   Smokeless tobacco: Never Used  Substance and Sexual Activity   Alcohol use: Yes   Drug use: No   Sexual activity: Never  Lifestyle   Physical activity    Days per week: Not on file    Minutes per session: Not on  file   Stress: Not on file  Relationships   Social connections    Talks on phone: Not on file    Gets together: Not on file    Attends religious service: Not on file    Active member of club or organization: Not on file    Attends meetings of clubs or organizations: Not on file    Relationship status: Not on file   Intimate partner violence    Fear of current or ex partner: Not on file    Emotionally abused: Not on file    Physically abused: Not on file    Forced sexual activity: Not on file  Other Topics Concern   Not on file  Social History Narrative   Not on file    Family History:    Family History  Problem Relation Age of Onset   Diabetes Mother    Heart disease Father    Heart disease Brother    Diabetes Brother      ROS:  Please see the history of present illness.  Unable to fully complete - Level 5 Caveat   Physical Exam/Data:   Vitals:   10/12/18 0100 10/12/18 0130 10/12/18 0202 10/12/18 0453  BP: 134/67 140/64 (!) 128/49 129/69  Pulse: 66 61 65 67  Resp: 20 17 18 18   Temp:   97.7 F (36.5 C) 97.8 F (36.6 C)  TempSrc:   Oral Oral  SpO2: 99% 98% 99% 98%  Weight:      Height:        Intake/Output Summary (Last 24 hours) at 10/12/2018 0918 Last data filed at 10/11/2018 2251 Gross per 24 hour  Intake 450 ml  Output --  Net 450 ml   Last 3 Weights 10/11/2018 01/23/2018 01/23/2018  Weight (lbs) 187 lb 184 lb 1.4 oz 181 lb 10.5 oz  Weight (kg) 84.823 kg 83.5 kg 82.4 kg     Body mass index is 28.02 kg/m.   General:  Well nourished, well developed, in no acute distress HEENT: normal Neck: no JVD Endocrine:  No thryomegaly Vascular: no carotid bruits Cardiac:  normal S1, S2; RRR; no murmur  Lungs:  clear to auscultation bilaterally, no wheezing, rhonchi or rales  Abd: soft, nontender, no hepatomegaly  Ext: 1+ B LE edema Musculoskeletal:  No deformities, BUE and BLE strength normal and equal Skin: warm and dry  Neuro:  CNs 2-12 intact, no focal abnormalities noted Psych:  Normal affect   EKG:  The EKG was personally reviewed and demonstrates:  Sinus rhythm with HR 67, left anterior fascicular block (old), with poor R wave progression Telemetry:  Telemetry was personally reviewed and demonstrates:  Sinus rhythm HR 60-80s, PVCs  Relevant CV Studies:  Echo 06/19/17: Study Conclusions - Left ventricle: The cavity size was normal. Systolic function was   normal. The estimated ejection fraction was in the range of 60%   to 65%. Wall motion was normal; there were no regional wall   motion abnormalities by definity contrast   Laboratory Data:  High Sensitivity Troponin:   Recent Labs  Lab 10/12/18 0222 10/12/18 0543  TROPONINIHS 52* 55*     Cardiac EnzymesNo results for input(s): TROPONINI in the last 168 hours. No results for input(s): TROPIPOC in the last 168 hours.  Chemistry Recent Labs  Lab 10/11/18 1810 10/12/18 0222  NA 132* 138  K 4.0 4.9  CL 107 111  CO2 18* 22  GLUCOSE 163* 103*  BUN 22 18  CREATININE 1.41* 1.33*  CALCIUM 8.2* 8.6*  GFRNONAA 46* 49*  GFRAA 53* 57*  ANIONGAP 7 5    Recent Labs  Lab 10/11/18 1810  PROT 5.3*  ALBUMIN 2.6*  AST 75*  ALT 42  ALKPHOS 92  BILITOT 0.8   Hematology Recent Labs  Lab 10/11/18 1810 10/12/18 0222  WBC 7.1 7.3  RBC 3.79* 3.87*  HGB 12.5* 12.5*  HCT 37.0* 37.7*  MCV 97.6 97.4  MCH 33.0 32.3  MCHC 33.8 33.2  RDW 12.9 12.9  PLT 124* 133*   BNP Recent Labs  Lab 10/12/18 0222  BNP 509.1*    DDimer No  results for input(s): DDIMER in the last 168 hours.   Radiology/Studies:  Ct Head Wo Contrast  Result Date: 10/11/2018 CLINICAL DATA:  Altered level of consciousness (LOC), unexplained. Increasing lethargy and generalized weakness. EXAM: CT HEAD WITHOUT CONTRAST TECHNIQUE: Contiguous axial images were obtained from the base of the skull through the vertex without intravenous contrast. COMPARISON:  Head CT 01/23/2018 FINDINGS: Brain: No evidence of acute infarction, hemorrhage, hydrocephalus, extra-axial collection or mass lesion/mass effect. Advanced atrophy, unchanged from prior exam. Moderate chronic small vessel ischemia. Vascular: Atherosclerosis of skullbase vasculature without hyperdense vessel or abnormal calcification. Skull: No fracture or focal lesion. Sinuses/Orbits: Chronic fluid level in mucosal thickening in left side of sphenoid sinus. Chronic mucosal thickening of the right maxillary sinus and ethmoid air cells. Other: None. IMPRESSION: No acute intracranial abnormality. Stable atrophy and chronic small vessel ischemia. Electronically Signed   By: Keith Rake M.D.   On: 10/11/2018 21:34   Mr Brain Wo Contrast (neuro Protocol)  Result Date: 10/12/2018 CLINICAL DATA:  Altered level of consciousness EXAM: MRI HEAD WITHOUT CONTRAST TECHNIQUE: Multiplanar, multiecho pulse sequences of the brain and surrounding structures were obtained without intravenous contrast. COMPARISON:  CT from yesterday FINDINGS: Brain: No acute infarction, hemorrhage, hydrocephalus, extra-axial collection or mass lesion. Advanced generalized brain atrophy. Periventricular chronic small vessel ischemic change, mild for age. Small remote right cerebellar infarcts. Vascular: Major flow voids are preserved Skull and upper cervical spine: Negative for marrow lesion Sinuses/Orbits: Bilateral cataract resection and left scleral banding. Incidental mucosal thickening in the left sphenoid and right maxillary sinuses with  right maxillary sinus atelectasis. IMPRESSION: 1. No acute or reversible finding. 2. Advanced brain atrophy. Electronically Signed   By: Monte Fantasia M.D.   On: 10/12/2018 04:49   Dg Chest Port 1 View  Result Date: 10/12/2018 CLINICAL DATA:  MRI clearance. EXAM: PORTABLE CHEST 1 VIEW COMPARISON:  Chest radiograph dated 01/23/2018 FINDINGS: Shallow inspiration with bibasilar atelectasis. No new consolidative changes. There is no pleural effusion or pneumothorax. Stable cardiomegaly. Atherosclerotic calcification of the aortic arch. Several tiny linear radiopaque densities over the left hemidiaphragm, likely surgical clips. No other radiopaque foreign object identified. IMPRESSION: No acute findings.  No interval change. Electronically Signed   By: Anner Crete M.D.   On: 10/12/2018 00:43    Assessment and Plan:   1. Elevated troponin 2. Abnormal EKG 3. History of PCI in 2004, no intervention since - EKG appears to be stable from prior with sinus rhythm and left anterior fascicular block, poor R wave progression is seen on prior tracings - hs troponin is low and flat, inconsistent with ACS process - pt and wife deny complaints of chest pain and new cardiac symptoms different from his baseline - no further workup recommended at this time   4. Carotid stenosis - last evaluated in 2016 with R > L stenosis - right 60-79%  and left 40-59%, no old studies for comparison   5. Dementia 6. Low functional capacity - does not ambulate a lot at home, uses walker   7. Hx of alcoholism - has been abstinent for the last 5 years, per wife        For questions or updates, please contact [place] Please consult [email or web address] for contact info under     Signed, Ledora Bottcher, [place]  10/12/2018 9:18 AM

## 2018-10-12 NOTE — Progress Notes (Signed)
  Echocardiogram 2D Echocardiogram has been performed.  Burnett Kanaris 10/12/2018, 11:57 AM

## 2018-10-12 NOTE — Progress Notes (Signed)
Brief cardiology update note:  Echo reviewed, EF somewhat diminished from 06/19/17 with contrast but similar to study from 06/18/17 that did not have contrast. As these are noncontrasted images I think it is likely not significantly different from prior. Global hypokinesis, not focal. No indication for inpatient further workup at this time.  Buford Dresser, MD, PhD [facility]  [address] [place], [place] [address] [phone]

## 2018-10-12 NOTE — Evaluation (Signed)
Occupational Therapy Evaluation Patient Details Name: Daniel [name] MRN: [id number] DOB: 10/02/1933 Today's Date: 10/12/2018    History of Present Illness 85 y.o. male with medical history significant of hypertension, hyperlipidemia, diabetes mellitus, alcohol abuse, seizure, CAD, stent placement, prostate cancer (radiation seeds placement), CKD stage III, who presents with generalized weakness, gait instability and AMS.   Clinical Impression   Pt admitted with above and presents to OT with impairments impacting ability to complete ADLs and functional mobility at [facility].  Pt required increased time for processing and initiation, only oriented to self and place.  Required mod assist for sit > stand and min assist with increased time for stand pivot transfer with slow, shuffling steps.  Pt incontinent of urine with no awareness, requiring cues and setup to complete hygiene.  Required assistance to don socks due to decreased ROM and problem solving.  Returned to bed for ECHO.  Pt will benefit from OT acutely to decrease burden of care with ADLs prior to d/c home with [facility] and 24/7 supervision.    Follow Up Recommendations  Home health OT;Supervision/Assistance - 24 hour    Equipment Recommendations  None recommended by OT       Precautions / Restrictions Precautions Precautions: Fall      Mobility Bed Mobility Overal bed mobility: Needs Assistance Bed Mobility: Rolling;Supine to Sit Rolling: Min assist   Supine to sit: Mod assist        Transfers Overall transfer level: Needs assistance Equipment used: Rolling walker (2 wheeled) Transfers: Sit to/from [facility] Sit to Stand: Mod assist Stand pivot transfers: Mod assist       General transfer comment: slow shuffling steps during stand pivot transfer        ADL either performed or assessed with clinical judgement   ADL Overall ADL's : Needs assistance/impaired     Grooming: Wash/dry hands;Wash/dry  face;Sitting;Set up;Supervision/safety   Upper Body Bathing: Set up;Supervision/ safety;Sitting   Lower Body Bathing: Minimal assistance;Sit to/from stand       Lower Body Dressing: Maximal assistance   Toilet Transfer: BSC;Stand-pivot;Moderate assistance;RW Toilet Transfer Details (indicate cue type and reason): Mod assist sit > stand and slow shuffling steps during stand pivot to recliner to simulate toilet transfer         Functional mobility during ADLs: Moderate assistance;Rolling walker;Cueing for safety;Cueing for sequencing           Hand Dominance Right   Extremity/Trunk Assessment Upper Extremity Assessment Upper Extremity Assessment: Overall WFL for tasks assessed   Lower Extremity Assessment Lower Extremity Assessment: Defer to PT evaluation       Communication Communication Communication: No difficulties   Cognition Arousal/Alertness: Awake/alert Behavior During Therapy: WFL for tasks assessed/performed Overall Cognitive Status: Impaired/Different from baseline Area of Impairment: Orientation;Memory;Following commands;Problem solving;Awareness                 Orientation Level: Disoriented to;Time;Situation   Memory: Decreased short-term memory Following Commands: Follows one step commands with increased time;Follows one step commands inconsistently     Problem Solving: Slow processing;Decreased initiation;Difficulty sequencing;Requires verbal cues;Requires tactile cues                Home Living Family/patient expects to be discharged to:: Private residence Living Arrangements: Spouse/significant other Available Help at Discharge: Family;Available 24 hours/day Type of Home: House Home Access: Stairs to enter [employer] of Steps: pt with inconsistent response   Home Layout: Two level Alternate Level Stairs-Number of Steps: stair lift  Bathroom Shower/Tub: Occupational psychologist: Standard     Home  Equipment: Clinical cytogeneticist - 2 wheels          Prior Functioning/Environment Level of Independence: Needs assistance  Gait / Transfers Assistance Needed: Used RW for mobility ADL's / Homemaking Assistance Needed: per previous admission - wife assists pt in shower            OT Problem List: Decreased activity tolerance;Impaired balance (sitting and/or standing);Decreased cognition;Decreased safety awareness      OT Treatment/Interventions: Self-care/ADL training;DME and/or AE instruction;Therapeutic activities;Cognitive remediation/compensation;Patient/family education;Balance training    OT Goals(Current goals can be found in the care plan section) Acute Rehab OT Goals Patient Stated Goal: to go home OT Goal Formulation: With patient Time For Goal Achievement: 10/26/18 Potential to Achieve Goals: Good  OT Frequency: Min 2X/week    AM-PAC OT "6 Clicks" Daily Activity     Outcome Measure Help from another person eating meals?: A Little Help from another person taking care of personal grooming?: A Little Help from another person toileting, which includes using toliet, bedpan, or urinal?: A Lot Help from another person bathing (including washing, rinsing, drying)?: A Lot Help from another person to put on and taking off regular upper body clothing?: A Little Help from another person to put on and taking off regular lower body clothing?: A Lot 6 Click Score: 15   End of Session Equipment Utilized During Treatment: Gait belt;Rolling walker Nurse Communication: (incontinent of urine)  Activity Tolerance: Patient tolerated treatment well Patient left: in bed;with call bell/phone within reach  OT Visit Diagnosis: Unsteadiness on feet (R26.81);Muscle weakness (generalized) (M62.81);Other abnormalities of gait and mobility (R26.89);Cognitive communication deficit (R41.841)                Time: 2010-0712 OT Time Calculation (min): 39 min Charges:  OT General Charges $OT Visit:  1 Visit OT Evaluation $OT Eval Moderate Complexity: 1 Mod OT Treatments $Self Care/Home Management : 23-37 mins   Simonne Come, [phone] 10/12/2018, 1:04 PM

## 2018-10-12 NOTE — H&P (Addendum)
History and Physical    Daniel [name]:[id number] DOB: 1934-01-14 DOA: 10/11/2018  Referring MD/NP/PA:   PCP: Daniel Gravel, MD   Patient coming from:  The patient is coming from home.  At baseline, pt is independent for most of ADL.        Chief Complaint: Generalized weakness, gait instability and AMS  HPI: Daniel [name] is a 85 y.o. male with medical history significant of hypertension, hyperlipidemia, diabetes mellitus, alcohol abuse, seizure, CAD, stent placement, prostate cancer (radiation seeds placement), CKD stage III, who presents with generalized weakness, gait instability and AMS.  Per his wife (I called his wife by phone),  Pt has been having generalized weakness and gait instability in the past several days.  His wife is very concerned that the patient may fall, but fortunately patient did not fall. The patient normally uses a walker, but was unable to ambulate with this. Wife states that pt normally can tell you the month and year, but is not able to do so today.  The patient does not always know the day.  Patient does not have facial droop or slurred speech.  Patient does not have any chest pain, shortness of breath, fever or chills.  He has some mild dry cough.  No nausea vomiting, diarrhea, abdominal pain, symptoms of UTI.  ED Course: pt was found to have WBC 7.1, lactic acid 1.1, renal function close to baseline, liver function (ALP 92, AST 75, ALT 42, total bilirubin 0.8), pending COVID-19 test, temperature normal, blood pressure 125/57, heart rate 63, oxygen saturation 98% on room air.  CT head is negative for acute intracranial abnormalities.  Review of Systems: Could not reviewed accurately due to altered mental status.  Allergy: No Known Allergies  Past Medical History:  Diagnosis Date  . Alcohol abuse   . Cancer [facility])    prostate  . Cardiomyopathy ([place])   . CHF (congestive heart failure) ([place])   . Complication of anesthesia    Hx of seizure when put to  sleep for surgery on 08/2009  . Constipation   . Coronary artery disease   . Diabetes mellitus without complication ([place])   . H/O hiatal hernia   . Hyperlipidemia   . Hypertension   . Peripheral neuropathy   . Seizures ([place])   . Urinary incontinence     Past Surgical History:  Procedure Laterality Date  . CATARACT EXTRACTION, BILATERAL    . CORONARY ANGIOPLASTY     stents x 2  . CORONARY STENT PLACEMENT     2004  . PROSTATE SURGERY  2008   SEED IMPLANTS  . RADIOACTIVE SEED IMPLANT     prostate  . TONSILLECTOMY     1940's    Social History:  reports that he has never smoked. He has never used smokeless tobacco. He reports current alcohol use. He reports that he does not use drugs.  Family History:  Family History  Problem Relation Age of Onset  . Diabetes Mother   . Heart disease Father   . Heart disease Brother   . Diabetes Brother      Prior to Admission medications   Medication Sig Start Date End Date Taking? Authorizing Provider  ALPRAZolam (XANAX) 0.25 MG tablet Take 0.25 mg by mouth 2 (two) times daily as needed for anxiety.   Yes [provider]  aspirin 81 MG tablet Take 81 mg by mouth daily.    Yes [provider]  finasteride (PROSCAR) 5 MG tablet Take  5 mg by mouth daily.  01/09/13  Yes [provider]  insulin lispro (HUMALOG) 100 UNIT/ML KwikPen Inject 11 Units into the skin 2 (two) times a day. 06/02/18  Yes [provider]  latanoprost (XALATAN) 0.005 % ophthalmic solution Place 1 drop into both eyes at bedtime.   Yes [provider]  levETIRAcetam (KEPPRA) 500 MG tablet Take 1 tablet (500 mg total) by mouth 2 (two) times daily. 01/24/18  Yes Black, Lezlie Octave, NP  magnesium oxide (MAG-OX) 400 MG tablet Take 400 mg by mouth daily.   Yes [provider]  Multiple Vitamin (MULTIVITAMIN) tablet Take 1 tablet by mouth every evening.    Yes [provider]  rosuvastatin (CRESTOR) 20 MG tablet Take 10 mg  by mouth every evening.    Yes [provider]  TOUJEO SOLOSTAR 300 UNIT/ML SOPN Inject 18 Units into the skin at bedtime. 06/02/18  Yes [provider]  vitamin C (ASCORBIC ACID) 500 MG tablet Take 500 mg by mouth every evening.    Yes [provider]    Physical Exam: Vitals:   10/12/18 0100 10/12/18 0130 10/12/18 0202 10/12/18 0453  BP: 134/67 140/64 (!) 128/49 129/69  Pulse: 66 61 65 67  Resp: 20 17 18 18   Temp:   97.7 F (36.5 C) 97.8 F (36.6 C)  TempSrc:   Oral Oral  SpO2: 99% 98% 99% 98%  Weight:      Height:       General: Not in acute distress HEENT:       Eyes: PERRL, EOMI, no scleral icterus.       ENT: No discharge from the ears and nose, no pharynx injection, no tonsillar enlargement.        Neck: No JVD, no bruit, no mass felt. Heme: No neck lymph node enlargement. Cardiac: S1/S2, RRR, No murmurs, No gallops or rubs. Respiratory: No rales, wheezing, rhonchi or rubs. GI: Soft, nondistended, nontender, no rebound pain, no organomegaly, BS present. GU: No hematuria Ext: has trace leg edema bilaterally. 2+DP/PT pulse bilaterally. Musculoskeletal: No joint deformities, No joint redness or warmth, no limitation of ROM in spin. Skin: No rashes.  Neuro: Confused, knows his own name, knows that he is in hospital, but not oriented X3, cranial nerves II-XII grossly intact, moves all extremities. Psych: Patient is not psychotic, no suicidal or hemocidal ideation.  Labs on Admission: I have personally reviewed following labs and imaging studies  CBC: Recent Labs  Lab 10/11/18 1810 10/12/18 0222  WBC 7.1 7.3  HGB 12.5* 12.5*  HCT 37.0* 37.7*  MCV 97.6 97.4  PLT 124* 564*   Basic Metabolic Panel: Recent Labs  Lab 10/11/18 1810 10/12/18 0222  NA 132* 138  K 4.0 4.9  CL 107 111  CO2 18* 22  GLUCOSE 163* 103*  BUN 22 18  CREATININE 1.41* 1.33*  CALCIUM 8.2* 8.6*   GFR: Estimated Creatinine Clearance: 45.9 mL/min (A) (by C-G formula  based on SCr of 1.33 mg/dL (H)). Liver Function Tests: Recent Labs  Lab 10/11/18 1810  AST 75*  ALT 42  ALKPHOS 92  BILITOT 0.8  PROT 5.3*  ALBUMIN 2.6*   No results for input(s): LIPASE, AMYLASE in the last 168 hours. Recent Labs  Lab 10/12/18 0222  AMMONIA 29   Coagulation Profile: No results for input(s): INR, PROTIME in the last 168 hours. Cardiac Enzymes: No results for input(s): CKTOTAL, CKMB, CKMBINDEX, TROPONINI in the last 168 hours. BNP (last 3 results) No results  for input(s): PROBNP in the last 8760 hours. HbA1C: No results for input(s): HGBA1C in the last 72 hours. CBG: Recent Labs  Lab 10/11/18 2013 10/12/18 0111  GLUCAP 125* 88   Lipid Profile: No results for input(s): CHOL, HDL, LDLCALC, TRIG, CHOLHDL, LDLDIRECT in the last 72 hours. Thyroid Function Tests: No results for input(s): TSH, T4TOTAL, FREET4, T3FREE, THYROIDAB in the last 72 hours. Anemia Panel: No results for input(s): VITAMINB12, FOLATE, FERRITIN, TIBC, IRON, RETICCTPCT in the last 72 hours. Urine analysis:    Component Value Date/Time   COLORURINE YELLOW 10/12/2018 0103   APPEARANCEUR CLEAR 10/12/2018 0103   LABSPEC 1.020 10/12/2018 0103   PHURINE 7.0 10/12/2018 0103   GLUCOSEU NEGATIVE 10/12/2018 0103   HGBUR NEGATIVE 10/12/2018 0103   BILIRUBINUR NEGATIVE 10/12/2018 0103   KETONESUR NEGATIVE 10/12/2018 0103   PROTEINUR 100 (A) 10/12/2018 0103   UROBILINOGEN 1.0 04/17/2013 1014   NITRITE NEGATIVE 10/12/2018 0103   LEUKOCYTESUR NEGATIVE 10/12/2018 0103   Sepsis Labs: @LABRCNTIP (procalcitonin:4,lacticidven:4) ) Recent Results (from the past 240 hour(s))  SARS Coronavirus 2 (CEPHEID - Performed in [place] hospital lab), Hosp Order     Status: None   Collection Time: 10/11/18 11:49 PM   Specimen: Nasopharyngeal Swab  Result Value Ref Range Status   SARS Coronavirus 2 NEGATIVE NEGATIVE Final    Comment: (NOTE) If result is NEGATIVE SARS-CoV-2 target nucleic acids are NOT  DETECTED. The SARS-CoV-2 RNA is generally detectable in upper and lower  respiratory specimens during the acute phase of infection. The lowest  concentration of SARS-CoV-2 viral copies this assay can detect is 250  copies / mL. A negative result does not preclude SARS-CoV-2 infection  and should not be used as the sole basis for treatment or other  patient management decisions.  A negative result may occur with  improper specimen collection / handling, submission of specimen other  than nasopharyngeal swab, presence of viral mutation(s) within the  areas targeted by this assay, and inadequate number of viral copies  (<250 copies / mL). A negative result must be combined with clinical  observations, patient history, and epidemiological information. If result is POSITIVE SARS-CoV-2 target nucleic acids are DETECTED. The SARS-CoV-2 RNA is generally detectable in upper and lower  respiratory specimens dur ing the acute phase of infection.  Positive  results are indicative of active infection with SARS-CoV-2.  Clinical  correlation with patient history and other diagnostic information is  necessary to determine patient infection status.  Positive results do  not rule out bacterial infection or co-infection with other viruses. If result is PRESUMPTIVE POSTIVE SARS-CoV-2 nucleic acids MAY BE PRESENT.   A presumptive positive result was obtained on the submitted specimen  and confirmed on repeat testing.  While 2019 novel coronavirus  (SARS-CoV-2) nucleic acids may be present in the submitted sample  additional confirmatory testing may be necessary for epidemiological  and / or clinical management purposes  to differentiate between  SARS-CoV-2 and other Sarbecovirus currently known to infect humans.  If clinically indicated additional testing with an alternate test  methodology [phone]) is advised. The SARS-CoV-2 RNA is generally  detectable in upper and lower respiratory sp ecimens during  the acute  phase of infection. The expected result is Negative. Fact Sheet for Patients:  StrictlyIdeas.no Fact Sheet for Healthcare Providers: BankingDealers.co.za This test is not yet approved or cleared by the [place] FDA and has been authorized for detection and/or diagnosis of SARS-CoV-2 by FDA under an Emergency Use Authorization (EUA).  This EUA will remain in effect (meaning this test can be used) for the duration of the COVID-19 declaration under Section 564(b)(1) of the Act, 21 U.S.C. section 360bbb-3(b)(1), unless the authorization is terminated or revoked sooner. Performed at [facility], [place] [address], [place], [place] [address]      Radiological Exams on Admission: Ct Head Wo Contrast  Result Date: 10/11/2018 CLINICAL DATA:  Altered level of consciousness (LOC), unexplained. Increasing lethargy and generalized weakness. EXAM: CT HEAD WITHOUT CONTRAST TECHNIQUE: Contiguous axial images were obtained from the base of the skull through the vertex without intravenous contrast. COMPARISON:  Head CT 01/23/2018 FINDINGS: Brain: No evidence of acute infarction, hemorrhage, hydrocephalus, extra-axial collection or mass lesion/mass effect. Advanced atrophy, unchanged from prior exam. Moderate chronic small vessel ischemia. Vascular: Atherosclerosis of skullbase vasculature without hyperdense vessel or abnormal calcification. Skull: No fracture or focal lesion. Sinuses/Orbits: Chronic fluid level in mucosal thickening in left side of sphenoid sinus. Chronic mucosal thickening of the right maxillary sinus and ethmoid air cells. Other: None. IMPRESSION: No acute intracranial abnormality. Stable atrophy and chronic small vessel ischemia. Electronically Signed   By: Keith Rake M.D.   On: 10/11/2018 21:34   Mr Brain Wo Contrast (neuro Protocol)  Result Date: 10/12/2018 CLINICAL DATA:  Altered level of consciousness EXAM: MRI  HEAD WITHOUT CONTRAST TECHNIQUE: Multiplanar, multiecho pulse sequences of the brain and surrounding structures were obtained without intravenous contrast. COMPARISON:  CT from yesterday FINDINGS: Brain: No acute infarction, hemorrhage, hydrocephalus, extra-axial collection or mass lesion. Advanced generalized brain atrophy. Periventricular chronic small vessel ischemic change, mild for age. Small remote right cerebellar infarcts. Vascular: Major flow voids are preserved Skull and upper cervical spine: Negative for marrow lesion Sinuses/Orbits: Bilateral cataract resection and left scleral banding. Incidental mucosal thickening in the left sphenoid and right maxillary sinuses with right maxillary sinus atelectasis. IMPRESSION: 1. No acute or reversible finding. 2. Advanced brain atrophy. Electronically Signed   By: Monte Fantasia M.D.   On: 10/12/2018 04:49   Dg Chest Port 1 View  Result Date: 10/12/2018 CLINICAL DATA:  MRI clearance. EXAM: PORTABLE CHEST 1 VIEW COMPARISON:  Chest radiograph dated 01/23/2018 FINDINGS: Shallow inspiration with bibasilar atelectasis. No new consolidative changes. There is no pleural effusion or pneumothorax. Stable cardiomegaly. Atherosclerotic calcification of the aortic arch. Several tiny linear radiopaque densities over the left hemidiaphragm, likely surgical clips. No other radiopaque foreign object identified. IMPRESSION: No acute findings.  No interval change. Electronically Signed   By: Anner Crete M.D.   On: 10/12/2018 00:43     EKG: Independently reviewed.  Sinus rhythm, QTC 482, low voltage, LAD, poor R wave progression, questionable ST elevation in precordial leads  Assessment/Plan Principal Problem:   Acute metabolic encephalopathy Active Problems:   Alcohol dependence (HCC)   Type II diabetes mellitus with renal manifestations (HCC)   Coronary artery disease   Seizure (HCC)   CKD (chronic kidney disease), stage III (HCC)   Gait instability   HLD  (hyperlipidemia)   Acute metabolic encephalopathy and Gait instability: Etiology is not clear.  CT head is negative.  MRI of the brain is negative.  Urinalysis negative. -Placed on telemetry bed for observation -PT/OT -Frequent neuro check - Check ammonia level  Alcohol dependence ([place]): -CiWA  Type II diabetes mellitus with renal manifestations ([place]): Last A1c 6.0 on 07/19/17, well controled. Patient is taking Humalog and Toujeo at home -SSI  Coronary artery disease: s/p of stent.  EKG showed questionable ST elevation, but  patient does not have chest pain or shortness of breath. -Continue aspirin, Crestor -trend troponin just in case  Addendum: trop is elevated at 52. No CP. Pt has elevated BNP 509, indicating possible dCHF.  Patient had 2D echo on 06/09/2017 showed EF of 60-65%. Pt may have demand ischemia. - Trend Trop - Repeat EKG in the am  - aspirin, Crestor - Risk factor stratification: will check FLP and A1C  -2d echo - start lasix 20 mg daily - inpt card consult was requested via Epic  Seizure -Seizure precaution -When necessary Ativan for seizure -Continue Home medications: Keppra  CKD (chronic kidney disease), stage III ([place]): stable.  Baseline creatinine 1.0-1.4.  His creatinine is 1.41, BUN 22. - Follow-up by BMP  HLD: -Cestor     DVT ppx: SQ Lovenox Code Status: DNR per his wife Family Communication: Yes, patient's wife by phone    Disposition Plan:  Anticipate discharge back to previous home environment Consults called:  none Admission status: Obs / tele     Date of Service 10/12/2018    [facility]   If 7PM-7AM, please contact night-coverage [email or web address] Password [facility] 10/12/2018, 5:21 AM

## 2018-10-12 NOTE — Progress Notes (Addendum)
PROGRESS NOTE    Daniel [name]  VQM:[id number] DOB: 23-Aug-1933 DOA: 10/11/2018 PCP: Jani Gravel, MD   Brief Narrative:  HPI on 10/12/2018 by Dr. Ivor Costa Daniel [name] is a 85 y.o. male with medical history significant of hypertension, hyperlipidemia, diabetes mellitus, alcohol abuse, seizure, CAD, stent placement, prostate cancer (radiation seeds placement), CKD stage III, who presents with generalized weakness, gait instability and AMS.  Per his wife (I called his wife by phone),  Pt has been having generalized weakness and gait instability in the past several days.  His wife is very concerned that the patient may fall, but fortunately patient did not fall. The patient normally uses a walker, but was unable to ambulate with this. Wife states that pt normally can tell you the month and year, but is not able to do so today. The patient does not always know the day.  Patient does not have facial droop or slurred speech.  Patient does not have any chest pain, shortness of breath, fever or chills.  He has some mild dry cough.  No nausea vomiting, diarrhea, abdominal pain, symptoms of UTI. Assessment & Plan   Acute metabolic encephalopathy with gait instability -Etiology unclear. Currently AAOx3 -CT head: No acute intercranial normality -MRI brain: No acute or reversible finding -UA and CXR unremarkable for infection -Ammonia level 29, TSH 2.253, vitamin B12 1089 -PT, OT consulted -COVID negative  Alcohol dependence -Continue CIWA protocol  Diabetes mellitus, type II -Continue insulin sliding scale with CBG monitoring  CAD with mildly elevated troponin and BNP -Currently no complaints of chest pain or shortness of breath -Echocardiogram 06/09/2017: EF 60-65% -Continue to trend troponin -BNP 509.1 -Cardiology consulted and appreciated by admitted physician -Continue aspirin, statin -patient started lasix  -Echocardiogram pending  Seizure disorder -Continue precautions  -Continue keppra and ativan PRN  Chronic kidney disease, Stage III -Stable  Hyperlipidemia -Continue statin  DVT Prophylaxis  lovenox  Code Status: DNR  Family Communication: None at bedside.  Disposition Plan: Observation. Pending workup and PT/OT. Suspect home on discharge.  Consultants Cardiology  Procedures  None  Antibiotics   Anti-infectives (From admission, onward)   None      Subjective:   Daniel [name] seen and examined today.  Patient has no complaints this morning.  Wants to go home.  Denies current chest pain, shortness breath, abdominal pain, nausea vomiting, diarrhea constipation, dizziness or headache.  Objective:   Vitals:   10/12/18 0100 10/12/18 0130 10/12/18 0202 10/12/18 0453  BP: 134/67 140/64 (!) 128/49 129/69  Pulse: 66 61 65 67  Resp: 20 17 18 18   Temp:   97.7 F (36.5 C) 97.8 F (36.6 C)  TempSrc:   Oral Oral  SpO2: 99% 98% 99% 98%  Weight:      Height:        Intake/Output Summary (Last 24 hours) at 10/12/2018 1131 Last data filed at 10/11/2018 2251 Gross per 24 hour  Intake 450 ml  Output -  Net 450 ml   Filed Weights   10/11/18 1951  Weight: 84.8 kg    Exam  General: Well developed, well nourished, elderly, NAD  HEENT: NCAT, mucous membranes moist.   Neck: Supple  Cardiovascular: S1 S2 auscultated, RRR, no murmur  Respiratory: Clear to auscultation bilaterally with equal chest rise  Abdomen: Soft, nontender, nondistended, + bowel sounds  Extremities: warm dry without cyanosis clubbing. LLE edema >RLE  Neuro: AAOx3, nonfocal  Psych: Normal affect and demeanor    Data Reviewed: I  have personally reviewed following labs and imaging studies  CBC: Recent Labs  Lab 10/11/18 1810 10/12/18 0222  WBC 7.1 7.3  HGB 12.5* 12.5*  HCT 37.0* 37.7*  MCV 97.6 97.4  PLT 124* 628*   Basic Metabolic Panel: Recent Labs  Lab 10/11/18 1810 10/12/18 0222  NA 132* 138  K 4.0 4.9  CL 107 111  CO2 18* 22  GLUCOSE  163* 103*  BUN 22 18  CREATININE 1.41* 1.33*  CALCIUM 8.2* 8.6*   GFR: Estimated Creatinine Clearance: 45.9 mL/min (A) (by C-G formula based on SCr of 1.33 mg/dL (H)). Liver Function Tests: Recent Labs  Lab 10/11/18 1810  AST 75*  ALT 42  ALKPHOS 92  BILITOT 0.8  PROT 5.3*  ALBUMIN 2.6*   No results for input(s): LIPASE, AMYLASE in the last 168 hours. Recent Labs  Lab 10/12/18 0222  AMMONIA 29   Coagulation Profile: No results for input(s): INR, PROTIME in the last 168 hours. Cardiac Enzymes: No results for input(s): CKTOTAL, CKMB, CKMBINDEX, TROPONINI in the last 168 hours. BNP (last 3 results) No results for input(s): PROBNP in the last 8760 hours. HbA1C: No results for input(s): HGBA1C in the last 72 hours. CBG: Recent Labs  Lab 10/11/18 2013 10/12/18 0111  GLUCAP 125* 88   Lipid Profile: No results for input(s): CHOL, HDL, LDLCALC, TRIG, CHOLHDL, LDLDIRECT in the last 72 hours. Thyroid Function Tests: Recent Labs    10/12/18 0726  TSH 2.253   Anemia Panel: Recent Labs    10/12/18 0726  VITAMINB12 1,089*   Urine analysis:    Component Value Date/Time   COLORURINE YELLOW 10/12/2018 0103   APPEARANCEUR CLEAR 10/12/2018 0103   LABSPEC 1.020 10/12/2018 0103   PHURINE 7.0 10/12/2018 0103   GLUCOSEU NEGATIVE 10/12/2018 0103   HGBUR NEGATIVE 10/12/2018 0103   BILIRUBINUR NEGATIVE 10/12/2018 0103   KETONESUR NEGATIVE 10/12/2018 0103   PROTEINUR 100 (A) 10/12/2018 0103   UROBILINOGEN 1.0 04/17/2013 1014   NITRITE NEGATIVE 10/12/2018 0103   LEUKOCYTESUR NEGATIVE 10/12/2018 0103   Sepsis Labs: @LABRCNTIP (procalcitonin:4,lacticidven:4)  ) Recent Results (from the past 240 hour(s))  SARS Coronavirus 2 (CEPHEID - Performed in [facility] hospital lab), Hosp Order     Status: None   Collection Time: 10/11/18 11:49 PM   Specimen: Nasopharyngeal Swab  Result Value Ref Range Status   SARS Coronavirus 2 NEGATIVE NEGATIVE Final    Comment: (NOTE) If  result is NEGATIVE SARS-CoV-2 target nucleic acids are NOT DETECTED. The SARS-CoV-2 RNA is generally detectable in upper and lower  respiratory specimens during the acute phase of infection. The lowest  concentration of SARS-CoV-2 viral copies this assay can detect is 250  copies / mL. A negative result does not preclude SARS-CoV-2 infection  and should not be used as the sole basis for treatment or other  patient management decisions.  A negative result may occur with  improper specimen collection / handling, submission of specimen other  than nasopharyngeal swab, presence of viral mutation(s) within the  areas targeted by this assay, and inadequate number of viral copies  (<250 copies / mL). A negative result must be combined with clinical  observations, patient history, and epidemiological information. If result is POSITIVE SARS-CoV-2 target nucleic acids are DETECTED. The SARS-CoV-2 RNA is generally detectable in upper and lower  respiratory specimens dur ing the acute phase of infection.  Positive  results are indicative of active infection with SARS-CoV-2.  Clinical  correlation with patient history and other diagnostic information  is  necessary to determine patient infection status.  Positive results do  not rule out bacterial infection or co-infection with other viruses. If result is PRESUMPTIVE POSTIVE SARS-CoV-2 nucleic acids MAY BE PRESENT.   A presumptive positive result was obtained on the submitted specimen  and confirmed on repeat testing.  While 2019 novel coronavirus  (SARS-CoV-2) nucleic acids may be present in the submitted sample  additional confirmatory testing may be necessary for epidemiological  and / or clinical management purposes  to differentiate between  SARS-CoV-2 and other Sarbecovirus currently known to infect humans.  If clinically indicated additional testing with an alternate test  methodology [phone]) is advised. The SARS-CoV-2 RNA is generally   detectable in upper and lower respiratory sp ecimens during the acute  phase of infection. The expected result is Negative. Fact Sheet for Patients:  StrictlyIdeas.no Fact Sheet for Healthcare Providers: BankingDealers.co.za This test is not yet approved or cleared by the [place] FDA and has been authorized for detection and/or diagnosis of SARS-CoV-2 by FDA under an Emergency Use Authorization (EUA).  This EUA will remain in effect (meaning this test can be used) for the duration of the COVID-19 declaration under Section 564(b)(1) of the Act, 21 U.S.C. section 360bbb-3(b)(1), unless the authorization is terminated or revoked sooner. Performed at [facility], [place] [address] [place], [place] [address]       Radiology Studies: Ct Head Wo Contrast  Result Date: 10/11/2018 CLINICAL DATA:  Altered level of consciousness (LOC), unexplained. Increasing lethargy and generalized weakness. EXAM: CT HEAD WITHOUT CONTRAST TECHNIQUE: Contiguous axial images were obtained from the base of the skull through the vertex without intravenous contrast. COMPARISON:  Head CT 01/23/2018 FINDINGS: Brain: No evidence of acute infarction, hemorrhage, hydrocephalus, extra-axial collection or mass lesion/mass effect. Advanced atrophy, unchanged from prior exam. Moderate chronic small vessel ischemia. Vascular: Atherosclerosis of skullbase vasculature without hyperdense vessel or abnormal calcification. Skull: No fracture or focal lesion. Sinuses/Orbits: Chronic fluid level in mucosal thickening in left side of sphenoid sinus. Chronic mucosal thickening of the right maxillary sinus and ethmoid air cells. Other: None. IMPRESSION: No acute intracranial abnormality. Stable atrophy and chronic small vessel ischemia. Electronically Signed   By: Keith Rake M.D.   On: 10/11/2018 21:34   Mr Brain Wo Contrast (neuro Protocol)  Result Date: 10/12/2018 CLINICAL  DATA:  Altered level of consciousness EXAM: MRI HEAD WITHOUT CONTRAST TECHNIQUE: Multiplanar, multiecho pulse sequences of the brain and surrounding structures were obtained without intravenous contrast. COMPARISON:  CT from yesterday FINDINGS: Brain: No acute infarction, hemorrhage, hydrocephalus, extra-axial collection or mass lesion. Advanced generalized brain atrophy. Periventricular chronic small vessel ischemic change, mild for age. Small remote right cerebellar infarcts. Vascular: Major flow voids are preserved Skull and upper cervical spine: Negative for marrow lesion Sinuses/Orbits: Bilateral cataract resection and left scleral banding. Incidental mucosal thickening in the left sphenoid and right maxillary sinuses with right maxillary sinus atelectasis. IMPRESSION: 1. No acute or reversible finding. 2. Advanced brain atrophy. Electronically Signed   By: Monte Fantasia M.D.   On: 10/12/2018 04:49   Dg Chest Port 1 View  Result Date: 10/12/2018 CLINICAL DATA:  MRI clearance. EXAM: PORTABLE CHEST 1 VIEW COMPARISON:  Chest radiograph dated 01/23/2018 FINDINGS: Shallow inspiration with bibasilar atelectasis. No new consolidative changes. There is no pleural effusion or pneumothorax. Stable cardiomegaly. Atherosclerotic calcification of the aortic arch. Several tiny linear radiopaque densities over the left hemidiaphragm, likely surgical clips. No other radiopaque foreign object identified. IMPRESSION: No  acute findings.  No interval change. Electronically Signed   By: Anner Crete M.D.   On: 10/12/2018 00:43     Scheduled Meds: . aspirin EC  81 mg Oral Daily  . enoxaparin (LOVENOX) injection  40 mg Subcutaneous Daily  . finasteride  5 mg Oral Daily  . folic acid  1 mg Oral Daily  . furosemide  20 mg Intravenous Daily  . insulin aspart  0-5 Units Subcutaneous QHS  . insulin aspart  0-9 Units Subcutaneous TID WC  . latanoprost  1 drop Both Eyes QHS  . levETIRAcetam  500 mg Oral BID  .  LORazepam  0-4 mg Intravenous BID   Followed by  . [START ON 10/13/2018] LORazepam  0-4 mg Intravenous Q12H  . multivitamin with minerals  1 tablet Oral Daily  . rosuvastatin  10 mg Oral QPM  . sodium chloride flush  3 mL Intravenous Once  . thiamine  100 mg Oral Daily   Or  . thiamine  100 mg Intravenous Daily  . vitamin C  500 mg Oral QPM   Continuous Infusions:   LOS: 0 days   Time Spent in minutes   30 minutes  Yolando Gillum D.O. on 10/12/2018 at 11:31 AM  Between 7am to 7pm - Please see pager noted on [email or web address]  After 7pm go to [email or web address]  And look for the night coverage person covering for me after hours  [facility] Office  [phone]

## 2018-10-12 NOTE — ED Notes (Signed)
Pt brief saturated on assessment. I & O cath completed per order. 700 cc clear yellow urine output. Condom cath placed.

## 2018-10-12 NOTE — Evaluation (Signed)
Physical Therapy Evaluation Patient Details Name: Daniel [name] MRN: [id number] DOB: December 16, 1933 Today's Date: 10/12/2018   History of Present Illness  85 y.o. male with medical history significant of hypertension, hyperlipidemia, diabetes mellitus, alcohol abuse, seizure, CAD, stent placement, prostate cancer (radiation seeds placement), CKD stage III, who presents with generalized weakness, gait instability and AMS.    Clinical Impression  Patient admitted with the above. Patient with some confusion throughout session - unable to state date or situation. Unsure of true PLOF due to confusion. Patient requiring Min A to come to EOB with Mod A with RW for sit to stand and pivot to recliner. Noted posterior lean with standing with cueing for posturing. Mod A to maintain upright standing balance during transfer. Due to current functional status will recommend SNF at discharge. PT to continue to follow to progress safe mobility.     Follow Up Recommendations SNF    Equipment Recommendations  None recommended by PT    Recommendations for Other Services       Precautions / Restrictions Precautions Precautions: Fall Restrictions Weight Bearing Restrictions: No      Mobility  Bed Mobility Overal bed mobility: Needs Assistance Bed Mobility: Supine to Sit Rolling: Min assist   Supine to sit: Min assist     General bed mobility comments: Min A to come to EOB  Transfers Overall transfer level: Needs assistance Equipment used: Rolling walker (2 wheeled) Transfers: Sit to/from [facility] Sit to Stand: Mod assist Stand pivot transfers: Mod assist       General transfer comment: Mod A to stand at bedside with heavy posterior lean; Mod A for pivotal and side steps to recliner with RW - poor balance  Ambulation/Gait             General Gait Details: only taking a few pivot and side steps towards recliner - Mod A to maintain balance  Stairs             Wheelchair Mobility    Modified Rankin (Stroke Patients Only)       Balance Overall balance assessment: Needs assistance Sitting-balance support: No upper extremity supported;Feet supported Sitting balance-Leahy Scale: Fair     Standing balance support: Bilateral upper extremity supported;During functional activity Standing balance-Leahy Scale: Poor                               Pertinent Vitals/Pain Pain Assessment: No/denies pain    Home Living Family/patient expects to be discharged to:: Private residence Living Arrangements: Spouse/significant other Available Help at Discharge: Family;Available 24 hours/day Type of Home: House Home Access: Stairs to enter   [employer] of Steps: pt with inconsistent response Home Layout: Two level Home Equipment: Clinical cytogeneticist - 2 wheels      Prior Function Level of Independence: Needs assistance   Gait / Transfers Assistance Needed: Used RW for mobility  ADL's / Homemaking Assistance Needed: per previous admission - wife assists pt in shower        Hand Dominance   Dominant Hand: Right    Extremity/Trunk Assessment   Upper Extremity Assessment Upper Extremity Assessment: Defer to OT evaluation    Lower Extremity Assessment Lower Extremity Assessment: Generalized weakness    Cervical / Trunk Assessment Cervical / Trunk Assessment: Normal  Communication   Communication: No difficulties  Cognition Arousal/Alertness: Awake/alert Behavior During Therapy: WFL for tasks assessed/performed Overall Cognitive Status: Impaired/Different from baseline Area of Impairment:  Orientation;Following commands;Safety/judgement;Problem solving;Memory                 Orientation Level: Disoriented to;Time;Situation   Memory: Decreased short-term memory Following Commands: Follows one step commands with increased time;Follows one step commands inconsistently Safety/Judgement: Decreased awareness  of safety;Decreased awareness of deficits   Problem Solving: Slow processing;Decreased initiation;Difficulty sequencing;Requires verbal cues;Requires tactile cues        General Comments      Exercises     Assessment/Plan    PT Assessment Patient needs continued PT services  PT Problem List Decreased strength;Decreased activity tolerance;Decreased balance;Decreased mobility;Decreased knowledge of use of DME;Decreased safety awareness       PT Treatment Interventions DME instruction;Gait training;Stair training;Functional mobility training;Therapeutic activities;Therapeutic exercise;Balance training;Patient/family education    PT Goals (Current goals can be found in the Care Plan section)  Acute Rehab PT Goals Patient Stated Goal: to go home PT Goal Formulation: With patient Time For Goal Achievement: 10/26/18 Potential to Achieve Goals: Good    Frequency Min 3X/week   Barriers to discharge        Co-evaluation               AM-PAC PT "6 Clicks" Mobility  Outcome Measure Help needed turning from your back to your side while in a flat bed without using bedrails?: A Little Help needed moving from lying on your back to sitting on the side of a flat bed without using bedrails?: A Little Help needed moving to and from a bed to a chair (including a wheelchair)?: A Lot Help needed standing up from a chair using your arms (e.g., wheelchair or bedside chair)?: A Lot Help needed to walk in hospital room?: A Lot Help needed climbing 3-5 steps with a railing? : Total 6 Click Score: 13    End of Session Equipment Utilized During Treatment: Gait belt Activity Tolerance: Patient tolerated treatment well Patient left: in chair;with call bell/phone within reach;with chair alarm set Nurse Communication: Mobility status PT Visit Diagnosis: Unsteadiness on feet (R26.81);Other abnormalities of gait and mobility (R26.89);Muscle weakness (generalized) (M62.81)    Time:  8270-7867 PT Time Calculation (min) (ACUTE ONLY): 15 min   Charges:   PT Evaluation $PT Eval Moderate Complexity: 1 Mod          Lanney Gins, PT, DPT Supplemental Physical Therapist 10/12/18 3:26 PM Pager: [phone] Office: [phone]

## 2018-10-12 NOTE — ED Notes (Signed)
ED TO INPATIENT HANDOFF REPORT  ED Nurse Name and Phone #: [phone]  S Name/Age/Gender Daniel [name] 85 y.o. male Room/Bed: 019C/019C  Code Status   Code Status: DNR  Home/SNF/Other Home Patient oriented to: self Is this baseline? Yes   Triage Complete: Triage complete  Chief Complaint altered   Triage Note Pt arrivess via EMS from home with wife for increased lethargy/ generalized weakness x2 weeks. Pt is alert to self, place, event but not date. Usually is a/ox4. Pt is normally ambulatory with walker, but is not able to walk today. EMS reports pt has some lower leg edema and some abd distention. 97% on room air, BP 116/48. Pt is able to follow commands.    Allergies No Known Allergies  Level of Care/Admitting Diagnosis ED Disposition    ED Disposition Condition Comment   Admit  Hospital Area: [facility] [[id number]]  Level of Care: Telemetry Medical [104]  I expect the patient will be discharged within 24 hours: No (not a candidate for 5C-Observation unit)  Covid Evaluation: Asymptomatic Screening Protocol (No Symptoms)  Diagnosis: Acute metabolic encephalopathy [8315176]  Admitting Physician: Ivor Costa [[id number]]  Attending Physician: Ivor Costa [[id number]]  PT Class (Do Not Modify): Observation [104]  PT Acc Code (Do Not Modify): Observation [10022]       B Medical/Surgery History Past Medical History:  Diagnosis Date  . Alcohol abuse   . Cancer [facility])    prostate  . Cardiomyopathy ([place])   . CHF (congestive heart failure) ([place])   . Complication of anesthesia    Hx of seizure when put to sleep for surgery on 08/2009  . Constipation   . Coronary artery disease   . Diabetes mellitus without complication ([place])   . H/O hiatal hernia   . Hyperlipidemia   . Hypertension   . Peripheral neuropathy   . Seizures ([place])   . Urinary incontinence    Past Surgical History:  Procedure Laterality Date  . CATARACT EXTRACTION, BILATERAL    . CORONARY  ANGIOPLASTY     stents x 2  . CORONARY STENT PLACEMENT     2004  . PROSTATE SURGERY  2008   SEED IMPLANTS  . RADIOACTIVE SEED IMPLANT     prostate  . TONSILLECTOMY     1940's     A IV Location/Drains/Wounds Patient Lines/Drains/Airways Status   Active Line/Drains/Airways    Name:   Placement date:   Placement time:   Site:   Days:   Peripheral IV 10/11/18 Right Hand   10/11/18    -    Hand   1   Peripheral IV 10/11/18 Left;Upper Arm   10/11/18    2128    Arm   1          Intake/Output Last 24 hours  Intake/Output Summary (Last 24 hours) at 10/12/2018 0113 Last data filed at 10/11/2018 2251 Gross per 24 hour  Intake 450 ml  Output -  Net 450 ml    Labs/Imaging Results for orders placed or performed during the hospital encounter of 10/11/18 (from the past 48 hour(s))  Comprehensive metabolic panel     Status: Abnormal   Collection Time: 10/11/18  6:10 PM  Result Value Ref Range   Sodium 132 (L) 135 - 145 mmol/L   Potassium 4.0 3.5 - 5.1 mmol/L   Chloride 107 98 - 111 mmol/L   CO2 18 (L) 22 - 32 mmol/L   Glucose, Bld 163 (H) 70 - 99 mg/dL  BUN 22 8 - 23 mg/dL   Creatinine, Ser 1.41 (H) 0.61 - 1.24 mg/dL   Calcium 8.2 (L) 8.9 - 10.3 mg/dL   Total Protein 5.3 (L) 6.5 - 8.1 g/dL   Albumin 2.6 (L) 3.5 - 5.0 g/dL   AST 75 (H) 15 - 41 U/L   ALT 42 0 - 44 U/L   Alkaline Phosphatase 92 38 - 126 U/L   Total Bilirubin 0.8 0.3 - 1.2 mg/dL   GFR calc non Af Amer 46 (L) >60 mL/min   GFR calc Af Amer 53 (L) >60 mL/min   Anion gap 7 5 - 15    Comment: Performed at [facility] [address] [place], [place] [address]  CBC     Status: Abnormal   Collection Time: 10/11/18  6:10 PM  Result Value Ref Range   WBC 7.1 4.0 - 10.5 K/uL   RBC 3.79 (L) 4.22 - 5.81 MIL/uL   Hemoglobin 12.5 (L) 13.0 - 17.0 g/dL   HCT 37.0 (L) 39.0 - 52.0 %   MCV 97.6 80.0 - 100.0 fL   MCH 33.0 26.0 - 34.0 pg   MCHC 33.8 30.0 - 36.0 g/dL   RDW 12.9 11.5 - 15.5 %   Platelets 124 (L) 150 -  400 K/uL    Comment: REPEATED TO VERIFY   nRBC 0.0 0.0 - 0.2 %    Comment: Performed at [facility], [place] [address], [place], [place] [address]  CBG monitoring, ED     Status: Abnormal   Collection Time: 10/11/18  8:13 PM  Result Value Ref Range   Glucose-Capillary 125 (H) 70 - 99 mg/dL   Comment 1 Document in Chart   Lactic acid, plasma     Status: None   Collection Time: 10/11/18 10:07 PM  Result Value Ref Range   Lactic Acid, Venous 1.1 0.5 - 1.9 mmol/L    Comment: Performed at [facility] [address], [place], [place] [address]  SARS Coronavirus 2 (CEPHEID - Performed in [facility] hospital lab), Hosp Order     Status: None   Collection Time: 10/11/18 11:49 PM   Specimen: Nasopharyngeal Swab  Result Value Ref Range   SARS Coronavirus 2 NEGATIVE NEGATIVE    Comment: (NOTE) If result is NEGATIVE SARS-CoV-2 target nucleic acids are NOT DETECTED. The SARS-CoV-2 RNA is generally detectable in upper and lower  respiratory specimens during the acute phase of infection. The lowest  concentration of SARS-CoV-2 viral copies this assay can detect is 250  copies / mL. A negative result does not preclude SARS-CoV-2 infection  and should not be used as the sole basis for treatment or other  patient management decisions.  A negative result may occur with  improper specimen collection / handling, submission of specimen other  than nasopharyngeal swab, presence of viral mutation(s) within the  areas targeted by this assay, and inadequate number of viral copies  (<250 copies / mL). A negative result must be combined with clinical  observations, patient history, and epidemiological information. If result is POSITIVE SARS-CoV-2 target nucleic acids are DETECTED. The SARS-CoV-2 RNA is generally detectable in upper and lower  respiratory specimens dur ing the acute phase of infection.  Positive  results are indicative of active infection with SARS-CoV-2.  Clinical  correlation  with patient history and other diagnostic information is  necessary to determine patient infection status.  Positive results do  not rule out bacterial infection or co-infection with  other viruses. If result is PRESUMPTIVE POSTIVE SARS-CoV-2 nucleic acids MAY BE PRESENT.   A presumptive positive result was obtained on the submitted specimen  and confirmed on repeat testing.  While 2019 novel coronavirus  (SARS-CoV-2) nucleic acids may be present in the submitted sample  additional confirmatory testing may be necessary for epidemiological  and / or clinical management purposes  to differentiate between  SARS-CoV-2 and other Sarbecovirus currently known to infect humans.  If clinically indicated additional testing with an alternate test  methodology [phone]) is advised. The SARS-CoV-2 RNA is generally  detectable in upper and lower respiratory sp ecimens during the acute  phase of infection. The expected result is Negative. Fact Sheet for Patients:  StrictlyIdeas.no Fact Sheet for Healthcare Providers: BankingDealers.co.za This test is not yet approved or cleared by the [place] FDA and has been authorized for detection and/or diagnosis of SARS-CoV-2 by FDA under an Emergency Use Authorization (EUA).  This EUA will remain in effect (meaning this test can be used) for the duration of the COVID-19 declaration under Section 564(b)(1) of the Act, 21 U.S.C. section 360bbb-3(b)(1), unless the authorization is terminated or revoked sooner. Performed at [facility], [place] [address] [place], [place] [address]   CBG monitoring, ED     Status: None   Collection Time: 10/12/18  1:11 AM  Result Value Ref Range   Glucose-Capillary 88 70 - 99 mg/dL   Ct Head Wo Contrast  Result Date: 10/11/2018 CLINICAL DATA:  Altered level of consciousness (LOC), unexplained. Increasing lethargy and generalized weakness. EXAM: CT HEAD WITHOUT CONTRAST  TECHNIQUE: Contiguous axial images were obtained from the base of the skull through the vertex without intravenous contrast. COMPARISON:  Head CT 01/23/2018 FINDINGS: Brain: No evidence of acute infarction, hemorrhage, hydrocephalus, extra-axial collection or mass lesion/mass effect. Advanced atrophy, unchanged from prior exam. Moderate chronic small vessel ischemia. Vascular: Atherosclerosis of skullbase vasculature without hyperdense vessel or abnormal calcification. Skull: No fracture or focal lesion. Sinuses/Orbits: Chronic fluid level in mucosal thickening in left side of sphenoid sinus. Chronic mucosal thickening of the right maxillary sinus and ethmoid air cells. Other: None. IMPRESSION: No acute intracranial abnormality. Stable atrophy and chronic small vessel ischemia. Electronically Signed   By: Keith Rake M.D.   On: 10/11/2018 21:34   Dg Chest Port 1 View  Result Date: 10/12/2018 CLINICAL DATA:  MRI clearance. EXAM: PORTABLE CHEST 1 VIEW COMPARISON:  Chest radiograph dated 01/23/2018 FINDINGS: Shallow inspiration with bibasilar atelectasis. No new consolidative changes. There is no pleural effusion or pneumothorax. Stable cardiomegaly. Atherosclerotic calcification of the aortic arch. Several tiny linear radiopaque densities over the left hemidiaphragm, likely surgical clips. No other radiopaque foreign object identified. IMPRESSION: No acute findings.  No interval change. Electronically Signed   By: Anner Crete M.D.   On: 10/12/2018 00:43    Pending Labs Unresulted Labs (From admission, onward)    Start     Ordered   10/12/18 3810  Basic metabolic panel  Tomorrow morning,   R     10/12/18 0057   10/12/18 0500  CBC  Tomorrow morning,   R     10/12/18 0057   10/12/18 0057  Brain natriuretic peptide  ONCE - STAT,   STAT     10/12/18 0057   10/12/18 0057  Ammonia  ONCE - STAT,   STAT     10/12/18 0057   10/11/18 2055  Lactic acid, plasma  Now then every 2 hours,   STAT  10/11/18 2054   10/11/18 1802  Urinalysis, Routine w reflex microscopic  Once,   STAT     10/11/18 1801          Vitals/Pain Today's Vitals   10/11/18 2345 10/12/18 0000 10/12/18 0030 10/12/18 0100  BP: (!) 125/57 (!) 128/54 137/66 134/67  Pulse: 63 64 63 66  Resp: 15 17 17 20   Temp:      TempSrc:      SpO2: 98% 97% 98% 99%  Weight:      Height:      PainSc: 0-No pain       Isolation Precautions No active isolations  Medications Medications  sodium chloride flush (NS) 0.9 % injection 3 mL (has no administration in time range)  sodium chloride flush (NS) 0.9 % injection 3 mL (has no administration in time range)  LORazepam (ATIVAN) injection 1 mg (has no administration in time range)  levETIRAcetam (KEPPRA) IVPB 500 mg/100 mL premix (has no administration in time range)  LORazepam (ATIVAN) tablet 1 mg (has no administration in time range)    Or  LORazepam (ATIVAN) injection 1 mg (has no administration in time range)  thiamine (VITAMIN B-1) tablet 100 mg (has no administration in time range)    Or  thiamine (B-1) injection 100 mg (has no administration in time range)  folic acid (FOLVITE) tablet 1 mg (has no administration in time range)  multivitamin with minerals tablet 1 tablet (has no administration in time range)  LORazepam (ATIVAN) injection 0-4 mg (has no administration in time range)    Followed by  LORazepam (ATIVAN) injection 0-4 mg (has no administration in time range)  insulin aspart (novoLOG) injection 0-9 Units (has no administration in time range)  insulin aspart (novoLOG) injection 0-5 Units (0 Units Subcutaneous Not Given 10/12/18 0112)  enoxaparin (LOVENOX) injection 40 mg (has no administration in time range)  acetaminophen (TYLENOL) tablet 650 mg (has no administration in time range)    Or  acetaminophen (TYLENOL) suppository 650 mg (has no administration in time range)  ondansetron (ZOFRAN) tablet 4 mg (has no administration in time range)    Or   ondansetron (ZOFRAN) injection 4 mg (has no administration in time range)  hydrALAZINE (APRESOLINE) injection 5 mg (has no administration in time range)  aspirin EC tablet 81 mg (has no administration in time range)  rosuvastatin (CRESTOR) tablet 10 mg (has no administration in time range)  finasteride (PROSCAR) tablet 5 mg (has no administration in time range)  vitamin C (ASCORBIC ACID) tablet 500 mg (has no administration in time range)  latanoprost (XALATAN) 0.005 % ophthalmic solution 1 drop (has no administration in time range)  sodium chloride 0.9 % bolus 500 mL (0 mLs Intravenous Stopped 10/11/18 2251)    Mobility walks with device High fall risk   Focused Assessments pt is oreinted only to self. cooperative, condom cath   R Recommendations: See Admitting Provider Note  Report given to:   Additional Notes:

## 2018-10-13 DIAGNOSIS — Z794 Long term (current) use of insulin: Secondary | ICD-10-CM | POA: Diagnosis not present

## 2018-10-13 DIAGNOSIS — Z7982 Long term (current) use of aspirin: Secondary | ICD-10-CM | POA: Diagnosis not present

## 2018-10-13 DIAGNOSIS — E785 Hyperlipidemia, unspecified: Secondary | ICD-10-CM | POA: Diagnosis present

## 2018-10-13 DIAGNOSIS — F0390 Unspecified dementia without behavioral disturbance: Secondary | ICD-10-CM | POA: Diagnosis present

## 2018-10-13 DIAGNOSIS — R2681 Unsteadiness on feet: Secondary | ICD-10-CM

## 2018-10-13 DIAGNOSIS — F1029 Alcohol dependence with unspecified alcohol-induced disorder: Secondary | ICD-10-CM

## 2018-10-13 DIAGNOSIS — G9341 Metabolic encephalopathy: Secondary | ICD-10-CM | POA: Diagnosis present

## 2018-10-13 DIAGNOSIS — Z8546 Personal history of malignant neoplasm of prostate: Secondary | ICD-10-CM | POA: Diagnosis not present

## 2018-10-13 DIAGNOSIS — Z66 Do not resuscitate: Secondary | ICD-10-CM | POA: Diagnosis present

## 2018-10-13 DIAGNOSIS — I429 Cardiomyopathy, unspecified: Secondary | ICD-10-CM | POA: Diagnosis present

## 2018-10-13 DIAGNOSIS — R0609 Other forms of dyspnea: Secondary | ICD-10-CM | POA: Diagnosis present

## 2018-10-13 DIAGNOSIS — Z008 Encounter for other general examination: Secondary | ICD-10-CM | POA: Diagnosis not present

## 2018-10-13 DIAGNOSIS — R2689 Other abnormalities of gait and mobility: Secondary | ICD-10-CM | POA: Diagnosis present

## 2018-10-13 DIAGNOSIS — Z833 Family history of diabetes mellitus: Secondary | ICD-10-CM | POA: Diagnosis not present

## 2018-10-13 DIAGNOSIS — N183 Chronic kidney disease, stage 3 (moderate): Secondary | ICD-10-CM | POA: Diagnosis present

## 2018-10-13 DIAGNOSIS — R32 Unspecified urinary incontinence: Secondary | ICD-10-CM | POA: Diagnosis present

## 2018-10-13 DIAGNOSIS — K5900 Constipation, unspecified: Secondary | ICD-10-CM | POA: Diagnosis present

## 2018-10-13 DIAGNOSIS — G40909 Epilepsy, unspecified, not intractable, without status epilepticus: Secondary | ICD-10-CM | POA: Diagnosis present

## 2018-10-13 DIAGNOSIS — I5022 Chronic systolic (congestive) heart failure: Secondary | ICD-10-CM | POA: Diagnosis present

## 2018-10-13 DIAGNOSIS — R4182 Altered mental status, unspecified: Secondary | ICD-10-CM

## 2018-10-13 DIAGNOSIS — Z79899 Other long term (current) drug therapy: Secondary | ICD-10-CM | POA: Diagnosis not present

## 2018-10-13 DIAGNOSIS — E1122 Type 2 diabetes mellitus with diabetic chronic kidney disease: Secondary | ICD-10-CM | POA: Diagnosis present

## 2018-10-13 DIAGNOSIS — F1010 Alcohol abuse, uncomplicated: Secondary | ICD-10-CM | POA: Diagnosis present

## 2018-10-13 DIAGNOSIS — Z955 Presence of coronary angioplasty implant and graft: Secondary | ICD-10-CM | POA: Diagnosis not present

## 2018-10-13 DIAGNOSIS — I130 Hypertensive heart and chronic kidney disease with heart failure and stage 1 through stage 4 chronic kidney disease, or unspecified chronic kidney disease: Secondary | ICD-10-CM | POA: Diagnosis present

## 2018-10-13 DIAGNOSIS — E1129 Type 2 diabetes mellitus with other diabetic kidney complication: Secondary | ICD-10-CM

## 2018-10-13 DIAGNOSIS — R269 Unspecified abnormalities of gait and mobility: Secondary | ICD-10-CM | POA: Diagnosis not present

## 2018-10-13 DIAGNOSIS — E1142 Type 2 diabetes mellitus with diabetic polyneuropathy: Secondary | ICD-10-CM | POA: Diagnosis present

## 2018-10-13 DIAGNOSIS — I2510 Atherosclerotic heart disease of native coronary artery without angina pectoris: Secondary | ICD-10-CM | POA: Diagnosis present

## 2018-10-13 DIAGNOSIS — Z20828 Contact with and (suspected) exposure to other viral communicable diseases: Secondary | ICD-10-CM | POA: Diagnosis present

## 2018-10-13 LAB — CBC
HCT: 39.7 % (ref 39.0–52.0)
Hemoglobin: 13.6 g/dL (ref 13.0–17.0)
MCH: 32.3 pg (ref 26.0–34.0)
MCHC: 34.3 g/dL (ref 30.0–36.0)
MCV: 94.3 fL (ref 80.0–100.0)
Platelets: 146 10*3/uL — ABNORMAL LOW (ref 150–400)
RBC: 4.21 MIL/uL — ABNORMAL LOW (ref 4.22–5.81)
RDW: 12.6 % (ref 11.5–15.5)
WBC: 7.6 10*3/uL (ref 4.0–10.5)
nRBC: 0 % (ref 0.0–0.2)

## 2018-10-13 LAB — GLUCOSE, CAPILLARY
Glucose-Capillary: 106 mg/dL — ABNORMAL HIGH (ref 70–99)
Glucose-Capillary: 239 mg/dL — ABNORMAL HIGH (ref 70–99)
Glucose-Capillary: 246 mg/dL — ABNORMAL HIGH (ref 70–99)
Glucose-Capillary: 122 mg/dL — ABNORMAL HIGH (ref 70–99)

## 2018-10-13 LAB — BASIC METABOLIC PANEL
Anion gap: 11 (ref 5–15)
BUN: 19 mg/dL (ref 8–23)
CO2: 23 mmol/L (ref 22–32)
Calcium: 9.2 mg/dL (ref 8.9–10.3)
Chloride: 103 mmol/L (ref 98–111)
Creatinine, Ser: 1.37 mg/dL — ABNORMAL HIGH (ref 0.61–1.24)
GFR calc Af Amer: 55 mL/min — ABNORMAL LOW (ref >60)
GFR calc non Af Amer: 48 mL/min — ABNORMAL LOW (ref >60)
Glucose, Bld: 130 mg/dL — ABNORMAL HIGH (ref 70–99)
Potassium: 3.9 mmol/L (ref 3.5–5.1)
Sodium: 137 mmol/L (ref 135–145)

## 2018-10-13 LAB — HEMOGLOBIN A1C
Hgb A1c MFr Bld: 6.6 % — ABNORMAL HIGH (ref 4.8–5.6)
Mean Plasma Glucose: 142.72 mg/dL

## 2018-10-13 LAB — LIPID PANEL
Cholesterol: 153 mg/dL (ref 0–200)
HDL: 48 mg/dL (ref >40)
LDL Cholesterol: 89 mg/dL (ref 0–99)
Total CHOL/HDL Ratio: 3.2 RATIO
Triglycerides: 78 mg/dL (ref <150)
VLDL: 16 mg/dL (ref 0–40)

## 2018-10-13 NOTE — NC FL2 (Signed)
[place] MEDICAID FL2 LEVEL OF CARE SCREENING TOOL     IDENTIFICATION  Patient Name: Daniel [name] Birthdate: 28-May-1933 Sex: male Admission Date (Current Location): 10/11/2018  [facility] and [place] Number:  Herbalist and Address:  [facility], [place] [address], [place], [place] [address]      Provider Number: [id number]  Attending Physician Name and Address:  Cristal Ford, DO  Relative Name and Phone Number:  [name] (Spouse) [phone]    Current Level of Care: Hospital Recommended Level of Care: [place] Prior Approval Number:    Date Approved/Denied:   PASRR Number: [id number]  Discharge Plan: SNF    Current Diagnoses: Patient Active Problem List   Diagnosis Date Noted  . Acute metabolic encephalopathy 81/15/7262  . Gait instability 10/12/2018  . HLD (hyperlipidemia) 10/12/2018  . Elevated troponin 10/12/2018  . Hypoglycemia 07/19/2017  . Hypotension 07/19/2017  . Normochromic normocytic anemia 07/19/2017  . Thrombocytopenia ([place]) 07/19/2017  . CKD (chronic kidney disease), stage III ([place]) 07/19/2017  . Seizure ([place]) 06/16/2017  . Type II diabetes mellitus with neurological manifestations ([place]) 03/05/2015  . Porokeratosis 03/05/2015  . Chronic systolic CHF (congestive heart failure) ([place]) 01/28/2015  . Coronary artery disease 01/28/2015  . Bruit of left carotid artery 01/28/2015  . 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 H/O detached retina repair 08/23/2014  . Acute on chronic renal failure ([place]) 04/17/2013  . Dehydration 04/17/2013  . Alcohol dependence ([place]) 04/17/2013  . Transaminasemia 04/17/2013  . Type II diabetes mellitus with renal manifestations ([place]) 04/17/2013  . Near syncope 04/17/2013  . UTI (lower urinary tract infection) 04/17/2013  . Hematuria 04/17/2013    Orientation RESPIRATION BLADDER Height & Weight     Self, Time, Situation, Place  Normal  Continent Weight: 84.8 kg Height:  5' 8.5" (174 cm)  BEHAVIORAL SYMPTOMS/MOOD NEUROLOGICAL BOWEL NUTRITION STATUS    (hx of seizure) Continent Diet(please see d/c summary)  AMBULATORY STATUS COMMUNICATION OF NEEDS Skin   Extensive Assist Verbally Normal                       Personal Care Assistance Level of Assistance  Bathing, Feeding, Dressing Bathing Assistance: Maximum assistance Feeding assistance: Independent Dressing Assistance: Maximum assistance     Functional Limitations Info             SPECIAL CARE FACTORS FREQUENCY  PT (By licensed PT), OT (By licensed OT)     PT Frequency: 5X/WK OT Frequency: 5X/WK            Contractures      Additional Factors Info  Code Status, Allergies, Insulin Sliding Scale Code Status Info: DNR Allergies Info: No Known Allergies   Insulin Sliding Scale Info: NOVOLOG tid, please refer to d/c summary       Current Medications (10/13/2018):  This is the current hospital active medication list Current Facility-Administered Medications  Medication Dose Route Frequency Provider Last Rate Last Dose  . acetaminophen (TYLENOL) tablet 650 mg  650 mg Oral Q6H PRN Ivor Costa, MD       Or  . acetaminophen (TYLENOL) suppository 650 mg  650 mg Rectal Q6H PRN Ivor Costa, MD      . aspirin EC tablet 81 mg  81 mg Oral Daily Ivor Costa, MD   81 mg at 10/12/18 0859  . enoxaparin (LOVENOX) injection 40 mg  40 mg Subcutaneous Daily Ivor Costa, MD  40 mg at 10/12/18 0857  . finasteride (PROSCAR) tablet 5 mg  5 mg Oral Daily Ivor Costa, MD   5 mg at 10/12/18 0858  . folic acid (FOLVITE) tablet 1 mg  1 mg Oral Daily Ivor Costa, MD   1 mg at 10/12/18 0858  . furosemide (LASIX) injection 20 mg  20 mg Intravenous Daily Ivor Costa, MD   20 mg at 10/12/18 0857  . hydrALAZINE (APRESOLINE) injection 5 mg  5 mg Intravenous Q2H PRN Ivor Costa, MD      . insulin aspart (novoLOG) injection 0-5 Units  0-5 Units Subcutaneous QHS Ivor Costa, MD   2 Units at  10/12/18 2225  . insulin aspart (novoLOG) injection 0-9 Units  0-9 Units Subcutaneous TID WC Ivor Costa, MD      . latanoprost (XALATAN) 0.005 % ophthalmic solution 1 drop  1 drop Both Eyes QHS Ivor Costa, MD   1 drop at 10/12/18 2225  . levETIRAcetam (KEPPRA) tablet 500 mg  500 mg Oral BID Ivor Costa, MD   500 mg at 10/12/18 2225  . LORazepam (ATIVAN) injection 0-4 mg  0-4 mg Intravenous BID Ivor Costa, MD       Followed by  . LORazepam (ATIVAN) injection 0-4 mg  0-4 mg Intravenous Q12H Ivor Costa, MD      . LORazepam (ATIVAN) injection 1 mg  1 mg Intravenous Q2H PRN Ivor Costa, MD      . LORazepam (ATIVAN) tablet 1 mg  1 mg Oral Q6H PRN Ivor Costa, MD       Or  . LORazepam (ATIVAN) injection 1 mg  1 mg Intravenous Q6H PRN Ivor Costa, MD      . multivitamin with minerals tablet 1 tablet  1 tablet Oral Daily Ivor Costa, MD   1 tablet at 10/12/18 [phone]  . ondansetron (ZOFRAN) tablet 4 mg  4 mg Oral Q6H PRN Ivor Costa, MD       Or  . ondansetron [facility]) injection 4 mg  4 mg Intravenous Q6H PRN Ivor Costa, MD      . rosuvastatin (CRESTOR) tablet 10 mg  10 mg Oral QPM Ivor Costa, MD   10 mg at 10/12/18 1700  . sodium chloride flush (NS) 0.9 % injection 3 mL  3 mL Intravenous Once Ivor Costa, MD      . thiamine (VITAMIN B-1) tablet 100 mg  100 mg Oral Daily Ivor Costa, MD   100 mg at 10/12/18 1914   Or  . thiamine (B-1) injection 100 mg  100 mg Intravenous Daily Ivor Costa, MD      . vitamin C (ASCORBIC ACID) tablet 500 mg  500 mg Oral QPM Ivor Costa, MD   500 mg at 10/12/18 1700     Discharge Medications: Please see discharge summary for a list of discharge medications.  Relevant Imaging Results:  Relevant Lab Results:   Additional Information SS#: [id number]  Sharin Mons, RN

## 2018-10-13 NOTE — Progress Notes (Signed)
PROGRESS NOTE    Daniel [name]  TKW:[id number] DOB: 1933-03-27 DOA: 10/11/2018 PCP: Jani Gravel, MD   Brief Narrative:  HPI on 10/12/2018 by Dr. Ivor Costa Daniel [name] is a 85 y.o. male with medical history significant of hypertension, hyperlipidemia, diabetes mellitus, alcohol abuse, seizure, CAD, stent placement, prostate cancer (radiation seeds placement), CKD stage III, who presents with generalized weakness, gait instability and AMS.  Per his wife (I called his wife by phone),  Pt has been having generalized weakness and gait instability in the past several days.  His wife is very concerned that the patient may fall, but fortunately patient did not fall. The patient normally uses a walker, but was unable to ambulate with this. Wife states that pt normally can tell you the month and year, but is not able to do so today. The patient does not always know the day.  Patient does not have facial droop or slurred speech.  Patient does not have any chest pain, shortness of breath, fever or chills.  He has some mild dry cough.  No nausea vomiting, diarrhea, abdominal pain, symptoms of UTI. Assessment & Plan   Acute metabolic encephalopathy with gait instability -Etiology unclear. Currently alert and oriented to self and place -CT head: No acute intercranial normality -MRI brain: No acute or reversible finding -COVID negative -UA and CXR unremarkable for infection -Ammonia level 29, TSH 2.253, vitamin B12 1089 -PT, OT consulted. PT recommended SNF. OT recommended HH. -Discussed with patient- he would like to go home. Discussed this with his wife, who referred [place] to his son. Not sure patient fully understands how much are he needs and his wife recently had surgery. Discharging to home is not the safest or best option for the patient.  -Wife tells me that he has not missed any of medications doses. -Psychiatry consulted for assessment of medical capacity  Alcohol dependence -Continue CIWA  protocol -no signs of withdrawal  Diabetes mellitus, type II -Continue insulin sliding scale with CBG monitoring  CAD with mildly elevated troponin and BNP -Currently no complaints of chest pain or shortness of breath -Echocardiogram 06/09/2017: EF 60-65% -Continue to trend troponin -BNP 509.1 -Cardiology consulted and appreciated by admitted physician -Continue aspirin, statin -patient started lasix  -Echocardiogram EF 45-50%. LV diastolic parameters with pseudonormalization  -Cardiology compared studies to previous year and found that they were likely similar.  Global hypokinesis.  No indication for further inpatient work-up.  Patient to follow-up with cardiology as an outpatient.  Seizure disorder -Continue precautions -Continue keppra and ativan PRN  Chronic kidney disease, Stage III -Stable  Hyperlipidemia -Continue statin  DVT Prophylaxis  lovenox  Code Status: DNR  Family Communication: None at bedside.  Disposition Plan: Admitted. Pending psychiatry consultation for capacity. Feel that patient continues to have ongoing confusion, unknown etiology. He si not a safe discharge to home.  Consultants Cardiology Psychiatry   Procedures  Echocardiogram  Antibiotics   Anti-infectives (From admission, onward)   None      Subjective:   Daniel [name] seen and examined today.  Patient with no complaints this morning.  Wanting to go home denies chest pain or shortness of breath, abdominal pain, nausea or vomiting, diarrhea or constipation, dizziness or headache. Objective:   Vitals:   10/12/18 0453 10/12/18 1446 10/12/18 2122 10/13/18 0506  BP: 129/69 (!) 128/55 121/62 138/61  Pulse: 67 71 73 81  Resp: 18 16 18 18   Temp: 97.8 F (36.6 C) (!) 97.5 F (36.4 C) 98.1  F (36.7 C) 97.8 F (36.6 C)  TempSrc: Oral     SpO2: 98% 98% 100% 98%  Weight:      Height:        Intake/Output Summary (Last 24 hours) at 10/13/2018 1409 Last data filed at 10/13/2018  0528 Gross per 24 hour  Intake 240 ml  Output 850 ml  Net -610 ml   Filed Weights   10/11/18 1951  Weight: 84.8 kg   Exam  General: Well developed, well nourished, NAD  HEENT: NCAT, mucous membranes moist.   Cardiovascular: S1 S2 auscultated, soft, SEM, RRR  Respiratory: Clear to auscultation bilaterally with equal chest rise  Abdomen: Soft, nontender, nondistended, + bowel sounds  Extremities: warm dry without cyanosis clubbing. LLE edema > RLE  Neuro: AAOx2 (self, place), nonfocal   Psych: Appropraite mood and affect. Lacks insight into medical needs.   Data Reviewed: I have personally reviewed following labs and imaging studies  CBC: Recent Labs  Lab 10/11/18 1810 10/12/18 0222 10/13/18 0251  WBC 7.1 7.3 7.6  HGB 12.5* 12.5* 13.6  HCT 37.0* 37.7* 39.7  MCV 97.6 97.4 94.3  PLT 124* 133* 932*   Basic Metabolic Panel: Recent Labs  Lab 10/11/18 1810 10/12/18 0222 10/13/18 0251  NA 132* 138 137  K 4.0 4.9 3.9  CL 107 111 103  CO2 18* 22 23  GLUCOSE 163* 103* 130*  BUN 22 18 19   CREATININE 1.41* 1.33* 1.37*  CALCIUM 8.2* 8.6* 9.2   GFR: Estimated Creatinine Clearance: 44.5 mL/min (A) (by C-G formula based on SCr of 1.37 mg/dL (H)). Liver Function Tests: Recent Labs  Lab 10/11/18 1810  AST 75*  ALT 42  ALKPHOS 92  BILITOT 0.8  PROT 5.3*  ALBUMIN 2.6*   No results for input(s): LIPASE, AMYLASE in the last 168 hours. Recent Labs  Lab 10/12/18 0222  AMMONIA 29   Coagulation Profile: No results for input(s): INR, PROTIME in the last 168 hours. Cardiac Enzymes: No results for input(s): CKTOTAL, CKMB, CKMBINDEX, TROPONINI in the last 168 hours. BNP (last 3 results) No results for input(s): PROBNP in the last 8760 hours. HbA1C: Recent Labs    10/13/18 0251  HGBA1C 6.6*   CBG: Recent Labs  Lab 10/12/18 1354 10/12/18 1702 10/12/18 2120 10/13/18 0753 10/13/18 1203  GLUCAP 118* 194* 220* 106* 239*   Lipid Profile: Recent Labs     10/13/18 0251  CHOL 153  HDL 48  LDLCALC 89  TRIG 78  CHOLHDL 3.2   Thyroid Function Tests: Recent Labs    10/12/18 0726  TSH 2.253   Anemia Panel: Recent Labs    10/12/18 0726  VITAMINB12 1,089*   Urine analysis:    Component Value Date/Time   COLORURINE YELLOW 10/12/2018 0103   APPEARANCEUR CLEAR 10/12/2018 0103   LABSPEC 1.020 10/12/2018 0103   PHURINE 7.0 10/12/2018 0103   GLUCOSEU NEGATIVE 10/12/2018 0103   HGBUR NEGATIVE 10/12/2018 0103   BILIRUBINUR NEGATIVE 10/12/2018 0103   KETONESUR NEGATIVE 10/12/2018 0103   PROTEINUR 100 (A) 10/12/2018 0103   UROBILINOGEN 1.0 04/17/2013 1014   NITRITE NEGATIVE 10/12/2018 0103   LEUKOCYTESUR NEGATIVE 10/12/2018 0103   Sepsis Labs: @LABRCNTIP (procalcitonin:4,lacticidven:4)  ) Recent Results (from the past 240 hour(s))  SARS Coronavirus 2 (CEPHEID - Performed in [place] hospital lab), Hosp Order     Status: None   Collection Time: 10/11/18 11:49 PM   Specimen: Nasopharyngeal Swab  Result Value Ref Range Status   SARS Coronavirus 2 NEGATIVE NEGATIVE Final  Comment: (NOTE) If result is NEGATIVE SARS-CoV-2 target nucleic acids are NOT DETECTED. The SARS-CoV-2 RNA is generally detectable in upper and lower  respiratory specimens during the acute phase of infection. The lowest  concentration of SARS-CoV-2 viral copies this assay can detect is 250  copies / mL. A negative result does not preclude SARS-CoV-2 infection  and should not be used as the sole basis for treatment or other  patient management decisions.  A negative result may occur with  improper specimen collection / handling, submission of specimen other  than nasopharyngeal swab, presence of viral mutation(s) within the  areas targeted by this assay, and inadequate number of viral copies  (<250 copies / mL). A negative result must be combined with clinical  observations, patient history, and epidemiological information. If result is POSITIVE SARS-CoV-2  target nucleic acids are DETECTED. The SARS-CoV-2 RNA is generally detectable in upper and lower  respiratory specimens dur ing the acute phase of infection.  Positive  results are indicative of active infection with SARS-CoV-2.  Clinical  correlation with patient history and other diagnostic information is  necessary to determine patient infection status.  Positive results do  not rule out bacterial infection or co-infection with other viruses. If result is PRESUMPTIVE POSTIVE SARS-CoV-2 nucleic acids MAY BE PRESENT.   A presumptive positive result was obtained on the submitted specimen  and confirmed on repeat testing.  While 2019 novel coronavirus  (SARS-CoV-2) nucleic acids may be present in the submitted sample  additional confirmatory testing may be necessary for epidemiological  and / or clinical management purposes  to differentiate between  SARS-CoV-2 and other Sarbecovirus currently known to infect humans.  If clinically indicated additional testing with an alternate test  methodology [phone]) is advised. The SARS-CoV-2 RNA is generally  detectable in upper and lower respiratory sp ecimens during the acute  phase of infection. The expected result is Negative. Fact Sheet for Patients:  StrictlyIdeas.no Fact Sheet for Healthcare Providers: BankingDealers.co.za This test is not yet approved or cleared by the [place] FDA and has been authorized for detection and/or diagnosis of SARS-CoV-2 by FDA under an Emergency Use Authorization (EUA).  This EUA will remain in effect (meaning this test can be used) for the duration of the COVID-19 declaration under Section 564(b)(1) of the Act, 21 U.S.C. section 360bbb-3(b)(1), unless the authorization is terminated or revoked sooner. Performed at [facility], [place] [address], [place], [place] [address]       Radiology Studies: Ct Head Wo Contrast  Result Date:  10/11/2018 CLINICAL DATA:  Altered level of consciousness (LOC), unexplained. Increasing lethargy and generalized weakness. EXAM: CT HEAD WITHOUT CONTRAST TECHNIQUE: Contiguous axial images were obtained from the base of the skull through the vertex without intravenous contrast. COMPARISON:  Head CT 01/23/2018 FINDINGS: Brain: No evidence of acute infarction, hemorrhage, hydrocephalus, extra-axial collection or mass lesion/mass effect. Advanced atrophy, unchanged from prior exam. Moderate chronic small vessel ischemia. Vascular: Atherosclerosis of skullbase vasculature without hyperdense vessel or abnormal calcification. Skull: No fracture or focal lesion. Sinuses/Orbits: Chronic fluid level in mucosal thickening in left side of sphenoid sinus. Chronic mucosal thickening of the right maxillary sinus and ethmoid air cells. Other: None. IMPRESSION: No acute intracranial abnormality. Stable atrophy and chronic small vessel ischemia. Electronically Signed   By: Keith Rake M.D.   On: 10/11/2018 21:34   Mr Brain Wo Contrast (neuro Protocol)  Result Date: 10/12/2018 CLINICAL DATA:  Altered level of consciousness EXAM: MRI HEAD WITHOUT CONTRAST  TECHNIQUE: Multiplanar, multiecho pulse sequences of the brain and surrounding structures were obtained without intravenous contrast. COMPARISON:  CT from yesterday FINDINGS: Brain: No acute infarction, hemorrhage, hydrocephalus, extra-axial collection or mass lesion. Advanced generalized brain atrophy. Periventricular chronic small vessel ischemic change, mild for age. Small remote right cerebellar infarcts. Vascular: Major flow voids are preserved Skull and upper cervical spine: Negative for marrow lesion Sinuses/Orbits: Bilateral cataract resection and left scleral banding. Incidental mucosal thickening in the left sphenoid and right maxillary sinuses with right maxillary sinus atelectasis. IMPRESSION: 1. No acute or reversible finding. 2. Advanced brain atrophy.  Electronically Signed   By: Monte Fantasia M.D.   On: 10/12/2018 04:49   Dg Chest Port 1 View  Result Date: 10/12/2018 CLINICAL DATA:  MRI clearance. EXAM: PORTABLE CHEST 1 VIEW COMPARISON:  Chest radiograph dated 01/23/2018 FINDINGS: Shallow inspiration with bibasilar atelectasis. No new consolidative changes. There is no pleural effusion or pneumothorax. Stable cardiomegaly. Atherosclerotic calcification of the aortic arch. Several tiny linear radiopaque densities over the left hemidiaphragm, likely surgical clips. No other radiopaque foreign object identified. IMPRESSION: No acute findings.  No interval change. Electronically Signed   By: Anner Crete M.D.   On: 10/12/2018 00:43     Scheduled Meds:  aspirin EC  81 mg Oral Daily   enoxaparin (LOVENOX) injection  40 mg Subcutaneous Daily   finasteride  5 mg Oral Daily   folic acid  1 mg Oral Daily   furosemide  20 mg Intravenous Daily   insulin aspart  0-5 Units Subcutaneous QHS   insulin aspart  0-9 Units Subcutaneous TID WC   latanoprost  1 drop Both Eyes QHS   levETIRAcetam  500 mg Oral BID   LORazepam  0-4 mg Intravenous BID   Followed by   LORazepam  0-4 mg Intravenous Q12H   multivitamin with minerals  1 tablet Oral Daily   rosuvastatin  10 mg Oral QPM   sodium chloride flush  3 mL Intravenous Once   thiamine  100 mg Oral Daily   Or   thiamine  100 mg Intravenous Daily   vitamin C  500 mg Oral QPM   Continuous Infusions:   LOS: 0 days   Time Spent in minutes   30 minutes  Oswell Say D.O. on 10/13/2018 at 2:09 PM  Between 7am to 7pm - Please see pager noted on [email or web address]  After 7pm go to [email or web address]  And look for the night coverage person covering for me after hours  [facility] Office  [phone]

## 2018-10-13 NOTE — Consult Note (Addendum)
Telepsych Consultation   Reason for Consult:  "Health competency" Referring Physician:  Dr. Cristal Ford Location of Patient: [facility] Location of Provider: [facility]  Patient Identification: Daniel [name] MRN:  [id number] Principal Diagnosis: Evaluation by psychiatric service required Diagnosis:  Principal Problem:   Acute metabolic encephalopathy Active Problems:   Alcohol dependence ([place])   Type II diabetes mellitus with renal manifestations ([place])   Coronary artery disease   Seizure ([place])   CKD (chronic kidney disease), stage III (HCC)   Gait instability   HLD (hyperlipidemia)   Elevated troponin   Total Time spent with patient: 1 hour  Subjective:   Daniel [name] is a 85 y.o. male patient admitted with acute metabolic encephalopathy with gait instability.  HPI:   Per chart review, patient was admitted with acute metabolic encephalopathy with gait instability of unknown etiology. Medical history is significant for seizure disorder and alcohol abuse. PT recommends SNF placement due to high risk of falls. His wife is elderly and unable to assist with ambulation. He is unable to state how he can maintain his safety at home or what he would do if he fell. PMP indicates he recently filled a prescription for Xanax 0.25 mg BID PRN. His medication was prescribed by Dr. Jani Gravel on 7/13.   On interview, Daniel [name] reports that he does not want go to a SNF. He reports, "They have all [name] of excuses." He wants to stay at home because he has his "house and car paid for." He reports that "they think I'm crazy" when asked why he was recommended for SNF placement. He denies problems with falls or ambulation. His wife helps with bathing. He reports eating out daily. He is unable to explain how he obtains food. He is unable to state how he would obtain how if he had a fall. He reports that he could call a service that takes him "shopping or to the barber shop." He denies  mood symptoms. Denies SI, HI or AVH.   Past Psychiatric History: Alcohol abuse   Risk to Self:  None. Denies SI.  Risk to Others:  None. Denies HI.  Prior Inpatient Therapy:  Denies  Prior Outpatient Therapy:  Denies   Past Medical History:  Past Medical History:  Diagnosis Date  . Alcohol abuse   . Cancer [facility])    prostate  . Cardiomyopathy ([place])   . CHF (congestive heart failure) ([place])   . Complication of anesthesia    Hx of seizure when put to sleep for surgery on 08/2009  . Constipation   . Coronary artery disease   . Diabetes mellitus without complication ([place])   . H/O hiatal hernia   . Hyperlipidemia   . Hypertension   . Peripheral neuropathy   . Seizures ([place])   . Urinary incontinence     Past Surgical History:  Procedure Laterality Date  . CATARACT EXTRACTION, BILATERAL    . CORONARY ANGIOPLASTY     stents x 2  . CORONARY STENT PLACEMENT     2004  . PROSTATE SURGERY  2008   SEED IMPLANTS  . RADIOACTIVE SEED IMPLANT     prostate  . TONSILLECTOMY     1940's   Family History:  Family History  Problem Relation Age of Onset  . Diabetes Mother   . Heart disease Father   . Heart disease Brother   . Diabetes Brother    Family Psychiatric  History: Denies  Social History:  Social History  Substance and Sexual Activity  Alcohol Use Yes     Social History   Substance and Sexual Activity  Drug Use No    Social History   Socioeconomic History  . Marital status: Married    Spouse name: Not on file  . Number of children: Not on file  . Years of education: Not on file  . Highest education level: Not on file  Occupational History  . Not on file  Social Needs  . Financial resource strain: Not on file  . Food insecurity    Worry: Not on file    Inability: Not on file  . Transportation needs    Medical: Not on file    Non-medical: Not on file  Tobacco Use  . Smoking status: Never Smoker  . Smokeless tobacco: Never Used  Substance and Sexual  Activity  . Alcohol use: Yes  . Drug use: No  . Sexual activity: Never  Lifestyle  . Physical activity    Days per week: Not on file    Minutes per session: Not on file  . Stress: Not on file  Relationships  . Social Herbalist on phone: Not on file    Gets together: Not on file    Attends religious service: Not on file    Active member of club or organization: Not on file    Attends meetings of clubs or organizations: Not on file    Relationship status: Not on file  Other Topics Concern  . Not on file  Social History Narrative  . Not on file   Additional Social History: He lives at home with his wife of 56 years. He has 2 adult children. He is retired. 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 He denies alcohol or illicit substance use. He quit drinking 3 years ago.     Allergies:  No Known Allergies  Labs:  Results for orders placed or performed during the hospital encounter of 10/11/18 (from the past 48 hour(s))  Comprehensive metabolic panel     Status: Abnormal   Collection Time: 10/11/18  6:10 PM  Result Value Ref Range   Sodium 132 (L) 135 - 145 mmol/L   Potassium 4.0 3.5 - 5.1 mmol/L   Chloride 107 98 - 111 mmol/L   CO2 18 (L) 22 - 32 mmol/L   Glucose, Bld 163 (H) 70 - 99 mg/dL   BUN 22 8 - 23 mg/dL   Creatinine, Ser 1.41 (H) 0.61 - 1.24 mg/dL   Calcium 8.2 (L) 8.9 - 10.3 mg/dL   Total Protein 5.3 (L) 6.5 - 8.1 g/dL   Albumin 2.6 (L) 3.5 - 5.0 g/dL   AST 75 (H) 15 - 41 U/L   ALT 42 0 - 44 U/L   Alkaline Phosphatase 92 38 - 126 U/L   Total Bilirubin 0.8 0.3 - 1.2 mg/dL   GFR calc non Af Amer 46 (L) >60 mL/min   GFR calc Af Amer 53 (L) >60 mL/min   Anion gap 7 5 - 15    Comment: Performed at [facility], [address] [place], [place] [address]  CBC     Status: Abnormal   Collection Time: 10/11/18  6:10 PM  Result Value Ref Range   WBC 7.1 4.0 - 10.5 K/uL   RBC 3.79 (L) 4.22 - 5.81 MIL/uL   Hemoglobin 12.5 (L) 13.0 - 17.0 g/dL    HCT 37.0 (L) 39.0 - 52.0 %  MCV 97.6 80.0 - 100.0 fL   MCH 33.0 26.0 - 34.0 pg   MCHC 33.8 30.0 - 36.0 g/dL   RDW 12.9 11.5 - 15.5 %   Platelets 124 (L) 150 - 400 K/uL    Comment: REPEATED TO VERIFY   nRBC 0.0 0.0 - 0.2 %    Comment: Performed at [facility], [place] [address] [place], [place] [address]  CBG monitoring, ED     Status: Abnormal   Collection Time: 10/11/18  8:13 PM  Result Value Ref Range   Glucose-Capillary 125 (H) 70 - 99 mg/dL   Comment 1 Document in Chart   Lactic acid, plasma     Status: None   Collection Time: 10/11/18 10:07 PM  Result Value Ref Range   Lactic Acid, Venous 1.1 0.5 - 1.9 mmol/L    Comment: Performed at [facility] [address], [place], [place] [address]  SARS Coronavirus 2 (CEPHEID - Performed in [place] hospital lab), Hosp Order     Status: None   Collection Time: 10/11/18 11:49 PM   Specimen: Nasopharyngeal Swab  Result Value Ref Range   SARS Coronavirus 2 NEGATIVE NEGATIVE    Comment: (NOTE) If result is NEGATIVE SARS-CoV-2 target nucleic acids are NOT DETECTED. The SARS-CoV-2 RNA is generally detectable in upper and lower  respiratory specimens during the acute phase of infection. The lowest  concentration of SARS-CoV-2 viral copies this assay can detect is 250  copies / mL. A negative result does not preclude SARS-CoV-2 infection  and should not be used as the sole basis for treatment or other  patient management decisions.  A negative result may occur with  improper specimen collection / handling, submission of specimen other  than nasopharyngeal swab, presence of viral mutation(s) within the  areas targeted by this assay, and inadequate number of viral copies  (<250 copies / mL). A negative result must be combined with clinical  observations, patient history, and epidemiological information. If result is POSITIVE SARS-CoV-2 target nucleic acids are DETECTED. The SARS-CoV-2 RNA is generally detectable in upper and  lower  respiratory specimens dur ing the acute phase of infection.  Positive  results are indicative of active infection with SARS-CoV-2.  Clinical  correlation with patient history and other diagnostic information is  necessary to determine patient infection status.  Positive results do  not rule out bacterial infection or co-infection with other viruses. If result is PRESUMPTIVE POSTIVE SARS-CoV-2 nucleic acids MAY BE PRESENT.   A presumptive positive result was obtained on the submitted specimen  and confirmed on repeat testing.  While 2019 novel coronavirus  (SARS-CoV-2) nucleic acids may be present in the submitted sample  additional confirmatory testing may be necessary for epidemiological  and / or clinical management purposes  to differentiate between  SARS-CoV-2 and other Sarbecovirus currently known to infect humans.  If clinically indicated additional testing with an alternate test  methodology [phone]) is advised. The SARS-CoV-2 RNA is generally  detectable in upper and lower respiratory sp ecimens during the acute  phase of infection. The expected result is Negative. Fact Sheet for Patients:  StrictlyIdeas.no Fact Sheet for Healthcare Providers: BankingDealers.co.za This test is not yet approved or cleared by the [place] FDA and has been authorized for detection and/or diagnosis of SARS-CoV-2 by FDA under an Emergency Use Authorization (EUA).  This EUA will remain in effect (meaning this test can be used) for the duration of the COVID-19 declaration under Section 564(b)(1)  of the Act, 21 U.S.C. section 360bbb-3(b)(1), unless the authorization is terminated or revoked sooner. Performed at [facility], [place] [address], [place], [place] [address]   Urinalysis, Routine w reflex microscopic     Status: Abnormal   Collection Time: 10/12/18  1:03 AM  Result Value Ref Range   Color, Urine YELLOW YELLOW   APPearance  CLEAR CLEAR   Specific Gravity, Urine 1.020 1.005 - 1.030   pH 7.0 5.0 - 8.0   Glucose, UA NEGATIVE NEGATIVE mg/dL   Hgb urine dipstick NEGATIVE NEGATIVE   Bilirubin Urine NEGATIVE NEGATIVE   Ketones, ur NEGATIVE NEGATIVE mg/dL   Protein, ur 100 (A) NEGATIVE mg/dL   Nitrite NEGATIVE NEGATIVE   Leukocytes,Ua NEGATIVE NEGATIVE    Comment: Performed at [facility] [address], [place], [place] [address]  Urinalysis, Microscopic (reflex)     Status: None   Collection Time: 10/12/18  1:03 AM  Result Value Ref Range   RBC / HPF 0-5 0 - 5 RBC/hpf   WBC, UA 0-5 0 - 5 WBC/hpf   Bacteria, UA NONE SEEN NONE SEEN   Squamous Epithelial / LPF NONE SEEN 0 - 5    Comment: Performed at [facility], [place] [address] [place], [place] [address]  CBG monitoring, ED     Status: None   Collection Time: 10/12/18  1:11 AM  Result Value Ref Range   Glucose-Capillary 88 70 - 99 mg/dL  Lactic acid, plasma     Status: None   Collection Time: 10/12/18  2:22 AM  Result Value Ref Range   Lactic Acid, Venous 1.3 0.5 - 1.9 mmol/L    Comment: Performed at [facility] [address], [place],  [place] [address]  Brain natriuretic peptide     Status: Abnormal   Collection Time: 10/12/18  2:22 AM  Result Value Ref Range   B Natriuretic Peptide 509.1 (H) 0.0 - 100.0 pg/mL    Comment: Performed at [facility] [address], [place], [place] [address]  Ammonia     Status: None   Collection Time: 10/12/18  2:22 AM  Result Value Ref Range   Ammonia 29 9 - 35 umol/L    Comment: Performed at [facility], [place] [address] [place], [place] [address]  Basic metabolic panel     Status: Abnormal   Collection Time: 10/12/18  2:22 AM  Result Value Ref Range   Sodium 138 135 - 145 mmol/L   Potassium 4.9 3.5 - 5.1 mmol/L   Chloride 111 98 - 111 mmol/L   CO2 22 22 - 32 mmol/L   Glucose, Bld 103 (H) 70 - 99 mg/dL   BUN 18 8 - 23 mg/dL   Creatinine, Ser 1.33 (H) 0.61 - 1.24 mg/dL    Calcium 8.6 (L) 8.9 - 10.3 mg/dL   GFR calc non Af Amer 49 (L) >60 mL/min   GFR calc Af Amer 57 (L) >60 mL/min   Anion gap 5 5 - 15    Comment: Performed at [facility] [address], [place] [address]  CBC     Status: Abnormal   Collection Time: 10/12/18  2:22 AM  Result Value Ref Range   WBC 7.3 4.0 - 10.5 K/uL   RBC 3.87 (L) 4.22 - 5.81 MIL/uL   Hemoglobin 12.5 (L) 13.0 - 17.0 g/dL   HCT 37.7 (L) 39.0 - 52.0 %   MCV 97.4 80.0 -  100.0 fL   MCH 32.3 26.0 - 34.0 pg   MCHC 33.2 30.0 - 36.0 g/dL   RDW 12.9 11.5 - 15.5 %   Platelets 133 (L) 150 - 400 K/uL   nRBC 0.0 0.0 - 0.2 %    Comment: Performed at [facility], [place] [address] [place], [place] [address]  Troponin I (High Sensitivity)     Status: Abnormal   Collection Time: 10/12/18  2:22 AM  Result Value Ref Range   Troponin I (High Sensitivity) 52 (H) <18 ng/L    Comment: (NOTE) Elevated high sensitivity troponin I (hsTnI) values and significant  changes across serial measurements may suggest ACS but many other  chronic and acute conditions are known to elevate hsTnI results.  Refer to the Links section for chest pain algorithms and additional  guidance. Performed at [facility], [place] [address] [place], [place] [address]   Troponin I (High Sensitivity)     Status: Abnormal   Collection Time: 10/12/18  5:43 AM  Result Value Ref Range   Troponin I (High Sensitivity) 55 (H) <18 ng/L    Comment: (NOTE) Elevated high sensitivity troponin I (hsTnI) values and significant  changes across serial measurements may suggest ACS but many other  chronic and acute conditions are known to elevate hsTnI results.  Refer to the "Links" section for chest pain algorithms and additional  guidance. Performed at [facility], [place] [address], [place], [place] [address]   TSH     Status: None   Collection Time: 10/12/18  7:26 AM  Result Value Ref Range   TSH 2.253 0.350 - 4.500 uIU/mL    Comment:  Performed by a 3rd Generation assay with a functional sensitivity of <=0.01 uIU/mL. Performed at [facility], [place] [address] [place], [place] [address]   Vitamin B12     Status: Abnormal   Collection Time: 10/12/18  7:26 AM  Result Value Ref Range   Vitamin B-12 1,089 (H) 180 - 914 pg/mL    Comment: (NOTE) This assay is not validated for testing neonatal or myeloproliferative syndrome specimens for Vitamin B12 levels. Performed at [facility], [place] [address], [place], [place] [address]   Glucose, capillary     Status: Abnormal   Collection Time: 10/12/18  1:54 PM  Result Value Ref Range   Glucose-Capillary 118 (H) 70 - 99 mg/dL  Glucose, capillary     Status: Abnormal   Collection Time: 10/12/18  5:02 PM  Result Value Ref Range   Glucose-Capillary 194 (H) 70 - 99 mg/dL  Glucose, capillary     Status: Abnormal   Collection Time: 10/12/18  9:20 PM  Result Value Ref Range   Glucose-Capillary 220 (H) 70 - 99 mg/dL  CBC     Status: Abnormal   Collection Time: 10/13/18  2:51 AM  Result Value Ref Range   WBC 7.6 4.0 - 10.5 K/uL   RBC 4.21 (L) 4.22 - 5.81 MIL/uL   Hemoglobin 13.6 13.0 - 17.0 g/dL   HCT 39.7 39.0 - 52.0 %   MCV 94.3 80.0 - 100.0 fL   MCH 32.3 26.0 - 34.0 pg   MCHC 34.3 30.0 - 36.0 g/dL   RDW 12.6 11.5 - 15.5 %   Platelets 146 (L) 150 - 400 K/uL   nRBC 0.0 0.0 - 0.2 %    Comment: Performed at [facility], [place], [place], [place] [address]  Basic metabolic  panel     Status: Abnormal   Collection Time: 10/13/18  2:51 AM  Result Value Ref Range   Sodium 137 135 - 145 mmol/L   Potassium 3.9 3.5 - 5.1 mmol/L    Comment: DELTA CHECK NOTED   Chloride 103 98 - 111 mmol/L   CO2 23 22 - 32 mmol/L   Glucose, Bld 130 (H) 70 - 99 mg/dL   BUN 19 8 - 23 mg/dL   Creatinine, Ser 1.37 (H) 0.61 - 1.24 mg/dL   Calcium 9.2 8.9 - 10.3 mg/dL   GFR calc non Af Amer 48 (L) >60 mL/min   GFR calc Af Amer 55 (L) >60 mL/min   Anion gap 11 5 - 15     Comment: Performed at [facility], [place] [address] [place], [place] [address]  Hemoglobin A1c     Status: Abnormal   Collection Time: 10/13/18  2:51 AM  Result Value Ref Range   Hgb A1c MFr Bld 6.6 (H) 4.8 - 5.6 %    Comment: (NOTE) Pre diabetes:          5.7%-6.4% Diabetes:              >6.4% Glycemic control for   <7.0% adults with diabetes    Mean Plasma Glucose 142.72 mg/dL    Comment: Performed at [facility] [address], [place], [place] [address]  Lipid panel     Status: None   Collection Time: 10/13/18  2:51 AM  Result Value Ref Range   Cholesterol 153 0 - 200 mg/dL   Triglycerides 78 <150 mg/dL   HDL 48 >40 mg/dL   Total CHOL/HDL Ratio 3.2 RATIO   VLDL 16 0 - 40 mg/dL   LDL Cholesterol 89 0 - 99 mg/dL    Comment:        Total Cholesterol/HDL:CHD Risk Coronary Heart Disease Risk Table                     Men   Women  1/2 Average Risk   3.4   3.3  Average Risk       5.0   4.4  2 X Average Risk   9.6   7.1  3 X Average Risk  23.4   11.0        Use the calculated Patient Ratio above and the CHD Risk Table to determine the patient's CHD Risk.        ATP III CLASSIFICATION (LDL):  <100     mg/dL   Optimal  100-129  mg/dL   Near or Above                    Optimal  130-159  mg/dL   Borderline  160-189  mg/dL   High  >190     mg/dL   Very High Performed at [facility] [address] [place], [place] [address]   Glucose, capillary     Status: Abnormal   Collection Time: 10/13/18  7:53 AM  Result Value Ref Range   Glucose-Capillary 106 (H) 70 - 99 mg/dL    Medications:  Current Facility-Administered Medications  Medication Dose Route Frequency Provider Last Rate Last Dose  . acetaminophen (TYLENOL) tablet 650 mg  650 mg Oral Q6H PRN Ivor Costa, MD       Or  . acetaminophen (TYLENOL) suppository 650 mg  650 mg Rectal Q6H PRN Ivor Costa, MD      .  aspirin EC tablet 81 mg  81 mg Oral Daily Ivor Costa, MD   81 mg at 10/13/18 1029  .  enoxaparin (LOVENOX) injection 40 mg  40 mg Subcutaneous Daily Ivor Costa, MD   40 mg at 10/13/18 1030  . finasteride (PROSCAR) tablet 5 mg  5 mg Oral Daily Ivor Costa, MD   5 mg at 10/13/18 1030  . folic acid (FOLVITE) tablet 1 mg  1 mg Oral Daily Ivor Costa, MD   1 mg at 10/13/18 1029  . furosemide (LASIX) injection 20 mg  20 mg Intravenous Daily Ivor Costa, MD   20 mg at 10/13/18 1030  . hydrALAZINE (APRESOLINE) injection 5 mg  5 mg Intravenous Q2H PRN Ivor Costa, MD      . insulin aspart (novoLOG) injection 0-5 Units  0-5 Units Subcutaneous QHS Ivor Costa, MD   2 Units at 10/12/18 2225  . insulin aspart (novoLOG) injection 0-9 Units  0-9 Units Subcutaneous TID WC Ivor Costa, MD      . latanoprost (XALATAN) 0.005 % ophthalmic solution 1 drop  1 drop Both Eyes QHS Ivor Costa, MD   1 drop at 10/12/18 2225  . levETIRAcetam (KEPPRA) tablet 500 mg  500 mg Oral BID Ivor Costa, MD   500 mg at 10/13/18 1022  . LORazepam (ATIVAN) injection 0-4 mg  0-4 mg Intravenous BID Ivor Costa, MD       Followed by  . LORazepam (ATIVAN) injection 0-4 mg  0-4 mg Intravenous Q12H Ivor Costa, MD      . LORazepam (ATIVAN) injection 1 mg  1 mg Intravenous Q2H PRN Ivor Costa, MD      . LORazepam (ATIVAN) tablet 1 mg  1 mg Oral Q6H PRN Ivor Costa, MD       Or  . LORazepam (ATIVAN) injection 1 mg  1 mg Intravenous Q6H PRN Ivor Costa, MD      . multivitamin with minerals tablet 1 tablet  1 tablet Oral Daily Ivor Costa, MD   1 tablet at 10/13/18 1022  . ondansetron (ZOFRAN) tablet 4 mg  4 mg Oral Q6H PRN Ivor Costa, MD       Or  . ondansetron [facility]) injection 4 mg  4 mg Intravenous Q6H PRN Ivor Costa, MD      . rosuvastatin (CRESTOR) tablet 10 mg  10 mg Oral QPM Ivor Costa, MD   10 mg at 10/12/18 1700  . sodium chloride flush (NS) 0.9 % injection 3 mL  3 mL Intravenous Once Ivor Costa, MD      . thiamine (VITAMIN B-1) tablet 100 mg  100 mg Oral Daily Ivor Costa, MD   100 mg at 10/13/18 1022   Or  . thiamine (B-1)  injection 100 mg  100 mg Intravenous Daily Ivor Costa, MD      . vitamin C (ASCORBIC ACID) tablet 500 mg  500 mg Oral QPM Ivor Costa, MD   500 mg at 10/12/18 1700    Musculoskeletal: Strength & Muscle Tone: Spontaneously moves all 4 extremities. Gait & Station: UTA since patient is lying in bed. Patient leans: N/A  Psychiatric Specialty Exam: Physical Exam  Nursing note and vitals reviewed. Constitutional: He is oriented to person, place, and time. He appears well-developed and well-nourished.  HENT:  Head: Normocephalic and atraumatic.  Neck: Normal range of motion.  Respiratory: Effort normal.  Musculoskeletal: Normal range of motion.  Neurological: He is alert and oriented to person, place, and time.  Psychiatric: He has a  normal mood and affect. His speech is normal and behavior is normal. Judgment and thought content normal. Cognition and memory are normal.    Review of Systems  Cardiovascular: Negative for chest pain.  Gastrointestinal: Negative for abdominal pain, nausea and vomiting.  Psychiatric/Behavioral: Negative for depression, hallucinations, substance abuse and suicidal ideas.  All other systems reviewed and are negative.   Blood pressure 138/61, pulse 81, temperature 97.8 F (36.6 C), resp. rate 18, height 5' 8.5" (1.74 m), weight 84.8 kg, SpO2 98 %.Body mass index is 28.02 kg/m.  General Appearance: Fairly Groomed, obese, elderly, Caucasian male, wearing a hospital gown with unshaved face who is lying in bed. NAD.   Eye Contact:  Good  Speech:  Clear and Coherent and Slow  Volume:  Normal  Mood:  Euthymic  Affect:  Appropriate and Congruent  Thought Process:  Goal Directed, Linear and Descriptions of Associations: Intact  Orientation:  Full (Time, Place, and Person)  Thought Content:  Logical  Suicidal Thoughts:  No  Homicidal Thoughts:  No  Memory:  Immediate;   Fair Recent;   Fair Remote;   Fair  Judgement:  Poor  Insight:  Poor  Psychomotor Activity:   Normal  Concentration:  Concentration: Good and Attention Span: Good  Recall:  Good  Fund of Knowledge:  Fair  Language:  Good  Akathisia:  No  Handed:  Right  AIMS (if indicated):   N/A  Assets:  Communication Skills Housing Intimacy Resilience Social Support  ADL's:  Impaired  Cognition:  WNL  Sleep:   N/A   Assessment:  Daniel [name] is a 85 y.o. male who was admitted with acute metabolic encephalopathy with gait instability of unknown etiology. Patient does not demonstrate capacity to refuse SNF placement. He lacks an understanding of safety awareness. He denies problems with ambulation and is unable to provide ways that he can keep himself safe at home in the event of falls/accidents. He will need an authorized representative to help him make decisions regarding SNF placement.   Treatment Plan Summary: -Patient lacks capacity to refuse SNF placement.  -EKG reviewed and QTc 483 on 7/21. Please closely monitor when starting or increasing QTc prolonging agents.  -Psychiatry will sign off on patient at this time. Please consult psychiatry again as needed.    Disposition: Patient does not meet criteria for psychiatric inpatient admission.  This service was provided via telemedicine using a 2-way, interactive audio and video technology.  Names of all persons participating in this telemedicine service and their role in this encounter. Name: Buford Dresser, DO Role: Psychiatrist   Name: Daniel [name]  Role: Patient     Faythe Dingwall, DO 10/13/2018 11:29 AM

## 2018-10-14 LAB — GLUCOSE, CAPILLARY
Glucose-Capillary: 107 mg/dL — ABNORMAL HIGH (ref 70–99)
Glucose-Capillary: 135 mg/dL — ABNORMAL HIGH (ref 70–99)
Glucose-Capillary: 203 mg/dL — ABNORMAL HIGH (ref 70–99)
Glucose-Capillary: 194 mg/dL — ABNORMAL HIGH (ref 70–99)

## 2018-10-14 MED ORDER — ALPRAZOLAM 0.25 MG PO TABS: 0.2500 mg | ORAL_TABLET | Freq: Two times a day (BID) | ORAL | 0 refills | Status: DC | PRN

## 2018-10-14 MED ORDER — FOLIC ACID 1 MG PO TABS: 1.0000 mg | ORAL_TABLET | Freq: Every day | ORAL | Status: DC

## 2018-10-14 MED ORDER — THIAMINE HCL 100 MG PO TABS: 100.0000 mg | ORAL_TABLET | Freq: Every day | ORAL | Status: DC

## 2018-10-14 NOTE — Progress Notes (Signed)
PROGRESS NOTE    Daniel [name]  VEH:[id number] DOB: Nov 07, 1933 DOA: 10/11/2018 PCP: Jani Gravel, MD   Brief Narrative:  HPI on 10/12/2018 by Dr. Ivor Costa Daniel [name] is a 85 y.o. male with medical history significant of hypertension, hyperlipidemia, diabetes mellitus, alcohol abuse, seizure, CAD, stent placement, prostate cancer (radiation seeds placement), CKD stage III, who presents with generalized weakness, gait instability and AMS.  Per his wife (I called his wife by phone),  Pt has been having generalized weakness and gait instability in the past several days.  His wife is very concerned that the patient may fall, but fortunately patient did not fall. The patient normally uses a walker, but was unable to ambulate with this. Wife states that pt normally can tell you the month and year, but is not able to do so today. The patient does not always know the day.  Patient does not have facial droop or slurred speech.  Patient does not have any chest pain, shortness of breath, fever or chills.  He has some mild dry cough.  No nausea vomiting, diarrhea, abdominal pain, symptoms of UTI.  Interim history Admitted for AMS, no source found. Pending SNF. Assessment & Plan   Acute metabolic encephalopathy with gait instability -Etiology unclear. Currently alert and oriented to self and place -CT head: No acute intercranial normality -MRI brain: No acute or reversible finding -COVID negative -UA and CXR unremarkable for infection -Ammonia level 29, TSH 2.253, vitamin B12 1089 -PT, OT consulted. PT recommended SNF. OT recommended HH. -Discussed with patient- he would like to go home. Discussed this with his wife, who referred [place] to his son. Not sure patient fully understands how much are he needs and his wife recently had surgery. Discharging to home is not the safest or best option for the patient.  -Wife tells me that he has not missed any of medications doses. -Psychiatry consulted for  assessment of medical capacity- patient lacks capacity to refuse SNF placement.   Alcohol dependence -Continue CIWA protocol -no signs of withdrawal  Diabetes mellitus, type II -Continue insulin sliding scale with CBG monitoring  CAD with mildly elevated troponin and BNP -Currently no complaints of chest pain or shortness of breath -Echocardiogram 06/09/2017: EF 60-65% -Continue to trend troponin -BNP 509.1 -Cardiology consulted and appreciated by admitted physician -Continue aspirin, statin -patient started lasix  -Echocardiogram EF 45-50%. LV diastolic parameters with pseudonormalization  -Cardiology compared studies to previous year and found that they were likely similar.  Global hypokinesis.  No indication for further inpatient work-up.  Patient to follow-up with cardiology as an outpatient.  Seizure disorder -Continue precautions -Continue keppra and ativan PRN  Chronic kidney disease, Stage III -Stable  Hyperlipidemia -Continue statin  DVT Prophylaxis  lovenox  Code Status: DNR  Family Communication: None at bedside.  Disposition Plan: Admitted. Pending SNF  Consultants Cardiology Psychiatry   Procedures  Echocardiogram  Antibiotics   Anti-infectives (From admission, onward)   None      Subjective:   Caliph Furgerson seen and examined today. No complaints today. Denies chest pain, shortness of breath. More interested in "flirting".  Objective:   Vitals:   10/13/18 1424 10/13/18 2100 10/13/18 2254 10/13/18 2323  BP: 138/70  (!) 101/43 101/64  Pulse: 74  72   Resp: 19  20   Temp: 97.6 F (36.4 C) 98.9 F (37.2 C) 98.9 F (37.2 C)   TempSrc: Oral Oral Oral   SpO2: 99%  100%   Weight:  Height:        Intake/Output Summary (Last 24 hours) at 10/14/2018 1546 Last data filed at 10/14/2018 1256 Gross per 24 hour  Intake 1300 ml  Output 1000 ml  Net 300 ml   Filed Weights   10/11/18 1951  Weight: 84.8 kg   Exam  General: Well developed,  well nourished, NAD  HEENT: NCAT, mucous membranes moist.   Cardiovascular: S1 S2 auscultated, soft SEM, RRR  Respiratory: Clear to auscultation bilaterally with equal chest rise  Abdomen: Soft, nontender, nondistended, + bowel sounds  Extremities: warm dry without cyanosis clubbing  Neuro: AAOx2, nonfocal  Psych: Appropriate mood and affect   Data Reviewed: I have personally reviewed following labs and imaging studies  CBC: Recent Labs  Lab 10/11/18 1810 10/12/18 0222 10/13/18 0251  WBC 7.1 7.3 7.6  HGB 12.5* 12.5* 13.6  HCT 37.0* 37.7* 39.7  MCV 97.6 97.4 94.3  PLT 124* 133* 086*   Basic Metabolic Panel: Recent Labs  Lab 10/11/18 1810 10/12/18 0222 10/13/18 0251  NA 132* 138 137  K 4.0 4.9 3.9  CL 107 111 103  CO2 18* 22 23  GLUCOSE 163* 103* 130*  BUN 22 18 19   CREATININE 1.41* 1.33* 1.37*  CALCIUM 8.2* 8.6* 9.2   GFR: Estimated Creatinine Clearance: 44.5 mL/min (A) (by C-G formula based on SCr of 1.37 mg/dL (H)). Liver Function Tests: Recent Labs  Lab 10/11/18 1810  AST 75*  ALT 42  ALKPHOS 92  BILITOT 0.8  PROT 5.3*  ALBUMIN 2.6*   No results for input(s): LIPASE, AMYLASE in the last 168 hours. Recent Labs  Lab 10/12/18 0222  AMMONIA 29   Coagulation Profile: No results for input(s): INR, PROTIME in the last 168 hours. Cardiac Enzymes: No results for input(s): CKTOTAL, CKMB, CKMBINDEX, TROPONINI in the last 168 hours. BNP (last 3 results) No results for input(s): PROBNP in the last 8760 hours. HbA1C: Recent Labs    10/13/18 0251  HGBA1C 6.6*   CBG: Recent Labs  Lab 10/13/18 1203 10/13/18 1701 10/13/18 2303 10/14/18 0754 10/14/18 1153  GLUCAP 239* 246* 122* 107* 135*   Lipid Profile: Recent Labs    10/13/18 0251  CHOL 153  HDL 48  LDLCALC 89  TRIG 78  CHOLHDL 3.2   Thyroid Function Tests: Recent Labs    10/12/18 0726  TSH 2.253   Anemia Panel: Recent Labs    10/12/18 0726  VITAMINB12 1,089*   Urine  analysis:    Component Value Date/Time   COLORURINE YELLOW 10/12/2018 0103   APPEARANCEUR CLEAR 10/12/2018 0103   LABSPEC 1.020 10/12/2018 0103   PHURINE 7.0 10/12/2018 0103   GLUCOSEU NEGATIVE 10/12/2018 0103   HGBUR NEGATIVE 10/12/2018 0103   BILIRUBINUR NEGATIVE 10/12/2018 0103   KETONESUR NEGATIVE 10/12/2018 0103   PROTEINUR 100 (A) 10/12/2018 0103   UROBILINOGEN 1.0 04/17/2013 1014   NITRITE NEGATIVE 10/12/2018 0103   LEUKOCYTESUR NEGATIVE 10/12/2018 0103   Sepsis Labs: @LABRCNTIP (procalcitonin:4,lacticidven:4)  ) Recent Results (from the past 240 hour(s))  SARS Coronavirus 2 (CEPHEID - Performed in [place] hospital lab), Hosp Order     Status: None   Collection Time: 10/11/18 11:49 PM   Specimen: Nasopharyngeal Swab  Result Value Ref Range Status   SARS Coronavirus 2 NEGATIVE NEGATIVE Final    Comment: (NOTE) If result is NEGATIVE SARS-CoV-2 target nucleic acids are NOT DETECTED. The SARS-CoV-2 RNA is generally detectable in upper and lower  respiratory specimens during the acute phase of infection. The lowest  concentration of SARS-CoV-2 viral copies this assay can detect is 250  copies / mL. A negative result does not preclude SARS-CoV-2 infection  and should not be used as the sole basis for treatment or other  patient management decisions.  A negative result may occur with  improper specimen collection / handling, submission of specimen other  than nasopharyngeal swab, presence of viral mutation(s) within the  areas targeted by this assay, and inadequate number of viral copies  (<250 copies / mL). A negative result must be combined with clinical  observations, patient history, and epidemiological information. If result is POSITIVE SARS-CoV-2 target nucleic acids are DETECTED. The SARS-CoV-2 RNA is generally detectable in upper and lower  respiratory specimens dur ing the acute phase of infection.  Positive  results are indicative of active infection with  SARS-CoV-2.  Clinical  correlation with patient history and other diagnostic information is  necessary to determine patient infection status.  Positive results do  not rule out bacterial infection or co-infection with other viruses. If result is PRESUMPTIVE POSTIVE SARS-CoV-2 nucleic acids MAY BE PRESENT.   A presumptive positive result was obtained on the submitted specimen  and confirmed on repeat testing.  While 2019 novel coronavirus  (SARS-CoV-2) nucleic acids may be present in the submitted sample  additional confirmatory testing may be necessary for epidemiological  and / or clinical management purposes  to differentiate between  SARS-CoV-2 and other Sarbecovirus currently known to infect humans.  If clinically indicated additional testing with an alternate test  methodology [phone]) is advised. The SARS-CoV-2 RNA is generally  detectable in upper and lower respiratory sp ecimens during the acute  phase of infection. The expected result is Negative. Fact Sheet for Patients:  StrictlyIdeas.no Fact Sheet for Healthcare Providers: BankingDealers.co.za This test is not yet approved or cleared by the [place] FDA and has been authorized for detection and/or diagnosis of SARS-CoV-2 by FDA under an Emergency Use Authorization (EUA).  This EUA will remain in effect (meaning this test can be used) for the duration of the COVID-19 declaration under Section 564(b)(1) of the Act, 21 U.S.C. section 360bbb-3(b)(1), unless the authorization is terminated or revoked sooner. Performed at [facility], [place] [address], [place], [place] [address]       Radiology Studies: No results found.   Scheduled Meds: . aspirin EC  81 mg Oral Daily  . enoxaparin (LOVENOX) injection  40 mg Subcutaneous Daily  . finasteride  5 mg Oral Daily  . folic acid  1 mg Oral Daily  . furosemide  20 mg Intravenous Daily  . insulin aspart  0-5 Units  Subcutaneous QHS  . insulin aspart  0-9 Units Subcutaneous TID WC  . latanoprost  1 drop Both Eyes QHS  . levETIRAcetam  500 mg Oral BID  . LORazepam  0-4 mg Intravenous Q12H  . multivitamin with minerals  1 tablet Oral Daily  . rosuvastatin  10 mg Oral QPM  . sodium chloride flush  3 mL Intravenous Once  . thiamine  100 mg Oral Daily   Or  . thiamine  100 mg Intravenous Daily  . vitamin C  500 mg Oral QPM   Continuous Infusions:   LOS: 1 day   Time Spent in minutes   30 minutes  Chawn Spraggins D.O. on 10/14/2018 at 3:46 PM  Between 7am to 7pm - Please see pager noted on [email or web address]  After 7pm go to [email or web address]  And look for the night coverage person covering  for me after hours  [facility] Office  [phone]

## 2018-10-14 NOTE — Discharge Summary (Signed)
Physician Discharge Summary  ELSIE SAKUMA MCN:[id number] DOB: Apr 04, 1933 DOA: 10/11/2018  PCP: Jani Gravel, MD  Admit date: 10/11/2018 Discharge date: 10/17/2018  Time spent: 45 minutes  Recommendations for Outpatient Follow-up:  Patient will be discharged to skilled nursing facility, continue physical and occupational therapy.  Patient will need to follow up with primary care provider within one week of discharge.  Patient should continue medications as prescribed.  Patient should follow a heart healthy diet.   Discharge Diagnoses:  Acute metabolic encephalopathy with gait instability Alcohol dependence Diabetes mellitus, type II CAD with mildly elevated troponin and BNP Seizure disorder Chronic kidney disease, Stage III Hyperlipidemia  Discharge Condition: Stable  Diet recommendation: heart healthy  Filed Weights   10/11/18 1951  Weight: 84.8 kg    History of present illness:  on 10/12/2018 by Dr. Bernadene Person R Bishopis a 85 y.o.malewith medical history significant ofhypertension, hyperlipidemia, diabetes mellitus, alcohol abuse, seizure, CAD, stent placement, prostate cancer (radiation seeds placement), CKD stage III, who presents with generalized weakness, gait instability and AMS.  Per his wife (I called his wife by phone), Pt has been having generalized weakness and gait instability in the past several days.His wife is very concerned that the patient may fall, butfortunately patient did not fall.The patient normally uses a walker,but was unable to ambulate with this.Wife states that ptnormally can tell you the month andyear,but is not able to do so today. The patient does not always know the day.Patient does not have facial droop or slurred speech. Patient does not have any chest pain, shortness of breath, fever or chills. He has some mild dry cough.No nausea vomiting, diarrhea, abdominal pain, symptoms of UTI.  Hospital Course:  Acute metabolic  encephalopathy with gait instability -Etiology unclear. Currently alert and oriented to self and place -CT head: No acute intercranial normality -MRI brain: No acute or reversible finding -COVID negative x2 -UA and CXR unremarkable for infection -Ammonia level 29, TSH 2.253, vitamin B12 1089 -PT, OT consulted. PT recommended SNF. OT recommended HH. -Discussed with patient- he would like to go home. Discussed this with his wife, who referred [place] to his son. Not sure patient fully understands how much are he needs and his wife recently had surgery. Discharging to home is not the safest or best option for the patient.  -Wife tells me that he has not missed any of medications doses. -Psychiatry consulted for assessment of medical capacity- patient lacks capacity to refuse SNF placement.   Alcohol dependence -Continue CIWA protocol -no signs of withdrawal  Diabetes mellitus, type II -Continue Toujeo and Humalog   CAD with mildly elevated troponin and BNP -Currently no complaints of chest pain or shortness of breath -Echocardiogram 06/09/2017: EF 60-65% -Continue to trend troponin -BNP 509.1 -Cardiology consulted and appreciated by admitted physician -Continue aspirin, statin -patient started lasix  -Echocardiogram EF 45-50%. LV diastolic parameters with pseudonormalization  -Cardiology compared studies to previous year and found that they were likely similar.  Global hypokinesis.  No indication for further inpatient work-up.  Patient to follow-up with cardiology as an outpatient.  Seizure disorder -Continue precautions -Continue keppra and Xanax PRN  Chronic kidney disease, Stage III -Stable  Hyperlipidemia -Continue statin  Consultants Cardiology Psychiatry   Procedures  Echocardiogram  Code status: DNR  Discharge Exam: Vitals:   10/17/18 0557 10/17/18 1459  BP: (!) 104/41 113/62  Pulse: 80 71  Resp: 18 18  Temp: (!) 97.5 F (36.4 C) 98.2 F (36.8 C)  SpO2:  99% 97%     General: Well developed, well nourished, NAD  HEENT: NCAT, mucous membranes moist.  Cardiovascular: S1 S2 auscultated, soft SEM, RRR  Respiratory: Clear to auscultation bilaterally with equal chest rise  Abdomen: Soft, nontender, nondistended, + bowel sounds  Extremities: warm dry without cyanosis clubbing  Neuro: AAOx2 (self, place), nonfocal  Psych: Appropriate mood and affect, lacks insight into medical needs  Discharge Instructions Discharge Instructions    Discharge instructions   Complete by: As directed    Patient will be discharged to skilled nursing facility, continue physical and occupational therapy.  Patient will need to follow up with primary care provider within one week of discharge.  Patient should continue medications as prescribed.  Patient should follow a heart healthy diet.   Discharge instructions   Complete by: As directed    Patient will be discharged to skilled nursing facility, continue physical and occupational therapy.  Patient will need to follow up with primary care provider within one week of discharge.  Patient should continue medications as prescribed.  Patient should follow a heart healthy diet.     Allergies as of 10/17/2018   No Known Allergies     Medication List    TAKE these medications   ALPRAZolam 0.25 MG tablet Commonly known as: XANAX Take 1 tablet (0.25 mg total) by mouth 2 (two) times daily as needed for anxiety.   aspirin 81 MG tablet Take 81 mg by mouth daily.   finasteride 5 MG tablet Commonly known as: PROSCAR Take 5 mg by mouth daily.   folic acid 1 MG tablet Commonly known as: FOLVITE Take 1 tablet (1 mg total) by mouth daily.   insulin lispro 100 UNIT/ML KwikPen Commonly known as: HUMALOG Inject 11 Units into the skin 2 (two) times a day.   latanoprost 0.005 % ophthalmic solution Commonly known as: XALATAN Place 1 drop into both eyes at bedtime.   levETIRAcetam 500 MG tablet Commonly known as:  KEPPRA Take 1 tablet (500 mg total) by mouth 2 (two) times daily.   magnesium oxide 400 MG tablet Commonly known as: MAG-OX Take 400 mg by mouth daily.   multivitamin tablet Take 1 tablet by mouth every evening.   rosuvastatin 20 MG tablet Commonly known as: CRESTOR Take 10 mg by mouth every evening.   thiamine 100 MG tablet Take 1 tablet (100 mg total) by mouth daily.   Toujeo SoloStar 300 UNIT/ML Sopn Generic drug: Insulin Glargine (1 Unit Dial) Inject 18 Units into the skin at bedtime.   vitamin C 500 MG tablet Commonly known as: ASCORBIC ACID Take 500 mg by mouth every evening.      No Known Allergies  Contact information for follow-up providers    Jani Gravel, MD. Schedule an appointment as soon as possible for a visit in 1 week(s).   Specialty: Internal Medicine Why: Hospital follow up Contact information: [place] [address] [phone]        Sanda Klein, MD .   Specialty: Cardiology Contact information: [address] [place] [address] [phone]            Contact information for after-discharge care    Destination    [facility]erred [facility] .   Service: Skilled Nursing Contact information: [place] [phone]                   The results of significant diagnostics from this hospitalization (  including imaging, microbiology, ancillary and laboratory) are listed below for reference.    Significant Diagnostic Studies: Ct Head Wo Contrast  Result Date: 10/11/2018 CLINICAL DATA:  Altered level of consciousness (LOC), unexplained. Increasing lethargy and generalized weakness. EXAM: CT HEAD WITHOUT CONTRAST TECHNIQUE: Contiguous axial images were obtained from the base of the skull through the vertex without intravenous contrast. COMPARISON:  Head CT 01/23/2018 FINDINGS: Brain: No evidence of acute infarction, hemorrhage, hydrocephalus,  extra-axial collection or mass lesion/mass effect. Advanced atrophy, unchanged from prior exam. Moderate chronic small vessel ischemia. Vascular: Atherosclerosis of skullbase vasculature without hyperdense vessel or abnormal calcification. Skull: No fracture or focal lesion. Sinuses/Orbits: Chronic fluid level in mucosal thickening in left side of sphenoid sinus. Chronic mucosal thickening of the right maxillary sinus and ethmoid air cells. Other: None. IMPRESSION: No acute intracranial abnormality. Stable atrophy and chronic small vessel ischemia. Electronically Signed   By: Keith Rake M.D.   On: 10/11/2018 21:34   Mr Brain Wo Contrast (neuro Protocol)  Result Date: 10/12/2018 CLINICAL DATA:  Altered level of consciousness EXAM: MRI HEAD WITHOUT CONTRAST TECHNIQUE: Multiplanar, multiecho pulse sequences of the brain and surrounding structures were obtained without intravenous contrast. COMPARISON:  CT from yesterday FINDINGS: Brain: No acute infarction, hemorrhage, hydrocephalus, extra-axial collection or mass lesion. Advanced generalized brain atrophy. Periventricular chronic small vessel ischemic change, mild for age. Small remote right cerebellar infarcts. Vascular: Major flow voids are preserved Skull and upper cervical spine: Negative for marrow lesion Sinuses/Orbits: Bilateral cataract resection and left scleral banding. Incidental mucosal thickening in the left sphenoid and right maxillary sinuses with right maxillary sinus atelectasis. IMPRESSION: 1. No acute or reversible finding. 2. Advanced brain atrophy. Electronically Signed   By: Monte Fantasia M.D.   On: 10/12/2018 04:49   Dg Chest Port 1 View  Result Date: 10/12/2018 CLINICAL DATA:  MRI clearance. EXAM: PORTABLE CHEST 1 VIEW COMPARISON:  Chest radiograph dated 01/23/2018 FINDINGS: Shallow inspiration with bibasilar atelectasis. No new consolidative changes. There is no pleural effusion or pneumothorax. Stable cardiomegaly.  Atherosclerotic calcification of the aortic arch. Several tiny linear radiopaque densities over the left hemidiaphragm, likely surgical clips. No other radiopaque foreign object identified. IMPRESSION: No acute findings.  No interval change. Electronically Signed   By: Anner Crete M.D.   On: 10/12/2018 00:43    Microbiology: Recent Results (from the past 240 hour(s))  SARS Coronavirus 2 (CEPHEID - Performed in [facility] hospital lab), Hosp Order     Status: None   Collection Time: 10/11/18 11:49 PM   Specimen: Nasopharyngeal Swab  Result Value Ref Range Status   SARS Coronavirus 2 NEGATIVE NEGATIVE Final    Comment: (NOTE) If result is NEGATIVE SARS-CoV-2 target nucleic acids are NOT DETECTED. The SARS-CoV-2 RNA is generally detectable in upper and lower  respiratory specimens during the acute phase of infection. The lowest  concentration of SARS-CoV-2 viral copies this assay can detect is 250  copies / mL. A negative result does not preclude SARS-CoV-2 infection  and should not be used as the sole basis for treatment or other  patient management decisions.  A negative result may occur with  improper specimen collection / handling, submission of specimen other  than nasopharyngeal swab, presence of viral mutation(s) within the  areas targeted by this assay, and inadequate number of viral copies  (<250 copies / mL). A negative result must be combined with clinical  observations, patient history, and epidemiological information. If result is POSITIVE SARS-CoV-2 target nucleic acids are  DETECTED. The SARS-CoV-2 RNA is generally detectable in upper and lower  respiratory specimens dur ing the acute phase of infection.  Positive  results are indicative of active infection with SARS-CoV-2.  Clinical  correlation with patient history and other diagnostic information is  necessary to determine patient infection status.  Positive results do  not rule out bacterial infection or  co-infection with other viruses. If result is PRESUMPTIVE POSTIVE SARS-CoV-2 nucleic acids MAY BE PRESENT.   A presumptive positive result was obtained on the submitted specimen  and confirmed on repeat testing.  While 2019 novel coronavirus  (SARS-CoV-2) nucleic acids may be present in the submitted sample  additional confirmatory testing may be necessary for epidemiological  and / or clinical management purposes  to differentiate between  SARS-CoV-2 and other Sarbecovirus currently known to infect humans.  If clinically indicated additional testing with an alternate test  methodology [phone]) is advised. The SARS-CoV-2 RNA is generally  detectable in upper and lower respiratory sp ecimens during the acute  phase of infection. The expected result is Negative. Fact Sheet for Patients:  StrictlyIdeas.no Fact Sheet for Healthcare Providers: BankingDealers.co.za This test is not yet approved or cleared by the [place] FDA and has been authorized for detection and/or diagnosis of SARS-CoV-2 by FDA under an Emergency Use Authorization (EUA).  This EUA will remain in effect (meaning this test can be used) for the duration of the COVID-19 declaration under Section 564(b)(1) of the Act, 21 U.S.C. section 360bbb-3(b)(1), unless the authorization is terminated or revoked sooner. Performed at [facility], [place] [address] [place], [place] [address]   SARS Coronavirus 2 (CEPHEID - Performed in [facility] hospital lab), Hosp Order     Status: None   Collection Time: 10/17/18  1:44 PM   Specimen: Nasopharyngeal Swab  Result Value Ref Range Status   SARS Coronavirus 2 NEGATIVE NEGATIVE Final    Comment: (NOTE) If result is NEGATIVE SARS-CoV-2 target nucleic acids are NOT DETECTED. The SARS-CoV-2 RNA is generally detectable in upper and lower  respiratory specimens during the acute phase of infection. The lowest  concentration of  SARS-CoV-2 viral copies this assay can detect is 250  copies / mL. A negative result does not preclude SARS-CoV-2 infection  and should not be used as the sole basis for treatment or other  patient management decisions.  A negative result may occur with  improper specimen collection / handling, submission of specimen other  than nasopharyngeal swab, presence of viral mutation(s) within the  areas targeted by this assay, and inadequate number of viral copies  (<250 copies / mL). A negative result must be combined with clinical  observations, patient history, and epidemiological information. If result is POSITIVE SARS-CoV-2 target nucleic acids are DETECTED. The SARS-CoV-2 RNA is generally detectable in upper and lower  respiratory specimens dur ing the acute phase of infection.  Positive  results are indicative of active infection with SARS-CoV-2.  Clinical  correlation with patient history and other diagnostic information is  necessary to determine patient infection status.  Positive results do  not rule out bacterial infection or co-infection with other viruses. If result is PRESUMPTIVE POSTIVE SARS-CoV-2 nucleic acids MAY BE PRESENT.   A presumptive positive result was obtained on the submitted specimen  and confirmed on repeat testing.  While 2019 novel coronavirus  (SARS-CoV-2) nucleic acids may be present in the submitted sample  additional confirmatory testing may be necessary for epidemiological  and / or clinical management purposes  to differentiate between  SARS-CoV-2 and other Sarbecovirus currently known to infect humans.  If clinically indicated additional testing with an alternate test  methodology [phone]) is advised. The SARS-CoV-2 RNA is generally  detectable in upper and lower respiratory sp ecimens during the acute  phase of infection. The expected result is Negative. Fact Sheet for Patients:  StrictlyIdeas.no Fact Sheet for Healthcare  Providers: BankingDealers.co.za This test is not yet approved or cleared by the [place] FDA and has been authorized for detection and/or diagnosis of SARS-CoV-2 by FDA under an Emergency Use Authorization (EUA).  This EUA will remain in effect (meaning this test can be used) for the duration of the COVID-19 declaration under Section 564(b)(1) of the Act, 21 U.S.C. section 360bbb-3(b)(1), unless the authorization is terminated or revoked sooner. Performed at [facility], [place] [address] [place], [place] [address]      Labs: Basic Metabolic Panel: Recent Labs  Lab 10/11/18 1810 10/12/18 0222 10/13/18 0251  NA 132* 138 137  K 4.0 4.9 3.9  CL 107 111 103  CO2 18* 22 23  GLUCOSE 163* 103* 130*  BUN 22 18 19   CREATININE 1.41* 1.33* 1.37*  CALCIUM 8.2* 8.6* 9.2   Liver Function Tests: Recent Labs  Lab 10/11/18 1810  AST 75*  ALT 42  ALKPHOS 92  BILITOT 0.8  PROT 5.3*  ALBUMIN 2.6*   No results for input(s): LIPASE, AMYLASE in the last 168 hours. Recent Labs  Lab 10/12/18 0222  AMMONIA 29   CBC: Recent Labs  Lab 10/11/18 1810 10/12/18 0222 10/13/18 0251  WBC 7.1 7.3 7.6  HGB 12.5* 12.5* 13.6  HCT 37.0* 37.7* 39.7  MCV 97.6 97.4 94.3  PLT 124* 133* 146*   Cardiac Enzymes: No results for input(s): CKTOTAL, CKMB, CKMBINDEX, TROPONINI in the last 168 hours. BNP: BNP (last 3 results) Recent Labs    10/12/18 0222  BNP 509.1*    ProBNP (last 3 results) No results for input(s): PROBNP in the last 8760 hours.  CBG: Recent Labs  Lab 10/16/18 1207 10/16/18 1614 10/16/18 2129 10/17/18 0809 10/17/18 1157  GLUCAP 236* 196* 195* 139* 105*       Signed:  Wadie Liew  [facility] 10/17/2018, 3:40 PM

## 2018-10-14 NOTE — Progress Notes (Signed)
10/14/18 1628  PT Visit Information  Last PT Received On 10/14/18  Assistance Needed +1 (+2 for chair follow to progress mobility )  History of Present Illness 85 y.o. male with medical history significant of hypertension, hyperlipidemia, diabetes mellitus, alcohol abuse, seizure, CAD, stent placement, prostate cancer (radiation seeds placement), CKD stage III, who presents with generalized weakness, gait instability and AMS.  Subjective Data  Patient Stated Goal to go home  Precautions  Precautions Fall  Restrictions  Weight Bearing Restrictions No  Pain Assessment  Pain Assessment No/denies pain  Cognition  Arousal/Alertness Awake/alert  Behavior During Therapy WFL for tasks assessed/performed  Overall Cognitive Status Impaired/Different from baseline  Area of Impairment Orientation;Following commands;Safety/judgement;Problem solving  Orientation Level Disoriented to;Time;Situation  Memory Decreased short-term memory  Following Commands Follows one step commands inconsistently;Follows one step commands with increased time  Safety/Judgement Decreased awareness of safety;Decreased awareness of deficits  Problem Solving Slow processing;Decreased initiation;Requires verbal cues;Requires tactile cues  Bed Mobility  General bed mobility comments In chair with OT upon entry   Transfers  Overall transfer level Needs assistance  Equipment used Rolling walker (2 wheeled)  Transfers Sit to/from Stand  Sit to Stand Mod assist  General transfer comment Mod A for lift assist and steadying to stand from recliner. Cues for safe hand placement.   Ambulation/Gait  Ambulation/Gait assistance Mod assist;+2 safety/equipment (chair follow )  Gait Distance (Feet) 6 Feet (X2)  Assistive device Rolling walker (2 wheeled)  Gait Pattern/deviations Step-through pattern;Decreased stride length;Shuffle  General Gait Details Pt performing 2 short distance gait trials within the room with very close  chair follow. Required mod A for steadying, cues for sequencing using RW, and cues for step height. Pt very unsteady throughout.   Gait velocity Decreased   Balance  Overall balance assessment Needs assistance  Sitting-balance support No upper extremity supported;Feet supported  Sitting balance-Leahy Scale Fair  Standing balance support Bilateral upper extremity supported  Standing balance-Leahy Scale Poor  Standing balance comment Reliant on UE and external support   PT - End of Session  Equipment Utilized During Treatment Gait belt  Activity Tolerance Patient tolerated treatment well  Patient left in chair;with call bell/phone within reach;with chair alarm set  Nurse Communication Mobility status   PT - Assessment/Plan  PT Plan Current plan remains appropriate  PT Visit Diagnosis Unsteadiness on feet (R26.81);Other abnormalities of gait and mobility (R26.89);Muscle weakness (generalized) (M62.81)  PT Frequency (ACUTE ONLY) Min 3X/week  Follow Up Recommendations SNF  PT equipment None recommended by PT  AM-PAC PT "6 Clicks" Mobility Outcome Measure (Version 2)  Help needed turning from your back to your side while in a flat bed without using bedrails? 3  Help needed moving from lying on your back to sitting on the side of a flat bed without using bedrails? 3  Help needed moving to and from a bed to a chair (including a wheelchair)? 2  Help needed standing up from a chair using your arms (e.g., wheelchair or bedside chair)? 2  Help needed to walk in hospital room? 2  Help needed climbing 3-5 steps with a railing?  1  6 Click Score 13  Consider Recommendation of Discharge To: CIR/SNF/LTACH  PT Goal Progression  Progress towards PT goals Progressing toward goals  Acute Rehab PT Goals  PT Goal Formulation With patient  Time For Goal Achievement 10/26/18  Potential to Achieve Goals Good  PT Time Calculation  PT Start Time (ACUTE ONLY) 1437  PT Stop Time (  ACUTE ONLY) 1452  PT Time  Calculation (min) (ACUTE ONLY) 15 min  PT General Charges  $$ ACUTE PT VISIT 1 Visit  PT Treatments  $Gait Training 8-22 mins   Pt progressing towards goals. Able to perform short distance gait trials, however, pt very unsteady requiring mod A with use of RW and close chair follow. Current recommendations appropriate. Will continue to follow acutely to maximize functional mobility independence and safety.   Leighton Ruff, PT, DPT  Acute Rehabilitation Services  Pager: [phone] Office: [phone]

## 2018-10-14 NOTE — Discharge Instructions (Signed)
Delirium Delirium is a state of mental confusion. It comes on quickly and causes significant changes in a person's thinking and behavior. People with delirium usually have trouble paying attention to what is going on or knowing where they are. They may become very withdrawn or very emotional and unable to sit still. They may even see or feel things that are not there (hallucinations). Delirium is a sign of a serious underlying medical condition. What are the causes? Delirium occurs when something suddenly affects the signals that the brain sends out. Brain signals can be affected by anything that puts severe stress on the body and brain and causes brain chemicals to be out of balance. The most common causes of delirium include:  Infections. These may be bacterial, viral, fungal, or protozoal.  Medicines. These include many over-the-counter and prescription medicines.  Recreational drugs.  Substance withdrawal. This occurs with sudden discontinuation of alcohol, certain medicines, or recreational drugs.  Surgery and anesthesia.  Sudden vascular events, such as stroke and brain hemorrhage.  Other brain disorders, such as migraines, tumors, seizures, and physical head trauma.  Metabolic disorders, such as kidney or liver failure.  Low blood oxygen (anoxia). This may occur with lung disease, cardiac arrest, or carbon monoxide poisoning.  Hormone imbalances (endocrinopathies), such as an overactive thyroid (hyperthyroidism) or underactive thyroid (hypothyroidism).  Vitamin deficiencies. What increases the risk? The following factors may make someone more likely to develop this condition.  Being a child.  Being an older person.  Living alone.  Having vision loss or hearing loss.  Having an existing brain disease, such as dementia.  Having long-lasting (chronic) medical conditions, such as heart disease.  Being hospitalized for long periods of time. What are the signs or  symptoms? Delirium starts with a sudden change in a person's thinking or behavior. Symptoms include:  Not being able to stay awake (drowsiness) or pay attention.  Being confused about places, time, and people.  Forgetfulness.  Having extreme energy levels. These may be low or high.  Changes in sleep patterns.  Extreme mood swings, such as sudden anger or anxiety.  Focusing on things or ideas that are not important.  Rambling and senseless talking.  Difficulty speaking, understanding speech, or both.  Hallucinations.  Tremor or unsteady gait. Symptoms come and go (fluctuate) over time, and they are often worse at the end of the day. How is this diagnosed? People with delirium may not realize that they have the condition. Often, a family member or health care provider is the first person to notice the changes. This condition may be diagnosed based on a physical exam, health history, and tests.  The health care provider will obtain a detailed history. This may include questions about: ? Current symptoms. ? Medical issues. ? Medicines. ? Recreational drug use.  The health care provider will perform a mental status examination by: ? Asking questions to check for confusion. ? Watching for abnormal behavior.  The health care provider may also order lab tests or additional studies to determine the cause of the delirium. How is this treated? Treatment of delirium depends on the cause and severity. Delirium usually goes away within days or weeks of treating the underlying cause. In the meantime, do not leave the person alone because he or she may accidentally cause self-harm. This condition may be treated with supportive care, such as:  Increased light during the day and decreased light at night.  Low noise level.  Uninterrupted sleep.  A regular daily schedule.  Clocks and calendars to help with orientation.  Familiar objects, including the person's pictures and  clothing.  Frequent visits from familiar family and friends.  A healthy diet.  Gentle exercise. In more severe cases of delirium, medicine may be prescribed to help the person keep calm and think more clearly. Follow these instructions at home:  Continue supportive care as told by a health care provider.  Over-the-counter and prescription medicines should be taken only as told by a health care provider.  Ask a health care provider before using herbs or supplements.  Do not use alcohol or recreational drugs.  Keep all follow-up visits as told by a health care provider. This is important. Contact a health care provider if:  Symptoms do not get better or they become worse.  New symptoms of delirium develop.  Caring for the person at home does not seem safe.  Eating, drinking, or communicating stops.  There are side effects of medicines, such as changes in sleep patterns, dizziness, weight gain, restlessness, movement changes, or tremors. Get help right away if:  Serious thoughts occur about self-harm or about hurting others.  There are serious side effects of medicine, such as: ? Swelling of the face, lips, tongue, or throat. ? Fever, confusion, muscle spasms, or seizures. Summary  Delirium is a state of mental confusion. It comes on quickly and causes significant changes in a person's thinking and behavior.  Delirium is a sign of a serious underlying medical condition.  Certain medical conditions or a long hospital stay may increase the risk of developing delirium.  Treatment of delirium involves treating the underlying cause and providing supportive treatments, such as a calm and familiar environment. This information is not intended to replace advice given to you by your health care provider. Make sure you discuss any questions you have with your health care provider. Document Released: 12/02/2011 Document Revised: 10/28/2017 Document Reviewed: 10/28/2017 Elsevier  Patient Education  2020 [employer].

## 2018-10-14 NOTE — Progress Notes (Signed)
Occupational Therapy Treatment Patient Details Name: Daniel [name] MRN: [id number] DOB: 08/13/1933 Today's Date: 10/14/2018    History of present illness 85 y.o. male with medical history significant of hypertension, hyperlipidemia, diabetes mellitus, alcohol abuse, seizure, CAD, stent placement, prostate cancer (radiation seeds placement), CKD stage III, who presents with generalized weakness, gait instability and AMS.   OT comments  Pt was very pleasant and happy to work with therapy. He presents with cognitive deficits, including difficulty problem solving and increased time to follow one step directions.  Pt is disoriented to time and situation. Required min assist to don new gown (had spilled some food on old gown during lunch) and mod assist to don socks while sitting EOB. Mod assist to stand and pivot from bed to recliner with max directional cues for use of RW and hand placement. Recommending SNF for discharge planning due to level of assist pt requires with ADLs and mobility. Will continue to follow acutely.  Follow Up Recommendations  SNF;Supervision/Assistance - 24 hour    Equipment Recommendations  Other (comment)(defer to next venue)    Recommendations for Other Services      Precautions / Restrictions Precautions Precautions: Fall       Mobility Bed Mobility Overal bed mobility: Needs Assistance Bed Mobility: Supine to Sit     Supine to sit: Mod assist     General bed mobility comments: Assist to elevate trunk OOB. Max cues for sequencing and hand placement.  Transfers Overall transfer level: Needs assistance Equipment used: Rolling walker (2 wheeled) Transfers: Sit to/from [facility] Sit to Stand: Mod assist Stand pivot transfers: Mod assist       General transfer comment: Mod assist to power up from bed. Max cue for hand placement to stand and to place hands on RW once in standing (attempts to reach for furniture and therapist). Assist  to turn RW and to control descent to recliner.    Balance Overall balance assessment: Needs assistance Sitting-balance support: No upper extremity supported;Feet supported Sitting balance-Leahy Scale: Fair     Standing balance support: Bilateral upper extremity supported Standing balance-Leahy Scale: Poor                             ADL either performed or assessed with clinical judgement   ADL Overall ADL's : Needs assistance/impaired Eating/Feeding: Independent;Bed level               Upper Body Dressing : Minimal assistance;Sitting   Lower Body Dressing: Moderate assistance;Sitting/lateral leans Lower Body Dressing Details (indicate cue type and reason): Assist to pull socks around toes and pt able to pull sock up over remainder of foot.  Toilet Transfer: Moderate assistance;Cueing for sequencing;Stand-pivot;RW Toilet Transfer Details (indicate cue type and reason): simulated with transfer from bed to recliner                 Vision       Perception     Praxis      Cognition Arousal/Alertness: Awake/alert Behavior During Therapy: WFL for tasks assessed/performed Overall Cognitive Status: Impaired/Different from baseline Area of Impairment: Orientation;Following commands;Safety/judgement;Problem solving                 Orientation Level: Disoriented to;Time;Situation(Pt reports month is october and today is Sunday)   Memory: Decreased short-term memory Following Commands: Follows one step commands inconsistently;Follows one step commands with increased time Safety/Judgement: Decreased awareness of safety;Decreased awareness of deficits  Problem Solving: Slow processing;Decreased initiation;Requires verbal cues;Requires tactile cues          Exercises     Shoulder Instructions       General Comments      Pertinent Vitals/ Pain       Pain Assessment: No/denies pain  Home Living                                           Prior Functioning/Environment              Frequency  Min 2X/week        Progress Toward Goals  OT Goals(current goals can now be found in the care plan section)  Progress towards OT goals: Progressing toward goals  Acute Rehab OT Goals Patient Stated Goal: to go home OT Goal Formulation: With patient Time For Goal Achievement: 10/26/18 Potential to Achieve Goals: Good ADL Goals Pt Will Perform Grooming: with supervision Pt Will Perform Upper Body Bathing: with supervision;sitting Pt Will Perform Lower Body Bathing: with supervision;sit to/from stand Pt Will Perform Upper Body Dressing: with supervision;sitting Pt Will Perform Lower Body Dressing: with supervision;sit to/from stand Pt Will Transfer to Toilet: with supervision;ambulating Pt Will Perform Toileting - Clothing Manipulation and hygiene: with supervision;sit to/from stand  Plan Discharge plan needs to be updated    Co-evaluation                 AM-PAC OT "6 Clicks" Daily Activity     Outcome Measure   Help from another person eating meals?: None Help from another person taking care of personal grooming?: A Little Help from another person toileting, which includes using toliet, bedpan, or urinal?: A Lot Help from another person bathing (including washing, rinsing, drying)?: A Lot Help from another person to put on and taking off regular upper body clothing?: A Little Help from another person to put on and taking off regular lower body clothing?: A Lot 6 Click Score: 16    End of Session Equipment Utilized During Treatment: Gait belt;Rolling walker  OT Visit Diagnosis: Unsteadiness on feet (R26.81);Muscle weakness (generalized) (M62.81);Other abnormalities of gait and mobility (R26.89);Cognitive communication deficit (R41.841)   Activity Tolerance Patient tolerated treatment well   Patient Left in chair;with call bell/phone within reach;with chair alarm set(with PT)   Nurse  Communication Mobility status        Time: 1420-1440 OT Time Calculation (min): 20 min  Charges: OT General Charges $OT Visit: 1 Visit OT Treatments $Self Care/Home Management : 8-22 mins     Darrol Jump OTR/L Learned [phone] 10/14/2018, 4:09 PM

## 2018-10-14 NOTE — TOC Initial Note (Addendum)
Transition of Care [facility]) - Initial/Assessment Note    Patient Details  Name: Daniel [name] MRN: [id number] Date of Birth: October 22, 1933  [facility] CM/SW Contact:    Sharin Mons, RN Phone Number: 10/14/2018, 11:32 AM  Clinical Narrative:                 RNCM received consult for possible SNF placement at time of discharge. RNCM spoke with patient and wife regarding PT recommendation of SNF placement at time of discharge. Pt states would like to go home,however, wife states pt is currently unable to care for self and she's unable to care for pt given patient's current physical needs and fall risk. Wife expressed understanding of PT recommendation and is agreeable to SNF placement at time of discharge for husband. RNCM discussed insurance authorization process and provided Medicare SNF ratings list.  No further questions reported at this time. RNCM to continue to follow and assist with discharge planning needs.   Expected Discharge Plan: Skilled Nursing Facility Barriers to Discharge: Continued Medical Work up   Patient Goals and CMS Choice Patient states their goals for this hospitalization and ongoing recovery are:: i want to go home CMS Medicare.gov Compare Post Acute Care list provided to:: Patient Choice offered to / list presented to : Patient, Spouse  Expected Discharge Plan and Services Expected Discharge Plan: [place]   Discharge Planning Services: CM Consult   Living arrangements for the past 2 months: Single Family Home                 DME Arranged: N/A DME Agency: NA                  Prior Living Arrangements/Services Living arrangements for the past 2 months: Single Family Home Lives with:: Spouse   Do you feel safe going back to the place where you live?: Yes      Need for Family Participation in Patient Care: Yes (Comment) Care giver support system in place?: No (comment)   Criminal Activity/Legal Involvement  Pertinent to Current Situation/Hospitalization: No - Comment as needed  Activities of Daily Living      Permission Sought/Granted Permission sought to share information with : Family Supports Permission granted to share information with : Yes, Verbal Permission Granted  Share Information with NAME: Jamere Stidham     Permission granted to share info w Relationship: wife  Permission granted to share info w Contact Information: [phone]  Emotional Assessment Appearance:: Appears stated age Attitude/Demeanor/Rapport: Complaining Affect (typically observed): Agitated Orientation: : Oriented to Self, Oriented to Place Alcohol / Substance Use: Not Applicable Psych Involvement: No (comment)  Admission diagnosis:  altered  Patient Active Problem List   Diagnosis Date Noted  . Evaluation by psychiatric service required   . Acute metabolic encephalopathy 35/46/5681  . Gait instability 10/12/2018  . HLD (hyperlipidemia) 10/12/2018  . Elevated troponin 10/12/2018  . Hypoglycemia 07/19/2017  . Hypotension 07/19/2017  . Normochromic normocytic anemia 07/19/2017  . Thrombocytopenia ([place]) 07/19/2017  . CKD (chronic kidney disease), stage III ([place]) 07/19/2017  . Seizure ([place]) 06/16/2017  . Type II diabetes mellitus with neurological manifestations ([place]) 03/05/2015  . Porokeratosis 03/05/2015  . Chronic systolic CHF (congestive heart failure) ([place]) 01/28/2015  . Coronary artery disease 01/28/2015  . Bruit of left carotid artery 01/28/2015  . 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 H/O detached retina repair 08/23/2014  . Acute on chronic renal failure ([place]) 04/17/2013  .  Dehydration 04/17/2013  . Alcohol dependence ([place]) 04/17/2013  . Transaminasemia 04/17/2013  . Type II diabetes mellitus with renal manifestations ([place]) 04/17/2013  . Near syncope 04/17/2013  . UTI (lower urinary tract infection) 04/17/2013  . Hematuria 04/17/2013   PCP:  Jani Gravel, MD Pharmacy:    [facility] [address] [place] [address] [place] [address] Phone: [phone] Fax: [phone]     Social Determinants of Health (SDOH) Interventions    Readmission Risk Interventions No flowsheet data found.

## 2018-10-15 DIAGNOSIS — Z008 Encounter for other general examination: Secondary | ICD-10-CM

## 2018-10-15 LAB — GLUCOSE, CAPILLARY
Glucose-Capillary: 146 mg/dL — ABNORMAL HIGH (ref 70–99)
Glucose-Capillary: 192 mg/dL — ABNORMAL HIGH (ref 70–99)
Glucose-Capillary: 171 mg/dL — ABNORMAL HIGH (ref 70–99)
Glucose-Capillary: 208 mg/dL — ABNORMAL HIGH (ref 70–99)

## 2018-10-15 NOTE — Progress Notes (Signed)
PROGRESS NOTE    Daniel [name]  MHD:[id number] DOB: Oct 30, 1933 DOA: 10/11/2018 PCP: Jani Gravel, MD   Brief Narrative:  HPI on 10/12/2018 by Dr. Ivor Costa Daniel [name] is a 85 y.o. male with medical history significant of hypertension, hyperlipidemia, diabetes mellitus, alcohol abuse, seizure, CAD, stent placement, prostate cancer (radiation seeds placement), CKD stage III, who presents with generalized weakness, gait instability and AMS.  Per his wife (I called his wife by phone),  Pt has been having generalized weakness and gait instability in the past several days.  His wife is very concerned that the patient may fall, but fortunately patient did not fall. The patient normally uses a walker, but was unable to ambulate with this. Wife states that pt normally can tell you the month and year, but is not able to do so today. The patient does not always know the day.  Patient does not have facial droop or slurred speech.  Patient does not have any chest pain, shortness of breath, fever or chills.  He has some mild dry cough.  No nausea vomiting, diarrhea, abdominal pain, symptoms of UTI.  Interim history Admitted for AMS, no source found. Pending SNF. Assessment & Plan   Acute metabolic encephalopathy with gait instability -Etiology unclear. Currently alert and oriented to self and place -CT head: No acute intercranial normality -MRI brain: No acute or reversible finding -COVID negative -UA and CXR unremarkable for infection -Ammonia level 29, TSH 2.253, vitamin B12 1089 -PT, OT consulted. PT recommended SNF. OT recommended HH. -Discussed with patient- he would like to go home. Discussed this with his wife, who referred [place] to his son. Not sure patient fully understands how much are he needs and his wife recently had surgery. Discharging to home is not the safest or best option for the patient.  -Wife tells me that he has not missed any of medications doses. -Psychiatry consulted for  assessment of medical capacity- patient lacks capacity to refuse SNF placement.   Alcohol dependence -Continue CIWA protocol -no signs of withdrawal  Diabetes mellitus, type II -Continue insulin sliding scale with CBG monitoring  CAD with mildly elevated troponin and BNP -Currently no complaints of chest pain or shortness of breath -Echocardiogram 06/09/2017: EF 60-65% -Continue to trend troponin -BNP 509.1 -Cardiology consulted and appreciated by admitted physician -Continue aspirin, statin -patient started lasix  -Echocardiogram EF 45-50%. LV diastolic parameters with pseudonormalization  -Cardiology compared studies to previous year and found that they were likely similar.  Global hypokinesis.  No indication for further inpatient work-up.  Patient to follow-up with cardiology as an outpatient.  Seizure disorder -Continue precautions -Continue keppra and ativan PRN  Chronic kidney disease, Stage III -Stable  Hyperlipidemia -Continue statin  DVT Prophylaxis  lovenox  Code Status: DNR  Family Communication: None at bedside. Discussed with wife via phone on 7/23.  Disposition Plan: Admitted. Pending SNF  Consultants Cardiology Psychiatry   Procedures  Echocardiogram  Antibiotics   Anti-infectives (From admission, onward)   None      Subjective:   Daniel [name] seen and examined today. Wanting to go home. Denies current chest pain, shortness of breath, abdominal pain, nausea, vomiting, diarrhea, constipation,  Objective:   Vitals:   10/13/18 2323 10/14/18 1400 10/14/18 2213 10/15/18 0703  BP: 101/64 113/61 135/71 107/77  Pulse:  75 75 78  Resp:  19 16 14   Temp:  98.4 F (36.9 C) 98 F (36.7 C) 98.6 F (37 C)  TempSrc:  Rectal Oral  Oral  SpO2:  99% 99% 96%  Weight:      Height:        Intake/Output Summary (Last 24 hours) at 10/15/2018 1127 Last data filed at 10/15/2018 1055 Gross per 24 hour  Intake 550 ml  Output 900 ml  Net -350 ml   Filed  Weights   10/11/18 1951  Weight: 84.8 kg   Exam  General: Well developed, well nourished, NAD  HEENT: NCAT, mucous membranes moist.   Cardiovascular: S1 S2 auscultated, soft SEM, RRR  Respiratory: Clear to auscultation bilaterally with equal chest rise  Abdomen: Soft, nontender, nondistended, + bowel sounds  Extremities: warm dry without cyanosis clubbing  Neuro: AAOx2, nonfocal  Psych: Appropriate mood and affect   Data Reviewed: I have personally reviewed following labs and imaging studies  CBC: Recent Labs  Lab 10/11/18 1810 10/12/18 0222 10/13/18 0251  WBC 7.1 7.3 7.6  HGB 12.5* 12.5* 13.6  HCT 37.0* 37.7* 39.7  MCV 97.6 97.4 94.3  PLT 124* 133* 976*   Basic Metabolic Panel: Recent Labs  Lab 10/11/18 1810 10/12/18 0222 10/13/18 0251  NA 132* 138 137  K 4.0 4.9 3.9  CL 107 111 103  CO2 18* 22 23  GLUCOSE 163* 103* 130*  BUN 22 18 19   CREATININE 1.41* 1.33* 1.37*  CALCIUM 8.2* 8.6* 9.2   GFR: Estimated Creatinine Clearance: 44.5 mL/min (A) (by C-G formula based on SCr of 1.37 mg/dL (H)). Liver Function Tests: Recent Labs  Lab 10/11/18 1810  AST 75*  ALT 42  ALKPHOS 92  BILITOT 0.8  PROT 5.3*  ALBUMIN 2.6*   No results for input(s): LIPASE, AMYLASE in the last 168 hours. Recent Labs  Lab 10/12/18 0222  AMMONIA 29   Coagulation Profile: No results for input(s): INR, PROTIME in the last 168 hours. Cardiac Enzymes: No results for input(s): CKTOTAL, CKMB, CKMBINDEX, TROPONINI in the last 168 hours. BNP (last 3 results) No results for input(s): PROBNP in the last 8760 hours. HbA1C: Recent Labs    10/13/18 0251  HGBA1C 6.6*   CBG: Recent Labs  Lab 10/14/18 0754 10/14/18 1153 10/14/18 1639 10/14/18 2218 10/15/18 0805  GLUCAP 107* 135* 203* 194* 146*   Lipid Profile: Recent Labs    10/13/18 0251  CHOL 153  HDL 48  LDLCALC 89  TRIG 78  CHOLHDL 3.2   Thyroid Function Tests: No results for input(s): TSH, T4TOTAL, FREET4,  T3FREE, THYROIDAB in the last 72 hours. Anemia Panel: No results for input(s): VITAMINB12, FOLATE, FERRITIN, TIBC, IRON, RETICCTPCT in the last 72 hours. Urine analysis:    Component Value Date/Time   COLORURINE YELLOW 10/12/2018 0103   APPEARANCEUR CLEAR 10/12/2018 0103   LABSPEC 1.020 10/12/2018 0103   PHURINE 7.0 10/12/2018 0103   GLUCOSEU NEGATIVE 10/12/2018 0103   HGBUR NEGATIVE 10/12/2018 0103   BILIRUBINUR NEGATIVE 10/12/2018 0103   KETONESUR NEGATIVE 10/12/2018 0103   PROTEINUR 100 (A) 10/12/2018 0103   UROBILINOGEN 1.0 04/17/2013 1014   NITRITE NEGATIVE 10/12/2018 0103   LEUKOCYTESUR NEGATIVE 10/12/2018 0103   Sepsis Labs: @LABRCNTIP (procalcitonin:4,lacticidven:4)  ) Recent Results (from the past 240 hour(s))  SARS Coronavirus 2 (CEPHEID - Performed in [place] hospital lab), Hosp Order     Status: None   Collection Time: 10/11/18 11:49 PM   Specimen: Nasopharyngeal Swab  Result Value Ref Range Status   SARS Coronavirus 2 NEGATIVE NEGATIVE Final    Comment: (NOTE) If result is NEGATIVE SARS-CoV-2 target nucleic acids are NOT DETECTED. The SARS-CoV-2 RNA  is generally detectable in upper and lower  respiratory specimens during the acute phase of infection. The lowest  concentration of SARS-CoV-2 viral copies this assay can detect is 250  copies / mL. A negative result does not preclude SARS-CoV-2 infection  and should not be used as the sole basis for treatment or other  patient management decisions.  A negative result may occur with  improper specimen collection / handling, submission of specimen other  than nasopharyngeal swab, presence of viral mutation(s) within the  areas targeted by this assay, and inadequate number of viral copies  (<250 copies / mL). A negative result must be combined with clinical  observations, patient history, and epidemiological information. If result is POSITIVE SARS-CoV-2 target nucleic acids are DETECTED. The SARS-CoV-2 RNA is  generally detectable in upper and lower  respiratory specimens dur ing the acute phase of infection.  Positive  results are indicative of active infection with SARS-CoV-2.  Clinical  correlation with patient history and other diagnostic information is  necessary to determine patient infection status.  Positive results do  not rule out bacterial infection or co-infection with other viruses. If result is PRESUMPTIVE POSTIVE SARS-CoV-2 nucleic acids MAY BE PRESENT.   A presumptive positive result was obtained on the submitted specimen  and confirmed on repeat testing.  While 2019 novel coronavirus  (SARS-CoV-2) nucleic acids may be present in the submitted sample  additional confirmatory testing may be necessary for epidemiological  and / or clinical management purposes  to differentiate between  SARS-CoV-2 and other Sarbecovirus currently known to infect humans.  If clinically indicated additional testing with an alternate test  methodology [phone]) is advised. The SARS-CoV-2 RNA is generally  detectable in upper and lower respiratory sp ecimens during the acute  phase of infection. The expected result is Negative. Fact Sheet for Patients:  StrictlyIdeas.no Fact Sheet for Healthcare Providers: BankingDealers.co.za This test is not yet approved or cleared by the [place] FDA and has been authorized for detection and/or diagnosis of SARS-CoV-2 by FDA under an Emergency Use Authorization (EUA).  This EUA will remain in effect (meaning this test can be used) for the duration of the COVID-19 declaration under Section 564(b)(1) of the Act, 21 U.S.C. section 360bbb-3(b)(1), unless the authorization is terminated or revoked sooner. Performed at [facility], [place] [address] [place], [place] [address]       Radiology Studies: No results found.   Scheduled Meds: . aspirin EC  81 mg Oral Daily  . enoxaparin (LOVENOX) injection   40 mg Subcutaneous Daily  . finasteride  5 mg Oral Daily  . folic acid  1 mg Oral Daily  . furosemide  20 mg Intravenous Daily  . insulin aspart  0-5 Units Subcutaneous QHS  . insulin aspart  0-9 Units Subcutaneous TID WC  . latanoprost  1 drop Both Eyes QHS  . levETIRAcetam  500 mg Oral BID  . LORazepam  0-4 mg Intravenous Q12H  . multivitamin with minerals  1 tablet Oral Daily  . rosuvastatin  10 mg Oral QPM  . sodium chloride flush  3 mL Intravenous Once  . thiamine  100 mg Oral Daily   Or  . thiamine  100 mg Intravenous Daily  . vitamin C  500 mg Oral QPM   Continuous Infusions:   LOS: 2 days   Time Spent in minutes   30 minutes  Ernestene Coover D.O. on 10/15/2018 at 11:27 AM  Between 7am to 7pm - Please see  pager noted on [email or web address]  After 7pm go to [email or web address]  And look for the night coverage person covering for me after hours  [facility] Office  [phone]

## 2018-10-16 LAB — GLUCOSE, CAPILLARY
Glucose-Capillary: 128 mg/dL — ABNORMAL HIGH (ref 70–99)
Glucose-Capillary: 236 mg/dL — ABNORMAL HIGH (ref 70–99)
Glucose-Capillary: 196 mg/dL — ABNORMAL HIGH (ref 70–99)
Glucose-Capillary: 195 mg/dL — ABNORMAL HIGH (ref 70–99)

## 2018-10-16 NOTE — Progress Notes (Signed)
Patient family updated about plan of care. Will continue to monitor.

## 2018-10-16 NOTE — Progress Notes (Signed)
PROGRESS NOTE    Daniel [name]  UKG:[id number] DOB: 02/14/1934 DOA: 10/11/2018 PCP: Jani Gravel, MD   Brief Narrative:  HPI on 10/12/2018 by Dr. Ivor Costa Daniel [name] is a 85 y.o. male with medical history significant of hypertension, hyperlipidemia, diabetes mellitus, alcohol abuse, seizure, CAD, stent placement, prostate cancer (radiation seeds placement), CKD stage III, who presents with generalized weakness, gait instability and AMS.  Per his wife (I called his wife by phone),  Pt has been having generalized weakness and gait instability in the past several days.  His wife is very concerned that the patient may fall, but fortunately patient did not fall. The patient normally uses a walker, but was unable to ambulate with this. Wife states that pt normally can tell you the month and year, but is not able to do so today. The patient does not always know the day.  Patient does not have facial droop or slurred speech.  Patient does not have any chest pain, shortness of breath, fever or chills.  He has some mild dry cough.  No nausea vomiting, diarrhea, abdominal pain, symptoms of UTI.  Interim history Admitted for AMS, no source found. Pending SNF. Assessment & Plan   Acute metabolic encephalopathy with gait instability -Etiology unclear. Currently alert and oriented to self and place  -CT head: No acute intercranial normality -MRI brain: No acute or reversible finding -COVID negative -UA and CXR unremarkable for infection -Ammonia level 29, TSH 2.253, vitamin B12 1089 -PT, OT consulted. PT recommended SNF. OT recommended HH. -Discussed with patient- he would like to go home. Discussed this with his wife, who referred [place] to his son. Not sure patient fully understands how much are he needs and his wife recently had surgery. Discharging to home is not the safest or best option for the patient.  -Wife tells me that he has not missed any of medications doses. -Psychiatry consulted for  assessment of medical capacity- patient lacks capacity to refuse SNF placement.   Alcohol dependence -Continue CIWA protocol -no signs of withdrawal  Diabetes mellitus, type II -Continue insulin sliding scale with CBG monitoring  CAD with mildly elevated troponin and BNP -Currently no complaints of chest pain or shortness of breath -Echocardiogram 06/09/2017: EF 60-65% -Continue to trend troponin -BNP 509.1 -Cardiology consulted and appreciated by admitted physician -Continue aspirin, statin -patient started lasix  -Echocardiogram EF 45-50%. LV diastolic parameters with pseudonormalization  -Cardiology compared studies to previous year and found that they were likely similar.  Global hypokinesis.  No indication for further inpatient work-up.  Patient to follow-up with cardiology as an outpatient.  Seizure disorder -Continue precautions -Continue keppra and ativan PRN  Chronic kidney disease, Stage III -Stable  Hyperlipidemia -Continue statin  DVT Prophylaxis  lovenox  Code Status: DNR  Family Communication: None at bedside. Discussed with wife via phone on 7/23.  Disposition Plan: Admitted. Pending SNF  Consultants Cardiology Psychiatry   Procedures  Echocardiogram  Antibiotics   Anti-infectives (From admission, onward)   None      Subjective:   Daniel [name] seen and examined today. Patient has no complaints. States he is ready to go home. States people have "been telling tales about me". Denies chest pain, shortness of breath, abdominal pain, N/V/C/D.  Objective:   Vitals:   10/15/18 0703 10/15/18 1601 10/15/18 2056 10/16/18 0535  BP: 107/77 119/60 (!) 132/57 123/61  Pulse: 78 68 74 68  Resp: 14 17 18 18   Temp: 98.6 F (37 C) 98.5  F (36.9 C) 98.4 F (36.9 C) 98.3 F (36.8 C)  TempSrc: Oral Oral Oral Oral  SpO2: 96% 97% 98% 97%  Weight:      Height:        Intake/Output Summary (Last 24 hours) at 10/16/2018 0944 Last data filed at 10/16/2018  0900 Gross per 24 hour  Intake 1072 ml  Output 1400 ml  Net -328 ml   Filed Weights   10/11/18 1951  Weight: 84.8 kg   Exam  General: Well developed, well nourished, NAD  HEENT: NCAT, mucous membranes moist.   Cardiovascular: S1 S2 auscultated, RRR, soft SEM  Respiratory: Clear to auscultation bilaterally  Abdomen: Soft, nontender, nondistended, + bowel sounds  Extremities: warm dry without cyanosis clubbing or edema  Neuro: AAOx2 (self, place), nonfocal  Psych: Normal affect and demeanor     Data Reviewed: I have personally reviewed following labs and imaging studies  CBC: Recent Labs  Lab 10/11/18 1810 10/12/18 0222 10/13/18 0251  WBC 7.1 7.3 7.6  HGB 12.5* 12.5* 13.6  HCT 37.0* 37.7* 39.7  MCV 97.6 97.4 94.3  PLT 124* 133* 696*   Basic Metabolic Panel: Recent Labs  Lab 10/11/18 1810 10/12/18 0222 10/13/18 0251  NA 132* 138 137  K 4.0 4.9 3.9  CL 107 111 103  CO2 18* 22 23  GLUCOSE 163* 103* 130*  BUN 22 18 19   CREATININE 1.41* 1.33* 1.37*  CALCIUM 8.2* 8.6* 9.2   GFR: Estimated Creatinine Clearance: 44.5 mL/min (A) (by C-G formula based on SCr of 1.37 mg/dL (H)). Liver Function Tests: Recent Labs  Lab 10/11/18 1810  AST 75*  ALT 42  ALKPHOS 92  BILITOT 0.8  PROT 5.3*  ALBUMIN 2.6*   No results for input(s): LIPASE, AMYLASE in the last 168 hours. Recent Labs  Lab 10/12/18 0222  AMMONIA 29   Coagulation Profile: No results for input(s): INR, PROTIME in the last 168 hours. Cardiac Enzymes: No results for input(s): CKTOTAL, CKMB, CKMBINDEX, TROPONINI in the last 168 hours. BNP (last 3 results) No results for input(s): PROBNP in the last 8760 hours. HbA1C: No results for input(s): HGBA1C in the last 72 hours. CBG: Recent Labs  Lab 10/15/18 0805 10/15/18 1216 10/15/18 1642 10/15/18 2056 10/16/18 0744  GLUCAP 146* 192* 171* 208* 128*   Lipid Profile: No results for input(s): CHOL, HDL, LDLCALC, TRIG, CHOLHDL, LDLDIRECT in the  last 72 hours. Thyroid Function Tests: No results for input(s): TSH, T4TOTAL, FREET4, T3FREE, THYROIDAB in the last 72 hours. Anemia Panel: No results for input(s): VITAMINB12, FOLATE, FERRITIN, TIBC, IRON, RETICCTPCT in the last 72 hours. Urine analysis:    Component Value Date/Time   COLORURINE YELLOW 10/12/2018 0103   APPEARANCEUR CLEAR 10/12/2018 0103   LABSPEC 1.020 10/12/2018 0103   PHURINE 7.0 10/12/2018 0103   GLUCOSEU NEGATIVE 10/12/2018 0103   HGBUR NEGATIVE 10/12/2018 0103   BILIRUBINUR NEGATIVE 10/12/2018 0103   KETONESUR NEGATIVE 10/12/2018 0103   PROTEINUR 100 (A) 10/12/2018 0103   UROBILINOGEN 1.0 04/17/2013 1014   NITRITE NEGATIVE 10/12/2018 0103   LEUKOCYTESUR NEGATIVE 10/12/2018 0103   Sepsis Labs: @LABRCNTIP (procalcitonin:4,lacticidven:4)  ) Recent Results (from the past 240 hour(s))  SARS Coronavirus 2 (CEPHEID - Performed in [facility] hospital lab), Hosp Order     Status: None   Collection Time: 10/11/18 11:49 PM   Specimen: Nasopharyngeal Swab  Result Value Ref Range Status   SARS Coronavirus 2 NEGATIVE NEGATIVE Final    Comment: (NOTE) If result is NEGATIVE SARS-CoV-2 target nucleic  acids are NOT DETECTED. The SARS-CoV-2 RNA is generally detectable in upper and lower  respiratory specimens during the acute phase of infection. The lowest  concentration of SARS-CoV-2 viral copies this assay can detect is 250  copies / mL. A negative result does not preclude SARS-CoV-2 infection  and should not be used as the sole basis for treatment or other  patient management decisions.  A negative result may occur with  improper specimen collection / handling, submission of specimen other  than nasopharyngeal swab, presence of viral mutation(s) within the  areas targeted by this assay, and inadequate number of viral copies  (<250 copies / mL). A negative result must be combined with clinical  observations, patient history, and epidemiological information. If  result is POSITIVE SARS-CoV-2 target nucleic acids are DETECTED. The SARS-CoV-2 RNA is generally detectable in upper and lower  respiratory specimens dur ing the acute phase of infection.  Positive  results are indicative of active infection with SARS-CoV-2.  Clinical  correlation with patient history and other diagnostic information is  necessary to determine patient infection status.  Positive results do  not rule out bacterial infection or co-infection with other viruses. If result is PRESUMPTIVE POSTIVE SARS-CoV-2 nucleic acids MAY BE PRESENT.   A presumptive positive result was obtained on the submitted specimen  and confirmed on repeat testing.  While 2019 novel coronavirus  (SARS-CoV-2) nucleic acids may be present in the submitted sample  additional confirmatory testing may be necessary for epidemiological  and / or clinical management purposes  to differentiate between  SARS-CoV-2 and other Sarbecovirus currently known to infect humans.  If clinically indicated additional testing with an alternate test  methodology [phone]) is advised. The SARS-CoV-2 RNA is generally  detectable in upper and lower respiratory sp ecimens during the acute  phase of infection. The expected result is Negative. Fact Sheet for Patients:  StrictlyIdeas.no Fact Sheet for Healthcare Providers: BankingDealers.co.za This test is not yet approved or cleared by the [place] FDA and has been authorized for detection and/or diagnosis of SARS-CoV-2 by FDA under an Emergency Use Authorization (EUA).  This EUA will remain in effect (meaning this test can be used) for the duration of the COVID-19 declaration under Section 564(b)(1) of the Act, 21 U.S.C. section 360bbb-3(b)(1), unless the authorization is terminated or revoked sooner. Performed at [facility], [place] [address] [place], [place] [address]       Radiology Studies: No results found.    Scheduled Meds: . aspirin EC  81 mg Oral Daily  . enoxaparin (LOVENOX) injection  40 mg Subcutaneous Daily  . finasteride  5 mg Oral Daily  . folic acid  1 mg Oral Daily  . furosemide  20 mg Intravenous Daily  . insulin aspart  0-5 Units Subcutaneous QHS  . insulin aspart  0-9 Units Subcutaneous TID WC  . latanoprost  1 drop Both Eyes QHS  . levETIRAcetam  500 mg Oral BID  . multivitamin with minerals  1 tablet Oral Daily  . rosuvastatin  10 mg Oral QPM  . sodium chloride flush  3 mL Intravenous Once  . thiamine  100 mg Oral Daily   Or  . thiamine  100 mg Intravenous Daily  . vitamin C  500 mg Oral QPM   Continuous Infusions:   LOS: 3 days   Time Spent in minutes   30 minutes  Terea Neubauer D.O. on 10/16/2018 at 9:44 AM  Between 7am to 7pm - Please see pager  noted on [email or web address]  After 7pm go to [email or web address]  And look for the night coverage person covering for me after hours  [facility] Office  [phone]

## 2018-10-17 DIAGNOSIS — R41841 Cognitive communication deficit: Secondary | ICD-10-CM | POA: Diagnosis not present

## 2018-10-17 DIAGNOSIS — F1029 Alcohol dependence with unspecified alcohol-induced disorder: Secondary | ICD-10-CM | POA: Diagnosis not present

## 2018-10-17 DIAGNOSIS — I5032 Chronic diastolic (congestive) heart failure: Secondary | ICD-10-CM | POA: Diagnosis not present

## 2018-10-17 DIAGNOSIS — I129 Hypertensive chronic kidney disease with stage 1 through stage 4 chronic kidney disease, or unspecified chronic kidney disease: Secondary | ICD-10-CM | POA: Diagnosis not present

## 2018-10-17 DIAGNOSIS — N183 Chronic kidney disease, stage 3 (moderate): Secondary | ICD-10-CM | POA: Diagnosis not present

## 2018-10-17 DIAGNOSIS — R2681 Unsteadiness on feet: Secondary | ICD-10-CM | POA: Diagnosis not present

## 2018-10-17 DIAGNOSIS — R2689 Other abnormalities of gait and mobility: Secondary | ICD-10-CM | POA: Diagnosis not present

## 2018-10-17 DIAGNOSIS — E119 Type 2 diabetes mellitus without complications: Secondary | ICD-10-CM | POA: Diagnosis not present

## 2018-10-17 DIAGNOSIS — G9341 Metabolic encephalopathy: Secondary | ICD-10-CM | POA: Diagnosis not present

## 2018-10-17 DIAGNOSIS — M6281 Muscle weakness (generalized): Secondary | ICD-10-CM | POA: Diagnosis not present

## 2018-10-17 DIAGNOSIS — E1149 Type 2 diabetes mellitus with other diabetic neurological complication: Secondary | ICD-10-CM | POA: Diagnosis not present

## 2018-10-17 DIAGNOSIS — R410 Disorientation, unspecified: Secondary | ICD-10-CM | POA: Diagnosis not present

## 2018-10-17 DIAGNOSIS — M2550 Pain in unspecified joint: Secondary | ICD-10-CM | POA: Diagnosis not present

## 2018-10-17 DIAGNOSIS — I959 Hypotension, unspecified: Secondary | ICD-10-CM | POA: Diagnosis not present

## 2018-10-17 DIAGNOSIS — Z008 Encounter for other general examination: Secondary | ICD-10-CM | POA: Diagnosis not present

## 2018-10-17 DIAGNOSIS — Z794 Long term (current) use of insulin: Secondary | ICD-10-CM | POA: Diagnosis not present

## 2018-10-17 DIAGNOSIS — R1312 Dysphagia, oropharyngeal phase: Secondary | ICD-10-CM | POA: Diagnosis not present

## 2018-10-17 DIAGNOSIS — F1021 Alcohol dependence, in remission: Secondary | ICD-10-CM | POA: Diagnosis not present

## 2018-10-17 DIAGNOSIS — G4089 Other seizures: Secondary | ICD-10-CM | POA: Diagnosis not present

## 2018-10-17 DIAGNOSIS — R278 Other lack of coordination: Secondary | ICD-10-CM | POA: Diagnosis not present

## 2018-10-17 DIAGNOSIS — Z7401 Bed confinement status: Secondary | ICD-10-CM | POA: Diagnosis not present

## 2018-10-17 LAB — GLUCOSE, CAPILLARY
Glucose-Capillary: 139 mg/dL — ABNORMAL HIGH (ref 70–99)
Glucose-Capillary: 105 mg/dL — ABNORMAL HIGH (ref 70–99)
Glucose-Capillary: 114 mg/dL — ABNORMAL HIGH (ref 70–99)
Glucose-Capillary: 158 mg/dL — ABNORMAL HIGH (ref 70–99)

## 2018-10-17 LAB — SARS CORONAVIRUS 2 BY RT PCR (HOSPITAL ORDER, PERFORMED IN ~~LOC~~ HOSPITAL LAB): SARS Coronavirus 2: NEGATIVE

## 2018-10-17 NOTE — Plan of Care (Signed)

## 2018-10-17 NOTE — TOC Transition Note (Addendum)
Transition of Care [facility]) - CM/SW Discharge Note   Patient Details  Name: ALEKSEI GOODLIN MRN: [id number] Date of Birth: May 26, 1933  Transition of Care [facility]) CM/SW Contact:  Sharin Mons, RN Phone Number: 10/17/2018, 11:32 AM   Clinical Narrative:     Patient will DC to: [facility] Anticipated DC date: 10/17/2018 Family notified: Lelon Frohlich( wife)/ Shannon(son) Transport by: Corey Harold   Per MD patient ready for DC today . RN, patient, patient's family, and facility notified of DC. Discharge Summary and FL2 sent to facility. RN to call report prior to discharge [phone] ). Pt's room # 106   . DC packet on chart. Ambulance transport requested for patient.   RNCM will sign off for now as intervention is no longer needed. Please consult [place] again if new needs arise.     Barriers to Discharge: Continued Medical Work up   Patient Goals and CMS Choice Patient states their goals for this hospitalization and ongoing recovery are:: i want to go home CMS Medicare.gov Compare Post Acute Care list provided to:: Patient Choice offered to / list presented to : Patient, Spouse  Discharge Placement                       Discharge Plan and Services   Discharge Planning Services: CM Consult            DME Arranged: N/A DME Agency: NA                  Social Determinants of Health ([place]) Interventions     Readmission Risk Interventions No flowsheet data found.

## 2018-10-17 NOTE — Care Management Important Message (Signed)
Important Message  Patient Details  Name: Daniel [name] MRN: [id number] Date of Birth: 09-05-1933   Medicare Important Message Given:  Yes     Orbie Pyo 10/17/2018, 3:06 PM

## 2018-10-17 NOTE — Progress Notes (Signed)
Dossie Arbour to be discharged Skilled nursing facility per MD order. Discussed prescriptions and follow up appointments with Spouse and Nurse at [facility]. Prescriptions given to facility; medication list explained in detail. Spouse verbalized understanding.   Skin clean, dry and intact without evidence of skin break down, no evidence of skin tears noted. IV catheter discontinued intact. Site without signs and symptoms of complications. Dressing and pressure applied. Pt denies pain at the site currently. No complaints noted.  Patient free of lines, drains, and wounds.  An After Visit Summary (AVS) was printed and given to PTAR. [facility] called and report given to nurse.   Cathlean Marseilles Tamala Julian, MSN, RN, [place], AGCNS

## 2018-10-18 DIAGNOSIS — R2689 Other abnormalities of gait and mobility: Secondary | ICD-10-CM | POA: Diagnosis not present

## 2018-10-18 DIAGNOSIS — I129 Hypertensive chronic kidney disease with stage 1 through stage 4 chronic kidney disease, or unspecified chronic kidney disease: Secondary | ICD-10-CM | POA: Diagnosis not present

## 2018-10-18 DIAGNOSIS — G9341 Metabolic encephalopathy: Secondary | ICD-10-CM | POA: Diagnosis not present

## 2018-10-18 DIAGNOSIS — I5032 Chronic diastolic (congestive) heart failure: Secondary | ICD-10-CM | POA: Diagnosis not present

## 2018-10-18 NOTE — Progress Notes (Signed)
Patient was discharge to [facility] via [facility]. Patient alert and confuse upon discharge. Patient didn't understand why he have to be discharge. Educated patient about his discharge. Notified patient wife on update and discharge.Marland Kitchen

## 2018-10-27 DIAGNOSIS — F1021 Alcohol dependence, in remission: Secondary | ICD-10-CM | POA: Diagnosis not present

## 2018-10-27 DIAGNOSIS — Z794 Long term (current) use of insulin: Secondary | ICD-10-CM | POA: Diagnosis not present

## 2018-10-27 DIAGNOSIS — E119 Type 2 diabetes mellitus without complications: Secondary | ICD-10-CM | POA: Diagnosis not present

## 2018-10-27 DIAGNOSIS — I5032 Chronic diastolic (congestive) heart failure: Secondary | ICD-10-CM | POA: Diagnosis not present

## 2018-11-02 ENCOUNTER — Ambulatory Visit: Payer: Medicare Other | Admitting: Podiatry

## 2018-11-02 DIAGNOSIS — G4089 Other seizures: Secondary | ICD-10-CM | POA: Diagnosis not present

## 2018-11-02 DIAGNOSIS — G9341 Metabolic encephalopathy: Secondary | ICD-10-CM | POA: Diagnosis not present

## 2018-11-02 DIAGNOSIS — I129 Hypertensive chronic kidney disease with stage 1 through stage 4 chronic kidney disease, or unspecified chronic kidney disease: Secondary | ICD-10-CM | POA: Diagnosis not present

## 2018-11-02 DIAGNOSIS — I5032 Chronic diastolic (congestive) heart failure: Secondary | ICD-10-CM | POA: Diagnosis not present

## 2018-11-03 ENCOUNTER — Ambulatory Visit: Payer: Medicare Other | Admitting: Cardiovascular Disease

## 2018-11-07 DIAGNOSIS — E118 Type 2 diabetes mellitus with unspecified complications: Secondary | ICD-10-CM | POA: Diagnosis not present

## 2018-11-07 DIAGNOSIS — E7800 Pure hypercholesterolemia, unspecified: Secondary | ICD-10-CM | POA: Diagnosis not present

## 2018-11-07 DIAGNOSIS — I10 Essential (primary) hypertension: Secondary | ICD-10-CM | POA: Diagnosis not present

## 2018-11-07 DIAGNOSIS — I2510 Atherosclerotic heart disease of native coronary artery without angina pectoris: Secondary | ICD-10-CM | POA: Diagnosis not present

## 2018-11-13 ENCOUNTER — Emergency Department (HOSPITAL_COMMUNITY): Payer: Medicare Other

## 2018-11-13 ENCOUNTER — Emergency Department (HOSPITAL_COMMUNITY)
Admission: EM | Admit: 2018-11-13 | Discharge: 2018-11-14 | Disposition: A | Discharge status: Home / Self Care | Payer: Medicare Other | Attending: Emergency Medicine | Admitting: Emergency Medicine

## 2018-11-13 DIAGNOSIS — E1122 Type 2 diabetes mellitus with diabetic chronic kidney disease: Secondary | ICD-10-CM | POA: Insufficient documentation

## 2018-11-13 DIAGNOSIS — Z7401 Bed confinement status: Secondary | ICD-10-CM | POA: Diagnosis not present

## 2018-11-13 DIAGNOSIS — N183 Chronic kidney disease, stage 3 (moderate): Secondary | ICD-10-CM | POA: Insufficient documentation

## 2018-11-13 DIAGNOSIS — I5022 Chronic systolic (congestive) heart failure: Secondary | ICD-10-CM | POA: Insufficient documentation

## 2018-11-13 DIAGNOSIS — I6789 Other cerebrovascular disease: Secondary | ICD-10-CM | POA: Diagnosis not present

## 2018-11-13 DIAGNOSIS — R945 Abnormal results of liver function studies: Secondary | ICD-10-CM | POA: Diagnosis not present

## 2018-11-13 DIAGNOSIS — I130 Hypertensive heart and chronic kidney disease with heart failure and stage 1 through stage 4 chronic kidney disease, or unspecified chronic kidney disease: Secondary | ICD-10-CM | POA: Insufficient documentation

## 2018-11-13 DIAGNOSIS — R4182 Altered mental status, unspecified: Secondary | ICD-10-CM | POA: Insufficient documentation

## 2018-11-13 DIAGNOSIS — Z7982 Long term (current) use of aspirin: Secondary | ICD-10-CM | POA: Diagnosis not present

## 2018-11-13 DIAGNOSIS — R7989 Other specified abnormal findings of blood chemistry: Secondary | ICD-10-CM

## 2018-11-13 DIAGNOSIS — E162 Hypoglycemia, unspecified: Secondary | ICD-10-CM | POA: Diagnosis not present

## 2018-11-13 DIAGNOSIS — E161 Other hypoglycemia: Secondary | ICD-10-CM | POA: Diagnosis not present

## 2018-11-13 DIAGNOSIS — R404 Transient alteration of awareness: Secondary | ICD-10-CM | POA: Diagnosis not present

## 2018-11-13 DIAGNOSIS — G319 Degenerative disease of nervous system, unspecified: Secondary | ICD-10-CM | POA: Diagnosis not present

## 2018-11-13 DIAGNOSIS — Z794 Long term (current) use of insulin: Secondary | ICD-10-CM | POA: Insufficient documentation

## 2018-11-13 DIAGNOSIS — Z79899 Other long term (current) drug therapy: Secondary | ICD-10-CM | POA: Insufficient documentation

## 2018-11-13 DIAGNOSIS — I2510 Atherosclerotic heart disease of native coronary artery without angina pectoris: Secondary | ICD-10-CM | POA: Insufficient documentation

## 2018-11-13 DIAGNOSIS — R0902 Hypoxemia: Secondary | ICD-10-CM | POA: Diagnosis not present

## 2018-11-13 DIAGNOSIS — M2550 Pain in unspecified joint: Secondary | ICD-10-CM | POA: Diagnosis not present

## 2018-11-13 LAB — COMPREHENSIVE METABOLIC PANEL
ALT: 67 U/L — ABNORMAL HIGH (ref 0–44)
AST: 108 U/L — ABNORMAL HIGH (ref 15–41)
Albumin: 2.8 g/dL — ABNORMAL LOW (ref 3.5–5.0)
Alkaline Phosphatase: 168 U/L — ABNORMAL HIGH (ref 38–126)
Anion gap: 10 (ref 5–15)
BUN: 30 mg/dL — ABNORMAL HIGH (ref 8–23)
CO2: 19 mmol/L — ABNORMAL LOW (ref 22–32)
Calcium: 8.8 mg/dL — ABNORMAL LOW (ref 8.9–10.3)
Chloride: 110 mmol/L (ref 98–111)
Creatinine, Ser: 1.38 mg/dL — ABNORMAL HIGH (ref 0.61–1.24)
GFR calc Af Amer: 55 mL/min — ABNORMAL LOW (ref >60)
GFR calc non Af Amer: 47 mL/min — ABNORMAL LOW (ref >60)
Glucose, Bld: 136 mg/dL — ABNORMAL HIGH (ref 70–99)
Potassium: 3.8 mmol/L (ref 3.5–5.1)
Sodium: 139 mmol/L (ref 135–145)
Total Bilirubin: 1 mg/dL (ref 0.3–1.2)
Total Protein: 5.8 g/dL — ABNORMAL LOW (ref 6.5–8.1)

## 2018-11-13 LAB — URINALYSIS, ROUTINE W REFLEX MICROSCOPIC
Bilirubin Urine: NEGATIVE
Glucose, UA: NEGATIVE mg/dL
Hgb urine dipstick: NEGATIVE
Ketones, ur: NEGATIVE mg/dL
Leukocytes,Ua: NEGATIVE
Nitrite: NEGATIVE
Protein, ur: 30 mg/dL — AB
Specific Gravity, Urine: 1.016 (ref 1.005–1.030)
pH: 5 (ref 5.0–8.0)

## 2018-11-13 LAB — CBG MONITORING, ED
Glucose-Capillary: 113 mg/dL — ABNORMAL HIGH (ref 70–99)
Glucose-Capillary: 103 mg/dL — ABNORMAL HIGH (ref 70–99)

## 2018-11-13 LAB — CBC
HCT: 36.6 % — ABNORMAL LOW (ref 39.0–52.0)
Hemoglobin: 12.1 g/dL — ABNORMAL LOW (ref 13.0–17.0)
MCH: 32.9 pg (ref 26.0–34.0)
MCHC: 33.1 g/dL (ref 30.0–36.0)
MCV: 99.5 fL (ref 80.0–100.0)
Platelets: 133 10*3/uL — ABNORMAL LOW (ref 150–400)
RBC: 3.68 MIL/uL — ABNORMAL LOW (ref 4.22–5.81)
RDW: 13.9 % (ref 11.5–15.5)
WBC: 7 10*3/uL (ref 4.0–10.5)
nRBC: 0 % (ref 0.0–0.2)

## 2018-11-13 NOTE — ED Notes (Signed)
Spoke with facility to notify pt was on his way and give report.

## 2018-11-13 NOTE — ED Notes (Signed)
Spoke with pt family and provided an update.

## 2018-11-13 NOTE — ED Notes (Signed)
Pt drank water with no issues. Tolerated well . Family at bedside

## 2018-11-13 NOTE — ED Triage Notes (Signed)
Pt arrives from [facility]. EMS reports pt was not responding this morning. Pt is diabetic and was given 11units of hunalog without checking sugars. EMS arrives and pt CGB was 39. 250cc D10 given at 1132: CBG up to 189 at that time  Pt is alert and oriented X2 during triage.

## 2018-11-13 NOTE — ED Provider Notes (Addendum)
[facility] EMERGENCY DEPARTMENT Provider Note   CSN: [id number] Arrival date & time: 11/13/18  1224     History   Chief Complaint No chief complaint on file.   HPI Daniel [name] is a 85 y.o. male with past medical history significant for hypertension, hyperlipidemia, diabetes mellitus, seizures, coronary artery disease, CKD stage III who presents with altered mental status.  Patient was noted to be "unresponsive" by facility staff.  Per triage note patient was given 11 units of Humalog before checking sugars. Upon EMS arrival patient CBG was 39 and patient given 250 cc of D10 at 1132.  CBG went up to 189 and patient was alert and oriented x2 during triage. Patient denies being in pain but does not provide further history.  Spoke with med tech at facility who states that patient was difficult to arouse this morning at which time they transferred him to the hospital.  Other than insulin, tech reports that the only medications patient received were finasteride, Keppra, and magnesium.  Technician denies administration of Xanax or any pain controlling medications.     HPI  Past Medical History:  Diagnosis Date  . Alcohol abuse   . Cancer [facility])    prostate  . Cardiomyopathy ([place])   . CHF (congestive heart failure) ([place])   . Complication of anesthesia    Hx of seizure when put to sleep for surgery on 08/2009  . Constipation   . Coronary artery disease   . Diabetes mellitus without complication ([place])   . H/O hiatal hernia   . Hyperlipidemia   . Hypertension   . Peripheral neuropathy   . Seizures ([place])   . Urinary incontinence     Patient Active Problem List   Diagnosis Date Noted  . Evaluation by psychiatric service required   . Acute metabolic encephalopathy 08/67/6195  . Gait instability 10/12/2018  . HLD (hyperlipidemia) 10/12/2018  . Elevated troponin 10/12/2018  . Hypoglycemia 07/19/2017  . Hypotension 07/19/2017  . Normochromic normocytic anemia  07/19/2017  . Thrombocytopenia ([place]) 07/19/2017  . CKD (chronic kidney disease), stage III ([place]) 07/19/2017  . Seizure ([place]) 06/16/2017  . Type II diabetes mellitus with neurological manifestations ([place]) 03/05/2015  . Porokeratosis 03/05/2015  . Chronic systolic CHF (congestive heart failure) ([place]) 01/28/2015  . Coronary artery disease 01/28/2015  . Bruit of left carotid artery 01/28/2015  . 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 H/O detached retina repair 08/23/2014  . Acute on chronic renal failure ([place]) 04/17/2013  . Dehydration 04/17/2013  . Alcohol dependence ([place]) 04/17/2013  . Transaminasemia 04/17/2013  . Type II diabetes mellitus with renal manifestations ([place]) 04/17/2013  . Near syncope 04/17/2013  . UTI (lower urinary tract infection) 04/17/2013  . Hematuria 04/17/2013    Past Surgical History:  Procedure Laterality Date  . CATARACT EXTRACTION, BILATERAL    . CORONARY ANGIOPLASTY     stents x 2  . CORONARY STENT PLACEMENT     2004  . PROSTATE SURGERY  2008   SEED IMPLANTS  . RADIOACTIVE SEED IMPLANT     prostate  . TONSILLECTOMY     1940's        Home Medications    Prior to Admission medications   Medication Sig Start Date End Date Taking? Authorizing Provider  ALPRAZolam (XANAX) 0.25 MG tablet Take 1 tablet (0.25 mg total) by mouth 2 (two) times daily as needed for anxiety. 10/14/18   Cristal Ford, DO  aspirin 81 MG tablet  Take 81 mg by mouth daily.     [provider]  finasteride (PROSCAR) 5 MG tablet Take 5 mg by mouth daily.  01/09/13   [provider]  folic acid (FOLVITE) 1 MG tablet Take 1 tablet (1 mg total) by mouth daily. 10/15/18   Mikhail, Velta Addison, DO  insulin lispro (HUMALOG) 100 UNIT/ML KwikPen Inject 11 Units into the skin 2 (two) times a day. 06/02/18   [provider]  latanoprost (XALATAN) 0.005 % ophthalmic solution Place 1 drop into both eyes at bedtime.    [provider]   levETIRAcetam (KEPPRA) 500 MG tablet Take 1 tablet (500 mg total) by mouth 2 (two) times daily. 01/24/18   Black, Lezlie Octave, NP  magnesium oxide (MAG-OX) 400 MG tablet Take 400 mg by mouth daily.    [provider]  Multiple Vitamin (MULTIVITAMIN) tablet Take 1 tablet by mouth every evening.     [provider]  rosuvastatin (CRESTOR) 20 MG tablet Take 10 mg by mouth every evening.     [provider]  thiamine 100 MG tablet Take 1 tablet (100 mg total) by mouth daily. 10/15/18   Mikhail, Maryann, DO  TOUJEO SOLOSTAR 300 UNIT/ML SOPN Inject 18 Units into the skin at bedtime. 06/02/18   [provider]  vitamin C (ASCORBIC ACID) 500 MG tablet Take 500 mg by mouth every evening.     [provider]    Family History Family History  Problem Relation Age of Onset  . Diabetes Mother   . Heart disease Father   . Heart disease Brother   . Diabetes Brother     Social History Social History   Tobacco Use  . Smoking status: Never Smoker  . Smokeless tobacco: Never Used  Substance Use Topics  . Alcohol use: Yes  . Drug use: No     Allergies   Patient has no known allergies.   Review of Systems Review of Systems Complete review of systems unable to be obtained due to patient's mental status. Patient denies pain but does not follow additional questioning.  Physical Exam Updated Vital Signs BP 125/76 (BP Location: Right Arm)   Pulse 72   Temp 98 F (36.7 C) (Oral)   Resp 17   SpO2 98%   Physical Exam Constitutional:      General: He is not in acute distress.    Appearance: Normal appearance.  HENT:     Head: Normocephalic and atraumatic.     Right Ear: External ear normal.     Left Ear: External ear normal.  Neck:     Musculoskeletal: Neck supple.  Cardiovascular:     Rate and Rhythm: Normal rate and regular rhythm.     Heart sounds: Normal heart sounds. No murmur. No friction rub. No gallop.   Pulmonary:     Breath sounds:  Normal breath sounds. No wheezing, rhonchi or rales.  Abdominal:     General: Abdomen is flat. There is no distension.     Palpations: Abdomen is soft.     Tenderness: There is no abdominal tenderness. There is no guarding.  Musculoskeletal:        General: No swelling or tenderness.  Skin:    General: Skin is warm and dry.  Neurological:     General: No focal deficit present.     Cranial Nerves: No cranial nerve deficit.     Motor: No weakness.     Comments: AOx2 stating name and that he is  at [facility], does not answer questions regarding date  Psychiatric:        Mood and Affect: Mood normal.      ED Treatments / Results  Labs (all labs ordered are listed, but only abnormal results are displayed) Labs Reviewed  CBC - Abnormal; Notable for the following components:      Result Value   RBC 3.68 (*)    Hemoglobin 12.1 (*)    HCT 36.6 (*)    Platelets 133 (*)    All other components within normal limits  COMPREHENSIVE METABOLIC PANEL - Abnormal; Notable for the following components:   CO2 19 (*)    Glucose, Bld 136 (*)    BUN 30 (*)    Creatinine, Ser 1.38 (*)    Calcium 8.8 (*)    Total Protein 5.8 (*)    Albumin 2.8 (*)    AST 108 (*)    ALT 67 (*)    Alkaline Phosphatase 168 (*)    GFR calc non Af Amer 47 (*)    GFR calc Af Amer 55 (*)    All other components within normal limits  CBG MONITORING, ED - Abnormal; Notable for the following components:   Glucose-Capillary 113 (*)    All other components within normal limits  CBG MONITORING, ED - Abnormal; Notable for the following components:   Glucose-Capillary 103 (*)    All other components within normal limits  URINALYSIS, ROUTINE W REFLEX MICROSCOPIC    EKG EKG Interpretation  Date/Time:  'Sunday November 13 2018 12:25:30 EDT Ventricular Rate:  76 PR Interval:    QRS Duration: 124 QT Interval:  443 QTC Calculation: 495 R Axis:   -62 Text Interpretation:  Sinus rhythm with first degree AV block  Non-specific intra-ventricular conduction delay Confirmed by Steinl, Kevin ([id number]) on 11/13/2018 12:31:17 PM   Radiology Ct Head Wo Contrast  Result Date: 11/13/2018 CLINICAL DATA:  From assisted living. Staff reports pt was not responding this morning. EXAM: CT HEAD WITHOUT CONTRAST TECHNIQUE: Contiguous axial images were obtained from the base of the skull through the vertex without intravenous contrast. COMPARISON:  Is 10/11/2018 FINDINGS: Brain: There is central and cortical atrophy. Periventricular white matter changes are consistent with small vessel disease. There is no intra or extra-axial fluid collection or mass lesion. The basilar cisterns and ventricles have a normal appearance. There is no CT evidence for acute infarction or hemorrhage. Vascular: There is atherosclerotic calcification of the internal carotid arteries. No hyperdense vessels. Skull: Normal. Negative for fracture or focal lesion. Sinuses/Orbits: There is mucosal thickening of the paranasal sinuses. Opacification identified within the posterior aspect of the sphenoid air cell, stable. Other: None. IMPRESSION: 1. Atrophy and small vessel disease. 2. No evidence for acute intracranial abnormality. 3. Chronic sinus changes. Electronically Signed   By: Elizabeth  Brown M.D.   On: 11/13/2018 14:29    Procedures Procedures (including critical care time)  Medications Ordered in ED Medications - No data to display   Initial Impression / Assessment and Plan / ED Course  I have reviewed the triage vital signs and the nursing notes.  Pertinent labs & imaging results that were available during my care of the patient were reviewed by me and considered in my medical decision making (see chart for details).         84'  year old male who presents after being difficult to arouse this AM. Patient is AOx2 which is at baseline per chart review but son says is  new.  Patient afebrile, hemodynamically stable and without focal neurologic  deficit on exam. CT head without acute abnormality to explain potential AMS. CBC demonstrates WBC WNL at 7.0. CMP demonstrates creatinine of 1.38 (at baseline), AST/ALT of 108/67 and alk phos elevated to 168. LFT elevation may be secondary to rosuvastatin side effect. Advised on discharge to hold statin medication until followup with PCP for evaluation. On re-assessment patient is more alert but still AOx2. Patient AMS may be seconary to effects from potentially prolonged hypoglycemia. Urinalysis pending. Patient is stable and appropriate for discharge following results of UA.  Patient care discussed with and transferred to Dr. Johnney Killian  Final Clinical Impressions(s) / ED Diagnoses   Final diagnoses:  Altered mental status, unspecified altered mental status type  Elevated liver function tests    ED Discharge Orders    None       Jeanmarie Hubert, MD 11/13/18 1546    Jeanmarie Hubert, MD 11/13/18 1547    Lajean Saver, MD 11/13/18 [phone]

## 2018-11-13 NOTE — ED Notes (Signed)
[facility] called for transport to [facility]

## 2018-11-13 NOTE — ED Notes (Signed)
PT could not support weight nor balance with this tech when attempting to stand

## 2018-11-13 NOTE — Discharge Instructions (Addendum)
It was our pleasure to provide your ER care today - we hope that you feel better.  A couple of your liver function tests are elevated - hold/do not take your crestor medication until follow up with your doctor.   Minimize use of sedating medications.   Follow up with primary care doctor in the next couple of days for recheck.  Return to ER if worse, new symptoms, fevers, new or severe pain, trouble breathing, other concern.

## 2018-11-13 NOTE — ED Notes (Signed)
CGB 127 upon arrival

## 2018-11-13 NOTE — ED Provider Notes (Signed)
If UA neg ok for D/c, if UA pos, OK for Keflex and D/C. Physical Exam  BP 125/76 (BP Location: Right Arm)   Pulse 72   Temp 98 F (36.7 C) (Oral)   Resp 17   SpO2 98%   Physical Exam  ED Course/Procedures     Procedures  MDM   UA negative.      Charlesetta Shanks, MD 11/13/18 [phone]

## 2018-11-14 LAB — CBG MONITORING, ED: Glucose-Capillary: 127 mg/dL — ABNORMAL HIGH (ref 70–99)

## 2018-11-16 DIAGNOSIS — F329 Major depressive disorder, single episode, unspecified: Secondary | ICD-10-CM | POA: Diagnosis not present

## 2018-11-16 DIAGNOSIS — G9341 Metabolic encephalopathy: Secondary | ICD-10-CM | POA: Diagnosis not present

## 2018-11-16 DIAGNOSIS — G40909 Epilepsy, unspecified, not intractable, without status epilepticus: Secondary | ICD-10-CM | POA: Diagnosis not present

## 2018-11-16 DIAGNOSIS — L89312 Pressure ulcer of right buttock, stage 2: Secondary | ICD-10-CM | POA: Diagnosis not present

## 2018-11-16 DIAGNOSIS — E1140 Type 2 diabetes mellitus with diabetic neuropathy, unspecified: Secondary | ICD-10-CM | POA: Diagnosis not present

## 2018-11-16 DIAGNOSIS — Z87440 Personal history of urinary (tract) infections: Secondary | ICD-10-CM | POA: Diagnosis not present

## 2018-11-16 DIAGNOSIS — I5022 Chronic systolic (congestive) heart failure: Secondary | ICD-10-CM | POA: Diagnosis not present

## 2018-11-16 DIAGNOSIS — Z794 Long term (current) use of insulin: Secondary | ICD-10-CM | POA: Diagnosis not present

## 2018-11-16 DIAGNOSIS — F419 Anxiety disorder, unspecified: Secondary | ICD-10-CM | POA: Diagnosis not present

## 2018-11-16 DIAGNOSIS — E1122 Type 2 diabetes mellitus with diabetic chronic kidney disease: Secondary | ICD-10-CM | POA: Diagnosis not present

## 2018-11-16 DIAGNOSIS — S80211D Abrasion, right knee, subsequent encounter: Secondary | ICD-10-CM | POA: Diagnosis not present

## 2018-11-16 DIAGNOSIS — E11621 Type 2 diabetes mellitus with foot ulcer: Secondary | ICD-10-CM | POA: Diagnosis not present

## 2018-11-16 DIAGNOSIS — I10 Essential (primary) hypertension: Secondary | ICD-10-CM | POA: Diagnosis not present

## 2018-11-16 DIAGNOSIS — F1020 Alcohol dependence, uncomplicated: Secondary | ICD-10-CM | POA: Diagnosis not present

## 2018-11-16 DIAGNOSIS — E785 Hyperlipidemia, unspecified: Secondary | ICD-10-CM | POA: Diagnosis not present

## 2018-11-16 DIAGNOSIS — I130 Hypertensive heart and chronic kidney disease with heart failure and stage 1 through stage 4 chronic kidney disease, or unspecified chronic kidney disease: Secondary | ICD-10-CM | POA: Diagnosis not present

## 2018-11-16 DIAGNOSIS — L97512 Non-pressure chronic ulcer of other part of right foot with fat layer exposed: Secondary | ICD-10-CM | POA: Diagnosis not present

## 2018-11-16 DIAGNOSIS — I2510 Atherosclerotic heart disease of native coronary artery without angina pectoris: Secondary | ICD-10-CM | POA: Diagnosis not present

## 2018-11-16 DIAGNOSIS — N183 Chronic kidney disease, stage 3 (moderate): Secondary | ICD-10-CM | POA: Diagnosis not present

## 2018-11-16 DIAGNOSIS — E119 Type 2 diabetes mellitus without complications: Secondary | ICD-10-CM | POA: Diagnosis not present

## 2018-11-18 DIAGNOSIS — E1122 Type 2 diabetes mellitus with diabetic chronic kidney disease: Secondary | ICD-10-CM | POA: Diagnosis not present

## 2018-11-18 DIAGNOSIS — L89312 Pressure ulcer of right buttock, stage 2: Secondary | ICD-10-CM | POA: Diagnosis not present

## 2018-11-18 DIAGNOSIS — I5022 Chronic systolic (congestive) heart failure: Secondary | ICD-10-CM | POA: Diagnosis not present

## 2018-11-18 DIAGNOSIS — G9341 Metabolic encephalopathy: Secondary | ICD-10-CM | POA: Diagnosis not present

## 2018-11-18 DIAGNOSIS — S80211D Abrasion, right knee, subsequent encounter: Secondary | ICD-10-CM | POA: Diagnosis not present

## 2018-11-18 DIAGNOSIS — I130 Hypertensive heart and chronic kidney disease with heart failure and stage 1 through stage 4 chronic kidney disease, or unspecified chronic kidney disease: Secondary | ICD-10-CM | POA: Diagnosis not present

## 2018-11-18 DIAGNOSIS — L97512 Non-pressure chronic ulcer of other part of right foot with fat layer exposed: Secondary | ICD-10-CM | POA: Diagnosis not present

## 2018-11-18 DIAGNOSIS — I2510 Atherosclerotic heart disease of native coronary artery without angina pectoris: Secondary | ICD-10-CM | POA: Diagnosis not present

## 2018-11-18 DIAGNOSIS — E785 Hyperlipidemia, unspecified: Secondary | ICD-10-CM | POA: Diagnosis not present

## 2018-11-18 DIAGNOSIS — F329 Major depressive disorder, single episode, unspecified: Secondary | ICD-10-CM | POA: Diagnosis not present

## 2018-11-18 DIAGNOSIS — Z87440 Personal history of urinary (tract) infections: Secondary | ICD-10-CM | POA: Diagnosis not present

## 2018-11-18 DIAGNOSIS — N183 Chronic kidney disease, stage 3 (moderate): Secondary | ICD-10-CM | POA: Diagnosis not present

## 2018-11-18 DIAGNOSIS — G40909 Epilepsy, unspecified, not intractable, without status epilepticus: Secondary | ICD-10-CM | POA: Diagnosis not present

## 2018-11-18 DIAGNOSIS — E11621 Type 2 diabetes mellitus with foot ulcer: Secondary | ICD-10-CM | POA: Diagnosis not present

## 2018-11-18 DIAGNOSIS — E1140 Type 2 diabetes mellitus with diabetic neuropathy, unspecified: Secondary | ICD-10-CM | POA: Diagnosis not present

## 2018-11-18 DIAGNOSIS — F1020 Alcohol dependence, uncomplicated: Secondary | ICD-10-CM | POA: Diagnosis not present

## 2018-11-22 DIAGNOSIS — I5022 Chronic systolic (congestive) heart failure: Secondary | ICD-10-CM | POA: Diagnosis not present

## 2018-11-22 DIAGNOSIS — E1140 Type 2 diabetes mellitus with diabetic neuropathy, unspecified: Secondary | ICD-10-CM | POA: Diagnosis not present

## 2018-11-22 DIAGNOSIS — L97512 Non-pressure chronic ulcer of other part of right foot with fat layer exposed: Secondary | ICD-10-CM | POA: Diagnosis not present

## 2018-11-22 DIAGNOSIS — F1020 Alcohol dependence, uncomplicated: Secondary | ICD-10-CM | POA: Diagnosis not present

## 2018-11-22 DIAGNOSIS — N183 Chronic kidney disease, stage 3 (moderate): Secondary | ICD-10-CM | POA: Diagnosis not present

## 2018-11-22 DIAGNOSIS — G9341 Metabolic encephalopathy: Secondary | ICD-10-CM | POA: Diagnosis not present

## 2018-11-22 DIAGNOSIS — G40909 Epilepsy, unspecified, not intractable, without status epilepticus: Secondary | ICD-10-CM | POA: Diagnosis not present

## 2018-11-22 DIAGNOSIS — S80211D Abrasion, right knee, subsequent encounter: Secondary | ICD-10-CM | POA: Diagnosis not present

## 2018-11-22 DIAGNOSIS — I2510 Atherosclerotic heart disease of native coronary artery without angina pectoris: Secondary | ICD-10-CM | POA: Diagnosis not present

## 2018-11-22 DIAGNOSIS — E1122 Type 2 diabetes mellitus with diabetic chronic kidney disease: Secondary | ICD-10-CM | POA: Diagnosis not present

## 2018-11-22 DIAGNOSIS — E785 Hyperlipidemia, unspecified: Secondary | ICD-10-CM | POA: Diagnosis not present

## 2018-11-22 DIAGNOSIS — F329 Major depressive disorder, single episode, unspecified: Secondary | ICD-10-CM | POA: Diagnosis not present

## 2018-11-22 DIAGNOSIS — I130 Hypertensive heart and chronic kidney disease with heart failure and stage 1 through stage 4 chronic kidney disease, or unspecified chronic kidney disease: Secondary | ICD-10-CM | POA: Diagnosis not present

## 2018-11-22 DIAGNOSIS — L89312 Pressure ulcer of right buttock, stage 2: Secondary | ICD-10-CM | POA: Diagnosis not present

## 2018-11-22 DIAGNOSIS — Z87440 Personal history of urinary (tract) infections: Secondary | ICD-10-CM | POA: Diagnosis not present

## 2018-11-22 DIAGNOSIS — E11621 Type 2 diabetes mellitus with foot ulcer: Secondary | ICD-10-CM | POA: Diagnosis not present

## 2018-11-23 DIAGNOSIS — E1140 Type 2 diabetes mellitus with diabetic neuropathy, unspecified: Secondary | ICD-10-CM | POA: Diagnosis not present

## 2018-11-23 DIAGNOSIS — G40909 Epilepsy, unspecified, not intractable, without status epilepticus: Secondary | ICD-10-CM | POA: Diagnosis not present

## 2018-11-23 DIAGNOSIS — L97512 Non-pressure chronic ulcer of other part of right foot with fat layer exposed: Secondary | ICD-10-CM | POA: Diagnosis not present

## 2018-11-23 DIAGNOSIS — I2510 Atherosclerotic heart disease of native coronary artery without angina pectoris: Secondary | ICD-10-CM | POA: Diagnosis not present

## 2018-11-23 DIAGNOSIS — I5022 Chronic systolic (congestive) heart failure: Secondary | ICD-10-CM | POA: Diagnosis not present

## 2018-11-23 DIAGNOSIS — E11621 Type 2 diabetes mellitus with foot ulcer: Secondary | ICD-10-CM | POA: Diagnosis not present

## 2018-11-23 DIAGNOSIS — N183 Chronic kidney disease, stage 3 (moderate): Secondary | ICD-10-CM | POA: Diagnosis not present

## 2018-11-23 DIAGNOSIS — Z87440 Personal history of urinary (tract) infections: Secondary | ICD-10-CM | POA: Diagnosis not present

## 2018-11-23 DIAGNOSIS — S31819A Unspecified open wound of right buttock, initial encounter: Secondary | ICD-10-CM | POA: Diagnosis not present

## 2018-11-23 DIAGNOSIS — I130 Hypertensive heart and chronic kidney disease with heart failure and stage 1 through stage 4 chronic kidney disease, or unspecified chronic kidney disease: Secondary | ICD-10-CM | POA: Diagnosis not present

## 2018-11-23 DIAGNOSIS — E119 Type 2 diabetes mellitus without complications: Secondary | ICD-10-CM | POA: Diagnosis not present

## 2018-11-23 DIAGNOSIS — L89312 Pressure ulcer of right buttock, stage 2: Secondary | ICD-10-CM | POA: Diagnosis not present

## 2018-11-23 DIAGNOSIS — G9341 Metabolic encephalopathy: Secondary | ICD-10-CM | POA: Diagnosis not present

## 2018-11-23 DIAGNOSIS — E1122 Type 2 diabetes mellitus with diabetic chronic kidney disease: Secondary | ICD-10-CM | POA: Diagnosis not present

## 2018-11-23 DIAGNOSIS — S91301A Unspecified open wound, right foot, initial encounter: Secondary | ICD-10-CM | POA: Diagnosis not present

## 2018-11-23 DIAGNOSIS — E785 Hyperlipidemia, unspecified: Secondary | ICD-10-CM | POA: Diagnosis not present

## 2018-11-23 DIAGNOSIS — F1020 Alcohol dependence, uncomplicated: Secondary | ICD-10-CM | POA: Diagnosis not present

## 2018-11-23 DIAGNOSIS — I10 Essential (primary) hypertension: Secondary | ICD-10-CM | POA: Diagnosis not present

## 2018-11-23 DIAGNOSIS — S80211D Abrasion, right knee, subsequent encounter: Secondary | ICD-10-CM | POA: Diagnosis not present

## 2018-11-23 DIAGNOSIS — F329 Major depressive disorder, single episode, unspecified: Secondary | ICD-10-CM | POA: Diagnosis not present

## 2018-11-25 DIAGNOSIS — G9341 Metabolic encephalopathy: Secondary | ICD-10-CM | POA: Diagnosis not present

## 2018-11-25 DIAGNOSIS — G40909 Epilepsy, unspecified, not intractable, without status epilepticus: Secondary | ICD-10-CM | POA: Diagnosis not present

## 2018-11-25 DIAGNOSIS — E1122 Type 2 diabetes mellitus with diabetic chronic kidney disease: Secondary | ICD-10-CM | POA: Diagnosis not present

## 2018-11-25 DIAGNOSIS — E1140 Type 2 diabetes mellitus with diabetic neuropathy, unspecified: Secondary | ICD-10-CM | POA: Diagnosis not present

## 2018-11-25 DIAGNOSIS — N183 Chronic kidney disease, stage 3 (moderate): Secondary | ICD-10-CM | POA: Diagnosis not present

## 2018-11-25 DIAGNOSIS — Z87440 Personal history of urinary (tract) infections: Secondary | ICD-10-CM | POA: Diagnosis not present

## 2018-11-25 DIAGNOSIS — F1020 Alcohol dependence, uncomplicated: Secondary | ICD-10-CM | POA: Diagnosis not present

## 2018-11-25 DIAGNOSIS — I2510 Atherosclerotic heart disease of native coronary artery without angina pectoris: Secondary | ICD-10-CM | POA: Diagnosis not present

## 2018-11-25 DIAGNOSIS — L97512 Non-pressure chronic ulcer of other part of right foot with fat layer exposed: Secondary | ICD-10-CM | POA: Diagnosis not present

## 2018-11-25 DIAGNOSIS — I130 Hypertensive heart and chronic kidney disease with heart failure and stage 1 through stage 4 chronic kidney disease, or unspecified chronic kidney disease: Secondary | ICD-10-CM | POA: Diagnosis not present

## 2018-11-25 DIAGNOSIS — F329 Major depressive disorder, single episode, unspecified: Secondary | ICD-10-CM | POA: Diagnosis not present

## 2018-11-25 DIAGNOSIS — E11621 Type 2 diabetes mellitus with foot ulcer: Secondary | ICD-10-CM | POA: Diagnosis not present

## 2018-11-25 DIAGNOSIS — I5022 Chronic systolic (congestive) heart failure: Secondary | ICD-10-CM | POA: Diagnosis not present

## 2018-11-25 DIAGNOSIS — L89312 Pressure ulcer of right buttock, stage 2: Secondary | ICD-10-CM | POA: Diagnosis not present

## 2018-11-25 DIAGNOSIS — S80211D Abrasion, right knee, subsequent encounter: Secondary | ICD-10-CM | POA: Diagnosis not present

## 2018-11-25 DIAGNOSIS — E785 Hyperlipidemia, unspecified: Secondary | ICD-10-CM | POA: Diagnosis not present

## 2018-11-29 DIAGNOSIS — S80211D Abrasion, right knee, subsequent encounter: Secondary | ICD-10-CM | POA: Diagnosis not present

## 2018-11-29 DIAGNOSIS — E11621 Type 2 diabetes mellitus with foot ulcer: Secondary | ICD-10-CM | POA: Diagnosis not present

## 2018-11-29 DIAGNOSIS — F1020 Alcohol dependence, uncomplicated: Secondary | ICD-10-CM | POA: Diagnosis not present

## 2018-11-29 DIAGNOSIS — E1122 Type 2 diabetes mellitus with diabetic chronic kidney disease: Secondary | ICD-10-CM | POA: Diagnosis not present

## 2018-11-29 DIAGNOSIS — I5022 Chronic systolic (congestive) heart failure: Secondary | ICD-10-CM | POA: Diagnosis not present

## 2018-11-29 DIAGNOSIS — N183 Chronic kidney disease, stage 3 (moderate): Secondary | ICD-10-CM | POA: Diagnosis not present

## 2018-11-29 DIAGNOSIS — I130 Hypertensive heart and chronic kidney disease with heart failure and stage 1 through stage 4 chronic kidney disease, or unspecified chronic kidney disease: Secondary | ICD-10-CM | POA: Diagnosis not present

## 2018-11-29 DIAGNOSIS — Z87440 Personal history of urinary (tract) infections: Secondary | ICD-10-CM | POA: Diagnosis not present

## 2018-11-29 DIAGNOSIS — L97512 Non-pressure chronic ulcer of other part of right foot with fat layer exposed: Secondary | ICD-10-CM | POA: Diagnosis not present

## 2018-11-29 DIAGNOSIS — L89312 Pressure ulcer of right buttock, stage 2: Secondary | ICD-10-CM | POA: Diagnosis not present

## 2018-11-29 DIAGNOSIS — G9341 Metabolic encephalopathy: Secondary | ICD-10-CM | POA: Diagnosis not present

## 2018-11-29 DIAGNOSIS — E785 Hyperlipidemia, unspecified: Secondary | ICD-10-CM | POA: Diagnosis not present

## 2018-11-29 DIAGNOSIS — G40909 Epilepsy, unspecified, not intractable, without status epilepticus: Secondary | ICD-10-CM | POA: Diagnosis not present

## 2018-11-29 DIAGNOSIS — I2510 Atherosclerotic heart disease of native coronary artery without angina pectoris: Secondary | ICD-10-CM | POA: Diagnosis not present

## 2018-11-29 DIAGNOSIS — F329 Major depressive disorder, single episode, unspecified: Secondary | ICD-10-CM | POA: Diagnosis not present

## 2018-11-29 DIAGNOSIS — E1140 Type 2 diabetes mellitus with diabetic neuropathy, unspecified: Secondary | ICD-10-CM | POA: Diagnosis not present

## 2018-11-30 DIAGNOSIS — I130 Hypertensive heart and chronic kidney disease with heart failure and stage 1 through stage 4 chronic kidney disease, or unspecified chronic kidney disease: Secondary | ICD-10-CM | POA: Diagnosis not present

## 2018-11-30 DIAGNOSIS — F329 Major depressive disorder, single episode, unspecified: Secondary | ICD-10-CM | POA: Diagnosis not present

## 2018-11-30 DIAGNOSIS — E1122 Type 2 diabetes mellitus with diabetic chronic kidney disease: Secondary | ICD-10-CM | POA: Diagnosis not present

## 2018-11-30 DIAGNOSIS — E1140 Type 2 diabetes mellitus with diabetic neuropathy, unspecified: Secondary | ICD-10-CM | POA: Diagnosis not present

## 2018-11-30 DIAGNOSIS — L89312 Pressure ulcer of right buttock, stage 2: Secondary | ICD-10-CM | POA: Diagnosis not present

## 2018-11-30 DIAGNOSIS — G40909 Epilepsy, unspecified, not intractable, without status epilepticus: Secondary | ICD-10-CM | POA: Diagnosis not present

## 2018-11-30 DIAGNOSIS — E1151 Type 2 diabetes mellitus with diabetic peripheral angiopathy without gangrene: Secondary | ICD-10-CM | POA: Diagnosis not present

## 2018-11-30 DIAGNOSIS — N183 Chronic kidney disease, stage 3 (moderate): Secondary | ICD-10-CM | POA: Diagnosis not present

## 2018-11-30 DIAGNOSIS — F1020 Alcohol dependence, uncomplicated: Secondary | ICD-10-CM | POA: Diagnosis not present

## 2018-11-30 DIAGNOSIS — I10 Essential (primary) hypertension: Secondary | ICD-10-CM | POA: Diagnosis not present

## 2018-11-30 DIAGNOSIS — E785 Hyperlipidemia, unspecified: Secondary | ICD-10-CM | POA: Diagnosis not present

## 2018-11-30 DIAGNOSIS — L97512 Non-pressure chronic ulcer of other part of right foot with fat layer exposed: Secondary | ICD-10-CM | POA: Diagnosis not present

## 2018-11-30 DIAGNOSIS — I5022 Chronic systolic (congestive) heart failure: Secondary | ICD-10-CM | POA: Diagnosis not present

## 2018-11-30 DIAGNOSIS — E1351 Other specified diabetes mellitus with diabetic peripheral angiopathy without gangrene: Secondary | ICD-10-CM | POA: Diagnosis not present

## 2018-11-30 DIAGNOSIS — S80211D Abrasion, right knee, subsequent encounter: Secondary | ICD-10-CM | POA: Diagnosis not present

## 2018-11-30 DIAGNOSIS — I2510 Atherosclerotic heart disease of native coronary artery without angina pectoris: Secondary | ICD-10-CM | POA: Diagnosis not present

## 2018-11-30 DIAGNOSIS — G9341 Metabolic encephalopathy: Secondary | ICD-10-CM | POA: Diagnosis not present

## 2018-11-30 DIAGNOSIS — E11621 Type 2 diabetes mellitus with foot ulcer: Secondary | ICD-10-CM | POA: Diagnosis not present

## 2018-11-30 DIAGNOSIS — Z87440 Personal history of urinary (tract) infections: Secondary | ICD-10-CM | POA: Diagnosis not present

## 2018-12-01 DIAGNOSIS — I2510 Atherosclerotic heart disease of native coronary artery without angina pectoris: Secondary | ICD-10-CM | POA: Diagnosis not present

## 2018-12-01 DIAGNOSIS — F329 Major depressive disorder, single episode, unspecified: Secondary | ICD-10-CM | POA: Diagnosis not present

## 2018-12-01 DIAGNOSIS — N183 Chronic kidney disease, stage 3 (moderate): Secondary | ICD-10-CM | POA: Diagnosis not present

## 2018-12-01 DIAGNOSIS — I5022 Chronic systolic (congestive) heart failure: Secondary | ICD-10-CM | POA: Diagnosis not present

## 2018-12-01 DIAGNOSIS — F1020 Alcohol dependence, uncomplicated: Secondary | ICD-10-CM | POA: Diagnosis not present

## 2018-12-01 DIAGNOSIS — I130 Hypertensive heart and chronic kidney disease with heart failure and stage 1 through stage 4 chronic kidney disease, or unspecified chronic kidney disease: Secondary | ICD-10-CM | POA: Diagnosis not present

## 2018-12-01 DIAGNOSIS — G40909 Epilepsy, unspecified, not intractable, without status epilepticus: Secondary | ICD-10-CM | POA: Diagnosis not present

## 2018-12-01 DIAGNOSIS — S80211D Abrasion, right knee, subsequent encounter: Secondary | ICD-10-CM | POA: Diagnosis not present

## 2018-12-01 DIAGNOSIS — G9341 Metabolic encephalopathy: Secondary | ICD-10-CM | POA: Diagnosis not present

## 2018-12-01 DIAGNOSIS — L97512 Non-pressure chronic ulcer of other part of right foot with fat layer exposed: Secondary | ICD-10-CM | POA: Diagnosis not present

## 2018-12-01 DIAGNOSIS — E1140 Type 2 diabetes mellitus with diabetic neuropathy, unspecified: Secondary | ICD-10-CM | POA: Diagnosis not present

## 2018-12-01 DIAGNOSIS — E11621 Type 2 diabetes mellitus with foot ulcer: Secondary | ICD-10-CM | POA: Diagnosis not present

## 2018-12-01 DIAGNOSIS — L89312 Pressure ulcer of right buttock, stage 2: Secondary | ICD-10-CM | POA: Diagnosis not present

## 2018-12-01 DIAGNOSIS — E1122 Type 2 diabetes mellitus with diabetic chronic kidney disease: Secondary | ICD-10-CM | POA: Diagnosis not present

## 2018-12-01 DIAGNOSIS — Z87440 Personal history of urinary (tract) infections: Secondary | ICD-10-CM | POA: Diagnosis not present

## 2018-12-01 DIAGNOSIS — E785 Hyperlipidemia, unspecified: Secondary | ICD-10-CM | POA: Diagnosis not present

## 2018-12-02 DIAGNOSIS — G40909 Epilepsy, unspecified, not intractable, without status epilepticus: Secondary | ICD-10-CM | POA: Diagnosis not present

## 2018-12-02 DIAGNOSIS — F329 Major depressive disorder, single episode, unspecified: Secondary | ICD-10-CM | POA: Diagnosis not present

## 2018-12-02 DIAGNOSIS — E785 Hyperlipidemia, unspecified: Secondary | ICD-10-CM | POA: Diagnosis not present

## 2018-12-02 DIAGNOSIS — S80211D Abrasion, right knee, subsequent encounter: Secondary | ICD-10-CM | POA: Diagnosis not present

## 2018-12-02 DIAGNOSIS — F1020 Alcohol dependence, uncomplicated: Secondary | ICD-10-CM | POA: Diagnosis not present

## 2018-12-02 DIAGNOSIS — N183 Chronic kidney disease, stage 3 (moderate): Secondary | ICD-10-CM | POA: Diagnosis not present

## 2018-12-02 DIAGNOSIS — Z87440 Personal history of urinary (tract) infections: Secondary | ICD-10-CM | POA: Diagnosis not present

## 2018-12-02 DIAGNOSIS — L97512 Non-pressure chronic ulcer of other part of right foot with fat layer exposed: Secondary | ICD-10-CM | POA: Diagnosis not present

## 2018-12-02 DIAGNOSIS — I130 Hypertensive heart and chronic kidney disease with heart failure and stage 1 through stage 4 chronic kidney disease, or unspecified chronic kidney disease: Secondary | ICD-10-CM | POA: Diagnosis not present

## 2018-12-02 DIAGNOSIS — G9341 Metabolic encephalopathy: Secondary | ICD-10-CM | POA: Diagnosis not present

## 2018-12-02 DIAGNOSIS — E1140 Type 2 diabetes mellitus with diabetic neuropathy, unspecified: Secondary | ICD-10-CM | POA: Diagnosis not present

## 2018-12-02 DIAGNOSIS — E1122 Type 2 diabetes mellitus with diabetic chronic kidney disease: Secondary | ICD-10-CM | POA: Diagnosis not present

## 2018-12-02 DIAGNOSIS — L89312 Pressure ulcer of right buttock, stage 2: Secondary | ICD-10-CM | POA: Diagnosis not present

## 2018-12-02 DIAGNOSIS — E11621 Type 2 diabetes mellitus with foot ulcer: Secondary | ICD-10-CM | POA: Diagnosis not present

## 2018-12-02 DIAGNOSIS — I5022 Chronic systolic (congestive) heart failure: Secondary | ICD-10-CM | POA: Diagnosis not present

## 2018-12-02 DIAGNOSIS — I2510 Atherosclerotic heart disease of native coronary artery without angina pectoris: Secondary | ICD-10-CM | POA: Diagnosis not present

## 2018-12-04 DIAGNOSIS — I2510 Atherosclerotic heart disease of native coronary artery without angina pectoris: Secondary | ICD-10-CM | POA: Diagnosis not present

## 2018-12-04 DIAGNOSIS — L89312 Pressure ulcer of right buttock, stage 2: Secondary | ICD-10-CM | POA: Diagnosis not present

## 2018-12-04 DIAGNOSIS — G9341 Metabolic encephalopathy: Secondary | ICD-10-CM | POA: Diagnosis not present

## 2018-12-04 DIAGNOSIS — G40909 Epilepsy, unspecified, not intractable, without status epilepticus: Secondary | ICD-10-CM | POA: Diagnosis not present

## 2018-12-04 DIAGNOSIS — S80211D Abrasion, right knee, subsequent encounter: Secondary | ICD-10-CM | POA: Diagnosis not present

## 2018-12-04 DIAGNOSIS — I130 Hypertensive heart and chronic kidney disease with heart failure and stage 1 through stage 4 chronic kidney disease, or unspecified chronic kidney disease: Secondary | ICD-10-CM | POA: Diagnosis not present

## 2018-12-04 DIAGNOSIS — E1122 Type 2 diabetes mellitus with diabetic chronic kidney disease: Secondary | ICD-10-CM | POA: Diagnosis not present

## 2018-12-04 DIAGNOSIS — N183 Chronic kidney disease, stage 3 (moderate): Secondary | ICD-10-CM | POA: Diagnosis not present

## 2018-12-04 DIAGNOSIS — E1140 Type 2 diabetes mellitus with diabetic neuropathy, unspecified: Secondary | ICD-10-CM | POA: Diagnosis not present

## 2018-12-04 DIAGNOSIS — F329 Major depressive disorder, single episode, unspecified: Secondary | ICD-10-CM | POA: Diagnosis not present

## 2018-12-04 DIAGNOSIS — E785 Hyperlipidemia, unspecified: Secondary | ICD-10-CM | POA: Diagnosis not present

## 2018-12-04 DIAGNOSIS — I5022 Chronic systolic (congestive) heart failure: Secondary | ICD-10-CM | POA: Diagnosis not present

## 2018-12-04 DIAGNOSIS — L97512 Non-pressure chronic ulcer of other part of right foot with fat layer exposed: Secondary | ICD-10-CM | POA: Diagnosis not present

## 2018-12-04 DIAGNOSIS — Z87440 Personal history of urinary (tract) infections: Secondary | ICD-10-CM | POA: Diagnosis not present

## 2018-12-04 DIAGNOSIS — F1020 Alcohol dependence, uncomplicated: Secondary | ICD-10-CM | POA: Diagnosis not present

## 2018-12-04 DIAGNOSIS — E11621 Type 2 diabetes mellitus with foot ulcer: Secondary | ICD-10-CM | POA: Diagnosis not present

## 2018-12-05 DIAGNOSIS — G40909 Epilepsy, unspecified, not intractable, without status epilepticus: Secondary | ICD-10-CM | POA: Diagnosis not present

## 2018-12-05 DIAGNOSIS — L89312 Pressure ulcer of right buttock, stage 2: Secondary | ICD-10-CM | POA: Diagnosis not present

## 2018-12-05 DIAGNOSIS — E785 Hyperlipidemia, unspecified: Secondary | ICD-10-CM | POA: Diagnosis not present

## 2018-12-05 DIAGNOSIS — I5022 Chronic systolic (congestive) heart failure: Secondary | ICD-10-CM | POA: Diagnosis not present

## 2018-12-05 DIAGNOSIS — E1140 Type 2 diabetes mellitus with diabetic neuropathy, unspecified: Secondary | ICD-10-CM | POA: Diagnosis not present

## 2018-12-05 DIAGNOSIS — F1020 Alcohol dependence, uncomplicated: Secondary | ICD-10-CM | POA: Diagnosis not present

## 2018-12-05 DIAGNOSIS — L97512 Non-pressure chronic ulcer of other part of right foot with fat layer exposed: Secondary | ICD-10-CM | POA: Diagnosis not present

## 2018-12-05 DIAGNOSIS — F329 Major depressive disorder, single episode, unspecified: Secondary | ICD-10-CM | POA: Diagnosis not present

## 2018-12-05 DIAGNOSIS — S80211D Abrasion, right knee, subsequent encounter: Secondary | ICD-10-CM | POA: Diagnosis not present

## 2018-12-05 DIAGNOSIS — G9341 Metabolic encephalopathy: Secondary | ICD-10-CM | POA: Diagnosis not present

## 2018-12-05 DIAGNOSIS — N183 Chronic kidney disease, stage 3 (moderate): Secondary | ICD-10-CM | POA: Diagnosis not present

## 2018-12-05 DIAGNOSIS — Z87440 Personal history of urinary (tract) infections: Secondary | ICD-10-CM | POA: Diagnosis not present

## 2018-12-05 DIAGNOSIS — I130 Hypertensive heart and chronic kidney disease with heart failure and stage 1 through stage 4 chronic kidney disease, or unspecified chronic kidney disease: Secondary | ICD-10-CM | POA: Diagnosis not present

## 2018-12-05 DIAGNOSIS — E11621 Type 2 diabetes mellitus with foot ulcer: Secondary | ICD-10-CM | POA: Diagnosis not present

## 2018-12-05 DIAGNOSIS — E1122 Type 2 diabetes mellitus with diabetic chronic kidney disease: Secondary | ICD-10-CM | POA: Diagnosis not present

## 2018-12-05 DIAGNOSIS — I2510 Atherosclerotic heart disease of native coronary artery without angina pectoris: Secondary | ICD-10-CM | POA: Diagnosis not present

## 2018-12-07 DIAGNOSIS — E1140 Type 2 diabetes mellitus with diabetic neuropathy, unspecified: Secondary | ICD-10-CM | POA: Diagnosis not present

## 2018-12-07 DIAGNOSIS — L89312 Pressure ulcer of right buttock, stage 2: Secondary | ICD-10-CM | POA: Diagnosis not present

## 2018-12-07 DIAGNOSIS — G9341 Metabolic encephalopathy: Secondary | ICD-10-CM | POA: Diagnosis not present

## 2018-12-07 DIAGNOSIS — I130 Hypertensive heart and chronic kidney disease with heart failure and stage 1 through stage 4 chronic kidney disease, or unspecified chronic kidney disease: Secondary | ICD-10-CM | POA: Diagnosis not present

## 2018-12-07 DIAGNOSIS — L97512 Non-pressure chronic ulcer of other part of right foot with fat layer exposed: Secondary | ICD-10-CM | POA: Diagnosis not present

## 2018-12-07 DIAGNOSIS — G40909 Epilepsy, unspecified, not intractable, without status epilepticus: Secondary | ICD-10-CM | POA: Diagnosis not present

## 2018-12-07 DIAGNOSIS — I5022 Chronic systolic (congestive) heart failure: Secondary | ICD-10-CM | POA: Diagnosis not present

## 2018-12-07 DIAGNOSIS — E785 Hyperlipidemia, unspecified: Secondary | ICD-10-CM | POA: Diagnosis not present

## 2018-12-07 DIAGNOSIS — E1122 Type 2 diabetes mellitus with diabetic chronic kidney disease: Secondary | ICD-10-CM | POA: Diagnosis not present

## 2018-12-07 DIAGNOSIS — I2510 Atherosclerotic heart disease of native coronary artery without angina pectoris: Secondary | ICD-10-CM | POA: Diagnosis not present

## 2018-12-07 DIAGNOSIS — Z87440 Personal history of urinary (tract) infections: Secondary | ICD-10-CM | POA: Diagnosis not present

## 2018-12-07 DIAGNOSIS — S80211D Abrasion, right knee, subsequent encounter: Secondary | ICD-10-CM | POA: Diagnosis not present

## 2018-12-07 DIAGNOSIS — F329 Major depressive disorder, single episode, unspecified: Secondary | ICD-10-CM | POA: Diagnosis not present

## 2018-12-07 DIAGNOSIS — N183 Chronic kidney disease, stage 3 (moderate): Secondary | ICD-10-CM | POA: Diagnosis not present

## 2018-12-07 DIAGNOSIS — F1020 Alcohol dependence, uncomplicated: Secondary | ICD-10-CM | POA: Diagnosis not present

## 2018-12-07 DIAGNOSIS — E11621 Type 2 diabetes mellitus with foot ulcer: Secondary | ICD-10-CM | POA: Diagnosis not present

## 2018-12-09 DIAGNOSIS — F1020 Alcohol dependence, uncomplicated: Secondary | ICD-10-CM | POA: Diagnosis not present

## 2018-12-09 DIAGNOSIS — Z87440 Personal history of urinary (tract) infections: Secondary | ICD-10-CM | POA: Diagnosis not present

## 2018-12-09 DIAGNOSIS — E1140 Type 2 diabetes mellitus with diabetic neuropathy, unspecified: Secondary | ICD-10-CM | POA: Diagnosis not present

## 2018-12-09 DIAGNOSIS — I5022 Chronic systolic (congestive) heart failure: Secondary | ICD-10-CM | POA: Diagnosis not present

## 2018-12-09 DIAGNOSIS — S80211D Abrasion, right knee, subsequent encounter: Secondary | ICD-10-CM | POA: Diagnosis not present

## 2018-12-09 DIAGNOSIS — L97512 Non-pressure chronic ulcer of other part of right foot with fat layer exposed: Secondary | ICD-10-CM | POA: Diagnosis not present

## 2018-12-09 DIAGNOSIS — E785 Hyperlipidemia, unspecified: Secondary | ICD-10-CM | POA: Diagnosis not present

## 2018-12-09 DIAGNOSIS — I2510 Atherosclerotic heart disease of native coronary artery without angina pectoris: Secondary | ICD-10-CM | POA: Diagnosis not present

## 2018-12-09 DIAGNOSIS — G9341 Metabolic encephalopathy: Secondary | ICD-10-CM | POA: Diagnosis not present

## 2018-12-09 DIAGNOSIS — I130 Hypertensive heart and chronic kidney disease with heart failure and stage 1 through stage 4 chronic kidney disease, or unspecified chronic kidney disease: Secondary | ICD-10-CM | POA: Diagnosis not present

## 2018-12-09 DIAGNOSIS — E11621 Type 2 diabetes mellitus with foot ulcer: Secondary | ICD-10-CM | POA: Diagnosis not present

## 2018-12-09 DIAGNOSIS — E1122 Type 2 diabetes mellitus with diabetic chronic kidney disease: Secondary | ICD-10-CM | POA: Diagnosis not present

## 2018-12-09 DIAGNOSIS — L89312 Pressure ulcer of right buttock, stage 2: Secondary | ICD-10-CM | POA: Diagnosis not present

## 2018-12-09 DIAGNOSIS — N183 Chronic kidney disease, stage 3 (moderate): Secondary | ICD-10-CM | POA: Diagnosis not present

## 2018-12-09 DIAGNOSIS — F329 Major depressive disorder, single episode, unspecified: Secondary | ICD-10-CM | POA: Diagnosis not present

## 2018-12-09 DIAGNOSIS — G40909 Epilepsy, unspecified, not intractable, without status epilepticus: Secondary | ICD-10-CM | POA: Diagnosis not present

## 2018-12-12 DIAGNOSIS — I2510 Atherosclerotic heart disease of native coronary artery without angina pectoris: Secondary | ICD-10-CM | POA: Diagnosis not present

## 2018-12-12 DIAGNOSIS — I130 Hypertensive heart and chronic kidney disease with heart failure and stage 1 through stage 4 chronic kidney disease, or unspecified chronic kidney disease: Secondary | ICD-10-CM | POA: Diagnosis not present

## 2018-12-12 DIAGNOSIS — G40909 Epilepsy, unspecified, not intractable, without status epilepticus: Secondary | ICD-10-CM | POA: Diagnosis not present

## 2018-12-12 DIAGNOSIS — F329 Major depressive disorder, single episode, unspecified: Secondary | ICD-10-CM | POA: Diagnosis not present

## 2018-12-12 DIAGNOSIS — L97512 Non-pressure chronic ulcer of other part of right foot with fat layer exposed: Secondary | ICD-10-CM | POA: Diagnosis not present

## 2018-12-12 DIAGNOSIS — Z87440 Personal history of urinary (tract) infections: Secondary | ICD-10-CM | POA: Diagnosis not present

## 2018-12-12 DIAGNOSIS — E785 Hyperlipidemia, unspecified: Secondary | ICD-10-CM | POA: Diagnosis not present

## 2018-12-12 DIAGNOSIS — S80211D Abrasion, right knee, subsequent encounter: Secondary | ICD-10-CM | POA: Diagnosis not present

## 2018-12-12 DIAGNOSIS — E11621 Type 2 diabetes mellitus with foot ulcer: Secondary | ICD-10-CM | POA: Diagnosis not present

## 2018-12-12 DIAGNOSIS — F1020 Alcohol dependence, uncomplicated: Secondary | ICD-10-CM | POA: Diagnosis not present

## 2018-12-12 DIAGNOSIS — I5022 Chronic systolic (congestive) heart failure: Secondary | ICD-10-CM | POA: Diagnosis not present

## 2018-12-12 DIAGNOSIS — G9341 Metabolic encephalopathy: Secondary | ICD-10-CM | POA: Diagnosis not present

## 2018-12-12 DIAGNOSIS — E1122 Type 2 diabetes mellitus with diabetic chronic kidney disease: Secondary | ICD-10-CM | POA: Diagnosis not present

## 2018-12-12 DIAGNOSIS — E1140 Type 2 diabetes mellitus with diabetic neuropathy, unspecified: Secondary | ICD-10-CM | POA: Diagnosis not present

## 2018-12-12 DIAGNOSIS — N183 Chronic kidney disease, stage 3 (moderate): Secondary | ICD-10-CM | POA: Diagnosis not present

## 2018-12-12 DIAGNOSIS — L89312 Pressure ulcer of right buttock, stage 2: Secondary | ICD-10-CM | POA: Diagnosis not present

## 2018-12-13 DIAGNOSIS — E11621 Type 2 diabetes mellitus with foot ulcer: Secondary | ICD-10-CM | POA: Diagnosis not present

## 2018-12-13 DIAGNOSIS — I130 Hypertensive heart and chronic kidney disease with heart failure and stage 1 through stage 4 chronic kidney disease, or unspecified chronic kidney disease: Secondary | ICD-10-CM | POA: Diagnosis not present

## 2018-12-13 DIAGNOSIS — Z87440 Personal history of urinary (tract) infections: Secondary | ICD-10-CM | POA: Diagnosis not present

## 2018-12-13 DIAGNOSIS — L89312 Pressure ulcer of right buttock, stage 2: Secondary | ICD-10-CM | POA: Diagnosis not present

## 2018-12-13 DIAGNOSIS — E1122 Type 2 diabetes mellitus with diabetic chronic kidney disease: Secondary | ICD-10-CM | POA: Diagnosis not present

## 2018-12-13 DIAGNOSIS — I5022 Chronic systolic (congestive) heart failure: Secondary | ICD-10-CM | POA: Diagnosis not present

## 2018-12-13 DIAGNOSIS — G9341 Metabolic encephalopathy: Secondary | ICD-10-CM | POA: Diagnosis not present

## 2018-12-13 DIAGNOSIS — L89890 Pressure ulcer of other site, unstageable: Secondary | ICD-10-CM | POA: Diagnosis not present

## 2018-12-13 DIAGNOSIS — L89211 Pressure ulcer of right hip, stage 1: Secondary | ICD-10-CM | POA: Diagnosis not present

## 2018-12-13 DIAGNOSIS — N183 Chronic kidney disease, stage 3 (moderate): Secondary | ICD-10-CM | POA: Diagnosis not present

## 2018-12-13 DIAGNOSIS — E785 Hyperlipidemia, unspecified: Secondary | ICD-10-CM | POA: Diagnosis not present

## 2018-12-13 DIAGNOSIS — L97512 Non-pressure chronic ulcer of other part of right foot with fat layer exposed: Secondary | ICD-10-CM | POA: Diagnosis not present

## 2018-12-13 DIAGNOSIS — E1140 Type 2 diabetes mellitus with diabetic neuropathy, unspecified: Secondary | ICD-10-CM | POA: Diagnosis not present

## 2018-12-13 DIAGNOSIS — F329 Major depressive disorder, single episode, unspecified: Secondary | ICD-10-CM | POA: Diagnosis not present

## 2018-12-13 DIAGNOSIS — S80211D Abrasion, right knee, subsequent encounter: Secondary | ICD-10-CM | POA: Diagnosis not present

## 2018-12-13 DIAGNOSIS — I2510 Atherosclerotic heart disease of native coronary artery without angina pectoris: Secondary | ICD-10-CM | POA: Diagnosis not present

## 2018-12-13 DIAGNOSIS — G40909 Epilepsy, unspecified, not intractable, without status epilepticus: Secondary | ICD-10-CM | POA: Diagnosis not present

## 2018-12-13 DIAGNOSIS — F1020 Alcohol dependence, uncomplicated: Secondary | ICD-10-CM | POA: Diagnosis not present

## 2018-12-13 DIAGNOSIS — S91312A Laceration without foreign body, left foot, initial encounter: Secondary | ICD-10-CM | POA: Diagnosis not present

## 2018-12-14 DIAGNOSIS — Z794 Long term (current) use of insulin: Secondary | ICD-10-CM | POA: Diagnosis not present

## 2018-12-14 DIAGNOSIS — M6281 Muscle weakness (generalized): Secondary | ICD-10-CM | POA: Diagnosis not present

## 2018-12-14 DIAGNOSIS — I2510 Atherosclerotic heart disease of native coronary artery without angina pectoris: Secondary | ICD-10-CM | POA: Diagnosis not present

## 2018-12-14 DIAGNOSIS — E1151 Type 2 diabetes mellitus with diabetic peripheral angiopathy without gangrene: Secondary | ICD-10-CM | POA: Diagnosis not present

## 2018-12-14 DIAGNOSIS — I10 Essential (primary) hypertension: Secondary | ICD-10-CM | POA: Diagnosis not present

## 2018-12-14 DIAGNOSIS — R2689 Other abnormalities of gait and mobility: Secondary | ICD-10-CM | POA: Diagnosis not present

## 2018-12-14 DIAGNOSIS — S91301A Unspecified open wound, right foot, initial encounter: Secondary | ICD-10-CM | POA: Diagnosis not present

## 2018-12-14 DIAGNOSIS — I5022 Chronic systolic (congestive) heart failure: Secondary | ICD-10-CM | POA: Diagnosis not present

## 2018-12-15 DIAGNOSIS — L89312 Pressure ulcer of right buttock, stage 2: Secondary | ICD-10-CM | POA: Diagnosis not present

## 2018-12-15 DIAGNOSIS — G9341 Metabolic encephalopathy: Secondary | ICD-10-CM | POA: Diagnosis not present

## 2018-12-15 DIAGNOSIS — E785 Hyperlipidemia, unspecified: Secondary | ICD-10-CM | POA: Diagnosis not present

## 2018-12-15 DIAGNOSIS — B351 Tinea unguium: Secondary | ICD-10-CM | POA: Diagnosis not present

## 2018-12-15 DIAGNOSIS — E11621 Type 2 diabetes mellitus with foot ulcer: Secondary | ICD-10-CM | POA: Diagnosis not present

## 2018-12-15 DIAGNOSIS — E1151 Type 2 diabetes mellitus with diabetic peripheral angiopathy without gangrene: Secondary | ICD-10-CM | POA: Diagnosis not present

## 2018-12-15 DIAGNOSIS — I5022 Chronic systolic (congestive) heart failure: Secondary | ICD-10-CM | POA: Diagnosis not present

## 2018-12-15 DIAGNOSIS — I2510 Atherosclerotic heart disease of native coronary artery without angina pectoris: Secondary | ICD-10-CM | POA: Diagnosis not present

## 2018-12-15 DIAGNOSIS — Z87440 Personal history of urinary (tract) infections: Secondary | ICD-10-CM | POA: Diagnosis not present

## 2018-12-15 DIAGNOSIS — E1122 Type 2 diabetes mellitus with diabetic chronic kidney disease: Secondary | ICD-10-CM | POA: Diagnosis not present

## 2018-12-15 DIAGNOSIS — N183 Chronic kidney disease, stage 3 (moderate): Secondary | ICD-10-CM | POA: Diagnosis not present

## 2018-12-15 DIAGNOSIS — S80211D Abrasion, right knee, subsequent encounter: Secondary | ICD-10-CM | POA: Diagnosis not present

## 2018-12-15 DIAGNOSIS — L97512 Non-pressure chronic ulcer of other part of right foot with fat layer exposed: Secondary | ICD-10-CM | POA: Diagnosis not present

## 2018-12-15 DIAGNOSIS — I130 Hypertensive heart and chronic kidney disease with heart failure and stage 1 through stage 4 chronic kidney disease, or unspecified chronic kidney disease: Secondary | ICD-10-CM | POA: Diagnosis not present

## 2018-12-15 DIAGNOSIS — F329 Major depressive disorder, single episode, unspecified: Secondary | ICD-10-CM | POA: Diagnosis not present

## 2018-12-15 DIAGNOSIS — E1351 Other specified diabetes mellitus with diabetic peripheral angiopathy without gangrene: Secondary | ICD-10-CM | POA: Diagnosis not present

## 2018-12-15 DIAGNOSIS — G40909 Epilepsy, unspecified, not intractable, without status epilepticus: Secondary | ICD-10-CM | POA: Diagnosis not present

## 2018-12-15 DIAGNOSIS — F1020 Alcohol dependence, uncomplicated: Secondary | ICD-10-CM | POA: Diagnosis not present

## 2018-12-15 DIAGNOSIS — E1140 Type 2 diabetes mellitus with diabetic neuropathy, unspecified: Secondary | ICD-10-CM | POA: Diagnosis not present

## 2018-12-16 DIAGNOSIS — I5022 Chronic systolic (congestive) heart failure: Secondary | ICD-10-CM | POA: Diagnosis not present

## 2018-12-16 DIAGNOSIS — L89312 Pressure ulcer of right buttock, stage 2: Secondary | ICD-10-CM | POA: Diagnosis not present

## 2018-12-16 DIAGNOSIS — I130 Hypertensive heart and chronic kidney disease with heart failure and stage 1 through stage 4 chronic kidney disease, or unspecified chronic kidney disease: Secondary | ICD-10-CM | POA: Diagnosis not present

## 2018-12-16 DIAGNOSIS — I2510 Atherosclerotic heart disease of native coronary artery without angina pectoris: Secondary | ICD-10-CM | POA: Diagnosis not present

## 2018-12-16 DIAGNOSIS — F329 Major depressive disorder, single episode, unspecified: Secondary | ICD-10-CM | POA: Diagnosis not present

## 2018-12-16 DIAGNOSIS — S80211D Abrasion, right knee, subsequent encounter: Secondary | ICD-10-CM | POA: Diagnosis not present

## 2018-12-16 DIAGNOSIS — E1140 Type 2 diabetes mellitus with diabetic neuropathy, unspecified: Secondary | ICD-10-CM | POA: Diagnosis not present

## 2018-12-16 DIAGNOSIS — G40909 Epilepsy, unspecified, not intractable, without status epilepticus: Secondary | ICD-10-CM | POA: Diagnosis not present

## 2018-12-16 DIAGNOSIS — E1122 Type 2 diabetes mellitus with diabetic chronic kidney disease: Secondary | ICD-10-CM | POA: Diagnosis not present

## 2018-12-16 DIAGNOSIS — G9341 Metabolic encephalopathy: Secondary | ICD-10-CM | POA: Diagnosis not present

## 2018-12-16 DIAGNOSIS — Z87440 Personal history of urinary (tract) infections: Secondary | ICD-10-CM | POA: Diagnosis not present

## 2018-12-16 DIAGNOSIS — E785 Hyperlipidemia, unspecified: Secondary | ICD-10-CM | POA: Diagnosis not present

## 2018-12-16 DIAGNOSIS — N183 Chronic kidney disease, stage 3 (moderate): Secondary | ICD-10-CM | POA: Diagnosis not present

## 2018-12-16 DIAGNOSIS — L97512 Non-pressure chronic ulcer of other part of right foot with fat layer exposed: Secondary | ICD-10-CM | POA: Diagnosis not present

## 2018-12-16 DIAGNOSIS — F1020 Alcohol dependence, uncomplicated: Secondary | ICD-10-CM | POA: Diagnosis not present

## 2018-12-16 DIAGNOSIS — E11621 Type 2 diabetes mellitus with foot ulcer: Secondary | ICD-10-CM | POA: Diagnosis not present

## 2018-12-19 DIAGNOSIS — L97512 Non-pressure chronic ulcer of other part of right foot with fat layer exposed: Secondary | ICD-10-CM | POA: Diagnosis not present

## 2018-12-19 DIAGNOSIS — E11621 Type 2 diabetes mellitus with foot ulcer: Secondary | ICD-10-CM | POA: Diagnosis not present

## 2018-12-19 DIAGNOSIS — S80211D Abrasion, right knee, subsequent encounter: Secondary | ICD-10-CM | POA: Diagnosis not present

## 2018-12-19 DIAGNOSIS — L89312 Pressure ulcer of right buttock, stage 2: Secondary | ICD-10-CM | POA: Diagnosis not present

## 2018-12-19 DIAGNOSIS — F329 Major depressive disorder, single episode, unspecified: Secondary | ICD-10-CM | POA: Diagnosis not present

## 2018-12-19 DIAGNOSIS — E1140 Type 2 diabetes mellitus with diabetic neuropathy, unspecified: Secondary | ICD-10-CM | POA: Diagnosis not present

## 2018-12-19 DIAGNOSIS — G9341 Metabolic encephalopathy: Secondary | ICD-10-CM | POA: Diagnosis not present

## 2018-12-19 DIAGNOSIS — F1020 Alcohol dependence, uncomplicated: Secondary | ICD-10-CM | POA: Diagnosis not present

## 2018-12-19 DIAGNOSIS — I130 Hypertensive heart and chronic kidney disease with heart failure and stage 1 through stage 4 chronic kidney disease, or unspecified chronic kidney disease: Secondary | ICD-10-CM | POA: Diagnosis not present

## 2018-12-19 DIAGNOSIS — R109 Unspecified abdominal pain: Secondary | ICD-10-CM | POA: Diagnosis not present

## 2018-12-19 DIAGNOSIS — I2510 Atherosclerotic heart disease of native coronary artery without angina pectoris: Secondary | ICD-10-CM | POA: Diagnosis not present

## 2018-12-19 DIAGNOSIS — R140 Abdominal distension (gaseous): Secondary | ICD-10-CM | POA: Diagnosis not present

## 2018-12-19 DIAGNOSIS — E1122 Type 2 diabetes mellitus with diabetic chronic kidney disease: Secondary | ICD-10-CM | POA: Diagnosis not present

## 2018-12-19 DIAGNOSIS — Z87440 Personal history of urinary (tract) infections: Secondary | ICD-10-CM | POA: Diagnosis not present

## 2018-12-19 DIAGNOSIS — I5022 Chronic systolic (congestive) heart failure: Secondary | ICD-10-CM | POA: Diagnosis not present

## 2018-12-19 DIAGNOSIS — E785 Hyperlipidemia, unspecified: Secondary | ICD-10-CM | POA: Diagnosis not present

## 2018-12-19 DIAGNOSIS — N183 Chronic kidney disease, stage 3 (moderate): Secondary | ICD-10-CM | POA: Diagnosis not present

## 2018-12-19 DIAGNOSIS — G40909 Epilepsy, unspecified, not intractable, without status epilepticus: Secondary | ICD-10-CM | POA: Diagnosis not present

## 2018-12-20 DIAGNOSIS — L97512 Non-pressure chronic ulcer of other part of right foot with fat layer exposed: Secondary | ICD-10-CM | POA: Diagnosis not present

## 2018-12-20 DIAGNOSIS — G9341 Metabolic encephalopathy: Secondary | ICD-10-CM | POA: Diagnosis not present

## 2018-12-20 DIAGNOSIS — E1140 Type 2 diabetes mellitus with diabetic neuropathy, unspecified: Secondary | ICD-10-CM | POA: Diagnosis not present

## 2018-12-20 DIAGNOSIS — F329 Major depressive disorder, single episode, unspecified: Secondary | ICD-10-CM | POA: Diagnosis not present

## 2018-12-20 DIAGNOSIS — E11621 Type 2 diabetes mellitus with foot ulcer: Secondary | ICD-10-CM | POA: Diagnosis not present

## 2018-12-20 DIAGNOSIS — I5022 Chronic systolic (congestive) heart failure: Secondary | ICD-10-CM | POA: Diagnosis not present

## 2018-12-20 DIAGNOSIS — I2510 Atherosclerotic heart disease of native coronary artery without angina pectoris: Secondary | ICD-10-CM | POA: Diagnosis not present

## 2018-12-20 DIAGNOSIS — Z87440 Personal history of urinary (tract) infections: Secondary | ICD-10-CM | POA: Diagnosis not present

## 2018-12-20 DIAGNOSIS — E1122 Type 2 diabetes mellitus with diabetic chronic kidney disease: Secondary | ICD-10-CM | POA: Diagnosis not present

## 2018-12-20 DIAGNOSIS — S80211D Abrasion, right knee, subsequent encounter: Secondary | ICD-10-CM | POA: Diagnosis not present

## 2018-12-20 DIAGNOSIS — G40909 Epilepsy, unspecified, not intractable, without status epilepticus: Secondary | ICD-10-CM | POA: Diagnosis not present

## 2018-12-20 DIAGNOSIS — I130 Hypertensive heart and chronic kidney disease with heart failure and stage 1 through stage 4 chronic kidney disease, or unspecified chronic kidney disease: Secondary | ICD-10-CM | POA: Diagnosis not present

## 2018-12-20 DIAGNOSIS — E785 Hyperlipidemia, unspecified: Secondary | ICD-10-CM | POA: Diagnosis not present

## 2018-12-20 DIAGNOSIS — L89312 Pressure ulcer of right buttock, stage 2: Secondary | ICD-10-CM | POA: Diagnosis not present

## 2018-12-20 DIAGNOSIS — N183 Chronic kidney disease, stage 3 (moderate): Secondary | ICD-10-CM | POA: Diagnosis not present

## 2018-12-20 DIAGNOSIS — F1020 Alcohol dependence, uncomplicated: Secondary | ICD-10-CM | POA: Diagnosis not present

## 2018-12-22 DIAGNOSIS — R0602 Shortness of breath: Secondary | ICD-10-CM | POA: Diagnosis not present

## 2018-12-28 DIAGNOSIS — S91301A Unspecified open wound, right foot, initial encounter: Secondary | ICD-10-CM | POA: Diagnosis not present

## 2018-12-28 DIAGNOSIS — J189 Pneumonia, unspecified organism: Secondary | ICD-10-CM | POA: Diagnosis not present

## 2018-12-28 DIAGNOSIS — I10 Essential (primary) hypertension: Secondary | ICD-10-CM | POA: Diagnosis not present

## 2018-12-28 DIAGNOSIS — E1151 Type 2 diabetes mellitus with diabetic peripheral angiopathy without gangrene: Secondary | ICD-10-CM | POA: Diagnosis not present

## 2019-01-04 DIAGNOSIS — R109 Unspecified abdominal pain: Secondary | ICD-10-CM | POA: Diagnosis not present

## 2019-01-04 DIAGNOSIS — E1151 Type 2 diabetes mellitus with diabetic peripheral angiopathy without gangrene: Secondary | ICD-10-CM | POA: Diagnosis not present

## 2019-01-04 DIAGNOSIS — R140 Abdominal distension (gaseous): Secondary | ICD-10-CM | POA: Diagnosis not present

## 2019-01-04 DIAGNOSIS — S91301A Unspecified open wound, right foot, initial encounter: Secondary | ICD-10-CM | POA: Diagnosis not present

## 2019-01-04 DIAGNOSIS — S31000D Unspecified open wound of lower back and pelvis without penetration into retroperitoneum, subsequent encounter: Secondary | ICD-10-CM | POA: Diagnosis not present

## 2019-01-04 DIAGNOSIS — I10 Essential (primary) hypertension: Secondary | ICD-10-CM | POA: Diagnosis not present

## 2019-01-10 ENCOUNTER — Other Ambulatory Visit: Payer: Self-pay

## 2019-01-10 ENCOUNTER — Inpatient Hospital Stay (HOSPITAL_COMMUNITY)
Admission: EM | Admit: 2019-01-10 | Discharge: 2019-01-19 | DRG: 432 | Disposition: A | Discharge status: Skilled Nursing Facility | Payer: Medicare Other | Source: Skilled Nursing Facility | Attending: Family Medicine | Admitting: Family Medicine

## 2019-01-10 ENCOUNTER — Encounter (HOSPITAL_COMMUNITY): Payer: Self-pay | Admitting: Emergency Medicine

## 2019-01-10 DIAGNOSIS — Z79899 Other long term (current) drug therapy: Secondary | ICD-10-CM | POA: Diagnosis not present

## 2019-01-10 DIAGNOSIS — R001 Bradycardia, unspecified: Secondary | ICD-10-CM

## 2019-01-10 DIAGNOSIS — R609 Edema, unspecified: Secondary | ICD-10-CM

## 2019-01-10 DIAGNOSIS — K7460 Unspecified cirrhosis of liver: Secondary | ICD-10-CM | POA: Diagnosis not present

## 2019-01-10 DIAGNOSIS — E8770 Fluid overload, unspecified: Secondary | ICD-10-CM

## 2019-01-10 DIAGNOSIS — N281 Cyst of kidney, acquired: Secondary | ICD-10-CM | POA: Diagnosis not present

## 2019-01-10 DIAGNOSIS — Z794 Long term (current) use of insulin: Secondary | ICD-10-CM | POA: Diagnosis not present

## 2019-01-10 DIAGNOSIS — I129 Hypertensive chronic kidney disease with stage 1 through stage 4 chronic kidney disease, or unspecified chronic kidney disease: Secondary | ICD-10-CM | POA: Diagnosis not present

## 2019-01-10 DIAGNOSIS — I130 Hypertensive heart and chronic kidney disease with heart failure and stage 1 through stage 4 chronic kidney disease, or unspecified chronic kidney disease: Secondary | ICD-10-CM | POA: Diagnosis present

## 2019-01-10 DIAGNOSIS — R2689 Other abnormalities of gait and mobility: Secondary | ICD-10-CM | POA: Diagnosis not present

## 2019-01-10 DIAGNOSIS — N183 Chronic kidney disease, stage 3 unspecified: Secondary | ICD-10-CM | POA: Diagnosis present

## 2019-01-10 DIAGNOSIS — D649 Anemia, unspecified: Secondary | ICD-10-CM | POA: Diagnosis present

## 2019-01-10 DIAGNOSIS — E1129 Type 2 diabetes mellitus with other diabetic kidney complication: Secondary | ICD-10-CM | POA: Diagnosis present

## 2019-01-10 DIAGNOSIS — Z20828 Contact with and (suspected) exposure to other viral communicable diseases: Secondary | ICD-10-CM | POA: Diagnosis present

## 2019-01-10 DIAGNOSIS — R188 Other ascites: Secondary | ICD-10-CM

## 2019-01-10 DIAGNOSIS — Z7189 Other specified counseling: Secondary | ICD-10-CM | POA: Diagnosis not present

## 2019-01-10 DIAGNOSIS — Z515 Encounter for palliative care: Secondary | ICD-10-CM | POA: Diagnosis not present

## 2019-01-10 DIAGNOSIS — I5043 Acute on chronic combined systolic (congestive) and diastolic (congestive) heart failure: Secondary | ICD-10-CM | POA: Diagnosis present

## 2019-01-10 DIAGNOSIS — Z8546 Personal history of malignant neoplasm of prostate: Secondary | ICD-10-CM

## 2019-01-10 DIAGNOSIS — E1122 Type 2 diabetes mellitus with diabetic chronic kidney disease: Secondary | ICD-10-CM | POA: Diagnosis present

## 2019-01-10 DIAGNOSIS — G9341 Metabolic encephalopathy: Secondary | ICD-10-CM | POA: Diagnosis present

## 2019-01-10 DIAGNOSIS — M6281 Muscle weakness (generalized): Secondary | ICD-10-CM | POA: Diagnosis not present

## 2019-01-10 DIAGNOSIS — K7031 Alcoholic cirrhosis of liver with ascites: Principal | ICD-10-CM | POA: Diagnosis present

## 2019-01-10 DIAGNOSIS — I959 Hypotension, unspecified: Secondary | ICD-10-CM | POA: Diagnosis present

## 2019-01-10 DIAGNOSIS — I472 Ventricular tachycardia: Secondary | ICD-10-CM | POA: Diagnosis present

## 2019-01-10 DIAGNOSIS — E872 Acidosis: Secondary | ICD-10-CM | POA: Diagnosis present

## 2019-01-10 DIAGNOSIS — R1310 Dysphagia, unspecified: Secondary | ICD-10-CM | POA: Diagnosis not present

## 2019-01-10 DIAGNOSIS — E1142 Type 2 diabetes mellitus with diabetic polyneuropathy: Secondary | ICD-10-CM | POA: Diagnosis present

## 2019-01-10 DIAGNOSIS — F1020 Alcohol dependence, uncomplicated: Secondary | ICD-10-CM | POA: Diagnosis present

## 2019-01-10 DIAGNOSIS — Z833 Family history of diabetes mellitus: Secondary | ICD-10-CM | POA: Diagnosis not present

## 2019-01-10 DIAGNOSIS — Z66 Do not resuscitate: Secondary | ICD-10-CM | POA: Diagnosis present

## 2019-01-10 DIAGNOSIS — I5022 Chronic systolic (congestive) heart failure: Secondary | ICD-10-CM | POA: Diagnosis not present

## 2019-01-10 DIAGNOSIS — R627 Adult failure to thrive: Secondary | ICD-10-CM | POA: Diagnosis present

## 2019-01-10 DIAGNOSIS — I429 Cardiomyopathy, unspecified: Secondary | ICD-10-CM | POA: Diagnosis present

## 2019-01-10 DIAGNOSIS — Z955 Presence of coronary angioplasty implant and graft: Secondary | ICD-10-CM

## 2019-01-10 DIAGNOSIS — Z7982 Long term (current) use of aspirin: Secondary | ICD-10-CM

## 2019-01-10 DIAGNOSIS — K7290 Hepatic failure, unspecified without coma: Secondary | ICD-10-CM | POA: Diagnosis not present

## 2019-01-10 DIAGNOSIS — N179 Acute kidney failure, unspecified: Secondary | ICD-10-CM | POA: Diagnosis present

## 2019-01-10 DIAGNOSIS — E8809 Other disorders of plasma-protein metabolism, not elsewhere classified: Secondary | ICD-10-CM | POA: Diagnosis present

## 2019-01-10 DIAGNOSIS — R601 Generalized edema: Secondary | ICD-10-CM | POA: Diagnosis not present

## 2019-01-10 DIAGNOSIS — D696 Thrombocytopenia, unspecified: Secondary | ICD-10-CM | POA: Diagnosis present

## 2019-01-10 DIAGNOSIS — J90 Pleural effusion, not elsewhere classified: Secondary | ICD-10-CM | POA: Diagnosis not present

## 2019-01-10 DIAGNOSIS — E785 Hyperlipidemia, unspecified: Secondary | ICD-10-CM | POA: Diagnosis present

## 2019-01-10 DIAGNOSIS — R32 Unspecified urinary incontinence: Secondary | ICD-10-CM | POA: Diagnosis present

## 2019-01-10 DIAGNOSIS — I2510 Atherosclerotic heart disease of native coronary artery without angina pectoris: Secondary | ICD-10-CM | POA: Diagnosis present

## 2019-01-10 DIAGNOSIS — R140 Abdominal distension (gaseous): Secondary | ICD-10-CM | POA: Diagnosis not present

## 2019-01-10 DIAGNOSIS — G40909 Epilepsy, unspecified, not intractable, without status epilepticus: Secondary | ICD-10-CM | POA: Diagnosis present

## 2019-01-10 DIAGNOSIS — F0390 Unspecified dementia without behavioral disturbance: Secondary | ICD-10-CM | POA: Diagnosis present

## 2019-01-10 DIAGNOSIS — R339 Retention of urine, unspecified: Secondary | ICD-10-CM | POA: Diagnosis present

## 2019-01-10 LAB — COMPREHENSIVE METABOLIC PANEL
ALT: 69 U/L — ABNORMAL HIGH (ref 0–44)
AST: 117 U/L — ABNORMAL HIGH (ref 15–41)
Albumin: 2.6 g/dL — ABNORMAL LOW (ref 3.5–5.0)
Alkaline Phosphatase: 373 U/L — ABNORMAL HIGH (ref 38–126)
Anion gap: 12 (ref 5–15)
BUN: 80 mg/dL — ABNORMAL HIGH (ref 8–23)
CO2: 17 mmol/L — ABNORMAL LOW (ref 22–32)
Calcium: 8.7 mg/dL — ABNORMAL LOW (ref 8.9–10.3)
Chloride: 106 mmol/L (ref 98–111)
Creatinine, Ser: 3.68 mg/dL — ABNORMAL HIGH (ref 0.61–1.24)
GFR calc Af Amer: 17 mL/min — ABNORMAL LOW (ref >60)
GFR calc non Af Amer: 14 mL/min — ABNORMAL LOW (ref >60)
Glucose, Bld: 109 mg/dL — ABNORMAL HIGH (ref 70–99)
Potassium: 4.4 mmol/L (ref 3.5–5.1)
Sodium: 135 mmol/L (ref 135–145)
Total Bilirubin: 1.2 mg/dL (ref 0.3–1.2)
Total Protein: 6 g/dL — ABNORMAL LOW (ref 6.5–8.1)

## 2019-01-10 LAB — CBC WITH DIFFERENTIAL/PLATELET
Abs Immature Granulocytes: 0.02 10*3/uL (ref 0.00–0.07)
Basophils Absolute: 0 10*3/uL (ref 0.0–0.1)
Basophils Relative: 0 %
Eosinophils Absolute: 0.5 10*3/uL (ref 0.0–0.5)
Eosinophils Relative: 6 %
HCT: 37.5 % — ABNORMAL LOW (ref 39.0–52.0)
Hemoglobin: 12.4 g/dL — ABNORMAL LOW (ref 13.0–17.0)
Immature Granulocytes: 0 %
Lymphocytes Relative: 26 %
Lymphs Abs: 2 10*3/uL (ref 0.7–4.0)
MCH: 32.9 pg (ref 26.0–34.0)
MCHC: 33.1 g/dL (ref 30.0–36.0)
MCV: 99.5 fL (ref 80.0–100.0)
Monocytes Absolute: 0.9 10*3/uL (ref 0.1–1.0)
Monocytes Relative: 11 %
Neutro Abs: 4.5 10*3/uL (ref 1.7–7.7)
Neutrophils Relative %: 57 %
Platelets: 205 10*3/uL (ref 150–400)
RBC: 3.77 MIL/uL — ABNORMAL LOW (ref 4.22–5.81)
RDW: 14.1 % (ref 11.5–15.5)
WBC: 7.9 10*3/uL (ref 4.0–10.5)
nRBC: 0 % (ref 0.0–0.2)

## 2019-01-10 LAB — TROPONIN I (HIGH SENSITIVITY): Troponin I (High Sensitivity): 55 ng/L — ABNORMAL HIGH (ref <18)

## 2019-01-10 LAB — BRAIN NATRIURETIC PEPTIDE: B Natriuretic Peptide: 181.9 pg/mL — ABNORMAL HIGH (ref 0.0–100.0)

## 2019-01-10 LAB — PROTIME-INR
INR: 1.2 (ref 0.8–1.2)
Prothrombin Time: 15.5 seconds — ABNORMAL HIGH (ref 11.4–15.2)

## 2019-01-10 MED ORDER — FUROSEMIDE 10 MG/ML IJ SOLN
40.0000 mg | Freq: Once | INTRAMUSCULAR | Status: AC
  Administered 2019-01-10: 22:00:00 40 mg via INTRAVENOUS
  Filled 2019-01-10: qty 4

## 2019-01-10 NOTE — ED Provider Notes (Signed)
[facility] EMERGENCY DEPARTMENT Provider Note   CSN: [id number] Arrival date & time: 01/10/19  2059     History   Chief Complaint Chief Complaint  Patient presents with  . Lower legs/Abdominal Edema    HPI Daniel [name] is a 85 y.o. male.     HPI   85 year old male sent from his nursing facility for evaluation of lower extremity edema up into the abdomen.  Patient himself is not a great historian.  He cannot tell me much with regards to an exact timeline of symptoms..  He denies any acute pain.  Denies any respiratory complaints.  Past Medical History:  Diagnosis Date  . Alcohol abuse   . Cancer [facility])    prostate  . Cardiomyopathy ([place])   . CHF (congestive heart failure) ([place])   . Complication of anesthesia    Hx of seizure when put to sleep for surgery on 08/2009  . Constipation   . Coronary artery disease   . Diabetes mellitus without complication ([place])   . H/O hiatal hernia   . Hyperlipidemia   . Hypertension   . Peripheral neuropathy   . Seizures ([place])   . Urinary incontinence     Patient Active Problem List   Diagnosis Date Noted  . Evaluation by psychiatric service required   . Acute metabolic encephalopathy 00/92/3300  . Gait instability 10/12/2018  . HLD (hyperlipidemia) 10/12/2018  . Elevated troponin 10/12/2018  . Hypoglycemia 07/19/2017  . Hypotension 07/19/2017  . Normochromic normocytic anemia 07/19/2017  . Thrombocytopenia ([place]) 07/19/2017  . CKD (chronic kidney disease), stage III 07/19/2017  . Seizure ([place]) 06/16/2017  . Type II diabetes mellitus with neurological manifestations ([place]) 03/05/2015  . Porokeratosis 03/05/2015  . Chronic systolic CHF (congestive heart failure) ([place]) 01/28/2015  . Coronary artery disease 01/28/2015  . Bruit of left carotid artery 01/28/2015  . 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 H/O detached retina repair 08/23/2014  . Acute on chronic renal failure ([place])  04/17/2013  . Dehydration 04/17/2013  . Alcohol dependence ([place]) 04/17/2013  . Transaminasemia 04/17/2013  . Type II diabetes mellitus with renal manifestations ([place]) 04/17/2013  . Near syncope 04/17/2013  . UTI (lower urinary tract infection) 04/17/2013  . Hematuria 04/17/2013    Past Surgical History:  Procedure Laterality Date  . CATARACT EXTRACTION, BILATERAL    . CORONARY ANGIOPLASTY     stents x 2  . CORONARY STENT PLACEMENT     2004  . PROSTATE SURGERY  2008   SEED IMPLANTS  . RADIOACTIVE SEED IMPLANT     prostate  . TONSILLECTOMY     1940's       Home Medications    Prior to Admission medications   Medication Sig Start Date End Date Taking? Authorizing Provider  ALPRAZolam (XANAX) 0.25 MG tablet Take 1 tablet (0.25 mg total) by mouth 2 (two) times daily as needed for anxiety. 10/14/18   Cristal Ford, DO  aspirin 81 MG tablet Take 81 mg by mouth daily.     [provider]  finasteride (PROSCAR) 5 MG tablet Take 5 mg by mouth daily.  01/09/13   [provider]  folic acid (FOLVITE) 1 MG tablet Take 1 tablet (1 mg total) by mouth daily. 10/15/18   Mikhail, Velta Addison, DO  insulin lispro (HUMALOG) 100 UNIT/ML KwikPen Inject 11 Units into the skin 2 (two) times a day. 06/02/18   [provider]  latanoprost (XALATAN) 0.005 % ophthalmic solution Place  1 drop into both eyes at bedtime.    [provider]  levETIRAcetam (KEPPRA) 500 MG tablet Take 1 tablet (500 mg total) by mouth 2 (two) times daily. 01/24/18   Black, Lezlie Octave, NP  magnesium oxide (MAG-OX) 400 MG tablet Take 400 mg by mouth daily.    [provider]  Multiple Vitamin (MULTIVITAMIN) tablet Take 1 tablet by mouth every evening.     [provider]  rosuvastatin (CRESTOR) 20 MG tablet Take 10 mg by mouth every evening.     [provider]  thiamine 100 MG tablet Take 1 tablet (100 mg total) by mouth daily. 10/15/18   Mikhail, Maryann, DO  TOUJEO SOLOSTAR  300 UNIT/ML SOPN Inject 18 Units into the skin at bedtime. 06/02/18   [provider]  vitamin C (ASCORBIC ACID) 500 MG tablet Take 500 mg by mouth every evening.     [provider]    Family History Family History  Problem Relation Age of Onset  . Diabetes Mother   . Heart disease Father   . Heart disease Brother   . Diabetes Brother     Social History Social History   Tobacco Use  . Smoking status: Never Smoker  . Smokeless tobacco: Never Used  Substance Use Topics  . Alcohol use: Yes  . Drug use: No   Allergies   Patient has no known allergies.  Review of Systems Review of Systems  All systems reviewed and negative, other than as noted in HPI.  Physical Exam Updated Vital Signs BP (!) 109/54   Pulse 73   Temp 97.6 F (36.4 C)   Resp 14   SpO2 99%   Physical Exam Vitals signs and nursing note reviewed.  Constitutional:      General: He is not in acute distress.    Appearance: He is well-developed.  HENT:     Head: Normocephalic and atraumatic.  Eyes:     General:        Right eye: No discharge.        Left eye: No discharge.     Conjunctiva/sclera: Conjunctivae normal.  Neck:     Musculoskeletal: Neck supple.  Cardiovascular:     Rate and Rhythm: Normal rate and regular rhythm.     Heart sounds: Normal heart sounds. No murmur. No friction rub. No gallop.   Pulmonary:     Effort: Pulmonary effort is normal. No respiratory distress.     Breath sounds: Normal breath sounds.  Abdominal:     General: There is no distension.     Palpations: Abdomen is soft.     Tenderness: There is no abdominal tenderness.  Musculoskeletal:        General: No tenderness.     Right lower leg: Edema present.     Left lower leg: Edema present.     Comments: Symmetric pitting lower extremity edema extending up to the thighs/lower abdomen.  Skin:    General: Skin is warm and dry.  Neurological:     Mental Status: He is alert.  Psychiatric:         Behavior: Behavior normal.        Thought Content: Thought content normal.    ED Treatments / Results  Labs (all labs ordered are listed, but only abnormal results are displayed) Labs Reviewed  CBC WITH DIFFERENTIAL/PLATELET - Abnormal; Notable for the following components:      Result Value   RBC 3.77 (*)    Hemoglobin 12.4 (*)  HCT 37.5 (*)    All other components within normal limits  COMPREHENSIVE METABOLIC PANEL - Abnormal; Notable for the following components:   CO2 17 (*)    Glucose, Bld 109 (*)    BUN 80 (*)    Creatinine, Ser 3.68 (*)    Calcium 8.7 (*)    Total Protein 6.0 (*)    Albumin 2.6 (*)    AST 117 (*)    ALT 69 (*)    Alkaline Phosphatase 373 (*)    GFR calc non Af Amer 14 (*)    GFR calc Af Amer 17 (*)    All other components within normal limits  BRAIN NATRIURETIC PEPTIDE - Abnormal; Notable for the following components:   B Natriuretic Peptide 181.9 (*)    All other components within normal limits  PROTIME-INR - Abnormal; Notable for the following components:   Prothrombin Time 15.5 (*)    All other components within normal limits  TROPONIN I (HIGH SENSITIVITY) - Abnormal; Notable for the following components:   Troponin I (High Sensitivity) 55 (*)    All other components within normal limits    EKG EKG Interpretation  Date/Time:  Tuesday January 10 2019 21:02:15 EDT Ventricular Rate:  75 PR Interval:    QRS Duration: 145 QT Interval:  439 QTC Calculation: 491 R Axis:   -58 Text Interpretation:  Sinus rhythm Nonspecific IVCD with LAD Probable anterolateral infarct, age indeterm Confirmed by Virgel Manifold [phone]) on 01/10/2019 9:57:50 PM   Radiology No results found.  Procedures Procedures (including critical care time)  Medications Ordered in ED Medications  furosemide (LASIX) injection 40 mg (40 mg Intravenous Given 01/10/19 2149)    Initial Impression / Assessment and Plan / ED Course  I have reviewed the triage vital signs  and the nursing notes.  Pertinent labs & imaging results that were available during my care of the patient were reviewed by me and considered in my medical decision making (see chart for details).  85 year old male sent from nursing facility for evaluation of peripheral edema.  Initial given lasix. Subsequent work-up significant for impaired renal function that is a significant change from just 2 months ago.  No obviously nephrotoxic medications on his list.  hepatorenal syndrome?  LFTs are mildly abnormal.  Listed history of alcohol abuse.  Final Clinical Impressions(s) / ED Diagnoses   Final diagnoses:  Anasarca  AKI (acute kidney injury) [facility])    ED Discharge Orders    None       Virgel Manifold, MD 01/13/19 1008

## 2019-01-10 NOTE — ED Triage Notes (Signed)
Patient arrived with EMS from [place] home at [facility] , staff reported increasing edema at lower legs and abdomen this week , patient denies SOB/no chest pain . History of CHF /CAD.

## 2019-01-10 NOTE — H&P (Signed)
History and Physical    Daniel [name] HAL:[id number] DOB: [birth year] DOA: 01/10/2019  PCP: Jani Gravel, MD Patient coming from: [facility]  Chief Complaint: Edema  HPI: Daniel [name] is a 85 y.o. male with medical history significant of liver cirrhosis, alcohol dependence, CHF, CAD status post stent placement, type 2 diabetes, CKD stage III, hypertension, hyperlipidemia, peripheral neuropathy, seizures, alcohol abuse, prostate cancer status post radioactive seed implant presenting to the hospital via EMS from his nursing home for evaluation of bilateral lower extremity edema extending up to his abdomen.  Patient is not sure why he is here.  He has no complaints.  Denies shortness of breath, orthopnea, or lower extremity edema.  Denies chest pain.  States his appetite is good.  No additional history could be obtained from him.  ED Course: Bradycardic with heart rate ranging from the 40s to 60s.  Not hypotensive.  Not tachypneic or hypoxic.  No leukocytosis.  Bicarb 17, anion gap 12.  Bicarb mildly low on labs done a month ago as well.  BUN 80, creatinine 3.6.  Baseline creatinine 1.3.  LFTs elevated (AST 117, ALT 69, alk phos 373).  LFTs were elevated on labs done a month ago as well.  High-sensitivity troponin 55.  EKG without acute ischemic changes.  BNP 181.  SARS-CoV-2 test pending. Patient received IV Lasix 40 mg.  Review of Systems:  All systems reviewed and apart from history of presenting illness, are negative.  Past Medical History:  Diagnosis Date  . Alcohol abuse   . Cancer [facility])    prostate  . Cardiomyopathy ([place])   . CHF (congestive heart failure) ([place])   . Complication of anesthesia    Hx of seizure when put to sleep for surgery on 08/2009  . Constipation   . Coronary artery disease   . Diabetes mellitus without complication ([place])   . H/O hiatal hernia   . Hyperlipidemia   . Hypertension   . Peripheral neuropathy   . Seizures ([place])   . Urinary  incontinence     Past Surgical History:  Procedure Laterality Date  . CATARACT EXTRACTION, BILATERAL    . CORONARY ANGIOPLASTY     stents x 2  . CORONARY STENT PLACEMENT     2004  . PROSTATE SURGERY  2008   SEED IMPLANTS  . RADIOACTIVE SEED IMPLANT     prostate  . TONSILLECTOMY     1940's     reports that he has never smoked. He has never used smokeless tobacco. He reports current alcohol use. He reports that he does not use drugs.  No Known Allergies  Family History  Problem Relation Age of Onset  . Diabetes Mother   . Heart disease Father   . Heart disease Brother   . Diabetes Brother     Prior to Admission medications   Medication Sig Start Date End Date Taking? Authorizing Provider  ALPRAZolam (XANAX) 0.25 MG tablet Take 1 tablet (0.25 mg total) by mouth 2 (two) times daily as needed for anxiety. 10/14/18   Cristal Ford, DO  aspirin 81 MG tablet Take 81 mg by mouth daily.     [provider]  finasteride (PROSCAR) 5 MG tablet Take 5 mg by mouth daily.  01/09/13   [provider]  folic acid (FOLVITE) 1 MG tablet Take 1 tablet (1 mg total) by mouth daily. 10/15/18   Mikhail, Velta Addison, DO  insulin lispro (HUMALOG) 100 UNIT/ML KwikPen Inject 11 Units into the skin  2 (two) times a day. 06/02/18   [provider]  latanoprost (XALATAN) 0.005 % ophthalmic solution Place 1 drop into both eyes at bedtime.    [provider]  levETIRAcetam (KEPPRA) 500 MG tablet Take 1 tablet (500 mg total) by mouth 2 (two) times daily. 01/24/18   Black, Lezlie Octave, NP  magnesium oxide (MAG-OX) 400 MG tablet Take 400 mg by mouth daily.    [provider]  Multiple Vitamin (MULTIVITAMIN) tablet Take 1 tablet by mouth every evening.     [provider]  rosuvastatin (CRESTOR) 20 MG tablet Take 10 mg by mouth every evening.     [provider]  thiamine 100 MG tablet Take 1 tablet (100 mg total) by mouth daily. 10/15/18   Mikhail, Maryann, DO   TOUJEO SOLOSTAR 300 UNIT/ML SOPN Inject 18 Units into the skin at bedtime. 06/02/18   [provider]  vitamin C (ASCORBIC ACID) 500 MG tablet Take 500 mg by mouth every evening.     [provider]    Physical Exam: Vitals:   01/11/19 0100 01/11/19 0230 01/11/19 0400 01/11/19 0500  BP: 111/61 (!) 101/52 110/60 (!) 96/58  Pulse: 71 72 74 75  Resp: '13 14 15 16  ' Temp:      SpO2: 99% 98% 98% 98%    Physical Exam  Constitutional: He appears well-developed and well-nourished. No distress.  HENT:  Head: Normocephalic.  Eyes: Right eye exhibits no discharge. Left eye exhibits no discharge.  Neck: Neck supple.  Mild JVD  Cardiovascular: Normal rate, regular rhythm and intact distal pulses.  Pulmonary/Chest: Effort normal. No respiratory distress. He has no wheezes. He has no rales.  Abdominal: Soft. Bowel sounds are normal. He exhibits distension. There is no abdominal tenderness. There is no rebound and no guarding.  Musculoskeletal:        General: Edema present.     Comments: +3 pitting edema bilateral lower extremities extending up to the abdomen  Neurological:  Awake and alert  Skin: Skin is warm and dry. He is not diaphoretic.     Labs on Admission: I have personally reviewed following labs and imaging studies  CBC: Recent Labs  Lab 01/10/19 2132  WBC 7.9  NEUTROABS 4.5  HGB 12.4*  HCT 37.5*  MCV 99.5  PLT 500   Basic Metabolic Panel: Recent Labs  Lab 01/10/19 2132 01/11/19 0344  NA 135 136  K 4.4 4.0  CL 106 110  CO2 17* 15*  GLUCOSE 109* 129*  BUN 80* 80*  CREATININE 3.68* 3.63*  CALCIUM 8.7* 8.4*   GFR: CrCl cannot be calculated (Unknown ideal weight.). Liver Function Tests: Recent Labs  Lab 01/10/19 2132 01/11/19 0344  AST 117* 94*  ALT 69* 56*  ALKPHOS 373* 304*  BILITOT 1.2 1.0  PROT 6.0* 4.9*  ALBUMIN 2.6* 2.1*   No results for input(s): LIPASE, AMYLASE in the last 168 hours. No results for input(s): AMMONIA in the  last 168 hours. Coagulation Profile: Recent Labs  Lab 01/10/19 2132  INR 1.2   Cardiac Enzymes: No results for input(s): CKTOTAL, CKMB, CKMBINDEX, TROPONINI in the last 168 hours. BNP (last 3 results) No results for input(s): PROBNP in the last 8760 hours. HbA1C: No results for input(s): HGBA1C in the last 72 hours. CBG: Recent Labs  Lab 01/11/19 0114  GLUCAP 85   Lipid Profile: No results for input(s): CHOL, HDL, LDLCALC, TRIG, CHOLHDL, LDLDIRECT in the last 72 hours. Thyroid Function Tests: Recent Labs  01/11/19 0114  TSH 2.240  FREET4 1.37*   Anemia Panel: No results for input(s): VITAMINB12, FOLATE, FERRITIN, TIBC, IRON, RETICCTPCT in the last 72 hours. Urine analysis:    Component Value Date/Time   COLORURINE YELLOW 11/13/2018 1523   APPEARANCEUR CLEAR 11/13/2018 1523   LABSPEC 1.016 11/13/2018 1523   PHURINE 5.0 11/13/2018 1523   GLUCOSEU NEGATIVE 11/13/2018 1523   HGBUR NEGATIVE 11/13/2018 1523   [place] 11/13/2018 1523   [place] 11/13/2018 1523   PROTEINUR 30 (A) 11/13/2018 1523   UROBILINOGEN 1.0 04/17/2013 1014   NITRITE NEGATIVE 11/13/2018 1523   LEUKOCYTESUR NEGATIVE 11/13/2018 1523    Radiological Exams on Admission: Dg Chest 2 View  Result Date: 01/11/2019 CLINICAL DATA:  Lower extremity edema. Volume overload. EXAM: CHEST - 2 VIEW COMPARISON:  Radiograph 10/12/2018 FINDINGS: Low lung volumes limit assessment. Cardiomegaly. Left pleural effusion is moderate in size, associated basilar airspace disease likely atelectasis. No pulmonary edema. Right lung is clear. No pneumothorax. IMPRESSION: 1. Moderate left pleural effusion with associated basilar airspace disease, likely atelectasis. 2. Cardiomegaly without pulmonary edema. Electronically Signed   By: Keith Rake M.D.   On: 01/11/2019 01:49   US Renal  Result Date: 01/11/2019 CLINICAL DATA:  Initial evaluation for acute renal injury. EXAM: RENAL / URINARY TRACT  ULTRASOUND COMPLETE COMPARISON:  Prior ultrasound from 04/17/2013 FINDINGS: Right Kidney: Renal measurements: 11.1 x 4.8 x 5.4 cm = volume: 150.6 mL . Echogenicity within normal limits. No mass or hydronephrosis visualized. Left Kidney: Renal measurements: 11.2 x 5.3 x 5.6 cm = volume: 173.1 mL. Echogenicity within normal limits. No nephrolithiasis or hydronephrosis. 1.4 cm simple exophytic cyst present at the upper pole. Additional 2.5 cm simple cyst present within the mid-upper kidney. Bladder: Appears normal for degree of bladder distention. Other: Moderate volume ascites seen within the partially visualized abdomen. Liver appears cirrhotic with overall small size and nodular contour. IMPRESSION: 1. No hydronephrosis or other acute finding. 2. Hepatic cirrhosis with moderate volume ascites. 3. Few small simple left renal cysts as above. Electronically Signed   By: Jeannine Boga M.D.   On: 01/11/2019 02:10    EKG: Independently reviewed.  Sinus rhythm, low voltage.  No patient has no complaints  Assessment/Plan Principal Problem:   Edema Active Problems:   Alcohol dependence (HCC)   Type II diabetes mellitus with renal manifestations (HCC)   AKI (acute kidney injury) (HCC)   Bradycardia   Significant peripheral edema Patient has significant bilateral lower extremity edema extending up to the abdomen.  Does have a history of CHF and last echo done in July 2020 with LVEF 45 to 50% and left ventricular diastolic parameters consistent with pseudonormalization.  However, BNP not significantly elevated.  Chest x-ray showing moderate left pleural effusion but no overt pulmonary edema.  Patient has no complaints of dyspnea or orthopnea.  Not tachypneic or hypoxic.  Suspect his edema is related to decompensated liver cirrhosis.  Ultrasound with evidence of hepatic cirrhosis and moderate volume ascites.  AKI also likely contributing. -Received IV Lasix 40 mg in the ED.  Continue IV Lasix 40 mg twice  daily.  Continue monitor renal function closely given AKI. -Monitor intake and output, daily weights, low-sodium diet with fluid restriction -Consider IR consultation for paracentesis if no improvement in ascites with Lasix  AKI on CKD 3 BUN 80, creatinine 3.6.  Baseline creatinine 1.3.  AKI likely related to vascular congestion from volume overload.  Renal ultrasound without nephrolithiasis or hydronephrosis. -IV Lasix for  diuresis as above -Continue to monitor renal function closely -Monitor urine output -Avoid nephrotoxic agents/contrast -UA  Sinus bradycardia Bradycardic with heart rate ranging from the 40s to 60s.  Not hypotensive.  Asymptomatic.  Not on any AV nodal blocking agent. -Check TSH, free T4 levels -Cardiac monitoring  Normal anion gap metabolic acidosis Bicarb 17, anion gap 12.  Likely related to AKI. -Continue to monitor  Mildly elevated troponin High-sensitivity troponin 55 >49.  Troponin previously mildly elevated as well.  EKG without acute ischemic changes.  Patient has no complaints of chest pain and appears comfortable. -Cardiac monitoring  Insulin-dependent diabetes mellitus A1c 6.6 on 7/23. -Sliding scale insulin sensitive and CBG checks  History of alcohol dependence Patient is currently living in a nursing home.  No signs of withdrawal. -CIWA protocol; Ativan as needed -Thiamine, folate, multivitamin  DVT prophylaxis: Subcutaneous heparin Code Status: Unable to discuss with the patient at this time.  Patient is DNR per records from prior admissions.  Please verify with nursing home and family in the morning. Family Communication: No family available at this time. Disposition Plan: Anticipate discharge after clinical improvement. Consults called: None Admission status: It is my clinical opinion that admission to INPATIENT is reasonable and necessary in this 85 y.o. male . presenting with significant peripheral edema, decompensated cirrhosis, AKI.   Will need IV diuretic for several days.  Given the aforementioned, the predictability of an adverse outcome is felt to be significant. I expect that the patient will require at least 2 midnights in the hospital to treat this condition.   The medical decision making on this patient was of high complexity and the patient is at high risk for clinical deterioration, therefore this is a level 3 visit.  Shela Leff MD [facility] Pager [phone]  If 7PM-7AM, please contact night-coverage [email or web address] Password [email or web address]  01/11/2019, 5:42 AM

## 2019-01-11 ENCOUNTER — Inpatient Hospital Stay (HOSPITAL_COMMUNITY): Payer: Medicare Other

## 2019-01-11 DIAGNOSIS — N179 Acute kidney failure, unspecified: Secondary | ICD-10-CM

## 2019-01-11 DIAGNOSIS — R001 Bradycardia, unspecified: Secondary | ICD-10-CM

## 2019-01-11 DIAGNOSIS — R609 Edema, unspecified: Secondary | ICD-10-CM | POA: Diagnosis not present

## 2019-01-11 LAB — GLUCOSE, CAPILLARY
Glucose-Capillary: 101 mg/dL — ABNORMAL HIGH (ref 70–99)
Glucose-Capillary: 123 mg/dL — ABNORMAL HIGH (ref 70–99)
Glucose-Capillary: 102 mg/dL — ABNORMAL HIGH (ref 70–99)

## 2019-01-11 LAB — BASIC METABOLIC PANEL
Anion gap: 9 (ref 5–15)
Anion gap: 12 (ref 5–15)
BUN: 81 mg/dL — ABNORMAL HIGH (ref 8–23)
BUN: 79 mg/dL — ABNORMAL HIGH (ref 8–23)
CO2: 18 mmol/L — ABNORMAL LOW (ref 22–32)
CO2: 17 mmol/L — ABNORMAL LOW (ref 22–32)
Calcium: 8.5 mg/dL — ABNORMAL LOW (ref 8.9–10.3)
Calcium: 8.5 mg/dL — ABNORMAL LOW (ref 8.9–10.3)
Chloride: 108 mmol/L (ref 98–111)
Chloride: 106 mmol/L (ref 98–111)
Creatinine, Ser: 3.58 mg/dL — ABNORMAL HIGH (ref 0.61–1.24)
Creatinine, Ser: 3.79 mg/dL — ABNORMAL HIGH (ref 0.61–1.24)
GFR calc Af Amer: 17 mL/min — ABNORMAL LOW (ref >60)
GFR calc Af Amer: 16 mL/min — ABNORMAL LOW (ref >60)
GFR calc non Af Amer: 15 mL/min — ABNORMAL LOW (ref >60)
GFR calc non Af Amer: 14 mL/min — ABNORMAL LOW (ref >60)
Glucose, Bld: 102 mg/dL — ABNORMAL HIGH (ref 70–99)
Glucose, Bld: 129 mg/dL — ABNORMAL HIGH (ref 70–99)
Potassium: 4 mmol/L (ref 3.5–5.1)
Potassium: 4.1 mmol/L (ref 3.5–5.1)
Sodium: 135 mmol/L (ref 135–145)
Sodium: 135 mmol/L (ref 135–145)

## 2019-01-11 LAB — COMPREHENSIVE METABOLIC PANEL
ALT: 56 U/L — ABNORMAL HIGH (ref 0–44)
AST: 94 U/L — ABNORMAL HIGH (ref 15–41)
Albumin: 2.1 g/dL — ABNORMAL LOW (ref 3.5–5.0)
Alkaline Phosphatase: 304 U/L — ABNORMAL HIGH (ref 38–126)
Anion gap: 11 (ref 5–15)
BUN: 80 mg/dL — ABNORMAL HIGH (ref 8–23)
CO2: 15 mmol/L — ABNORMAL LOW (ref 22–32)
Calcium: 8.4 mg/dL — ABNORMAL LOW (ref 8.9–10.3)
Chloride: 110 mmol/L (ref 98–111)
Creatinine, Ser: 3.63 mg/dL — ABNORMAL HIGH (ref 0.61–1.24)
GFR calc Af Amer: 17 mL/min — ABNORMAL LOW (ref >60)
GFR calc non Af Amer: 15 mL/min — ABNORMAL LOW (ref >60)
Glucose, Bld: 129 mg/dL — ABNORMAL HIGH (ref 70–99)
Potassium: 4 mmol/L (ref 3.5–5.1)
Sodium: 136 mmol/L (ref 135–145)
Total Bilirubin: 1 mg/dL (ref 0.3–1.2)
Total Protein: 4.9 g/dL — ABNORMAL LOW (ref 6.5–8.1)

## 2019-01-11 LAB — URINALYSIS, ROUTINE W REFLEX MICROSCOPIC
Bilirubin Urine: NEGATIVE
Glucose, UA: NEGATIVE mg/dL
Hgb urine dipstick: NEGATIVE
Ketones, ur: NEGATIVE mg/dL
Leukocytes,Ua: NEGATIVE
Nitrite: NEGATIVE
Protein, ur: NEGATIVE mg/dL
Specific Gravity, Urine: 1.01 (ref 1.005–1.030)
pH: 5 (ref 5.0–8.0)

## 2019-01-11 LAB — TROPONIN I (HIGH SENSITIVITY): Troponin I (High Sensitivity): 49 ng/L — ABNORMAL HIGH (ref <18)

## 2019-01-11 LAB — TSH: TSH: 2.24 u[IU]/mL (ref 0.350–4.500)

## 2019-01-11 LAB — SODIUM, URINE, RANDOM: Sodium, Ur: 37 mmol/L

## 2019-01-11 LAB — T4, FREE: Free T4: 1.37 ng/dL — ABNORMAL HIGH (ref 0.61–1.12)

## 2019-01-11 LAB — CBG MONITORING, ED
Glucose-Capillary: 85 mg/dL (ref 70–99)
Glucose-Capillary: 131 mg/dL — ABNORMAL HIGH (ref 70–99)

## 2019-01-11 LAB — SARS CORONAVIRUS 2 (TAT 6-24 HRS): SARS Coronavirus 2: NEGATIVE

## 2019-01-11 LAB — CREATININE, URINE, RANDOM: Creatinine, Urine: 111.38 mg/dL

## 2019-01-11 MED ORDER — VITAMIN B-1 100 MG PO TABS
100.0000 mg | ORAL_TABLET | Freq: Every day | ORAL | Status: DC
  Administered 2019-01-11 – 2019-01-19 (×9): 100 mg via ORAL
  Filled 2019-01-11 (×10): qty 1

## 2019-01-11 MED ORDER — ADULT MULTIVITAMIN W/MINERALS CH
1.0000 | ORAL_TABLET | Freq: Every day | ORAL | Status: DC
  Administered 2019-01-11 – 2019-01-19 (×9): 1 via ORAL
  Filled 2019-01-11 (×11): qty 1

## 2019-01-11 MED ORDER — ACETAMINOPHEN 325 MG PO TABS: 650.0000 mg | ORAL_TABLET | Freq: Four times a day (QID) | ORAL | Status: DC | PRN

## 2019-01-11 MED ORDER — FUROSEMIDE 10 MG/ML IJ SOLN
40.0000 mg | Freq: Two times a day (BID) | INTRAMUSCULAR | Status: DC
  Administered 2019-01-11: 08:00:00 40 mg via INTRAVENOUS
  Filled 2019-01-11 (×2): qty 4

## 2019-01-11 MED ORDER — ACETAMINOPHEN 650 MG RE SUPP: 650.0000 mg | Freq: Four times a day (QID) | RECTAL | Status: DC | PRN

## 2019-01-11 MED ORDER — THIAMINE HCL 100 MG/ML IJ SOLN: 100.0000 mg | Freq: Every day | INTRAMUSCULAR | Status: DC

## 2019-01-11 MED ORDER — MIDODRINE HCL 5 MG PO TABS
10.0000 mg | ORAL_TABLET | Freq: Three times a day (TID) | ORAL | Status: DC
  Administered 2019-01-11 – 2019-01-19 (×22): 10 mg via ORAL
  Filled 2019-01-11 (×23): qty 2

## 2019-01-11 MED ORDER — FUROSEMIDE 10 MG/ML IJ SOLN
40.0000 mg | Freq: Two times a day (BID) | INTRAMUSCULAR | Status: DC
  Administered 2019-01-11: 40 mg via INTRAVENOUS
  Filled 2019-01-11: qty 4

## 2019-01-11 MED ORDER — LORAZEPAM 2 MG/ML IJ SOLN: 1.0000 mg | INTRAMUSCULAR | Status: AC | PRN

## 2019-01-11 MED ORDER — ALBUMIN HUMAN 25 % IV SOLN
25.0000 g | Freq: Once | INTRAVENOUS | Status: AC
  Administered 2019-01-11: 13:00:00 25 g via INTRAVENOUS
  Filled 2019-01-11: qty 100

## 2019-01-11 MED ORDER — FOLIC ACID 1 MG PO TABS
1.0000 mg | ORAL_TABLET | Freq: Every day | ORAL | Status: DC
  Administered 2019-01-11 – 2019-01-19 (×9): 1 mg via ORAL
  Filled 2019-01-11 (×10): qty 1

## 2019-01-11 MED ORDER — LORAZEPAM 1 MG PO TABS: 1.0000 mg | ORAL_TABLET | ORAL | Status: AC | PRN

## 2019-01-11 MED ORDER — FUROSEMIDE 10 MG/ML IJ SOLN
40.0000 mg | Freq: Four times a day (QID) | INTRAMUSCULAR | Status: DC
  Administered 2019-01-11 – 2019-01-13 (×8): 40 mg via INTRAVENOUS
  Filled 2019-01-11 (×8): qty 4

## 2019-01-11 MED ORDER — INSULIN ASPART 100 UNIT/ML ~~LOC~~ SOLN: 0.0000 [IU] | Freq: Every day | SUBCUTANEOUS | Status: DC

## 2019-01-11 MED ORDER — HEPARIN SODIUM (PORCINE) 5000 UNIT/ML IJ SOLN
5000.0000 [IU] | Freq: Three times a day (TID) | INTRAMUSCULAR | Status: DC
  Administered 2019-01-11 – 2019-01-19 (×24): 5000 [IU] via SUBCUTANEOUS
  Filled 2019-01-11 (×24): qty 1

## 2019-01-11 MED ORDER — INSULIN ASPART 100 UNIT/ML ~~LOC~~ SOLN
0.0000 [IU] | Freq: Three times a day (TID) | SUBCUTANEOUS | Status: DC
  Administered 2019-01-11 – 2019-01-17 (×11): 1 [IU] via SUBCUTANEOUS
  Administered 2019-01-17: 2 [IU] via SUBCUTANEOUS
  Administered 2019-01-18 (×2): 1 [IU] via SUBCUTANEOUS
  Administered 2019-01-18: 2 [IU] via SUBCUTANEOUS
  Administered 2019-01-19 (×2): 1 [IU] via SUBCUTANEOUS

## 2019-01-11 NOTE — Consult Note (Signed)
[facility] Renal Consultation Note  Requesting MD: Florene Glen  Indication for Consultation: A on CRF  HPI:  Daniel [name] is a 85 y.o. male nursing home resident with past medical history significant for HTN, DM, CAD with EF of 40-45 %, ETOH abuse with cirrhosis, sz d/o as well as prostate CA s/p radioactive seeds. He also appears to have CKD- with crt 1.3 to 1.7- was last noted to be 1.38 in August of this year.  He is brought to the ER from his nursing home with complaints of swelling.  On admission, he was found to be bradycardic and BP was soft.  He was on an ARB as an OP - cozaar 25 as well as lasix 20 mg daily.  Presenting creatinine was 3.68-  Was 3.58 today -  BP has been as low as 89 systolic but HR is better.  UOP only recorded at 300 ml total.  Renal ultrasound obtained, no hydro and fairly normal sized kidneys-  Urinalysis is negative for blood or protein.  Patient is unable to provide any history.  He is not oriented  Creatinine, Ser  Date/Time Value Ref Range Status  01/11/2019 10:46 AM 3.58 (H) 0.61 - 1.24 mg/dL Final  01/11/2019 03:44 AM 3.63 (H) 0.61 - 1.24 mg/dL Final  01/10/2019 09:32 PM 3.68 (H) 0.61 - 1.24 mg/dL Final  11/13/2018 01:50 PM 1.38 (H) 0.61 - 1.24 mg/dL Final  10/13/2018 02:51 AM 1.37 (H) 0.61 - 1.24 mg/dL Final  10/12/2018 02:22 AM 1.33 (H) 0.61 - 1.24 mg/dL Final  10/11/2018 06:10 PM 1.41 (H) 0.61 - 1.24 mg/dL Final  01/24/2018 06:04 AM 1.26 (H) 0.61 - 1.24 mg/dL Final  01/23/2018 02:30 PM 1.40 (H) 0.61 - 1.24 mg/dL Final  01/23/2018 02:23 PM 1.66 (H) 0.61 - 1.24 mg/dL Final  07/20/2017 10:40 AM 1.08 0.61 - 1.24 mg/dL Final  07/19/2017 08:24 AM 1.31 (H) 0.61 - 1.24 mg/dL Final  07/19/2017 01:42 AM 1.70 (H) 0.61 - 1.24 mg/dL Final  06/18/2017 07:59 AM 1.59 (H) 0.61 - 1.24 mg/dL Final  06/17/2017 03:59 AM 1.51 (H) 0.61 - 1.24 mg/dL Final  06/16/2017 12:57 PM 1.40 (H) 0.61 - 1.24 mg/dL Final  06/16/2017 12:51 PM 1.48 (H) 0.61 - 1.24 mg/dL  Final  05/08/2017 02:30 PM 1.40 (H) 0.61 - 1.24 mg/dL Final  04/20/2013 04:13 AM 1.20 0.50 - 1.35 mg/dL Final  04/18/2013 03:53 AM 1.49 (H) 0.50 - 1.35 mg/dL Final  04/17/2013 09:50 AM 1.83 (H) 0.50 - 1.35 mg/dL Final  09/02/2009 09:17 AM 1.11 0.4 - 1.5 mg/dL Final  08/28/2009 03:49 AM 1.09 0.4 - 1.5 mg/dL Final  08/27/2009 04:00 AM 1.10 0.4 - 1.5 mg/dL Final  08/26/2009 10:04 PM 1.14 0.4 - 1.5 mg/dL Final  08/26/2009 11:12 AM 1.28 0.4 - 1.5 mg/dL Final  08/31/2007 02:01 PM 1.2  Final     PMHx:   Past Medical History:  Diagnosis Date  . Alcohol abuse   . Cancer [facility])    prostate  . Cardiomyopathy ([place])   . CHF (congestive heart failure) ([place])   . Complication of anesthesia    Hx of seizure when put to sleep for surgery on 08/2009  . Constipation   . Coronary artery disease   . Diabetes mellitus without complication ([place])   . H/O hiatal hernia   . Hyperlipidemia   . Hypertension   . Peripheral neuropathy   . Seizures ([place])   . Urinary incontinence     Past Surgical History:  Procedure  Laterality Date  . CATARACT EXTRACTION, BILATERAL    . CORONARY ANGIOPLASTY     stents x 2  . CORONARY STENT PLACEMENT     2004  . PROSTATE SURGERY  2008   SEED IMPLANTS  . RADIOACTIVE SEED IMPLANT     prostate  . TONSILLECTOMY     1940's    Family Hx:  Family History  Problem Relation Age of Onset  . Diabetes Mother   . Heart disease Father   . Heart disease Brother   . Diabetes Brother     Social History:  reports that he has never smoked. He has never used smokeless tobacco. He reports current alcohol use. He reports that he does not use drugs.  Allergies: No Known Allergies  Medications: Prior to Admission medications   Medication Sig Start Date End Date Taking? Authorizing Provider  ALPRAZolam (XANAX) 0.25 MG tablet Take 1 tablet (0.25 mg total) by mouth 2 (two) times daily as needed for anxiety. Patient taking differently: Take 0.25 mg by mouth at bedtime.  10/14/18   Yes Mikhail, Velta Addison, DO  finasteride (PROSCAR) 5 MG tablet Take 5 mg by mouth daily.  01/09/13  Yes [provider]  furosemide (LASIX) 20 MG tablet Take 20 mg by mouth daily.   Yes [provider]  latanoprost (XALATAN) 0.005 % ophthalmic solution Place 1 drop into both eyes at bedtime.   Yes [provider]  levETIRAcetam (KEPPRA) 500 MG tablet Take 1 tablet (500 mg total) by mouth 2 (two) times daily. 01/24/18  Yes Black, Lezlie Octave, NP  losartan (COZAAR) 25 MG tablet Take 25 mg by mouth daily.   Yes [provider]  Magnesium 250 MG TABS Take 250 mg by mouth daily.   Yes [provider]  Petrolatum (SECURA PROTECTIVE) OINT Apply topically 2 (two) times daily. To bilateral lower extremities   Yes [provider]  rosuvastatin (CRESTOR) 20 MG tablet Take 10 mg by mouth daily.    Yes [provider]  senna (SENOKOT) 8.6 MG TABS tablet Take 2 tablets by mouth at bedtime.   Yes [provider]  sertraline (ZOLOFT) 50 MG tablet Take 50 mg by mouth daily.   Yes [provider]  Skin Protectants, Misc. (BAZA PROTECT EX) Apply topically 2 (two) times daily. To buttocks   Yes [provider]  TOUJEO SOLOSTAR 300 UNIT/ML SOPN Inject 15 Units into the skin at bedtime.  06/02/18  Yes [provider]  vitamin C (ASCORBIC ACID) 500 MG tablet Take 500 mg by mouth daily.    Yes [provider]  folic acid (FOLVITE) 1 MG tablet Take 1 tablet (1 mg total) by mouth daily. Patient not taking: Reported on 01/11/2019 10/15/18   Cristal Ford, DO  thiamine 100 MG tablet Take 1 tablet (100 mg total) by mouth daily. Patient not taking: Reported on 01/11/2019 10/15/18   Cristal Ford, DO    I have reviewed the patient's current medications.  Labs:  Results for orders placed or performed during the hospital encounter of 01/10/19 (from the past 48 hour(s))  CBC with Differential     Status: Abnormal    Collection Time: 01/10/19  9:32 PM  Result Value Ref Range   WBC 7.9 4.0 - 10.5 K/uL   RBC 3.77 (L) 4.22 - 5.81 MIL/uL   Hemoglobin 12.4 (L) 13.0 - 17.0 g/dL   HCT 37.5 (L) 39.0 - 52.0 %   MCV 99.5 80.0 - 100.0 fL   MCH  32.9 26.0 - 34.0 pg   MCHC 33.1 30.0 - 36.0 g/dL   RDW 14.1 11.5 - 15.5 %   Platelets 205 150 - 400 K/uL   nRBC 0.0 0.0 - 0.2 %   Neutrophils Relative % 57 %   Neutro Abs 4.5 1.7 - 7.7 K/uL   Lymphocytes Relative 26 %   Lymphs Abs 2.0 0.7 - 4.0 K/uL   Monocytes Relative 11 %   Monocytes Absolute 0.9 0.1 - 1.0 K/uL   Eosinophils Relative 6 %   Eosinophils Absolute 0.5 0.0 - 0.5 K/uL   Basophils Relative 0 %   Basophils Absolute 0.0 0.0 - 0.1 K/uL   Immature Granulocytes 0 %   Abs Immature Granulocytes 0.02 0.00 - 0.07 K/uL    Comment: Performed at [facility] [address] [place], [place] [address]  Comprehensive metabolic panel     Status: Abnormal   Collection Time: 01/10/19  9:32 PM  Result Value Ref Range   Sodium 135 135 - 145 mmol/L   Potassium 4.4 3.5 - 5.1 mmol/L   Chloride 106 98 - 111 mmol/L   CO2 17 (L) 22 - 32 mmol/L   Glucose, Bld 109 (H) 70 - 99 mg/dL   BUN 80 (H) 8 - 23 mg/dL   Creatinine, Ser 3.68 (H) 0.61 - 1.24 mg/dL   Calcium 8.7 (L) 8.9 - 10.3 mg/dL   Total Protein 6.0 (L) 6.5 - 8.1 g/dL   Albumin 2.6 (L) 3.5 - 5.0 g/dL   AST 117 (H) 15 - 41 U/L   ALT 69 (H) 0 - 44 U/L   Alkaline Phosphatase 373 (H) 38 - 126 U/L   Total Bilirubin 1.2 0.3 - 1.2 mg/dL   GFR calc non Af Amer 14 (L) >60 mL/min   GFR calc Af Amer 17 (L) >60 mL/min   Anion gap 12 5 - 15    Comment: Performed at [facility], [place] [address] [place], [place] [address]  Troponin I (High Sensitivity)     Status: Abnormal   Collection Time: 01/10/19  9:32 PM  Result Value Ref Range   Troponin I (High Sensitivity) 55 (H) <18 ng/L    Comment: (NOTE) Elevated high sensitivity troponin I (hsTnI) values and significant  changes across serial measurements may  suggest ACS but many other  chronic and acute conditions are known to elevate hsTnI results.  Refer to the "Links" section for chest pain algorithms and additional  guidance. Performed at [facility], [place] [address] [place], [place] [address]   Brain natriuretic peptide     Status: Abnormal   Collection Time: 01/10/19  9:32 PM  Result Value Ref Range   B Natriuretic Peptide 181.9 (H) 0.0 - 100.0 pg/mL    Comment: Performed at [facility] [address] [place], [place] [address]  Protime-INR     Status: Abnormal   Collection Time: 01/10/19  9:32 PM  Result Value Ref Range   Prothrombin Time 15.5 (H) 11.4 - 15.2 seconds   INR 1.2 0.8 - 1.2    Comment: (NOTE) INR goal varies based on device and disease states. Performed at [facility], [place] [address], [place], [place] [address]   SARS CORONAVIRUS 2 (TAT 6-24 HRS) Nasopharyngeal Nasopharyngeal Swab     Status: None   Collection Time: 01/10/19 11:35 PM   Specimen: Nasopharyngeal Swab  Result Value Ref Range   SARS Coronavirus 2 NEGATIVE NEGATIVE  Comment: (NOTE) SARS-CoV-2 target nucleic acids are NOT DETECTED. The SARS-CoV-2 RNA is generally detectable in upper and lower respiratory specimens during the acute phase of infection. Negative results do not preclude SARS-CoV-2 infection, do not rule out co-infections with other pathogens, and should not be used as the sole basis for treatment or other patient management decisions. Negative results must be combined with clinical observations, patient history, and epidemiological information. The expected result is Negative. Fact Sheet for Patients: SugarRoll.be Fact Sheet for Healthcare Providers: https://www.woods-mathews.com/ This test is not yet approved or cleared by the [place] FDA and  has been authorized for detection and/or diagnosis of SARS-CoV-2 by FDA under an Emergency Use Authorization (EUA). This  EUA will remain  in effect (meaning this test can be used) for the duration of the COVID-19 declaration under Section 56 4(b)(1) of the Act, 21 U.S.C. section 360bbb-3(b)(1), unless the authorization is terminated or revoked sooner. Performed at [facility], [place] [address] [place], [place] [address]   TSH     Status: None   Collection Time: 01/11/19  1:14 AM  Result Value Ref Range   TSH 2.240 0.350 - 4.500 uIU/mL    Comment: Performed by a 3rd Generation assay with a functional sensitivity of <=0.01 uIU/mL. Performed at [facility], [place] [address] [place], [place] [address]   T4, free     Status: Abnormal   Collection Time: 01/11/19  1:14 AM  Result Value Ref Range   Free T4 1.37 (H) 0.61 - 1.12 ng/dL    Comment: (NOTE) Biotin ingestion may interfere with free T4 tests. If the results are inconsistent with the TSH level, previous test results, or the clinical presentation, then consider biotin interference. If needed, order repeat testing after stopping biotin. Performed at [facility], [place] [address], [place], [place] [address]   Troponin I (High Sensitivity)     Status: Abnormal   Collection Time: 01/11/19  1:14 AM  Result Value Ref Range   Troponin I (High Sensitivity) 49 (H) <18 ng/L    Comment: (NOTE) Elevated high sensitivity troponin I (hsTnI) values and significant  changes across serial measurements may suggest ACS but many other  chronic and acute conditions are known to elevate hsTnI results.  Refer to the "Links" section for chest pain algorithms and additional  guidance. Performed at [facility], [place] [address] [place], [place] [address]   CBG monitoring, ED     Status: None   Collection Time: 01/11/19  1:14 AM  Result Value Ref Range   Glucose-Capillary 85 70 - 99 mg/dL  Comprehensive metabolic panel     Status: Abnormal   Collection Time: 01/11/19  3:44 AM  Result Value Ref Range   Sodium 136 135 - 145 mmol/L   Potassium  4.0 3.5 - 5.1 mmol/L   Chloride 110 98 - 111 mmol/L   CO2 15 (L) 22 - 32 mmol/L   Glucose, Bld 129 (H) 70 - 99 mg/dL   BUN 80 (H) 8 - 23 mg/dL   Creatinine, Ser 3.63 (H) 0.61 - 1.24 mg/dL   Calcium 8.4 (L) 8.9 - 10.3 mg/dL   Total Protein 4.9 (L) 6.5 - 8.1 g/dL   Albumin 2.1 (L) 3.5 - 5.0 g/dL   AST 94 (H) 15 - 41 U/L   ALT 56 (H) 0 - 44 U/L   Alkaline Phosphatase 304 (H) 38 - 126 U/L   Total Bilirubin 1.0 0.3 - 1.2 mg/dL  GFR calc non Af Amer 15 (L) >60 mL/min   GFR calc Af Amer 17 (L) >60 mL/min   Anion gap 11 5 - 15    Comment: Performed at [facility] [address] [place], [place] [address]  Urinalysis, Routine w reflex microscopic     Status: None   Collection Time: 01/11/19  5:38 AM  Result Value Ref Range   Color, Urine YELLOW YELLOW   APPearance CLEAR CLEAR   Specific Gravity, Urine 1.010 1.005 - 1.030   pH 5.0 5.0 - 8.0   Glucose, UA NEGATIVE NEGATIVE mg/dL   Hgb urine dipstick NEGATIVE NEGATIVE   Bilirubin Urine NEGATIVE NEGATIVE   Ketones, ur NEGATIVE NEGATIVE mg/dL   Protein, ur NEGATIVE NEGATIVE mg/dL   Nitrite NEGATIVE NEGATIVE   Leukocytes,Ua NEGATIVE NEGATIVE    Comment: Performed at [facility] [address], [place], [place] [address]  CBG monitoring, ED     Status: Abnormal   Collection Time: 01/11/19  7:51 AM  Result Value Ref Range   Glucose-Capillary 131 (H) 70 - 99 mg/dL  Basic metabolic panel     Status: Abnormal   Collection Time: 01/11/19 10:46 AM  Result Value Ref Range   Sodium 135 135 - 145 mmol/L   Potassium 4.0 3.5 - 5.1 mmol/L   Chloride 108 98 - 111 mmol/L   CO2 18 (L) 22 - 32 mmol/L   Glucose, Bld 102 (H) 70 - 99 mg/dL   BUN 81 (H) 8 - 23 mg/dL   Creatinine, Ser 3.58 (H) 0.61 - 1.24 mg/dL   Calcium 8.5 (L) 8.9 - 10.3 mg/dL   GFR calc non Af Amer 15 (L) >60 mL/min   GFR calc Af Amer 17 (L) >60 mL/min   Anion gap 9 5 - 15    Comment: Performed at [facility] [address], [place], [place] [address]   Glucose, capillary     Status: Abnormal   Collection Time: 01/11/19 11:57 AM  Result Value Ref Range   Glucose-Capillary 101 (H) 70 - 99 mg/dL   Comment 1 Notify RN    Comment 2 Document in Chart      ROS:  Review of systems not obtained due to patient factors.  Physical Exam: Vitals:   01/11/19 0932 01/11/19 1151  BP: (!) 103/58 (!) 101/58  Pulse: 73 71  Resp: 18 18  Temp: 97.7 F (36.5 C)   SpO2: 98% 99%     General: Pleasant but confused white male.  Did not know year and had president was Reagan HEENT: Pupils are equally round and reactive to light, extraocular motions are intact Neck: Positive for JVD Heart: Regular rate and rhythm Lungs: Decreased breath sounds at the bases Abdomen: Distended, presumed ascites, abdominal wall edema, nontender Extremities: Pitting edema throughout Skin: Warm and dry Neuro: Alert.  Not oriented-do not know baseline  Assessment/Plan: 85 year old white male with multiple medical issues including some stage III CKD.  He presents with volume overload and acute on chronic renal failure 1.Renal-acute on chronic renal failure in the setting of cirrhosis, decreased ejection fraction, and volume overload.  He was on Cozaar as an outpatient and blood pressure is soft.  That along with a fairly normal renal ultrasound and a bland urinalysis leads me to believe that this acute kidney injury has been hemodynamically mediated.  Either by cardiorenal syndrome, hepatorenal syndrome or just hypotension on an ARB.  ARB has been held.  He is no  longer on any antihypertensives.  Urine sodium is being checked to look specifically at hepatorenal.  What I will try is to place him on midodrine and to place him on low-dose around-the-clock IV Lasix to try to achieve some diuresis.  His albumin is low so he is third spacing but he does not have nephrotic syndrome.  As an aside, I do not feel that he is a candidate for dialysis therapy given his chronic debilitated  state, age and presumed dementia 2. Hypertension/volume  -volume overloaded with a soft blood pressure.  All antihypertensives have been stopped.  Will attempt midodrine and IV Lasix for diuresis 3.  Electrolytes-potassium, calcium are within normal limits at this time 4.  Metabolic acidosis-in the setting of acute kidney injury.  Will not give sodium bicarb as he is already volume overloaded 4. Anemia  -is not significant at this time nor does it require treatment   Louis Meckel 01/11/2019, 2:36 PM

## 2019-01-11 NOTE — Progress Notes (Signed)
Patient admission history has not been done because patient is only oriented to self.

## 2019-01-11 NOTE — ED Notes (Signed)
Admitting MD notified on elevated Troponin result.

## 2019-01-11 NOTE — Progress Notes (Signed)
PROGRESS NOTE    Daniel [name]  FXO:[id number] DOB: [birth year] DOA: 01/10/2019 PCP: Jani Gravel, MD   Brief Narrative:  Daniel [name] is Daniel [name] 85 y.o. male with medical history significant of liver cirrhosis, alcohol dependence, CHF, CAD status post stent placement, type 2 diabetes, CKD stage III, hypertension, hyperlipidemia, peripheral neuropathy, seizures, alcohol abuse, prostate cancer status post radioactive seed implant presenting to the hospital via EMS from his nursing home for evaluation of bilateral lower extremity edema extending up to his abdomen.  Patient is not sure why he is here.  He has no complaints.  Denies shortness of breath, orthopnea, or lower extremity edema.  Denies chest pain.  States his appetite is good.  No additional history could be obtained from him.  ED Course: Bradycardic with heart rate ranging from the 40s to 60s.  Not hypotensive.  Not tachypneic or hypoxic.  No leukocytosis.  Bicarb 17, anion gap 12.  Bicarb mildly low on labs done Daniel [name] month ago as well.  BUN 80, creatinine 3.6.  Baseline creatinine 1.3.  LFTs elevated (AST 117, ALT 69, alk phos 373).  LFTs were elevated on labs done Daniel [name] month ago as well.  High-sensitivity troponin 55.  EKG without acute ischemic changes.  BNP 181.  SARS-CoV-2 test pending. Patient received IV Lasix 40 mg.  Assessment & Plan:   Principal Problem:   Edema Active Problems:   Alcohol dependence (HCC)   Type II diabetes mellitus with renal manifestations (HCC)   AKI (acute kidney injury) (HCC)   Bradycardia   Anasarca  Volume Overload  Cirrhosis: Patient has significant bilateral lower extremity edema extending up to the abdomen.  Does have [name] history of CHF and last echo done in July 2020 with LVEF 45 to 50% and left ventricular diastolic parameters consistent with pseudonormalization.  However, BNP not significantly elevated.  Chest x-ray showing moderate left pleural effusion but no overt pulmonary edema.  Patient has no  complaints of dyspnea or orthopnea.  Not tachypneic or hypoxic.  Suspect his edema is related to decompensated liver cirrhosis.  Albumin is low at 2.1. Ultrasound with evidence of hepatic cirrhosis and moderate volume ascites.  -Received IV Lasix 40 mg in the ED.   -Lasix 40 BID x2 ordered for 10/21, give albumin with lasix this afternoon -unable to start spironolactone right now due to AKI  -consider therapeutic paracentesis +/- thoracentesis as well pending course -Monitor intake and output, daily weights, low-sodium diet with fluid restriction -Consider IR consultation for paracentesis if no improvement in ascites with Lasix  AKI on CKD 3 BUN 80, creatinine 3.6.  Baseline creatinine 1.3.  AKI likely related to volume overload, intravascularly dry with hypoalbuminemia, possibly related to diuresis as well (apparently per son, he was being managed at nursing facility for volume overload and may have been getting higher doses of lasix?).  Hepatorenal should be on differential as well with his cirrhosis.  -creatinine slightly improved today -IV Lasix for diuresis as above, give albumin with this this PM -Continue to monitor renal functiontsh closely - will check BMP again this afternoon to see how she's done with IV lasix - Will consult renal today as well - urine is bland, follow urine lytes - I/O, daily weights - Monitor urine output - Avoid nephrotoxic agents/contrast  Sinus bradycardia Bradycardic with heart rate ranging from the 40s to 60s.  Not hypotensive.  Asymptomatic.  Not on any AV nodal blocking agent. -Check TSH, free T4 levels (T4 elevated) - mildly  abnormal - follow repeat outpatient  -Cardiac monitoring  Normal anion gap metabolic acidosis Likely related to AKI. -Continue to monitor  Mildly elevated troponin High-sensitivity troponin 55 >49.  Troponin previously mildly elevated as well.  EKG without acute ischemic changes.  Patient has no complaints of chest pain and  appears comfortable. -Cardiac monitoring  Insulin-dependent diabetes mellitus A1c 6.6 on 7/23. -Sliding scale insulin sensitive and CBG checks - BG's reasonable   History of alcohol dependence Patient is currently living [name].  No signs of withdrawal. -CIWA protocol; Ativan as needed -Thiamine, folate, multivitamin  DVT prophylaxis: heparin  Code Status: DNR Family Communication: son at bedside Disposition Plan: pending further improvement  Consultants:   none  Procedures:   none  Antimicrobials:  Anti-infectives (From admission, onward)   None     Subjective: Son at bedside, pt unable to say much about why he's here No specific complaints  Objective: Vitals:   01/11/19 0800 01/11/19 0911 01/11/19 0932 01/11/19 1151  BP: (!) 117/55 (!) 106/50 (!) 103/58 (!) 101/58  Pulse: 76 74 73 71  Resp: '13 19 18   ' Temp:   97.7 F (36.5 C)   TempSrc:   Oral   SpO2: 99% 99% 98% 99%    Intake/Output Summary (Last 24 hours) at 01/11/2019 1328 Last data filed at 01/11/2019 0915 Gross per 24 hour  Intake -  Output 300 ml  Net -300 ml   There were no vitals filed for this visit.  Examination:  General exam: Appears calm and comfortable  Respiratory system: Clear to auscultation. Respiratory effort normal. Cardiovascular system: S1 & S2 heard, RRR.  Gastrointestinal system: Abdomen is protuberant, soft and nontender.  Central nervous system: Alert. No focal neurological deficits. Extremities: edema to abdomen bilaterally Skin: No rashes, lesions or ulcers Psychiatry: Judgement and insight appear normal. Mood & affect appropriate.     Data Reviewed: I have personally reviewed following labs and imaging studies  CBC: Recent Labs  Lab 01/10/19 2132  WBC 7.9  NEUTROABS 4.5  HGB 12.4*  HCT 37.5*  MCV 99.5  PLT 229   Basic Metabolic Panel: Recent Labs  Lab 01/10/19 2132 01/11/19 0344 01/11/19 1046  NA 135 136 135  K 4.4 4.0 4.0  CL 106  110 108  CO2 17* 15* 18*  GLUCOSE 109* 129* 102*  BUN 80* 80* 81*  CREATININE 3.68* 3.63* 3.58*  CALCIUM 8.7* 8.4* 8.5*   GFR: CrCl cannot be calculated (Unknown ideal weight.). Liver Function Tests: Recent Labs  Lab 01/10/19 2132 01/11/19 0344  AST 117* 94*  ALT 69* 56*  ALKPHOS 373* 304*  BILITOT 1.2 1.0  PROT 6.0* 4.9*  ALBUMIN 2.6* 2.1*   No results for input(s): LIPASE, AMYLASE in the last 168 hours. No results for input(s): AMMONIA in the last 168 hours. Coagulation Profile: Recent Labs  Lab 01/10/19 2132  INR 1.2   Cardiac Enzymes: No results for input(s): CKTOTAL, CKMB, CKMBINDEX, TROPONINI in the last 168 hours. BNP (last 3 results) No results for input(s): PROBNP in the last 8760 hours. HbA1C: No results for input(s): HGBA1C in the last 72 hours. CBG: Recent Labs  Lab 01/11/19 0114 01/11/19 0751 01/11/19 1157  GLUCAP 85 131* 101*   Lipid Profile: No results for input(s): CHOL, HDL, LDLCALC, TRIG, CHOLHDL, LDLDIRECT in the last 72 hours. Thyroid Function Tests: Recent Labs    01/11/19 0114  TSH 2.240  FREET4 1.37*   Anemia Panel: No results for input(s): VITAMINB12, FOLATE,  FERRITIN, TIBC, IRON, RETICCTPCT in the last 72 hours. Sepsis Labs: No results for input(s): PROCALCITON, LATICACIDVEN in the last 168 hours.  Recent Results (from the past 240 hour(s))  SARS CORONAVIRUS 2 (TAT 6-24 HRS) Nasopharyngeal Nasopharyngeal Swab     Status: None   Collection Time: 01/10/19 11:35 PM   Specimen: Nasopharyngeal Swab  Result Value Ref Range Status   SARS Coronavirus 2 NEGATIVE NEGATIVE Final    Comment: (NOTE) SARS-CoV-2 target nucleic acids are NOT DETECTED. The SARS-CoV-2 RNA is generally detectable in upper and lower respiratory specimens during the acute phase of infection. Negative results do not preclude SARS-CoV-2 infection, do not rule out co-infections with other pathogens, and should not be used as the sole basis for treatment or other  patient management decisions. Negative results must be combined with clinical observations, patient history, and epidemiological information. The expected result is Negative. Fact Sheet for Patients: SugarRoll.be Fact Sheet for Healthcare Providers: https://www.woods-mathews.com/ This test is not yet approved or cleared by the [place] FDA and  has been authorized for detection and/or diagnosis of SARS-CoV-2 by FDA under an Emergency Use Authorization (EUA). This EUA will remain  in effect (meaning this test can be used) for the duration of the COVID-19 declaration under Section 56 4(b)(1) of the Act, 21 U.S.C. section 360bbb-3(b)(1), unless the authorization is terminated or revoked sooner. Performed at [facility], [place] [address], [place], [place] [address]          Radiology Studies: Dg Chest 2 View  Result Date: 01/11/2019 CLINICAL DATA:  Lower extremity edema. Volume overload. EXAM: CHEST - 2 VIEW COMPARISON:  Radiograph 10/12/2018 FINDINGS: Low lung volumes limit assessment. Cardiomegaly. Left pleural effusion is moderate in size, associated basilar airspace disease likely atelectasis. No pulmonary edema. Right lung is clear. No pneumothorax. IMPRESSION: 1. Moderate left pleural effusion with associated basilar airspace disease, likely atelectasis. 2. Cardiomegaly without pulmonary edema. Electronically Signed   By: Keith Rake M.D.   On: 01/11/2019 01:49   US Renal  Result Date: 01/11/2019 CLINICAL DATA:  Initial evaluation for acute renal injury. EXAM: RENAL / URINARY TRACT ULTRASOUND COMPLETE COMPARISON:  Prior ultrasound from 04/17/2013 FINDINGS: Right Kidney: Renal measurements: 11.1 x 4.8 x 5.4 cm = volume: 150.6 mL . Echogenicity within normal limits. No mass or hydronephrosis visualized. Left Kidney: Renal measurements: 11.2 x 5.3 x 5.6 cm = volume: 173.1 mL. Echogenicity within normal limits. No nephrolithiasis  or hydronephrosis. 1.4 cm simple exophytic cyst present at the upper pole. Additional 2.5 cm simple cyst present within the mid-upper kidney. Bladder: Appears normal for degree of bladder distention. Other: Moderate volume ascites seen within the partially visualized abdomen. Liver appears cirrhotic with overall small size and nodular contour. IMPRESSION: 1. No hydronephrosis or other acute finding. 2. Hepatic cirrhosis with moderate volume ascites. 3. Few small simple left renal cysts as above. Electronically Signed   By: Jeannine Boga M.D.   On: 01/11/2019 02:10        Scheduled Meds: . folic acid  1 mg Oral Daily  . furosemide  40 mg Intravenous BID  . heparin  5,000 Units Subcutaneous Q8H  . insulin aspart  0-5 Units Subcutaneous QHS  . insulin aspart  0-9 Units Subcutaneous TID WC  . multivitamin with minerals  1 tablet Oral Daily  . thiamine  100 mg Oral Daily   Continuous Infusions: . albumin human       LOS: 1 day    Time spent: over  30 min    Fayrene Helper, MD [facility] Pager AMION  If 7PM-7AM, please contact night-coverage [email or web address] Password [email or web address] 01/11/2019, 1:28 PM

## 2019-01-11 NOTE — ED Notes (Signed)
Breakfast tray set up at bedside, pt stated he did not want to eat at this time.

## 2019-01-11 NOTE — ED Notes (Signed)
Patient transported to X-ray 

## 2019-01-11 NOTE — Evaluation (Signed)
Physical Therapy Evaluation and Discharge Patient Details Name: Daniel [name] MRN: [id number] DOB: Nov 21, 1933 Today's Date: 01/11/2019   History of Present Illness  Pt is an 85 y/o male admitted secondary to increased LE edema likely from related to decompensated liver cirrhosis. PMH includes alcohol dependence, DM, liver cirrhosis, CAD s/p stent placement, CKD, HTN, and prostate cancer.   Clinical Impression  Pt admitted secondary to problem above with deficits below. Pt requiring max A to roll from side to side this session. Pt with notable swelling in BLE.  Pt reports he is from SNF and staff uses hoyer lift to transfer to [facility]. Pt likely close to baseline, so will defer further needs to SNF. Will sign off. If needs change, please re-consult.     Follow Up Recommendations SNF;Supervision/Assistance - 24 hour    Equipment Recommendations  None recommended by PT    Recommendations for Other Services       Precautions / Restrictions Precautions Precautions: Fall Restrictions Weight Bearing Restrictions: No      Mobility  Bed Mobility Overal bed mobility: Needs Assistance Bed Mobility: Rolling Rolling: Max assist         General bed mobility comments: Max A to roll from side to side. Cues for hand placement on rails. Pt able to assist some with UEs.   Transfers                 General transfer comment: Per pt, uses hoyer lift for transfers.   Ambulation/Gait                Stairs            Wheelchair Mobility    Modified Rankin (Stroke Patients Only)       Balance                                             Pertinent Vitals/Pain Pain Assessment: No/denies pain    Home Living Family/patient expects to be discharged to:: Skilled nursing facility                      Prior Function Level of Independence: Needs assistance   Gait / Transfers Assistance Needed: Reports staff has to use lift to transfer to [facility]  at SNF.   ADL's / Homemaking Assistance Needed: Reports staff assisted with bathing and dressing at SNF         Hand Dominance        Extremity/Trunk Assessment   Upper Extremity Assessment Upper Extremity Assessment: Generalized weakness    Lower Extremity Assessment Lower Extremity Assessment: Generalized weakness;RLE deficits/detail;LLE deficits/detail RLE Deficits / Details: Able to perform partial heel slide. Pt with blister on R shin. Noted pitting edema. Unable to perform ankle pump secondary to swelling.  LLE Deficits / Details: Able to perform partial heel slide. Pitting edema noted. Unable to perform ankle pump secondary to swelling.        Communication   Communication: No difficulties  Cognition Arousal/Alertness: Awake/alert Behavior During Therapy: WFL for tasks assessed/performed Overall Cognitive Status: No family/caregiver present to determine baseline cognitive functioning                                 General Comments: Pt reporting conflicting information when asking about living arrangements. Reports  he was at [facility] and then began talking about his home. Per notes, pt from SNF.       General Comments      Exercises General Exercises - Lower Extremity Heel Slides: AAROM;Both;10 reps;Supine   Assessment/Plan    PT Assessment All further PT needs can be met in the next venue of care  PT Problem List Decreased strength;Decreased balance;Decreased mobility;Decreased range of motion;Decreased knowledge of precautions;Decreased cognition;Decreased knowledge of use of DME       PT Treatment Interventions      PT Goals (Current goals can be found in the Care Plan section)  Acute Rehab PT Goals Patient Stated Goal: none stated PT Goal Formulation: With patient Time For Goal Achievement: 01/11/19 Potential to Achieve Goals: Fair    Frequency     Barriers to discharge        Co-evaluation               AM-PAC PT "6 Clicks"  Mobility  Outcome Measure Help needed turning from your back to your side while in a flat bed without using bedrails?: Total Help needed moving from lying on your back to sitting on the side of a flat bed without using bedrails?: Total Help needed moving to and from a bed to a chair (including a wheelchair)?: Total Help needed standing up from a chair using your arms (e.g., wheelchair or bedside chair)?: Total Help needed to walk in hospital room?: Total Help needed climbing 3-5 steps with a railing? : Total 6 Click Score: 6    End of Session   Activity Tolerance: Patient tolerated treatment well Patient left: in bed;with call bell/phone within reach;with bed alarm set Nurse Communication: Mobility status PT Visit Diagnosis: Difficulty in walking, not elsewhere classified (R26.2);Muscle weakness (generalized) (M62.81)    Time: 1345-1400 PT Time Calculation (min) (ACUTE ONLY): 15 min   Charges:   PT Evaluation $PT Eval Moderate Complexity: [place], PT, DPT  Acute Rehabilitation Services  Pager: [phone] Office: [phone]   Rudean Hitt 01/11/2019, 5:46 PM

## 2019-01-11 NOTE — ED Notes (Signed)
Tele   Breakfast ordered  

## 2019-01-12 ENCOUNTER — Inpatient Hospital Stay (HOSPITAL_COMMUNITY): Payer: Medicare Other

## 2019-01-12 ENCOUNTER — Encounter (HOSPITAL_COMMUNITY): Payer: Self-pay | Admitting: Radiology

## 2019-01-12 DIAGNOSIS — R609 Edema, unspecified: Secondary | ICD-10-CM | POA: Diagnosis not present

## 2019-01-12 HISTORY — PX: IR PARACENTESIS: IMG2679

## 2019-01-12 LAB — COMPREHENSIVE METABOLIC PANEL
ALT: 53 U/L — ABNORMAL HIGH (ref 0–44)
AST: 85 U/L — ABNORMAL HIGH (ref 15–41)
Albumin: 2.4 g/dL — ABNORMAL LOW (ref 3.5–5.0)
Alkaline Phosphatase: 313 U/L — ABNORMAL HIGH (ref 38–126)
Anion gap: 12 (ref 5–15)
BUN: 82 mg/dL — ABNORMAL HIGH (ref 8–23)
CO2: 15 mmol/L — ABNORMAL LOW (ref 22–32)
Calcium: 8.4 mg/dL — ABNORMAL LOW (ref 8.9–10.3)
Chloride: 109 mmol/L (ref 98–111)
Creatinine, Ser: 3.82 mg/dL — ABNORMAL HIGH (ref 0.61–1.24)
GFR calc Af Amer: 16 mL/min — ABNORMAL LOW (ref >60)
GFR calc non Af Amer: 14 mL/min — ABNORMAL LOW (ref >60)
Glucose, Bld: 89 mg/dL (ref 70–99)
Potassium: 3.8 mmol/L (ref 3.5–5.1)
Sodium: 136 mmol/L (ref 135–145)
Total Bilirubin: 1.1 mg/dL (ref 0.3–1.2)
Total Protein: 5 g/dL — ABNORMAL LOW (ref 6.5–8.1)

## 2019-01-12 LAB — BODY FLUID CELL COUNT WITH DIFFERENTIAL
Eos, Fluid: 0 %
Lymphs, Fluid: 37 %
Monocyte-Macrophage-Serous Fluid: 54 % (ref 50–90)
Neutrophil Count, Fluid: 9 % (ref 0–25)
Total Nucleated Cell Count, Fluid: 100 cu mm (ref 0–1000)

## 2019-01-12 LAB — GRAM STAIN

## 2019-01-12 LAB — ALBUMIN, PLEURAL OR PERITONEAL FLUID: Albumin, Fluid: <1 g/dL

## 2019-01-12 LAB — GLUCOSE, CAPILLARY
Glucose-Capillary: 86 mg/dL (ref 70–99)
Glucose-Capillary: 106 mg/dL — ABNORMAL HIGH (ref 70–99)
Glucose-Capillary: 91 mg/dL (ref 70–99)
Glucose-Capillary: 87 mg/dL (ref 70–99)

## 2019-01-12 LAB — CBC
HCT: 29.6 % — ABNORMAL LOW (ref 39.0–52.0)
Hemoglobin: 10.4 g/dL — ABNORMAL LOW (ref 13.0–17.0)
MCH: 33.1 pg (ref 26.0–34.0)
MCHC: 35.1 g/dL (ref 30.0–36.0)
MCV: 94.3 fL (ref 80.0–100.0)
Platelets: 150 10*3/uL (ref 150–400)
RBC: 3.14 MIL/uL — ABNORMAL LOW (ref 4.22–5.81)
RDW: 13.8 % (ref 11.5–15.5)
WBC: 7.5 10*3/uL (ref 4.0–10.5)
nRBC: 0 % (ref 0.0–0.2)

## 2019-01-12 LAB — HEMOGLOBIN AND HEMATOCRIT, BLOOD
HCT: 31.8 % — ABNORMAL LOW (ref 39.0–52.0)
Hemoglobin: 11.2 g/dL — ABNORMAL LOW (ref 13.0–17.0)

## 2019-01-12 LAB — MAGNESIUM: Magnesium: 2.3 mg/dL (ref 1.7–2.4)

## 2019-01-12 LAB — UREA NITROGEN, URINE: Urea Nitrogen, Ur: 468 mg/dL

## 2019-01-12 MED ORDER — LIDOCAINE HCL (PF) 1 % IJ SOLN
INTRAMUSCULAR | Status: DC | PRN
  Administered 2019-01-12: 10 mL

## 2019-01-12 MED ORDER — POTASSIUM CHLORIDE CRYS ER 20 MEQ PO TBCR
40.0000 meq | EXTENDED_RELEASE_TABLET | Freq: Every day | ORAL | Status: DC
  Administered 2019-01-12 – 2019-01-15 (×4): 40 meq via ORAL
  Filled 2019-01-12 (×5): qty 2
  Filled 2019-01-12: qty 4

## 2019-01-12 MED ORDER — SODIUM BICARBONATE 650 MG PO TABS
650.0000 mg | ORAL_TABLET | Freq: Two times a day (BID) | ORAL | Status: DC
  Administered 2019-01-12 – 2019-01-13 (×3): 650 mg via ORAL
  Filled 2019-01-12 (×3): qty 1

## 2019-01-12 MED ORDER — LIDOCAINE HCL 1 % IJ SOLN
INTRAMUSCULAR | Status: AC
  Filled 2019-01-12: qty 20

## 2019-01-12 NOTE — Progress Notes (Signed)
**Note De-Identified vi Obfusction** PROGRESS NOTE    CREEK GAN  CHY:[id number] DOB: [birth year] DOA: 01/10/2019 PCP: Jni Grvel, MD   Brief Nrrtive:  Daniel [name] is  85 y.o. mle with medicl history significnt of liver cirrhosis, lcohol dependence, CHF, CAD sttus post stent plcement, type 2 dibetes, CKD stge III, hypertension, hyperlipidemi, peripherl neuropthy, seizures, lcohol buse, prostte cncer sttus post rdioctive seed implnt presenting to the hospitl vi EMS from his nursing home for evlution of bilterl lower extremity edem extending up to his bdomen.  [name] is not sure why he is here.  He hs no complints.  Denies shortness of breth, orthopne, or lower extremity edem.  Denies chest pin.  Sttes his ppetite is good.  No dditionl history could be obtined from him.  ED Course: Brdycrdic with hert rte rnging from the 40s to 60s.  Not hypotensive.  Not tchypneic or hypoxic.  No leukocytosis.  Bicrb 17, nion gp 12.  Bicrb mildly low on lbs done  month go s well.  BUN 80, cretinine 3.6.  Bseline cretinine 1.3.  LFTs elevted (AST 117, ALT 69, lk phos 373).  LFTs were elevted on lbs done  month go s well.  High-sensitivity troponin 55.  EKG without cute ischemic chnges.  BNP 181.  SARS-CoV-2 test pending. [name] received IV Lsix 40 mg.  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  Does hve  history of CHF nd lst echo done in July 2020 with LVEF 45 to 50% nd left ventriculr distolic prmeters consistent with pseudonormliztion.  However, BNP not significntly elevted.  Chest x-ry showing moderte left pleurl effusion but no overt pulmonry edem.  [name] hs no  complints of dyspne or orthopne.  Not tchypneic or hypoxic.  Suspect his edem is relted to decompensted liver cirrhosis.  Albumin is low t 2.1. Ultrsound with evidence of heptic cirrhosis nd moderte volume scites.  -Apprecite renl ssistnce - ttempting midodrine nd IV lsix for diuresis  -prcentesis tody with 2 L off - no evidence of SBP - follow cx  -unble to strt spironolctone right now due to AKI  -consider therpeutic prcentesis +/- thorcentesis s well pending course -Monitor intke nd output, dily weights, low-sodium diet with fluid restriction  AKI on CKD 3   NAGMA BUN 80, cretinine 3.6.  Bseline cretinine 1.3.  AKI likely relted to volume overlod, intrvsculrly dry with hypolbuminemi, possibly relted to diuresis s well (pprently per son, he ws being mnged t nursing fcility for volume overlod nd my hve been getting higher doses of lsix?).  Heptorenl should be on differentil s well with his cirrhosis.  -cretinine worsened tody to 3.8 -IV Lsix for diuresis s bove, give lbumin with this this PM - Apprecite renl ssistnce -> diuresing with midodrine  - urine is blnd, follow urine lytes (urine N not suggestive of HRS) - bicrb - I/O, dily weights - Monitor urine output - Avoid nephrotoxic gents/contrst  Sinus brdycrdi Brdycrdic with hert rte rnging from the 40s to 60s.  Not hypotensive.  Asymptomtic.  Not on ny AV nodl blocking gent. -Check TSH, free T4 levels (T4 elevted) - mildly bnorml - follow repet outptient  -Crdic monitoring  Norml nion gp  metabolic acidosis Likely related to AKI. -Continue to monitor  Mildly elevated troponin High-sensitivity troponin 55 >49.  Troponin previously mildly elevated as well.  EKG without acute ischemic changes.  Patient has no complaints of chest pain and appears comfortable. -Cardiac monitoring  Insulin-dependent diabetes mellitus A1c 6.6 on  7/23. -Sliding scale insulin sensitive and CBG checks - BG's reasonable   History of alcohol dependence Patient is currently living in [name].  No signs of withdrawal. -CIWA protocol; Ativan as needed -Thiamine, folate, multivitamin  Anemia: continue to monitor, no si/sx of bleeding.  Down to 10.4 from 12.4 yesterday.  DVT prophylaxis: heparin  Code Status: DNR Family Communication: called son, no answer Disposition Plan: pending further improvement  Consultants:   none  Procedures:   none  Antimicrobials:  Anti-infectives (From admission, onward)   None     Subjective: No specific complaints Does not say much with his dementia  Objective: Vitals:   01/12/19 0738 01/12/19 1109 01/12/19 1443 01/12/19 1625  BP: (!) 116/48 125/60 (!) 110/35 (!) 117/56  Pulse: 69 70 68 72  Resp: _0 Temp: 97.7 F (36.5 C) 98 F (36.7 C) 98.1 F (36.7 C) (!) 97.4 F (36.3 C)  TempSrc: Oral  Axillary Oral  SpO2: 100% 98% 95% 98%  Weight:        Intake/Output Summary (Last 24 hours) at 01/12/2019 1654 Last data filed at 01/12/2019 1351 Gross per 24 hour  Intake 240 ml  Output 975 ml  Net -735 ml   Filed Weights   01/11/19 0932 01/12/19 0702  Weight: 92.3 kg 92.3 kg    Examination:  General: No acute distress. Cardiovascular: Heart sounds show [name] regular rate, and rhythm. Lungs: Clear to auscultation bilaterally  Abdomen: Soft, nontender, nondistended  Neurological: Alert.  Disoriented.  Answers very few questions, shakes head yes or no. Moves all extremities 4 . Cranial nerves II through XII grossly intact. Skin: Warm and dry. No rashes or lesions. Extremities: bilateral LE edema     Data Reviewed: I have personally reviewed following labs and imaging studies  CBC: Recent Labs  Lab 01/10/19 2132 01/12/19 0418  WBC 7.9 7.5  NEUTROABS 4.5  --   HGB 12.4* 10.4*  HCT 37.5* 29.6*  MCV 99.5 94.3  PLT 205 326   Basic Metabolic Panel: Recent  Labs  Lab 01/10/19 2132 01/11/19 0344 01/11/19 1046 01/11/19 1600 01/12/19 0418  NA 135 136 135 135 136  K 4.4 4.0 4.0 4.1 3.8  CL 106 110 108 106 109  CO2 17* 15* 18* 17* 15*  GLUCOSE 109* 129* 102* 129* 89  BUN 80* 80* 81* 79* 82*  CREATININE 3.68* 3.63* 3.58* 3.79* 3.82*  CALCIUM 8.7* 8.4* 8.5* 8.5* 8.4*  MG  --   --   --   --  2.3   GFR: Estimated Creatinine Clearance: 16.3 mL/min ([name]) (by C-G formula based on SCr of 3.82 mg/dL (H)). Liver Function Tests: Recent Labs  Lab 01/10/19 2132 01/11/19 0344 01/12/19 0418  AST 117* 94* 85*  ALT 69* 56* 53*  ALKPHOS 373* 304* 313*  BILITOT 1.2 1.0 1.1  PROT 6.0* 4.9* 5.0*  ALBUMIN 2.6* 2.1* 2.4*   No results for input(s): LIPASE, AMYLASE in the last 168 hours. No results for input(s): AMMONIA in the last 168 hours. Coagulation Profile: Recent Labs  Lab 01/10/19 2132  INR 1.2   Cardiac Enzymes: No results for input(s): CKTOTAL, CKMB, CKMBINDEX, TROPONINI in the last 168  hours. BNP (last 3 results) No results for input(s): PROBNP in the last 8760 hours. HbA1C: No results for input(s): HGBA1C in the last 72 hours. CBG: Recent Labs  Lab 01/11/19 1622 01/11/19 2149 01/12/19 0606 01/12/19 1107 01/12/19 1622  GLUCAP 123* 102* 86 106* 91   Lipid Profile: No results for input(s): CHOL, HDL, LDLCALC, TRIG, CHOLHDL, LDLDIRECT in the last 72 hours. Thyroid Function Tests: Recent Labs    01/11/19 0114  TSH 2.240  FREET4 1.37*   Anemia Panel: No results for input(s): VITAMINB12, FOLATE, FERRITIN, TIBC, IRON, RETICCTPCT in the last 72 hours. Sepsis Labs: No results for input(s): PROCALCITON, LATICACIDVEN in the last 168 hours.  Recent Results (from the past 240 hour(s))  SARS CORONAVIRUS 2 (TAT 6-24 HRS) Nasopharyngeal Nasopharyngeal Swab     Status: None   Collection Time: 01/10/19 11:35 PM   Specimen: Nasopharyngeal Swab  Result Value Ref Range Status   SARS Coronavirus 2 NEGATIVE NEGATIVE Final    Comment:  (NOTE) SARS-CoV-2 target nucleic acids are NOT DETECTED. The SARS-CoV-2 RNA is generally detectable in upper and lower respiratory specimens during the acute phase of infection. Negative results do not preclude SARS-CoV-2 infection, do not rule out co-infections with other pathogens, and should not be used as the sole basis for treatment or other patient management decisions. Negative results must be combined with clinical observations, patient history, and epidemiological information. The expected result is Negative. Fact Sheet for Patients: SugarRoll.be Fact Sheet for Healthcare Providers: https://www.woods-mathews.com/ This test is not yet approved or cleared by the [place] FDA and  has been authorized for detection and/or diagnosis of SARS-CoV-2 by FDA under an Emergency Use Authorization (EUA). This EUA will remain  in effect (meaning this test can be used) for the duration of the COVID-19 declaration under Section 56 4(b)(1) of the Act, 21 U.S.C. section 360bbb-3(b)(1), unless the authorization is terminated or revoked sooner. Performed at [facility], [place] [address], [place], [place] [address]   Gram stain     Status: None   Collection Time: 01/12/19  2:24 PM   Specimen: Abdomen; Peritoneal Fluid  Result Value Ref Range Status   Specimen Description PERITONEAL  Final   Special Requests NONE  Final   Gram Stain   Final    ABUNDANT WBC PRESENT, PREDOMINANTLY MONONUCLEAR NO ORGANISMS SEEN Performed at [facility], [place] [address] [place], [place] [address]    Report Status 01/12/2019 FINAL  Final         Radiology Studies: Dg Chest 2 View  Result Date: 01/11/2019 CLINICAL DATA:  Lower extremity edema. Volume overload. EXAM: CHEST - 2 VIEW COMPARISON:  Radiograph 10/12/2018 FINDINGS: Low lung volumes limit assessment. Cardiomegaly. Left pleural effusion is moderate in size, associated basilar airspace  disease likely atelectasis. No pulmonary edema. Right lung is clear. No pneumothorax. IMPRESSION: 1. Moderate left pleural effusion with associated basilar airspace disease, likely atelectasis. 2. Cardiomegaly without pulmonary edema. Electronically Signed   By: Keith Rake M.D.   On: 01/11/2019 01:49   US Renal  Result Date: 01/11/2019 CLINICAL DATA:  Initial evaluation for acute renal injury. EXAM: RENAL / URINARY TRACT ULTRASOUND COMPLETE COMPARISON:  Prior ultrasound from 04/17/2013 FINDINGS: Right Kidney: Renal measurements: 11.1 x 4.8 x 5.4 cm = volume: 150.6 mL . Echogenicity within normal limits. No mass or hydronephrosis visualized. Left Kidney: Renal measurements: 11.2 x 5.3 x 5.6 cm = volume: 173.1 mL. Echogenicity within normal limits. No nephrolithiasis or hydronephrosis. 1.4 cm **Note De-Identified vi Obfusction** simple exophytic cyst present t the upper pole. dditionl 2.5 cm simple cyst present within the mid-upper kidney. Bldder: ppers norml for degree of bldder distention. Other: Moderte volume scites seen within the prtilly visulized bdomen. Liver ppers cirrhotic with overll smll size nd nodulr contour. IMPRESSION: 1. No hydronephrosis or other cute finding. 2. Heptic cirrhosis with moderte volume scites. 3. Few smll simple left renl cysts s bove. Electroniclly Signed   By: Jennine Bog M.D.   On: 01/11/2019 02:10   Ir Prcentesis  Result Dte: 01/12/2019 INDICTION: bdominl distention. cute renl insufficiency. scites. Request for dignostic nd therpeutic prcentesis with mx 2 L. EXM: ULTRSOUND GUIDED LEFT LOWER QUDRNT PRCENTESIS MEDICTIONS: None. COMPLICTIONS: None immedite. PROCEDURE: Informed written consent ws obtined from the ptient fter  discussion of the risks, benefits nd lterntives to tretment.  timeout ws performed prior to the initition of the procedure. Initil ultrsound scnning demonstrtes  lrge mount of scites within the left lower  bdominl qudrnt. The left lower bdomen ws prepped nd drped in the usul sterile fshion. 1% lidocine ws used for locl nesthesi. Following this,  19 guge, 7-cm, Yueh ctheter ws introduced. n ultrsound imge ws sved for documenttion purposes. The prcentesis ws performed. The ctheter ws removed nd  dressing ws pplied. The ptient tolerted the procedure well without immedite post procedurl compliction. FINDINGS:  totl of pproximtely 2 L of cler yellow fluid ws removed. Smples were sent to the lbortory s requested by the clinicl tem. IMPRESSION: Successful ultrsound-guided prcentesis yielding 2 liters of peritonel fluid. Red by: scencion Dike P-C Electroniclly Signed   By: Jerilynn Mges.  Shick M.D.   On: 01/12/2019 14:09        Scheduled Meds:  folic cid  1 mg Orl Dily   furosemide  40 mg Intrvenous Q6H   heprin  5,000 Units Subcutneous Q8H   insulin sprt  0-5 Units Subcutneous QHS   insulin sprt  0-9 Units Subcutneous TID WC   lidocine       midodrine  10 mg Orl TID WC   multivitmin with minerls  1 tblet Orl Dily   potssium chloride  40 mEq Orl Dily   sodium bicrbonte  650 mg Orl BID   thimine  100 mg Orl Dily   Continuous Infusions:    LOS: 2 dys    Time spent: over 30 min    Fyrene Helper, MD [facility] MION  If 7PM-7M, plese contct night-coverge [email or web address] [facility] 01/12/2019, 4:54 PM

## 2019-01-12 NOTE — Procedures (Signed)
PROCEDURE SUMMARY:  Successful US guided paracentesis from [facility].  Yielded 2 L of clear yellow fluid.  No immediate complications.  Pt tolerated well.   Specimen was sent for labs.  EBL < 80mL  Ascencion Dike PA-C 01/12/2019 2:07 PM

## 2019-01-12 NOTE — Progress Notes (Addendum)
Patient is back from procedure, vitals taken BP was 110/35, MD Florene Glen aware. Patient was offered lunch but he said he wants to rest a little bit.

## 2019-01-12 NOTE — TOC Initial Note (Addendum)
Transition of Care [facility]) - Initial/Assessment Note    Patient Details  Name: Daniel [name] MRN: [id number] Date of Birth: Nov 24, 1933  Transition of [facility]) CM/SW Contact:    Zenon Mayo, RN Phone Number: 01/12/2019, 5:09 PM  Clinical Narrative:                 Patient is from [facility] in [place] on [address].  He has given NCM permission to speak with wife, he is confused.  Per pt eval rec SNF.  10/23 Tomi Bamberger RN, BSN- NCM spoke with wife, she states that she would like for patient to go to SNF to get therapy she would like him to go to the SNF there where is lives and also to check into [facility].   She also would like NCM to call her son [name].  Awaiting auth from [facility].  Will need ot eval and updated pt eval.    Expected Discharge Plan: [place] Barriers to Discharge: No Barriers Identified   Patient Goals and CMS Choice Patient states their goals for this hospitalization and ongoing recovery are:: get better      Expected Discharge Plan and Services Expected Discharge Plan: [place] In-house Referral: NA Discharge Planning Services: CM Consult   Living arrangements for the past 2 months: [place]                 DME Arranged: (NA)         HH Arranged: NA          Prior Living Arrangements/Services Living arrangements for the past 2 months: [place] Lives with:: Spouse Patient language and need for interpreter reviewed:: Yes        Need for Family Participation in Patient Care: Yes (Comment) Care giver support system in place?: Yes (comment)   Criminal Activity/Legal Involvement Pertinent to Current Situation/Hospitalization: No - Comment as needed  Activities of Daily Living      Permission Sought/Granted      Share Information with NAME: Mrs [place]     Permission granted to share info w Relationship: wife     Emotional  Assessment Appearance:: Appears stated age Attitude/Demeanor/Rapport: Gracious Affect (typically observed): Appropriate Orientation: : Oriented to Self Alcohol / Substance Use: Not Applicable Psych Involvement: No (comment)  Admission diagnosis:  Anasarca [R60.1] Volume overload [E87.70] AKI (acute kidney injury) ([place]) [N17.9] Patient Active Problem List   Diagnosis Date Noted  . AKI (acute kidney injury) ([place]) 01/11/2019  . Bradycardia 01/11/2019  . Edema 01/10/2019  . Evaluation by psychiatric service required   . Acute metabolic encephalopathy 32/95/1884  . Gait instability 10/12/2018  . HLD (hyperlipidemia) 10/12/2018  . Elevated troponin 10/12/2018  . Hypoglycemia 07/19/2017  . Hypotension 07/19/2017  . Normochromic normocytic anemia 07/19/2017  . Thrombocytopenia ([place]) 07/19/2017  . CKD (chronic kidney disease), stage III 07/19/2017  . Seizure ([place]) 06/16/2017  . Type II diabetes mellitus with neurological manifestations ([place]) 03/05/2015  . Porokeratosis 03/05/2015  . Chronic systolic CHF (congestive heart failure) ([place]) 01/28/2015  . Coronary artery disease 01/28/2015  . Bruit of left carotid artery 01/28/2015  . 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 H/O detached retina repair 08/23/2014  . Acute on chronic renal failure ([place]) 04/17/2013  . Dehydration 04/17/2013  . Alcohol dependence ([place]) 04/17/2013  . Transaminasemia 04/17/2013  . Type II diabetes mellitus with renal manifestations ([place]) 04/17/2013  . Near syncope 04/17/2013  .  UTI (lower urinary tract infection) 04/17/2013  . Hematuria 04/17/2013   PCP:  Jani Gravel, MD Pharmacy:  No Pharmacies Listed    Social Determinants of Health (SDOH) Interventions    Readmission Risk Interventions Readmission Risk Prevention Plan 01/12/2019  Transportation Screening Complete  HRI or [place] Complete  Social Work Consult for [place] Planning/Counseling Complete  Palliative  Care Screening Not Applicable  Medication Review Press photographer) Complete  Some recent data might be hidden

## 2019-01-12 NOTE — Progress Notes (Signed)
Subjective:  Urine sodium is 37 which argues against HRS.  Only 600 of UOP recorded but was more than previous- possibly some not recorded.  Renal function a little worse than yesterday    Objective Vital signs in last 24 hours: Vitals:   01/12/19 0117 01/12/19 0413 01/12/19 0702 01/12/19 0738  BP: (!) 112/51 (!) 117/52  (!) 116/48  Pulse: 76 73  69  Resp: 20 20  18   Temp: 97.9 F (36.6 C) 97.7 F (36.5 C)  97.7 F (36.5 C)  TempSrc: Oral Oral  Oral  SpO2: 99% 98%  100%  Weight:   92.3 kg    Weight change:   Intake/Output Summary (Last 24 hours) at 01/12/2019 2979 Last data filed at 01/12/2019 8921 Gross per 24 hour  Intake -  Output 600 ml  Net -600 ml    Assessment/Plan: 85 year old white male nursing home resident with multiple medical issues including some stage III CKD and dementia.  He presents with volume overload and acute on chronic renal failure 1.Renal-acute on chronic renal failure (baseline 1.4 in August of 2020)  in the setting of cirrhosis, decreased ejection fraction, and volume overload.  He was on Cozaar as an outpatient and blood pressure is soft.  That along with a fairly normal renal ultrasound and a bland urinalysis leads me to believe that this acute kidney injury has been hemodynamically mediated.  Either by cardiorenal syndrome, hepatorenal syndrome or just hypotension on an ARB.  ARB has been held.  He is no longer on any antihypertensives.  Urine sodium not consistent with hepatorenal.  What I am trying is to place him on midodrine and  around-the-clock IV Lasix to try to achieve some diuresis.  His albumin is low so he is third spacing but he does not have nephrotic syndrome.  As an aside, I do not feel that he is a candidate for dialysis therapy given his chronic debilitated state, age and presumed dementia but there are no indications for dialysis today  2. Hypertension/volume  -volume overloaded with a soft blood pressure.  All antihypertensives have been  stopped.  attempting midodrine and IV Lasix for diuresis- will continue current dosing - maybe some progress 3.  Electrolytes-potassium a little low, will attempt to replete as is diuresis is successful it will go down more  4.  Metabolic acidosis-in the setting of acute kidney injury.  Will give sodium bicarb  4. Anemia  -is not significant at this time nor does it require treatment at this time but supposedly it did drop- follow      [place]: Basic Metabolic Panel: Recent Labs  Lab 01/11/19 1046 01/11/19 1600 01/12/19 0418  NA 135 135 136  K 4.0 4.1 3.8  CL 108 106 109  CO2 18* 17* 15*  GLUCOSE 102* 129* 89  BUN 81* 79* 82*  CREATININE 3.58* 3.79* 3.82*  CALCIUM 8.5* 8.5* 8.4*   Liver Function Tests: Recent Labs  Lab 01/10/19 2132 01/11/19 0344 01/12/19 0418  AST 117* 94* 85*  ALT 69* 56* 53*  ALKPHOS 373* 304* 313*  BILITOT 1.2 1.0 1.1  PROT 6.0* 4.9* 5.0*  ALBUMIN 2.6* 2.1* 2.4*   No results for input(s): LIPASE, AMYLASE in the last 168 hours. No results for input(s): AMMONIA in the last 168 hours. CBC: Recent Labs  Lab 01/10/19 2132 01/12/19 0418  WBC 7.9 7.5  NEUTROABS 4.5  --   HGB 12.4* 10.4*  HCT 37.5* 29.6*  MCV  99.5 94.3  PLT 205 150   Cardiac Enzymes: No results for input(s): CKTOTAL, CKMB, CKMBINDEX, TROPONINI in the last 168 hours. CBG: Recent Labs  Lab 01/11/19 0751 01/11/19 1157 01/11/19 1622 01/11/19 2149 01/12/19 0606  GLUCAP 131* 101* 123* 102* 86    Iron Studies: No results for input(s): IRON, TIBC, TRANSFERRIN, FERRITIN in the last 72 hours. Studies/Results: Dg Chest 2 View  Result Date: 01/11/2019 CLINICAL DATA:  Lower extremity edema. Volume overload. EXAM: CHEST - 2 VIEW COMPARISON:  Radiograph 10/12/2018 FINDINGS: Low lung volumes limit assessment. Cardiomegaly. Left pleural effusion is moderate in size, associated basilar airspace disease likely atelectasis. No pulmonary edema. Right lung is  clear. No pneumothorax. IMPRESSION: 1. Moderate left pleural effusion with associated basilar airspace disease, likely atelectasis. 2. Cardiomegaly without pulmonary edema. Electronically Signed   By: Keith Rake M.D.   On: 01/11/2019 01:49   US Renal  Result Date: 01/11/2019 CLINICAL DATA:  Initial evaluation for acute renal injury. EXAM: RENAL / URINARY TRACT ULTRASOUND COMPLETE COMPARISON:  Prior ultrasound from 04/17/2013 FINDINGS: Right Kidney: Renal measurements: 11.1 x 4.8 x 5.4 cm = volume: 150.6 mL . Echogenicity within normal limits. No mass or hydronephrosis visualized. Left Kidney: Renal measurements: 11.2 x 5.3 x 5.6 cm = volume: 173.1 mL. Echogenicity within normal limits. No nephrolithiasis or hydronephrosis. 1.4 cm simple exophytic cyst present at the upper pole. Additional 2.5 cm simple cyst present within the mid-upper kidney. Bladder: Appears normal for degree of bladder distention. Other: Moderate volume ascites seen within the partially visualized abdomen. Liver appears cirrhotic with overall small size and nodular contour. IMPRESSION: 1. No hydronephrosis or other acute finding. 2. Hepatic cirrhosis with moderate volume ascites. 3. Few small simple left renal cysts as above. Electronically Signed   By: Jeannine Boga M.D.   On: 01/11/2019 02:10   Medications: Infusions:   Scheduled Medications: . folic acid  1 mg Oral Daily  . furosemide  40 mg Intravenous Q6H  . heparin  5,000 Units Subcutaneous Q8H  . insulin aspart  0-5 Units Subcutaneous QHS  . insulin aspart  0-9 Units Subcutaneous TID WC  . midodrine  10 mg Oral TID WC  . multivitamin with minerals  1 tablet Oral Daily  . thiamine  100 mg Oral Daily    have reviewed scheduled and prn medications.  Physical Exam: General: pleasant, not oriented-  Breakfast tray not touched Heart: RRR Lungs: mostly clear Abdomen: distended/ascites- non tender Extremities: pitting edema     01/12/2019,8:12 AM  LOS:  2 days

## 2019-01-12 NOTE — Progress Notes (Signed)
Patient's wife Graeme Menees, all questions answered to her satisfactions, and she has been given updates.

## 2019-01-13 ENCOUNTER — Encounter (HOSPITAL_COMMUNITY): Payer: Self-pay | Admitting: General Practice

## 2019-01-13 DIAGNOSIS — R609 Edema, unspecified: Secondary | ICD-10-CM | POA: Diagnosis not present

## 2019-01-13 DIAGNOSIS — R601 Generalized edema: Secondary | ICD-10-CM

## 2019-01-13 HISTORY — DX: Generalized edema: R60.1

## 2019-01-13 LAB — GLUCOSE, CAPILLARY
Glucose-Capillary: 98 mg/dL (ref 70–99)
Glucose-Capillary: 88 mg/dL (ref 70–99)
Glucose-Capillary: 106 mg/dL — ABNORMAL HIGH (ref 70–99)
Glucose-Capillary: 129 mg/dL — ABNORMAL HIGH (ref 70–99)

## 2019-01-13 LAB — COMPREHENSIVE METABOLIC PANEL
ALT: 51 U/L — ABNORMAL HIGH (ref 0–44)
AST: 82 U/L — ABNORMAL HIGH (ref 15–41)
Albumin: 2.4 g/dL — ABNORMAL LOW (ref 3.5–5.0)
Alkaline Phosphatase: 290 U/L — ABNORMAL HIGH (ref 38–126)
Anion gap: 13 (ref 5–15)
BUN: 84 mg/dL — ABNORMAL HIGH (ref 8–23)
CO2: 16 mmol/L — ABNORMAL LOW (ref 22–32)
Calcium: 8.7 mg/dL — ABNORMAL LOW (ref 8.9–10.3)
Chloride: 108 mmol/L (ref 98–111)
Creatinine, Ser: 3.9 mg/dL — ABNORMAL HIGH (ref 0.61–1.24)
GFR calc Af Amer: 15 mL/min — ABNORMAL LOW (ref >60)
GFR calc non Af Amer: 13 mL/min — ABNORMAL LOW (ref >60)
Glucose, Bld: 95 mg/dL (ref 70–99)
Potassium: 4.1 mmol/L (ref 3.5–5.1)
Sodium: 137 mmol/L (ref 135–145)
Total Bilirubin: 1.5 mg/dL — ABNORMAL HIGH (ref 0.3–1.2)
Total Protein: 5.1 g/dL — ABNORMAL LOW (ref 6.5–8.1)

## 2019-01-13 LAB — CBC
HCT: 30.7 % — ABNORMAL LOW (ref 39.0–52.0)
Hemoglobin: 10.7 g/dL — ABNORMAL LOW (ref 13.0–17.0)
MCH: 33.2 pg (ref 26.0–34.0)
MCHC: 34.9 g/dL (ref 30.0–36.0)
MCV: 95.3 fL (ref 80.0–100.0)
Platelets: 164 10*3/uL (ref 150–400)
RBC: 3.22 MIL/uL — ABNORMAL LOW (ref 4.22–5.81)
RDW: 14.1 % (ref 11.5–15.5)
WBC: 8.2 10*3/uL (ref 4.0–10.5)
nRBC: 0 % (ref 0.0–0.2)

## 2019-01-13 LAB — PATHOLOGIST SMEAR REVIEW

## 2019-01-13 LAB — PHOSPHORUS: Phosphorus: 5 mg/dL — ABNORMAL HIGH (ref 2.5–4.6)

## 2019-01-13 LAB — MAGNESIUM: Magnesium: 2.3 mg/dL (ref 1.7–2.4)

## 2019-01-13 MED ORDER — FUROSEMIDE 10 MG/ML IJ SOLN
40.0000 mg | Freq: Two times a day (BID) | INTRAMUSCULAR | Status: DC
  Administered 2019-01-13 – 2019-01-14 (×2): 40 mg via INTRAVENOUS
  Filled 2019-01-13 (×3): qty 4

## 2019-01-13 MED ORDER — SODIUM BICARBONATE 650 MG PO TABS
1300.0000 mg | ORAL_TABLET | Freq: Two times a day (BID) | ORAL | Status: DC
  Administered 2019-01-13 – 2019-01-19 (×12): 1300 mg via ORAL
  Filled 2019-01-13 (×12): qty 2

## 2019-01-13 MED ORDER — PRO-STAT SUGAR FREE PO LIQD
30.0000 mL | Freq: Three times a day (TID) | ORAL | Status: DC
  Administered 2019-01-13 – 2019-01-19 (×14): 30 mL via ORAL
  Filled 2019-01-13 (×15): qty 30

## 2019-01-13 NOTE — Evaluation (Signed)
Physical Therapy Re-Evaluation Patient Details Name: Daniel [name] MRN: [id number] DOB: 08-20-1933 Today's Date: 01/13/2019   History of Present Illness  Pt is an 85 y/o male admitted secondary to increased LE edema likely from related to decompensated liver cirrhosis. PMH includes alcohol dependence, DM, liver cirrhosis, CAD s/p stent placement, CKD, HTN, and prostate cancer.   Clinical Impression  Pt admitted secondary to problem above with deficits below. Pt seen for re-evaluation as pt originally from ALF and was able to ambulate short distances with assistance. Pt requiring max A +2 to stand at EOB. Pt with posterior lean and unable to lift LEs to take steps at EOB. Feel pt would benefit from SNF level therapies prior to return to ALF to increase independence with mobility tasks. Will continue to follow acutely to maximize functional mobility independence and safety.     Follow Up Recommendations SNF;Supervision/Assistance - 24 hour    Equipment Recommendations  None recommended by PT    Recommendations for Other Services       Precautions / Restrictions Precautions Precautions: Fall Restrictions Weight Bearing Restrictions: No      Mobility  Bed Mobility Overal bed mobility: Needs Assistance Bed Mobility: Supine to Sit;Sit to Supine;Rolling Rolling: Mod assist   Supine to sit: Mod assist;+2 for physical assistance Sit to supine: Total assist;+2 for physical assistance   General bed mobility comments: Mod A for rolling for clean up following BM. Mod A +2 for LE assist and trunk assist to come to sitting. increased time required. Total A +2 to return to supine.   Transfers Overall transfer level: Needs assistance Equipment used: Rolling walker (2 wheeled) Transfers: Sit to/from Stand Sit to Stand: Max assist;+2 physical assistance         General transfer comment: Max +2 to stand at EOB. Pt with posterior lean upon standing. Was unable to lift LEs to take steps  at EOB.   Ambulation/Gait                Stairs            Wheelchair Mobility    Modified Rankin (Stroke Patients Only)       Balance Overall balance assessment: Needs assistance Sitting-balance support: No upper extremity supported;Feet supported Sitting balance-Leahy Scale: Fair   Postural control: Posterior lean Standing balance support: Bilateral upper extremity supported Standing balance-Leahy Scale: Poor Standing balance comment: Reliant on BUE support and external support for balance.                              Pertinent Vitals/Pain Pain Assessment: No/denies pain    Home Living Family/patient expects to be discharged to:: Skilled nursing facility                      Prior Function Level of Independence: Needs assistance   Gait / Transfers Assistance Needed: Per son, pt was working with therapy on walking at ALF, but mainly used WC for mobility.   ADL's / Homemaking Assistance Needed: Son reports staff assisted with transfers.         Hand Dominance        Extremity/Trunk Assessment   Upper Extremity Assessment Upper Extremity Assessment: Defer to OT evaluation    Lower Extremity Assessment Lower Extremity Assessment: Generalized weakness;RLE deficits/detail;LLE deficits/detail RLE Deficits / Details: Able to perform heel slide and ankle pump with assist. Blister noted on shin.  LLE Deficits /  Details: Decreased DF secondary to swelling and PF tightness. Able to perform heel slide with assist.     Cervical / Trunk Assessment Cervical / Trunk Assessment: Kyphotic  Communication   Communication: No difficulties  Cognition Arousal/Alertness: Awake/alert Behavior During Therapy: WFL for tasks assessed/performed Overall Cognitive Status: History of cognitive impairments - at baseline                                 General Comments: Per son, pt has intermittent confusion and cognitive deficits at  baseline.       General Comments General comments (skin integrity, edema, etc.): Pt's son present during session. Answered questions about SNF recommendations.     Exercises     Assessment/Plan    PT Assessment Patient needs continued PT services  PT Problem List Decreased strength;Decreased balance;Decreased mobility;Decreased range of motion;Decreased knowledge of precautions;Decreased cognition;Decreased knowledge of use of DME       PT Treatment Interventions Gait training;DME instruction;Functional mobility training;Therapeutic activities;Therapeutic exercise;Balance training;Patient/family education;Cognitive remediation    PT Goals (Current goals can be found in the Care Plan section)  Acute Rehab PT Goals Patient Stated Goal: Per son, for pt to return back to ALF following rehab PT Goal Formulation: With patient/family Time For Goal Achievement: 01/27/19 Potential to Achieve Goals: Fair    Frequency Min 2X/week   Barriers to discharge        Co-evaluation PT/OT/SLP Co-Evaluation/Treatment: Yes Reason for Co-Treatment: To address functional/ADL transfers;Necessary to address cognition/behavior during functional activity;For patient/therapist safety PT goals addressed during session: Mobility/safety with mobility;Balance;Proper use of DME         AM-PAC PT "6 Clicks" Mobility  Outcome Measure Help needed turning from your back to your side while in a flat bed without using bedrails?: A Lot Help needed moving from lying on your back to sitting on the side of a flat bed without using bedrails?: A Lot Help needed moving to and from a bed to a chair (including a wheelchair)?: Total Help needed standing up from a chair using your arms (e.g., wheelchair or bedside chair)?: Total Help needed to walk in hospital room?: Total Help needed climbing 3-5 steps with a railing? : Total 6 Click Score: 8    End of Session Equipment Utilized During Treatment: Gait belt Activity  Tolerance: Patient tolerated treatment well Patient left: in bed;with bed alarm set;with call bell/phone within reach;with family/visitor present Nurse Communication: Mobility status PT Visit Diagnosis: Difficulty in walking, not elsewhere classified (R26.2);Muscle weakness (generalized) (M62.81);Unsteadiness on feet (R26.81)    Time: 6962-9528 PT Time Calculation (min) (ACUTE ONLY): 43 min   Charges:   PT Evaluation $PT Re-evaluation: 1 Re-eval PT Treatments $Therapeutic Activity: 8-22 mins        Leighton Ruff, PT, DPT  Acute Rehabilitation Services  Pager: [phone] Office: [phone]   Rudean Hitt 01/13/2019, 12:37 PM

## 2019-01-13 NOTE — Progress Notes (Signed)
Initial Nutrition Assessment  DOCUMENTATION CODES:   Morbid obesity  INTERVENTION:   -Downgrade diet to dysphagia 3 (advanced mechanical soft) for ease of intake -MVI with minerals daily -30 ml Prostat TID, each supplement provides 100 kclas and 15 grams protein -Magic Cup TID with meals, each supplement provides 290 kcals and 9 grams protein -Feeding assistance with meals  NUTRITION DIAGNOSIS:   Inadequate oral intake related to lethargy/confusion as evidenced by meal completion < 25%.   GOAL:   Patient will meet greater than or equal to 90% of their needs  MONITOR:   PO intake, Supplement acceptance, Labs, Weight trends, Skin, I & O's  REASON FOR ASSESSMENT:   Consult Assessment of nutrition requirement/status  ASSESSMENT:   Daniel [name] is a 85 y.o. male with medical history significant of liver cirrhosis, alcohol dependence, CHF, CAD status post stent placement, type 2 diabetes, CKD stage III, hypertension, hyperlipidemia, peripheral neuropathy, seizures, alcohol abuse, prostate cancer status post radioactive seed implant presenting to the hospital via EMS from his nursing home for evaluation of bilateral lower extremity edema extending up to his abdomen.  Patient is not sure why he is here.  He has no complaints.  Denies shortness of breath, orthopnea, or lower extremity edema.  Denies chest pain.  States his appetite is good.  No additional history could be obtained from him.  Pt admitted with anasarca, volume overload, and cirrhosis.   10/22- s/p US guided LLQ paracentesis (2L clear yellow fluid removed)  Reviewed I/O's: -1.3 L x 24 hours and -2 L since admission  UOP: 1.6 L x 24 hours  RD consulted for poor oral intake. Per meal completion documentation, pt consuming 5-15% of meals. Noted two styrofoam cups of beverages on pt's tray table, however, less than 25% consumed from each cup.   Spoke with with pt at bedside, who was not very conversant at time of  visit. He responded to some close-ended questions. He reports he has a good appetite and that he ate lunch, but does not remember what he ate. Pt was able to tell me it was his birthday a few days ago and that he is currently in the hospital. Pt was very rigid at time of visit and did not move hands or arms during visit.   Reviewed wt hx; no wt loss over the past year. However, suspect edema may be masking true weight loss and possible fat and muscle depletions.   Lab Results  Component Value Date   HGBA1C 6.6 (H) 10/13/2018   PTA DM medications are none. Per ADA's Standards of Medical Care for Diabetes, glycemic targets for elderly patients who are otherwise healthy (few medical impairments) and cognitively intact should be less stringent (Hgb A1c <7.5).   Labs reviewed: CBGS: CBGS: 87-98 (inpatient orders for glycemic control are 0-5 units insulin aspart q HS, 0-9 units insulin aspart TID with meals).   NUTRITION - FOCUSED PHYSICAL EXAM:    Most Recent Value  Orbital Region  No depletion  Upper Arm Region  No depletion  Thoracic and Lumbar Region  No depletion  Buccal Region  No depletion  Temple Region  No depletion  Clavicle Bone Region  No depletion  Clavicle and Acromion Bone Region  No depletion  Scapular Bone Region  No depletion  Dorsal Hand  Mild depletion  Patellar Region  No depletion  Anterior Thigh Region  No depletion  Posterior Calf Region  No depletion  Edema (RD Assessment)  Mild  Hair  Reviewed  Eyes  Reviewed  Mouth  Reviewed  Skin  Reviewed  Nails  Reviewed       Diet Order:   Diet Order            DIET DYS 3 Room service appropriate? No; Fluid consistency: Thin; Fluid restriction: 1200 mL Fluid  Diet effective now              EDUCATION NEEDS:   Not appropriate for education at this time  Skin:  Skin Assessment: Skin Integrity Issues: Skin Integrity Issues:: Other (Comment) Other: MASD to buttocks  Last BM:  01/12/19  Height:   Ht  Readings from Last 1 Encounters:  10/11/18 5' 8.5" (1.74 m)    Weight:   Wt Readings from Last 1 Encounters:  01/13/19 86.8 kg    Ideal Body Weight:  71.4 kg  BMI:  Body mass index is 28.67 kg/m.  Estimated Nutritional Needs:   Kcal:  1900-2100  Protein:  90-105 grams  Fluid:  1.2 L    Joy Haegele A. Jimmye Norman, RD, LDN, [place] Registered Dietitian II Certified Diabetes Care and Education Specialist Pager: [phone] After hours Pager: [phone]

## 2019-01-13 NOTE — Evaluation (Signed)
Occupational Therapy Evaluation Patient Details Name: Daniel [name] MRN: [id number] DOB: 1933-06-15 Today's Date: 01/13/2019    History of Present Illness Pt is an 85 y/o male admitted secondary to increased LE edema likely from related to decompensated liver cirrhosis. PMH includes alcohol dependence, DM, liver cirrhosis, CAD s/p stent placement, CKD, HTN, and prostate cancer.    Clinical Impression   PTA Pt at ALF getting assist with transfers, bathing/dressing and toilet needs addressed by ALF staff. Pt was self-feeding and completing grooming tasks. Today Pt presents with decreased cognition, increased need of assist for bed mobility and transfers, unable to take pivotal steps at this time for toilet transfer or transfer to [facility]. Son present throughout session and able to share PLOF as well as assist in goal setting. Pt will require SNF level care prior to return to ALF post-acute. OT will follow acutely with next session to focus on transfer OOB (potentially lateral scoot - drop arm recliner in room), as well as engage in grooming tasks more (min A today for face washing)    Follow Up Recommendations  SNF;Supervision/Assistance - 24 hour    Equipment Recommendations  Other (comment)(defer to next venue of care)    Recommendations for Other Services       Precautions / Restrictions Precautions Precautions: Fall Restrictions Weight Bearing Restrictions: No      Mobility Bed Mobility Overal bed mobility: Needs Assistance Bed Mobility: Supine to Sit;Sit to Supine;Rolling Rolling: Mod assist   Supine to sit: Mod assist;+2 for physical assistance Sit to supine: Total assist;+2 for physical assistance   General bed mobility comments: Mod A for rolling for clean up following BM. Mod A +2 for LE assist and trunk assist to come to sitting. increased time required. Total A +2 to return to supine.   Transfers Overall transfer level: Needs assistance Equipment used: Rolling walker  (2 wheeled) Transfers: Sit to/from Stand Sit to Stand: Max assist;+2 physical assistance         General transfer comment: Max +2 to stand at EOB. Pt with posterior lean upon standing. Was unable to lift LEs to take steps at EOB.     Balance Overall balance assessment: Needs assistance Sitting-balance support: No upper extremity supported;Feet supported Sitting balance-Leahy Scale: Fair   Postural control: Posterior lean Standing balance support: Bilateral upper extremity supported Standing balance-Leahy Scale: Poor Standing balance comment: Reliant on BUE support and external support for balance.                            ADL either performed or assessed with clinical judgement   ADL Overall ADL's : Needs assistance/impaired Eating/Feeding: Moderate assistance;Bed level   Grooming: Minimal assistance;Bed level;Wash/dry hands;Wash/dry face Grooming Details (indicate cue type and reason): cues for task completion Upper Body Bathing: Moderate assistance   Lower Body Bathing: Total assistance   Upper Body Dressing : Moderate assistance   Lower Body Dressing: Total assistance   Toilet Transfer: Maximal assistance;+2 for physical assistance;+2 for safety/equipment   Toileting- Clothing Manipulation and Hygiene: Total assistance Toileting - Clothing Manipulation Details (indicate cue type and reason): bed level clean up of BM       General ADL Comments: increased need for assistance with transfers and increased confusion, pleasantly cooperative throughout session     Vision         Perception     Praxis      Pertinent Vitals/Pain Pain Assessment: No/denies pain  Hand Dominance Right   Extremity/Trunk Assessment Upper Extremity Assessment Upper Extremity Assessment: Defer to OT evaluation   Lower Extremity Assessment Lower Extremity Assessment: Generalized weakness;RLE deficits/detail;LLE deficits/detail RLE Deficits / Details: Able to perform  heel slide and ankle pump with assist. Blister noted on shin.  LLE Deficits / Details: Decreased DF secondary to swelling and PF tightness. Able to perform heel slide with assist.    Cervical / Trunk Assessment Cervical / Trunk Assessment: Kyphotic   Communication Communication Communication: No difficulties   Cognition Arousal/Alertness: Awake/alert Behavior During Therapy: WFL for tasks assessed/performed Overall Cognitive Status: History of cognitive impairments - at baseline                                 General Comments: Per son, pt has intermittent confusion and cognitive deficits at baseline.    General Comments  Pt's son present during session. Answered questions about SNF recommendations.     Exercises     Shoulder Instructions      Home Living Family/patient expects to be discharged to:: Skilled nursing facility                                        Prior Functioning/Environment Level of Independence: Needs assistance  Gait / Transfers Assistance Needed: Per son, pt was working with therapy on walking at ALF, but mainly used WC for mobility.  ADL's / Homemaking Assistance Needed: Son reports staff assisted with transfers and ADL (bathing/dressing)             OT Problem List: Decreased strength;Decreased range of motion;Decreased activity tolerance;Impaired balance (sitting and/or standing);Decreased cognition;Decreased safety awareness      OT Treatment/Interventions: Self-care/ADL training;Therapeutic exercise;DME and/or AE instruction;Therapeutic activities;Patient/family education;Balance training    OT Goals(Current goals can be found in the care plan section) Acute Rehab OT Goals Patient Stated Goal: Per son, for pt to return back to ALF following rehab OT Goal Formulation: With family Time For Goal Achievement: 01/27/19 Potential to Achieve Goals: Good ADL Goals Pt Will Perform Grooming: with set-up;sitting Pt Will  Perform Upper Body Bathing: with set-up;sitting Pt Will Perform Upper Body Dressing: with set-up;sitting Pt Will Transfer to Toilet: with mod assist;stand pivot transfer;bedside commode Pt Will Perform Toileting - Clothing Manipulation and hygiene: with mod assist;sitting/lateral leans Additional ADL Goal #1: Pt will perform bed mobility at min A prior to engaging in ADL  OT Frequency: Min 2X/week   Barriers to D/C:    lives in ALF, currently needs SNF level care       Co-evaluation PT/OT/SLP Co-Evaluation/Treatment: Yes Reason for Co-Treatment: Necessary to address cognition/behavior during functional activity;For patient/therapist safety;To address functional/ADL transfers PT goals addressed during session: Mobility/safety with mobility;Balance OT goals addressed during session: ADL's and self-care;Strengthening/ROM      AM-PAC OT "6 Clicks" Daily Activity     Outcome Measure Help from another person eating meals?: A Little Help from another person taking care of personal grooming?: A Little Help from another person toileting, which includes using toliet, bedpan, or urinal?: A Lot Help from another person bathing (including washing, rinsing, drying)?: A Lot Help from another person to put on and taking off regular upper body clothing?: A Lot Help from another person to put on and taking off regular lower body clothing?: Total 6 Click Score: 13   End  of Session Equipment Utilized During Treatment: Gait belt;Rolling walker Nurse Communication: Mobility status  Activity Tolerance: Patient tolerated treatment well Patient left: in bed;with call bell/phone within reach;with bed alarm set;with family/visitor present  OT Visit Diagnosis: Unsteadiness on feet (R26.81);Other abnormalities of gait and mobility (R26.89);Repeated falls (R29.6);Muscle weakness (generalized) (M62.81);History of falling (Z91.81);Other symptoms and signs involving cognitive function                Time:  4944-9675 OT Time Calculation (min): 44 min Charges:  OT General Charges $OT Visit: 1 Visit OT Evaluation $OT Eval Moderate Complexity: [place] OTR/L Acute Rehabilitation Services Pager: [phone] Office: [place] 01/13/2019, 3:44 PM

## 2019-01-13 NOTE — NC FL2 (Addendum)
[place] MEDICAID FL2 LEVEL OF CARE SCREENING TOOL     IDENTIFICATION  Patient Name: Daniel [name] Birthdate: September 16, 1933 Sex: male Admission Date (Current Location): 01/10/2019  [facility] and [place] Number:  Herbalist and Address:  [facility] [place]. [facility] [place] [address], [place], [place] [address]      Provider Number: [id number]  Attending Physician Name and Address:  Elodia Florence., *  Relative Name and Phone Number:  Castle Lamons [phone]    Current Level of Care: Hospital Recommended Level of Care: ALF Prior Approval Number:    Date Approved/Denied:   PASRR Number: [id number]  Discharge Plan: ALF    Current Diagnoses: Patient Active Problem List   Diagnosis Date Noted  . AKI (acute kidney injury) ([place]) 01/11/2019  . Bradycardia 01/11/2019  . Edema 01/10/2019  . Evaluation by psychiatric service required   . Acute metabolic encephalopathy 75/12/2583  . Gait instability 10/12/2018  . HLD (hyperlipidemia) 10/12/2018  . Elevated troponin 10/12/2018  . Hypoglycemia 07/19/2017  . Hypotension 07/19/2017  . Normochromic normocytic anemia 07/19/2017  . Thrombocytopenia ([place]) 07/19/2017  . CKD (chronic kidney disease), stage III 07/19/2017  . Seizure ([place]) 06/16/2017  . Type II diabetes mellitus with neurological manifestations ([place]) 03/05/2015  . Porokeratosis 03/05/2015  . Chronic systolic CHF (congestive heart failure) ([place]) 01/28/2015  . Coronary artery disease 01/28/2015  . Bruit of left carotid artery 01/28/2015  . 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 H/O detached retina repair 08/23/2014  . Acute on chronic renal failure ([place]) 04/17/2013  . Dehydration 04/17/2013  . Alcohol dependence ([place]) 04/17/2013  . Transaminasemia 04/17/2013  . Type II diabetes mellitus with renal manifestations ([place]) 04/17/2013  . Near syncope 04/17/2013  . UTI (lower urinary tract infection) 04/17/2013  . Hematuria  04/17/2013    Orientation RESPIRATION BLADDER Height & Weight     Self  Normal Incontinent Weight: 86.8 kg Height:     BEHAVIORAL SYMPTOMS/MOOD NEUROLOGICAL BOWEL NUTRITION STATUS      Incontinent Diet(See DC Summary)  AMBULATORY STATUS COMMUNICATION OF NEEDS Skin   Extensive Assist Verbally Normal                       Personal Care Assistance Level of Assistance  Bathing, Feeding, Dressing, Total care Bathing Assistance: Maximum assistance Feeding assistance: Maximum assistance Dressing Assistance: Maximum assistance Total Care Assistance: Maximum assistance   Functional Limitations Info  Sight, Hearing, Speech Sight Info: Adequate Hearing Info: Adequate Speech Info: Adequate    SPECIAL CARE FACTORS FREQUENCY  PT (By licensed PT), OT (By licensed OT)     PT Frequency: 5x/week OT Frequency: 5x/week            Contractures Contractures Info: Not present    Additional Factors Info  Code Status, Allergies Code Status Info: DNR Allergies Info: NKA           Current Medications (01/13/2019):  This is the current hospital active medication list Current Facility-Administered Medications  Medication Dose Route Frequency Provider Last Rate Last Dose  . acetaminophen (TYLENOL) tablet 650 mg  650 mg Oral Q6H PRN Shela Leff, MD       Or  . acetaminophen (TYLENOL) suppository 650 mg  650 mg Rectal Q6H PRN Shela Leff, MD      . folic acid (FOLVITE) tablet 1 mg  1 mg Oral Daily Shela Leff, MD   1 mg at 01/13/19 0830  .  furosemide (LASIX) injection 40 mg  40 mg Intravenous Q6H Corliss Parish, MD   40 mg at 01/13/19 0829  . heparin injection 5,000 Units  5,000 Units Subcutaneous Q8H Shela Leff, MD   5,000 Units at 01/13/19 [phone]  . insulin aspart (novoLOG) injection 0-5 Units  0-5 Units Subcutaneous QHS Shela Leff, MD      . insulin aspart (novoLOG) injection 0-9 Units  0-9 Units Subcutaneous TID WC Shela Leff, MD    1 Units at 01/11/19 1722  . lidocaine (PF) (XYLOCAINE) 1 % injection    PRN Ascencion Dike, PA-C   10 mL at 01/12/19 1400  . LORazepam (ATIVAN) tablet 1-4 mg  1-4 mg Oral Q1H PRN Shela Leff, MD       Or  . LORazepam (ATIVAN) injection 1-4 mg  1-4 mg Intravenous Q1H PRN Shela Leff, MD      . midodrine (PROAMATINE) tablet 10 mg  10 mg Oral TID WC Corliss Parish, MD   10 mg at 01/13/19 0762  . multivitamin with minerals tablet 1 tablet  1 tablet Oral Daily Shela Leff, MD   1 tablet at 01/13/19 0829  . potassium chloride SA (KLOR-CON) CR tablet 40 mEq  40 mEq Oral Daily Corliss Parish, MD   40 mEq at 01/13/19 0829  . sodium bicarbonate tablet 650 mg  650 mg Oral BID Corliss Parish, MD   650 mg at 01/13/19 0829  . thiamine (VITAMIN B-1) tablet 100 mg  100 mg Oral Daily Shela Leff, MD   100 mg at 01/13/19 0830     Discharge Medications: Please see discharge summary for a list of discharge medications.  Relevant Imaging Results:  Relevant Lab Results:   Additional Information [address], [place]

## 2019-01-13 NOTE — Progress Notes (Signed)
Patient with minimal urine output throughout the day.  RN notified [facility] on call and asked about in/out cath given patient history of prostate cancer and radioactive seed implant.

## 2019-01-13 NOTE — Progress Notes (Addendum)
PROGRESS NOTE    Daniel [name]  ELF:[id number] DOB: 06-15-1933 DOA: 01/10/2019 PCP: Jani Gravel, MD   Brief Narrative:  Daniel [name] is Daniel [name] 85 y.o. male with medical history significant of liver cirrhosis, alcohol dependence, CHF, CAD status post stent placement, type 2 diabetes, CKD stage III, hypertension, hyperlipidemia, peripheral neuropathy, seizures, alcohol abuse, prostate cancer status post radioactive seed implant presenting to the hospital via EMS from his nursing home for evaluation of bilateral lower extremity edema extending up to his abdomen.  Patient is not sure why he is here.  He has no complaints.  Denies shortness of breath, orthopnea, or lower extremity edema.  Denies chest pain.  States his appetite is good.  No additional history could be obtained from him.  ED Course: Bradycardic with heart rate ranging from the 40s to 60s.  Not hypotensive.  Not tachypneic or hypoxic.  No leukocytosis.  Bicarb 17, anion gap 12.  Bicarb mildly low on labs done Daniel [name] month ago as well.  BUN 80, creatinine 3.6.  Baseline creatinine 1.3.  LFTs elevated (AST 117, ALT 69, alk phos 373).  LFTs were elevated on labs done Daniel [name] month ago as well.  High-sensitivity troponin 55.  EKG without acute ischemic changes.  BNP 181.  SARS-CoV-2 test pending. Patient received IV Lasix 40 mg.  Assessment & Plan:   Principal Problem:   Edema Active Problems:   Alcohol dependence (HCC)   Type II diabetes mellitus with renal manifestations (HCC)   AKI (acute kidney injury) (HCC)   Bradycardia  Anasarca   Volume Overload   Cirrhosis: Patient has significant bilateral lower extremity edema extending up to the abdomen.  Does have Daniel [name] history of CHF and last echo done in July 2020 with LVEF 45 to 50% and left ventricular diastolic parameters consistent with pseudonormalization.  However, BNP not significantly elevated.  Chest x-ray showing moderate left pleural effusion but no overt pulmonary edema.  Patient has no  complaints of dyspnea or orthopnea.  Not tachypneic or hypoxic.  Suspect his edema is related to decompensated liver cirrhosis.  Albumin is low at 2.1. Ultrasound with evidence of hepatic cirrhosis and moderate volume ascites.  -Appreciate renal assistance - attempting midodrine and IV lasix for diuresis  -paracentesis 10/22 with 2 L off - no evidence of SBP - follow cx (NGTD) -unable to start spironolactone right now due to AKI  -consider therapeutic paracentesis +/- thoracentesis as well pending course -Monitor intake and output, daily weights, low-sodium diet with fluid restriction  AKI on CKD 3   NAGMA BUN 80, creatinine 3.6.  Baseline creatinine 1.3.  AKI likely related to volume overload, intravascularly dry with hypoalbuminemia, possibly related to diuresis as well (apparently per son, he was being managed at nursing facility for volume overload and may have been getting higher doses of lasix?).  Hepatorenal should be on differential as well with his cirrhosis.  -creatinine worsened today to 3.9 -IV Lasix for diuresis as above, give albumin with this this PM - Appreciate renal assistance -> diuresing with midodrine  - urine is bland, follow urine lytes (urine Na not suggestive of HRS) - bicarb - I/O, daily weights - Monitor urine output - Avoid nephrotoxic agents/contrast  Sinus bradycardia Bradycardic with heart rate ranging from the 40s to 60s.  Not hypotensive.  Asymptomatic.  Not on any AV nodal blocking agent. -Check TSH, free T4 levels (T4 elevated) - mildly abnormal - follow repeat outpatient  -Cardiac monitoring  Normal anion gap metabolic acidosis  Likely related to AKI. -Continue to monitor  Mildly elevated troponin High-sensitivity troponin 55 >49.  Troponin previously mildly elevated as well.  EKG without acute ischemic changes.  Patient has no complaints of chest pain and appears comfortable. -Cardiac monitoring  Insulin-dependent diabetes mellitus A1c 6.6 on  7/23. -Sliding scale insulin sensitive and CBG checks - BG's reasonable   History of alcohol dependence Patient is currently living in [name] home.  No signs of withdrawal. -CIWA protocol; Ativan as needed -Thiamine, folate, multivitamin  Anemia: continue to monitor, no si/sx of bleeding.  Stable, follow  Poor PO intake: will c/s dietitian   needs peer to peer for SNF placement - deadline is Monday 12 noon,  the number is [phone] opt 5  DVT prophylaxis: heparin  Code Status: DNR Family Communication: called son, no answer on 10/22 or 10/23 Disposition Plan: pending further improvement  Consultants:   IR  Nephrology  Procedures:   IR paracentesis 10/22  Antimicrobials:  Anti-infectives (From admission, onward)   None     Subjective: Denies pain or discomfort today Speaking Ayuub [name] little more  Objective: Vitals:   01/12/19 2025 01/13/19 0045 01/13/19 0617 01/13/19 0712  BP: (!) 123/52 (!) 113/48 (!) 126/43   Pulse: 68 71 74   Resp: _0 Temp: 97.8 F (36.6 C) 97.9 F (36.6 C) 97.8 F (36.6 C)   TempSrc: Oral Oral Oral   SpO2: 100% 98% 97%   Weight:    86.8 kg    Intake/Output Summary (Last 24 hours) at 01/13/2019 1302 Last data filed at 01/13/2019 0940 Gross per 24 hour  Intake 240 ml  Output 1825 ml  Net -1585 ml   Filed Weights   01/11/19 0932 01/12/19 0702 01/13/19 0712  Weight: 92.3 kg 92.3 kg 86.8 kg    Examination:  General: No acute distress. Cardiovascular: Heart sounds show Daniel [name] regular rate, and rhythm. Lungs: Clear to auscultation bilaterally  Abdomen: Soft, nontender, distended Neurological: Alert, but disoriented, speaking more today. Moves all extremities 4. Cranial nerves II through XII grossly intact. Skin: Warm and dry. No rashes or lesions. Extremities: bilateral LE edema      Data Reviewed: I have personally reviewed following labs and imaging studies  CBC: Recent Labs  Lab 01/10/19 2132 01/12/19 0418  01/12/19 2024 01/13/19 0439  WBC 7.9 7.5  --  8.2  NEUTROABS 4.5  --   --   --   HGB 12.4* 10.4* 11.2* 10.7*  HCT 37.5* 29.6* 31.8* 30.7*  MCV 99.5 94.3  --  95.3  PLT 205 150  --  850   Basic Metabolic Panel: Recent Labs  Lab 01/11/19 0344 01/11/19 1046 01/11/19 1600 01/12/19 0418 01/13/19 0439  NA 136 135 135 136 137  K 4.0 4.0 4.1 3.8 4.1  CL 110 108 106 109 108  CO2 15* 18* 17* 15* 16*  GLUCOSE 129* 102* 129* 89 95  BUN 80* 81* 79* 82* 84*  CREATININE 3.63* 3.58* 3.79* 3.82* 3.90*  CALCIUM 8.4* 8.5* 8.5* 8.4* 8.7*  MG  --   --   --  2.3 2.3  PHOS  --   --   --   --  5.0*   GFR: Estimated Creatinine Clearance: 15.5 mL/min ([place]) (by C-G formula based on SCr of 3.9 mg/dL (H)). Liver Function Tests: Recent Labs  Lab 01/10/19 2132 01/11/19 0344 01/12/19 0418 01/13/19 0439  AST 117* 94* 85* 82*  ALT 69* 56* 53* 51*  ALKPHOS  373* 304* 313* 290*  BILITOT 1.2 1.0 1.1 1.5*  PROT 6.0* 4.9* 5.0* 5.1*  ALBUMIN 2.6* 2.1* 2.4* 2.4*   No results for input(s): LIPASE, AMYLASE in the last 168 hours. No results for input(s): AMMONIA in the last 168 hours. Coagulation Profile: Recent Labs  Lab 01/10/19 2132  INR 1.2   Cardiac Enzymes: No results for input(s): CKTOTAL, CKMB, CKMBINDEX, TROPONINI in the last 168 hours. BNP (last 3 results) No results for input(s): PROBNP in the last 8760 hours. HbA1C: No results for input(s): HGBA1C in the last 72 hours. CBG: Recent Labs  Lab 01/12/19 1107 01/12/19 1622 01/12/19 2141 01/13/19 0638 01/13/19 1111  GLUCAP 106* 91 87 98 88   Lipid Profile: No results for input(s): CHOL, HDL, LDLCALC, TRIG, CHOLHDL, LDLDIRECT in the last 72 hours. Thyroid Function Tests: Recent Labs    01/11/19 0114  TSH 2.240  FREET4 1.37*   Anemia Panel: No results for input(s): VITAMINB12, FOLATE, FERRITIN, TIBC, IRON, RETICCTPCT in the last 72 hours. Sepsis Labs: No results for input(s): PROCALCITON, LATICACIDVEN in the last 168  hours.  Recent Results (from the past 240 hour(s))  SARS CORONAVIRUS 2 (TAT 6-24 HRS) Nasopharyngeal Nasopharyngeal Swab     Status: None   Collection Time: 01/10/19 11:35 PM   Specimen: Nasopharyngeal Swab  Result Value Ref Range Status   SARS Coronavirus 2 NEGATIVE NEGATIVE Final    Comment: (NOTE) SARS-CoV-2 target nucleic acids are NOT DETECTED. The SARS-CoV-2 RNA is generally detectable in upper and lower respiratory specimens during the acute phase of infection. Negative results do not preclude SARS-CoV-2 infection, do not rule out co-infections with other pathogens, and should not be used as the sole basis for treatment or other patient management decisions. Negative results must be combined with clinical observations, patient history, and epidemiological information. The expected result is Negative. Fact Sheet for Patients: SugarRoll.be Fact Sheet for Healthcare Providers: https://www.woods-mathews.com/ This test is not yet approved or cleared by the [place] FDA and  has been authorized for detection and/or diagnosis of SARS-CoV-2 by FDA under an Emergency Use Authorization (EUA). This EUA will remain  in effect (meaning this test can be used) for the duration of the COVID-19 declaration under Section 56 4(b)(1) of the Act, 21 U.S.C. section 360bbb-3(b)(1), unless the authorization is terminated or revoked sooner. Performed at [facility], [place] [address] [place], [place] [address]   Gram stain     Status: None   Collection Time: 01/12/19  2:24 PM   Specimen: Abdomen; Peritoneal Fluid  Result Value Ref Range Status   Specimen Description PERITONEAL  Final   Special Requests NONE  Final   Gram Stain   Final    ABUNDANT WBC PRESENT, PREDOMINANTLY MONONUCLEAR NO ORGANISMS SEEN Performed at [facility], [place] [address], [place], [place] [address]    Report Status 01/12/2019 FINAL  Final  Culture, body  fluid-bottle     Status: None (Preliminary result)   Collection Time: 01/12/19  2:24 PM   Specimen: Peritoneal Washings  Result Value Ref Range Status   Specimen Description PERITONEAL  Final   Special Requests NONE  Final   Culture   Final    NO GROWTH < 24 HOURS Performed at [facility], [place] [address], [place], [place] [address]    Report Status PENDING  Incomplete         Radiology Studies: Ir Paracentesis  Result Date: 01/12/2019 INDICATION: Abdominal distention. Acute renal insufficiency. Ascites.  Request for diagnostic and therapeutic paracentesis with max 2 L. EXAM: ULTRASOUND GUIDED LEFT LOWER QUADRANT PARACENTESIS MEDICATIONS: None. COMPLICATIONS: None immediate. PROCEDURE: Informed written consent was obtained from the patient after Mekiyah Gladwell discussion of the risks, benefits and alternatives to treatment. Arthea Nobel timeout was performed prior to the initiation of the procedure. Initial ultrasound scanning demonstrates Kirsten Mckone large amount of ascites within the left lower abdominal quadrant. The left lower abdomen was prepped and draped in the usual sterile fashion. 1% lidocaine was used for local anesthesia. Following this, Lillyanne Bradburn 19 gauge, 7-cm, Yueh catheter was introduced. An ultrasound image was saved for documentation purposes. The paracentesis was performed. The catheter was removed and Aalliyah Kilker dressing was applied. The patient tolerated the procedure well without immediate post procedural complication. FINDINGS: Jayanna Kroeger total of approximately 2 L of clear yellow fluid was removed. Samples were sent to the laboratory as requested by the clinical team. IMPRESSION: Successful ultrasound-guided paracentesis yielding 2 liters of peritoneal fluid. Read by: Ascencion Dike PA-C Electronically Signed   By: Jerilynn Mages.  Shick M.D.   On: 01/12/2019 14:09        Scheduled Meds:  folic acid  1 mg Oral Daily   furosemide  40 mg Intravenous Q6H   heparin  5,000 Units Subcutaneous Q8H   insulin aspart  0-5 Units  Subcutaneous QHS   insulin aspart  0-9 Units Subcutaneous TID WC   midodrine  10 mg Oral TID WC   multivitamin with minerals  1 tablet Oral Daily   potassium chloride  40 mEq Oral Daily   sodium bicarbonate  650 mg Oral BID   thiamine  100 mg Oral Daily   Continuous Infusions:    LOS: 3 days    Time spent: over 30 min    Fayrene Helper, MD [facility] Pager AMION  If 7PM-7AM, please contact night-coverage [email or web address] Password [email or web address] 01/13/2019, 1:02 PM

## 2019-01-13 NOTE — TOC Progression Note (Addendum)
Transition of Care [facility]) - Progression Note    Patient Details  Name: Daniel [name] MRN: [id number] Date of Birth: December 03, 1933  [facility] CM/SW Contact  Zenon Mayo, RN Phone Number: 01/13/2019, 4:16 PM  Clinical Narrative:    NCM received call from [facility] stating , referral for SNF had to go to Medical Director for review, he would like to do Peer to Peer with MD , the deadline is Monday at 12 noon, NCM informed Dr. Florene Glen of this information.  To call [phone] opt 5.  Reason for peer to peer because patient is close to functional baseline, with no functional decline.   NCM informed son , [name]  [phone], that MD will be doing Peer to peer and we will probably not know anything til Monday.  Please keep [name] updated.   Expected Discharge Plan: Skilled Nursing Facility Barriers to Discharge: No Barriers Identified  Expected Discharge Plan and Services Expected Discharge Plan: [place] In-house Referral: NA Discharge Planning Services: CM Consult   Living arrangements for the past 2 months: Assisted Living Facility                 DME Arranged: (NA)         HH Arranged: NA           Social Determinants of Health (SDOH) Interventions    Readmission Risk Interventions Readmission Risk Prevention Plan 01/12/2019  Transportation Screening Complete  HRI or [place] Complete  Social Work Consult for [place] Planning/Counseling Complete  Palliative Care Screening Not Applicable  Medication Review [employer] photographer) Complete  Some recent data might be hidden

## 2019-01-13 NOTE — Progress Notes (Signed)
[facility]    NEPHROLOGY PROGRESS NOTE  SUBJECTIVE: Patient awake on exam, confused, unable to provide any other associated history.     OBJECTIVE:  Vitals:   01/13/19 0045 01/13/19 0617  BP: (!) 113/48 (!) 126/43  Pulse: 71 74  Resp: 20 18  Temp: 97.9 F (36.6 C) 97.8 F (36.6 C)  SpO2: 98% 97%    Intake/Output Summary (Last 24 hours) at 01/13/2019 1429 Last data filed at 01/13/2019 0940 Gross per 24 hour  Intake 240 ml  Output 1550 ml  Net -1310 ml      General: Awake, not alert to self, no acute distress HEENT: MMM [place] AT anicteric sclera Neck:  No JVD, no adenopathy CV:  Heart RRR  Lungs:  L/S CTA bilaterally Abd:  abd distended with a fluid wave with normal BS GU:  Bladder non-palpable Extremities: +2 bilateral lower extremity edema Skin:  No skin rash  MEDICATIONS:  . feeding supplement (PRO-STAT SUGAR FREE 64)  30 mL Oral TID BM  . folic acid  1 mg Oral Daily  . furosemide  40 mg Intravenous Q6H  . heparin  5,000 Units Subcutaneous Q8H  . insulin aspart  0-5 Units Subcutaneous QHS  . insulin aspart  0-9 Units Subcutaneous TID WC  . midodrine  10 mg Oral TID WC  . multivitamin with minerals  1 tablet Oral Daily  . potassium chloride  40 mEq Oral Daily  . sodium bicarbonate  650 mg Oral BID  . thiamine  100 mg Oral Daily       LABS:   CBC Latest Ref Rng & Units 01/13/2019 01/12/2019 01/12/2019  WBC 4.0 - 10.5 K/uL 8.2 - 7.5  Hemoglobin 13.0 - 17.0 g/dL 10.7(L) 11.2(L) 10.4(L)  Hematocrit 39.0 - 52.0 % 30.7(L) 31.8(L) 29.6(L)  Platelets 150 - 400 K/uL 164 - 150    CMP Latest Ref Rng & Units 01/13/2019 01/12/2019 01/11/2019  Glucose 70 - 99 mg/dL 95 89 129(H)  BUN 8 - 23 mg/dL 84(H) 82(H) 79(H)  Creatinine 0.61 - 1.24 mg/dL 3.90(H) 3.82(H) 3.79(H)  Sodium 135 - 145 mmol/L 137 136 135  Potassium 3.5 - 5.1 mmol/L 4.1 3.8 4.1  Chloride 98 - 111 mmol/L 108 109 106  CO2 22 - 32 mmol/L 16(L) 15(L) 17(L)  Calcium 8.9 - 10.3 mg/dL  8.7(L) 8.4(L) 8.5(L)  Total Protein 6.5 - 8.1 g/dL 5.1(L) 5.0(L) -  Total Bilirubin 0.3 - 1.2 mg/dL 1.5(H) 1.1 -  Alkaline Phos 38 - 126 U/L 290(H) 313(H) -  AST 15 - 41 U/L 82(H) 85(H) -  ALT 0 - 44 U/L 51(H) 53(H) -    Lab Results  Component Value Date   CALCIUM 8.7 (L) 01/13/2019   CAION 1.16 01/23/2018   PHOS 5.0 (H) 01/13/2019       Component Value Date/Time   COLORURINE YELLOW 01/11/2019 0538   APPEARANCEUR CLEAR 01/11/2019 0538   LABSPEC 1.010 01/11/2019 0538   PHURINE 5.0 01/11/2019 0538   GLUCOSEU NEGATIVE 01/11/2019 0538   HGBUR NEGATIVE 01/11/2019 0538   BILIRUBINUR NEGATIVE 01/11/2019 0538   KETONESUR NEGATIVE 01/11/2019 0538   PROTEINUR NEGATIVE 01/11/2019 0538   UROBILINOGEN 1.0 04/17/2013 1014   NITRITE NEGATIVE 01/11/2019 0538   LEUKOCYTESUR NEGATIVE 01/11/2019 0538      Component Value Date/Time   PHART 7.445 06/17/2017 0415   PCO2ART 27.5 (L) 06/17/2017 0415   PO2ART 154.0 (H) 06/17/2017 0415   HCO3 18.9 (L) 06/17/2017 0415   TCO2 15 (L) 01/23/2018 1430  ACIDBASEDEF 4.0 (H) 06/17/2017 0415   O2SAT 99.0 06/17/2017 0415    No results found for: IRON, TIBC, FERRITIN, IRONPCTSAT     ASSESSMENT/PLAN:    85 year old white male nursing home resident with multiple medical issues including some stage III CKD and dementia. He presents with volume overload and acute on chronic renal failure 1.Renal-acute on chronic renal failure (CKD stage III with baseline 1.4 in August of 2020)  in the setting of cirrhosis, decreased ejection fraction, and volume overload. He was on Cozaar as an outpatient and blood pressure is soft. That along with a fairly normal renal ultrasound and a bland urinalysis suggests that this acute kidney injury has been hemodynamically mediated. Either by cardiorenal syndrome, hepatorenal syndrome or just hypotension on an ARB. ARB has been held. He is no longer on any antihypertensives. Urine sodium not consistent with hepatorenal. Not a  candidate for dialysis therapy given his chronic debilitated state, age and presumed dementia but there are no indications for dialysis today.  Continue diuresis with furosemide at reduced dose and midodrine. 2. Hypertension/volume-volume overloaded with a soft blood pressure. All antihypertensives have been stopped. attempting midodrine and IV Lasix for diuresis- will continue current dosing - maybe some progress 3.Electrolytes-potassium a little low, will attempt to replete as is diuresis is successful it will go down more  4.Metabolic acidosis-in the setting of acute kidney injury. Will increase sodium bicarb  4. Anemia-is not significant at this time nor does it require treatment at this time     [employer], DO, FACP

## 2019-01-13 NOTE — Care Management Important Message (Signed)
Important Message  Patient Details  Name: Daniel [name] MRN: [id number] Date of Birth: 07/04/1933   Medicare Important Message Given:  Yes     [name] 01/13/2019, 11:22 AM

## 2019-01-14 DIAGNOSIS — R609 Edema, unspecified: Secondary | ICD-10-CM | POA: Diagnosis not present

## 2019-01-14 LAB — GLUCOSE, CAPILLARY
Glucose-Capillary: 132 mg/dL — ABNORMAL HIGH (ref 70–99)
Glucose-Capillary: 125 mg/dL — ABNORMAL HIGH (ref 70–99)
Glucose-Capillary: 127 mg/dL — ABNORMAL HIGH (ref 70–99)
Glucose-Capillary: 104 mg/dL — ABNORMAL HIGH (ref 70–99)

## 2019-01-14 LAB — RENAL FUNCTION PANEL
Albumin: 2.3 g/dL — ABNORMAL LOW (ref 3.5–5.0)
Anion gap: 11 (ref 5–15)
BUN: 89 mg/dL — ABNORMAL HIGH (ref 8–23)
CO2: 18 mmol/L — ABNORMAL LOW (ref 22–32)
Calcium: 8.7 mg/dL — ABNORMAL LOW (ref 8.9–10.3)
Chloride: 111 mmol/L (ref 98–111)
Creatinine, Ser: 4.02 mg/dL — ABNORMAL HIGH (ref 0.61–1.24)
GFR calc Af Amer: 15 mL/min — ABNORMAL LOW (ref >60)
GFR calc non Af Amer: 13 mL/min — ABNORMAL LOW (ref >60)
Glucose, Bld: 141 mg/dL — ABNORMAL HIGH (ref 70–99)
Phosphorus: 4.9 mg/dL — ABNORMAL HIGH (ref 2.5–4.6)
Potassium: 4.3 mmol/L (ref 3.5–5.1)
Sodium: 140 mmol/L (ref 135–145)

## 2019-01-14 LAB — CBC
HCT: 31.2 % — ABNORMAL LOW (ref 39.0–52.0)
Hemoglobin: 10.8 g/dL — ABNORMAL LOW (ref 13.0–17.0)
MCH: 33.2 pg (ref 26.0–34.0)
MCHC: 34.6 g/dL (ref 30.0–36.0)
MCV: 96 fL (ref 80.0–100.0)
Platelets: 146 10*3/uL — ABNORMAL LOW (ref 150–400)
RBC: 3.25 MIL/uL — ABNORMAL LOW (ref 4.22–5.81)
RDW: 14.4 % (ref 11.5–15.5)
WBC: 8.1 10*3/uL (ref 4.0–10.5)
nRBC: 0 % (ref 0.0–0.2)

## 2019-01-14 LAB — HEPATIC FUNCTION PANEL
ALT: 57 U/L — ABNORMAL HIGH (ref 0–44)
AST: 91 U/L — ABNORMAL HIGH (ref 15–41)
Albumin: 2.4 g/dL — ABNORMAL LOW (ref 3.5–5.0)
Alkaline Phosphatase: 316 U/L — ABNORMAL HIGH (ref 38–126)
Bilirubin, Direct: 0.5 mg/dL — ABNORMAL HIGH (ref 0.0–0.2)
Indirect Bilirubin: 0.7 mg/dL (ref 0.3–0.9)
Total Bilirubin: 1.2 mg/dL (ref 0.3–1.2)
Total Protein: 5.2 g/dL — ABNORMAL LOW (ref 6.5–8.1)

## 2019-01-14 LAB — MAGNESIUM: Magnesium: 2.4 mg/dL (ref 1.7–2.4)

## 2019-01-14 MED ORDER — TAMSULOSIN HCL 0.4 MG PO CAPS
0.4000 mg | ORAL_CAPSULE | Freq: Every day | ORAL | Status: DC
  Administered 2019-01-14 – 2019-01-19 (×6): 0.4 mg via ORAL
  Filled 2019-01-14 (×7): qty 1

## 2019-01-14 NOTE — Progress Notes (Addendum)
PROGRESS NOTE    Daniel [name]  PYP:[id number] DOB: 1933-05-23 DOA: 01/10/2019 PCP: Jani Gravel, MD   Brief Narrative:  Daniel [name] is Daniel [name] 85 y.o. male with medical history significant of liver cirrhosis, alcohol dependence, CHF, CAD status post stent placement, type 2 diabetes, CKD stage III, hypertension, hyperlipidemia, peripheral neuropathy, seizures, alcohol abuse, prostate cancer status post radioactive seed implant presenting to the hospital via EMS from his nursing home for evaluation of bilateral lower extremity edema extending up to his abdomen.  Patient is not sure why he is here.  He has no complaints.  Denies shortness of breath, orthopnea, or lower extremity edema.  Denies chest pain.  States his appetite is good.  No additional history could be obtained from him.  ED Course: Bradycardic with heart rate ranging from the 40s to 60s.  Not hypotensive.  Not tachypneic or hypoxic.  No leukocytosis.  Bicarb 17, anion gap 12.  Bicarb mildly low on labs done Daniel [name] month ago as well.  BUN 80, creatinine 3.6.  Baseline creatinine 1.3.  LFTs elevated (AST 117, ALT 69, alk phos 373).  LFTs were elevated on labs done Daniel [name] month ago as well.  High-sensitivity troponin 55.  EKG without acute ischemic changes.  BNP 181.  SARS-CoV-2 test pending. Patient received IV Lasix 40 mg.  Assessment & Plan:   Principal Problem:   Edema Active Problems:   Alcohol dependence (HCC)   Type II diabetes mellitus with renal manifestations (HCC)   AKI (acute kidney injury) (HCC)   Bradycardia  Anasarca   Volume Overload   Cirrhosis: Patient has significant bilateral lower extremity edema extending up to the abdomen.  Does have Daniel [name] history of CHF and last echo done in July 2020 with LVEF 45 to 50% and left ventricular diastolic parameters consistent with pseudonormalization.  However, BNP not significantly elevated.  Chest x-ray showing moderate left pleural effusion but no overt pulmonary edema.  Patient has no  complaints of dyspnea or orthopnea.  Not tachypneic or hypoxic.  Suspect his edema is related to decompensated liver cirrhosis.  Albumin is low at 2.1. Ultrasound with evidence of hepatic cirrhosis and moderate volume ascites.  -Appreciate renal assistance - attempting midodrine and IV lasix for diuresis  -paracentesis 10/22 with 2 L off - no evidence of SBP - follow cx (NGTD) -unable to start spironolactone right now due to AKI  -consider therapeutic paracentesis +/- thoracentesis as well pending course -Monitor intake and output, daily weights, low-sodium diet with fluid restriction  AKI on CKD 3   NAGMA BUN 80, creatinine 3.6.  Baseline creatinine 1.3.  AKI likely related to volume overload, intravascularly dry with hypoalbuminemia, possibly related to diuresis as well (apparently per son, he was being managed at nursing facility for volume overload and may have been getting higher doses of lasix?).  Hepatorenal should be on differential as well with his cirrhosis.  -creatinine worsened today to 4.02 - Appreciate renal assistance -> diuresing with midodrine  - urine is bland, follow urine lytes (urine Na not suggestive of HRS) - bicarb - I/O, daily weights - Monitor urine output - Avoid nephrotoxic agents/contrast  Urinary Retention?: ? Of low UOP yesterday during day and was I/O cath'd.  Difficult to use bladder scan accurately due to ascites.  Had 400 out yesterday with I/O cath.  Will need to follow closely UOP.  May need repeat I/O cath vs foley placement.  Sinus bradycardia Bradycardic with heart rate ranging from the 40s to 60s.  Not hypotensive.  Asymptomatic.  Not on any AV nodal blocking agent. -Check TSH, free T4 levels (T4 elevated) - mildly abnormal - follow repeat outpatient  -Cardiac monitoring  Normal anion gap metabolic acidosis Likely related to AKI. -Continue to monitor  Mildly elevated troponin High-sensitivity troponin 55 >49.  Troponin previously mildly  elevated as well.  EKG without acute ischemic changes.  Patient has no complaints of chest pain and appears comfortable. -Cardiac monitoring  Insulin-dependent diabetes mellitus A1c 6.6 on 7/23. -Sliding scale insulin sensitive and CBG checks - BG's reasonable   History of alcohol dependence Patient is currently living in [name] home.  No signs of withdrawal. -CIWA protocol; Ativan as needed -Thiamine, folate, multivitamin  Anemia: continue to monitor, no si/sx of bleeding.  Stable, follow  Poor PO intake: will c/s dietitian   needs peer to peer for SNF placement - deadline is Monday 12 noon,  the number is [phone] opt 5  DVT prophylaxis: heparin  Code Status: DNR Family Communication: called son, no answer on 10/22 or 10/23.  Called wife, no answer 10/24. Disposition Plan: pending further improvement  Consultants:   IR  Nephrology  Procedures:   IR paracentesis 10/22  Antimicrobials:  Anti-infectives (From admission, onward)   None     Subjective: No complaints Pleasantly comfused  Objective: Vitals:   01/14/19 0022 01/14/19 0318 01/14/19 0904 01/14/19 1154  BP: 115/69 (!) 119/50 (!) 111/44 (!) 125/44  Pulse: 78 75 76 77  Resp: '16 14  16  ' Temp: 97.8 F (36.6 C) 97.8 F (36.6 C) 98.6 F (37 C) 97.8 F (36.6 C)  TempSrc: Oral Oral Oral Oral  SpO2: 98% 96% 97% 100%  Weight:  85.2 kg      Intake/Output Summary (Last 24 hours) at 01/14/2019 1421 Last data filed at 01/14/2019 1419 Gross per 24 hour  Intake 360 ml  Output 876 ml  Net -516 ml   Filed Weights   01/12/19 0702 01/13/19 0712 01/14/19 0318  Weight: 92.3 kg 86.8 kg 85.2 kg    Examination:  General: No acute distress. Cardiovascular: Heart sounds show Daniel [name] regular rate, and rhythm.  Lungs: Clear to auscultation bilaterally  Abdomen: Soft, nontender, distended Neurological: Alert and oriented 3. Moves all extremities 4. Cranial nerves II through XII grossly intact. Skin: Warm  and dry. No rashes or lesions. Extremities: bilateral LE edema       Data Reviewed: I have personally reviewed following labs and imaging studies  CBC: Recent Labs  Lab 01/10/19 2132 01/12/19 0418 01/12/19 2024 01/13/19 0439 01/14/19 0448  WBC 7.9 7.5  --  8.2 8.1  NEUTROABS 4.5  --   --   --   --   HGB 12.4* 10.4* 11.2* 10.7* 10.8*  HCT 37.5* 29.6* 31.8* 30.7* 31.2*  MCV 99.5 94.3  --  95.3 96.0  PLT 205 150  --  164 203*   Basic Metabolic Panel: Recent Labs  Lab 01/11/19 1046 01/11/19 1600 01/12/19 0418 01/13/19 0439 01/14/19 0448  NA 135 135 136 137 140  K 4.0 4.1 3.8 4.1 4.3  CL 108 106 109 108 111  CO2 18* 17* 15* 16* 18*  GLUCOSE 102* 129* 89 95 141*  BUN 81* 79* 82* 84* 89*  CREATININE 3.58* 3.79* 3.82* 3.90* 4.02*  CALCIUM 8.5* 8.5* 8.4* 8.7* 8.7*  MG  --   --  2.3 2.3 2.4  PHOS  --   --   --  5.0* 4.9*   GFR:  Estimated Creatinine Clearance: 14.9 mL/min (Sharelle Burditt) (by C-G formula based on SCr of 4.02 mg/dL (H)). Liver Function Tests: Recent Labs  Lab 01/10/19 2132 01/11/19 0344 01/12/19 0418 01/13/19 0439 01/14/19 0448  AST 117* 94* 85* 82* 91*  ALT 69* 56* 53* 51* 57*  ALKPHOS 373* 304* 313* 290* 316*  BILITOT 1.2 1.0 1.1 1.5* 1.2  PROT 6.0* 4.9* 5.0* 5.1* 5.2*  ALBUMIN 2.6* 2.1* 2.4* 2.4* 2.4*   2.3*   No results for input(s): LIPASE, AMYLASE in the last 168 hours. No results for input(s): AMMONIA in the last 168 hours. Coagulation Profile: Recent Labs  Lab 01/10/19 2132  INR 1.2   Cardiac Enzymes: No results for input(s): CKTOTAL, CKMB, CKMBINDEX, TROPONINI in the last 168 hours. BNP (last 3 results) No results for input(s): PROBNP in the last 8760 hours. HbA1C: No results for input(s): HGBA1C in the last 72 hours. CBG: Recent Labs  Lab 01/13/19 1111 01/13/19 1623 01/13/19 2050 01/14/19 0613 01/14/19 1152  GLUCAP 88 106* 129* 132* 125*   Lipid Profile: No results for input(s): CHOL, HDL, LDLCALC, TRIG, CHOLHDL, LDLDIRECT in the  last 72 hours. Thyroid Function Tests: No results for input(s): TSH, T4TOTAL, FREET4, T3FREE, THYROIDAB in the last 72 hours. Anemia Panel: No results for input(s): VITAMINB12, FOLATE, FERRITIN, TIBC, IRON, RETICCTPCT in the last 72 hours. Sepsis Labs: No results for input(s): PROCALCITON, LATICACIDVEN in the last 168 hours.  Recent Results (from the past 240 hour(s))  SARS CORONAVIRUS 2 (TAT 6-24 HRS) Nasopharyngeal Nasopharyngeal Swab     Status: None   Collection Time: 01/10/19 11:35 PM   Specimen: Nasopharyngeal Swab  Result Value Ref Range Status   SARS Coronavirus 2 NEGATIVE NEGATIVE Final    Comment: (NOTE) SARS-CoV-2 target nucleic acids are NOT DETECTED. The SARS-CoV-2 RNA is generally detectable in upper and lower respiratory specimens during the acute phase of infection. Negative results do not preclude SARS-CoV-2 infection, do not rule out co-infections with other pathogens, and should not be used as the sole basis for treatment or other patient management decisions. Negative results must be combined with clinical observations, patient history, and epidemiological information. The expected result is Negative. Fact Sheet for Patients: SugarRoll.be Fact Sheet for Healthcare Providers: https://www.woods-mathews.com/ This test is not yet approved or cleared by the [place] FDA and  has been authorized for detection and/or diagnosis of SARS-CoV-2 by FDA under an Emergency Use Authorization (EUA). This EUA will remain  in effect (meaning this test can be used) for the duration of the COVID-19 declaration under Section 56 4(b)(1) of the Act, 21 U.S.C. section 360bbb-3(b)(1), unless the authorization is terminated or revoked sooner. Performed at [facility], [place] [address] [place], [place] [address]   Gram stain     Status: None   Collection Time: 01/12/19  2:24 PM   Specimen: Abdomen; Peritoneal Fluid  Result Value  Ref Range Status   Specimen Description PERITONEAL  Final   Special Requests NONE  Final   Gram Stain   Final    ABUNDANT WBC PRESENT, PREDOMINANTLY MONONUCLEAR NO ORGANISMS SEEN Performed at [facility], [place] [address] [place], [place] [address]    Report Status 01/12/2019 FINAL  Final  Culture, body fluid-bottle     Status: None (Preliminary result)   Collection Time: 01/12/19  2:24 PM   Specimen: Peritoneal Washings  Result Value Ref Range Status   Specimen Description PERITONEAL  Final   Special Requests NONE  Final   Culture  Final    NO GROWTH 2 DAYS Performed at [facility], [place] [address] [place],  [address]    Report Status PENDING  Incomplete         Radiology Studies: Ir Paracentesis  Result Date: 01/12/2019 INDICATION: Abdominal distention. Acute renal insufficiency. Ascites. Request for diagnostic and therapeutic paracentesis with max 2 L. EXAM: ULTRASOUND GUIDED LEFT LOWER QUADRANT PARACENTESIS MEDICATIONS: None. COMPLICATIONS: None immediate. PROCEDURE: Informed written consent was obtained from the patient after Glendell Schlottman discussion of the risks, benefits and alternatives to treatment. Gleason Ardoin timeout was performed prior to the initiation of the procedure. Initial ultrasound scanning demonstrates Rejeana Fadness large amount of ascites within the left lower abdominal quadrant. The left lower abdomen was prepped and draped in the usual sterile fashion. 1% lidocaine was used for local anesthesia. Following this, Mikea Quadros 19 gauge, 7-cm, Yueh catheter was introduced. An ultrasound image was saved for documentation purposes. The paracentesis was performed. The catheter was removed and Juna Caban dressing was applied. The patient tolerated the procedure well without immediate post procedural complication. FINDINGS: Khaleem Burchill total of approximately 2 L of clear yellow fluid was removed. Samples were sent to the laboratory as requested by the clinical team. IMPRESSION: Successful ultrasound-guided  paracentesis yielding 2 liters of peritoneal fluid. Read by: Ascencion Dike PA-C Electronically Signed   By: Jerilynn Mages.  Shick M.D.   On: 01/12/2019 14:09        Scheduled Meds:  feeding supplement (PRO-STAT SUGAR FREE 64)  30 mL Oral TID BM   folic acid  1 mg Oral Daily   furosemide  40 mg Intravenous Q12H   heparin  5,000 Units Subcutaneous Q8H   insulin aspart  0-5 Units Subcutaneous QHS   insulin aspart  0-9 Units Subcutaneous TID WC   midodrine  10 mg Oral TID WC   multivitamin with minerals  1 tablet Oral Daily   potassium chloride  40 mEq Oral Daily   sodium bicarbonate  1,300 mg Oral BID   thiamine  100 mg Oral Daily   Continuous Infusions:    LOS: 4 days    Time spent: over 30 min    Fayrene Helper, MD [facility] Pager AMION  If 7PM-7AM, please contact night-coverage [email or web address] Password [email or web address] 01/14/2019, 2:21 PM   PROGRESS NOTE    Daniel [name]  ZJI:[id number] DOB: February 11, 1934 DOA: 01/10/2019 PCP: Jani Gravel, MD   Brief Narrative:  Daniel [name] is Rayola Everhart 85 y.o. male with medical history significant of liver cirrhosis, alcohol dependence, CHF, CAD status post stent placement, type 2 diabetes, CKD stage III, hypertension, hyperlipidemia, peripheral neuropathy, seizures, alcohol abuse, prostate cancer status post radioactive seed implant presenting to the hospital via EMS from his nursing home for evaluation of bilateral lower extremity edema extending up to his abdomen.  Patient is not sure why he is here.  He has no complaints.  Denies shortness of breath, orthopnea, or lower extremity edema.  Denies chest pain.  States his appetite is good.  No additional history could be obtained from him.  ED Course: Bradycardic with heart rate ranging from the 40s to 60s.  Not hypotensive.  Not tachypneic or hypoxic.  No leukocytosis.  Bicarb 17, anion gap 12.  Bicarb mildly low on labs done Pallas Wahlert month ago as well.  BUN 80, creatinine 3.6.  Baseline creatinine  1.3.  LFTs elevated (AST 117, ALT 69, alk phos 373).  LFTs were elevated on labs done Handsome Anglin month ago as well.  High-sensitivity troponin 55.  EKG without acute ischemic changes.  BNP 181.  SARS-CoV-2 test pending. Patient received IV Lasix 40 mg.  Assessment & Plan:   Principal Problem:   Edema Active Problems:   Alcohol dependence (HCC)   Type II diabetes mellitus with renal manifestations (HCC)   AKI (acute kidney injury) (HCC)   Bradycardia  Anasarca   Volume Overload   Cirrhosis: Patient has significant bilateral lower extremity edema extending up to the abdomen.  Does have Shaila Gilchrest history of CHF and last echo done in July 2020 with LVEF 45 to 50% and left ventricular diastolic parameters consistent with pseudonormalization.  However, BNP not significantly elevated.  Chest x-ray showing moderate left pleural effusion but no overt pulmonary edema.  Patient has no complaints of dyspnea or orthopnea.  Not tachypneic or hypoxic.  Suspect his edema is related to decompensated liver cirrhosis.  Albumin is low at 2.1. Ultrasound with evidence of hepatic cirrhosis and moderate volume ascites.  -Appreciate renal assistance - attempting midodrine and IV lasix for diuresis  -paracentesis 10/22 with 2 L off - no evidence of SBP - follow cx (NGTD) -unable to start spironolactone right now due to AKI  -consider therapeutic paracentesis +/- thoracentesis as well pending course -Monitor intake and output, daily weights, low-sodium diet with fluid restriction  AKI on CKD 3   NAGMA BUN 80, creatinine 3.6.  Baseline creatinine 1.3.  AKI likely related to volume overload, intravascularly dry with hypoalbuminemia, possibly related to diuresis as well (apparently per son, he was being managed at nursing facility for volume overload and may have been getting higher doses of lasix?).  Hepatorenal should be on differential as well with his cirrhosis.  -creatinine worsened today to 3.9 -IV Lasix for diuresis as above, give  albumin with this this PM - Appreciate renal assistance -> diuresing with midodrine  - urine is bland, follow urine lytes (urine Na not suggestive of HRS) - bicarb - I/O, daily weights - Monitor urine output - Avoid nephrotoxic agents/contrast  Sinus bradycardia Bradycardic with heart rate ranging from the 40s to 60s.  Not hypotensive.  Asymptomatic.  Not on any AV nodal blocking agent. -Check TSH, free T4 levels (T4 elevated) - mildly abnormal - follow repeat outpatient  -Cardiac monitoring  Normal anion gap metabolic acidosis Likely related to AKI. -Continue to monitor  Mildly elevated troponin High-sensitivity troponin 55 >49.  Troponin previously mildly elevated as well.  EKG without acute ischemic changes.  Patient has no complaints of chest pain and appears comfortable. -Cardiac monitoring  Insulin-dependent diabetes mellitus A1c 6.6 on 7/23. -Sliding scale insulin sensitive and CBG checks - BG's reasonable   History of alcohol dependence Patient is currently living in [facility].  No signs of withdrawal. -CIWA protocol; Ativan as needed -Thiamine, folate, multivitamin  Anemia: continue to monitor, no si/sx of bleeding.  Stable, follow  Poor PO intake: will c/s dietitian   needs peer to peer for SNF placement - deadline is Monday 12 noon,  the number is [phone] opt 5  DVT prophylaxis: heparin  Code Status: DNR Family Communication: called son, no answer on 10/22 or 10/23 Disposition Plan: pending further improvement  Consultants:   IR  Nephrology  Procedures:   IR paracentesis 10/22  Antimicrobials:  Anti-infectives (From admission, onward)   None     Subjective: Denies pain or discomfort today Speaking Miette Molenda little more  Objective: Vitals:   01/14/19 0022 01/14/19 0318 01/14/19 0904 01/14/19 1154  BP: 115/69 (!) 119/50 Marland Kitchen)  111/44 (!) 125/44  Pulse: 78 75 76 77  Resp: '16 14  16  ' Temp: 97.8 F (36.6 C) 97.8 F (36.6 C) 98.6 F (37  C) 97.8 F (36.6 C)  TempSrc: Oral Oral Oral Oral  SpO2: 98% 96% 97% 100%  Weight:  85.2 kg      Intake/Output Summary (Last 24 hours) at 01/14/2019 1421 Last data filed at 01/14/2019 1419 Gross per 24 hour  Intake 360 ml  Output 876 ml  Net -516 ml   Filed Weights   01/12/19 0702 01/13/19 0712 01/14/19 0318  Weight: 92.3 kg 86.8 kg 85.2 kg    Examination:  General: No acute distress. Cardiovascular: Heart sounds show Ilda Laskin regular rate, and rhythm. Lungs: Clear to auscultation bilaterally  Abdomen: Soft, nontender, distended Neurological: Alert, but disoriented, speaking more today. Moves all extremities 4. Cranial nerves II through XII grossly intact. Skin: Warm and dry. No rashes or lesions. Extremities: bilateral LE edema      Data Reviewed: I have personally reviewed following labs and imaging studies  CBC: Recent Labs  Lab 01/10/19 2132 01/12/19 0418 01/12/19 2024 01/13/19 0439 01/14/19 0448  WBC 7.9 7.5  --  8.2 8.1  NEUTROABS 4.5  --   --   --   --   HGB 12.4* 10.4* 11.2* 10.7* 10.8*  HCT 37.5* 29.6* 31.8* 30.7* 31.2*  MCV 99.5 94.3  --  95.3 96.0  PLT 205 150  --  164 099*   Basic Metabolic Panel: Recent Labs  Lab 01/11/19 1046 01/11/19 1600 01/12/19 0418 01/13/19 0439 01/14/19 0448  NA 135 135 136 137 140  K 4.0 4.1 3.8 4.1 4.3  CL 108 106 109 108 111  CO2 18* 17* 15* 16* 18*  GLUCOSE 102* 129* 89 95 141*  BUN 81* 79* 82* 84* 89*  CREATININE 3.58* 3.79* 3.82* 3.90* 4.02*  CALCIUM 8.5* 8.5* 8.4* 8.7* 8.7*  MG  --   --  2.3 2.3 2.4  PHOS  --   --   --  5.0* 4.9*   GFR: Estimated Creatinine Clearance: 14.9 mL/min (Bettey Muraoka) (by C-G formula based on SCr of 4.02 mg/dL (H)). Liver Function Tests: Recent Labs  Lab 01/10/19 2132 01/11/19 0344 01/12/19 0418 01/13/19 0439 01/14/19 0448  AST 117* 94* 85* 82* 91*  ALT 69* 56* 53* 51* 57*  ALKPHOS 373* 304* 313* 290* 316*  BILITOT 1.2 1.0 1.1 1.5* 1.2  PROT 6.0* 4.9* 5.0* 5.1* 5.2*  ALBUMIN 2.6*  2.1* 2.4* 2.4* 2.4*   2.3*   No results for input(s): LIPASE, AMYLASE in the last 168 hours. No results for input(s): AMMONIA in the last 168 hours. Coagulation Profile: Recent Labs  Lab 01/10/19 2132  INR 1.2   Cardiac Enzymes: No results for input(s): CKTOTAL, CKMB, CKMBINDEX, TROPONINI in the last 168 hours. BNP (last 3 results) No results for input(s): PROBNP in the last 8760 hours. HbA1C: No results for input(s): HGBA1C in the last 72 hours. CBG: Recent Labs  Lab 01/13/19 1111 01/13/19 1623 01/13/19 2050 01/14/19 0613 01/14/19 1152  GLUCAP 88 106* 129* 132* 125*   Lipid Profile: No results for input(s): CHOL, HDL, LDLCALC, TRIG, CHOLHDL, LDLDIRECT in the last 72 hours. Thyroid Function Tests: No results for input(s): TSH, T4TOTAL, FREET4, T3FREE, THYROIDAB in the last 72 hours. Anemia Panel: No results for input(s): VITAMINB12, FOLATE, FERRITIN, TIBC, IRON, RETICCTPCT in the last 72 hours. Sepsis Labs: No results for input(s): PROCALCITON, LATICACIDVEN in the last 168 hours.  Recent Results (  from the past 240 hour(s))  SARS CORONAVIRUS 2 (TAT 6-24 HRS) Nasopharyngeal Nasopharyngeal Swab     Status: None   Collection Time: 01/10/19 11:35 PM   Specimen: Nasopharyngeal Swab  Result Value Ref Range Status   SARS Coronavirus 2 NEGATIVE NEGATIVE Final    Comment: (NOTE) SARS-CoV-2 target nucleic acids are NOT DETECTED. The SARS-CoV-2 RNA is generally detectable in upper and lower respiratory specimens during the acute phase of infection. Negative results do not preclude SARS-CoV-2 infection, do not rule out co-infections with other pathogens, and should not be used as the sole basis for treatment or other patient management decisions. Negative results must be combined with clinical observations, patient history, and epidemiological information. The expected result is Negative. Fact Sheet for Patients: SugarRoll.be Fact Sheet for  Healthcare Providers: https://www.woods-mathews.com/ This test is not yet approved or cleared by the [place] FDA and  has been authorized for detection and/or diagnosis of SARS-CoV-2 by FDA under an Emergency Use Authorization (EUA). This EUA will remain  in effect (meaning this test can be used) for the duration of the COVID-19 declaration under Section 56 4(b)(1) of the Act, 21 U.S.C. section 360bbb-3(b)(1), unless the authorization is terminated or revoked sooner. Performed at [facility], [place] [address], [place], [place] [address]   Gram stain     Status: None   Collection Time: 01/12/19  2:24 PM   Specimen: Abdomen; Peritoneal Fluid  Result Value Ref Range Status   Specimen Description PERITONEAL  Final   Special Requests NONE  Final   Gram Stain   Final    ABUNDANT WBC PRESENT, PREDOMINANTLY MONONUCLEAR NO ORGANISMS SEEN Performed at [facility], [place] [address], [place], [place] [address]    Report Status 01/12/2019 FINAL  Final  Culture, body fluid-bottle     Status: None (Preliminary result)   Collection Time: 01/12/19  2:24 PM   Specimen: Peritoneal Washings  Result Value Ref Range Status   Specimen Description PERITONEAL  Final   Special Requests NONE  Final   Culture   Final    NO GROWTH 2 DAYS Performed at [facility] [address], [place], [place] [address]    Report Status PENDING  Incomplete         Radiology Studies: Ir Paracentesis  Result Date: 01/12/2019 INDICATION: Abdominal distention. Acute renal insufficiency. Ascites. Request for diagnostic and therapeutic paracentesis with max 2 L. EXAM: ULTRASOUND GUIDED LEFT LOWER QUADRANT PARACENTESIS MEDICATIONS: None. COMPLICATIONS: None immediate. PROCEDURE: Informed written consent was obtained from the patient after Mirl Hillery discussion of the risks, benefits and alternatives to treatment. Lenni Reckner timeout was performed prior to the initiation of the procedure. Initial ultrasound  scanning demonstrates Aliyah Abeyta large amount of ascites within the left lower abdominal quadrant. The left lower abdomen was prepped and draped in the usual sterile fashion. 1% lidocaine was used for local anesthesia. Following this, Faye Strohman 19 gauge, 7-cm, Yueh catheter was introduced. An ultrasound image was saved for documentation purposes. The paracentesis was performed. The catheter was removed and Bertine Schlottman dressing was applied. The patient tolerated the procedure well without immediate post procedural complication. FINDINGS: Savva Beamer total of approximately 2 L of clear yellow fluid was removed. Samples were sent to the laboratory as requested by the clinical team. IMPRESSION: Successful ultrasound-guided paracentesis yielding 2 liters of peritoneal fluid. Read by: Ascencion Dike PA-C Electronically Signed   By: Jerilynn Mages.  Shick M.D.   On: 01/12/2019 14:09  Scheduled Meds:  feeding supplement (PRO-STAT SUGAR FREE 64)  30 mL Oral TID BM   folic acid  1 mg Oral Daily   furosemide  40 mg Intravenous Q12H   heparin  5,000 Units Subcutaneous Q8H   insulin aspart  0-5 Units Subcutaneous QHS   insulin aspart  0-9 Units Subcutaneous TID WC   midodrine  10 mg Oral TID WC   multivitamin with minerals  1 tablet Oral Daily   potassium chloride  40 mEq Oral Daily   sodium bicarbonate  1,300 mg Oral BID   thiamine  100 mg Oral Daily   Continuous Infusions:    LOS: 4 days    Time spent: over 30 min    Fayrene Helper, MD [facility] Pager AMION  If 7PM-7AM, please contact night-coverage [email or web address] Password [email or web address] 01/14/2019, 2:21 PM

## 2019-01-14 NOTE — Progress Notes (Signed)
[facility]    NEPHROLOGY PROGRESS NOTE  SUBJECTIVE: Patient awake on exam, confused, unable to provide any other associated history.     OBJECTIVE:  Vitals:   01/14/19 0904 01/14/19 1154  BP: (!) 111/44 (!) 125/44  Pulse: 76 77  Resp:  16  Temp: 98.6 F (37 C) 97.8 F (36.6 C)  SpO2: 97% 100%    Intake/Output Summary (Last 24 hours) at 01/14/2019 1505 Last data filed at 01/14/2019 1419 Gross per 24 hour  Intake 360 ml  Output 876 ml  Net -516 ml      General: Awake, not alert to self, no acute distress  HEENT: MMM Lucas AT anicteric sclera Neck:  No JVD, no adenopathy CV:  Heart RRR  Lungs:  L/S CTA bilaterally Abd:  abd distended with a fluid wave with normal BS GU:  Bladder non-palpable Extremities: +1 bilateral lower extremity edema Skin:  No skin rash  MEDICATIONS:  . feeding supplement (PRO-STAT SUGAR FREE 64)  30 mL Oral TID BM  . folic acid  1 mg Oral Daily  . furosemide  40 mg Intravenous Q12H  . heparin  5,000 Units Subcutaneous Q8H  . insulin aspart  0-5 Units Subcutaneous QHS  . insulin aspart  0-9 Units Subcutaneous TID WC  . midodrine  10 mg Oral TID WC  . multivitamin with minerals  1 tablet Oral Daily  . potassium chloride  40 mEq Oral Daily  . sodium bicarbonate  1,300 mg Oral BID  . tamsulosin  0.4 mg Oral Daily  . thiamine  100 mg Oral Daily       LABS:   CBC Latest Ref Rng & Units 01/14/2019 01/13/2019 01/12/2019  WBC 4.0 - 10.5 K/uL 8.1 8.2 -  Hemoglobin 13.0 - 17.0 g/dL 10.8(L) 10.7(L) 11.2(L)  Hematocrit 39.0 - 52.0 % 31.2(L) 30.7(L) 31.8(L)  Platelets 150 - 400 K/uL 146(L) 164 -    CMP Latest Ref Rng & Units 01/14/2019 01/13/2019 01/12/2019  Glucose 70 - 99 mg/dL 141(H) 95 89  BUN 8 - 23 mg/dL 89(H) 84(H) 82(H)  Creatinine 0.61 - 1.24 mg/dL 4.02(H) 3.90(H) 3.82(H)  Sodium 135 - 145 mmol/L 140 137 136  Potassium 3.5 - 5.1 mmol/L 4.3 4.1 3.8  Chloride 98 - 111 mmol/L 111 108 109  CO2 22 - 32 mmol/L 18(L)  16(L) 15(L)  Calcium 8.9 - 10.3 mg/dL 8.7(L) 8.7(L) 8.4(L)  Total Protein 6.5 - 8.1 g/dL 5.2(L) 5.1(L) 5.0(L)  Total Bilirubin 0.3 - 1.2 mg/dL 1.2 1.5(H) 1.1  Alkaline Phos 38 - 126 U/L 316(H) 290(H) 313(H)  AST 15 - 41 U/L 91(H) 82(H) 85(H)  ALT 0 - 44 U/L 57(H) 51(H) 53(H)    Lab Results  Component Value Date   CALCIUM 8.7 (L) 01/14/2019   CAION 1.16 01/23/2018   PHOS 4.9 (H) 01/14/2019       Component Value Date/Time   COLORURINE YELLOW 01/11/2019 0538   APPEARANCEUR CLEAR 01/11/2019 0538   LABSPEC 1.010 01/11/2019 0538   PHURINE 5.0 01/11/2019 0538   GLUCOSEU NEGATIVE 01/11/2019 0538   HGBUR NEGATIVE 01/11/2019 0538   BILIRUBINUR NEGATIVE 01/11/2019 0538   KETONESUR NEGATIVE 01/11/2019 0538   PROTEINUR NEGATIVE 01/11/2019 0538   UROBILINOGEN 1.0 04/17/2013 1014   NITRITE NEGATIVE 01/11/2019 0538   LEUKOCYTESUR NEGATIVE 01/11/2019 0538      Component Value Date/Time   PHART 7.445 06/17/2017 0415   PCO2ART 27.5 (L) 06/17/2017 0415   PO2ART 154.0 (H) 06/17/2017 0415   HCO3 18.9 (L)  06/17/2017 0415   TCO2 15 (L) 01/23/2018 1430   ACIDBASEDEF 4.0 (H) 06/17/2017 0415   O2SAT 99.0 06/17/2017 0415    No results found for: IRON, TIBC, FERRITIN, IRONPCTSAT     ASSESSMENT/PLAN:    85 year old white male nursing home resident with multiple medical issues including some stage III CKD and dementia. He presents with volume overload and acute on chronic renal failure 1.Renal-acute on chronic renal failure (CKD stage III with baseline 1.4 in August of 2020)  in the setting of cirrhosis, decreased ejection fraction, and volume overload. He was on Cozaar as an outpatient and blood pressure is soft. That along with a fairly normal renal ultrasound and a bland urinalysis suggests that this acute kidney injury has been hemodynamically mediated. Either by cardiorenal syndrome, hepatorenal syndrome or just hypotension on an ARB. ARB has been held. He is no longer on any  antihypertensives. Urine sodium not consistent with hepatorenal. Not a candidate for dialysis therapy given his chronic debilitated state, age and presumed dementia but there are no indications for dialysis today.  DC furosemide 2. Hypertension/volume-volume overloaded with a soft blood pressure. All antihypertensives have been stopped. Stop furosemide for worsening renal function 3.Electrolytes-potassium a little low, will attempt to replete as is diuresis is successful it will go down more  4.Metabolic acidosis-in the setting of acute kidney injury. Will cont sodium bicarb  4. Anemia-is not significant at this time nor does it require treatment at this time  Palliative care would be the most appropriate.     [place], DO, [place]

## 2019-01-15 DIAGNOSIS — Z515 Encounter for palliative care: Secondary | ICD-10-CM | POA: Diagnosis not present

## 2019-01-15 DIAGNOSIS — N179 Acute kidney failure, unspecified: Secondary | ICD-10-CM | POA: Diagnosis not present

## 2019-01-15 DIAGNOSIS — R609 Edema, unspecified: Secondary | ICD-10-CM | POA: Diagnosis not present

## 2019-01-15 DIAGNOSIS — R601 Generalized edema: Secondary | ICD-10-CM

## 2019-01-15 DIAGNOSIS — Z7189 Other specified counseling: Secondary | ICD-10-CM

## 2019-01-15 LAB — AMMONIA: Ammonia: 17 umol/L (ref 9–35)

## 2019-01-15 LAB — RENAL FUNCTION PANEL
Albumin: 2.3 g/dL — ABNORMAL LOW (ref 3.5–5.0)
Anion gap: 12 (ref 5–15)
BUN: 94 mg/dL — ABNORMAL HIGH (ref 8–23)
CO2: 18 mmol/L — ABNORMAL LOW (ref 22–32)
Calcium: 8.8 mg/dL — ABNORMAL LOW (ref 8.9–10.3)
Chloride: 111 mmol/L (ref 98–111)
Creatinine, Ser: 4.03 mg/dL — ABNORMAL HIGH (ref 0.61–1.24)
GFR calc Af Amer: 15 mL/min — ABNORMAL LOW (ref >60)
GFR calc non Af Amer: 13 mL/min — ABNORMAL LOW (ref >60)
Glucose, Bld: 114 mg/dL — ABNORMAL HIGH (ref 70–99)
Phosphorus: 4.8 mg/dL — ABNORMAL HIGH (ref 2.5–4.6)
Potassium: 4 mmol/L (ref 3.5–5.1)
Sodium: 141 mmol/L (ref 135–145)

## 2019-01-15 LAB — CBC
HCT: 30.7 % — ABNORMAL LOW (ref 39.0–52.0)
Hemoglobin: 10.7 g/dL — ABNORMAL LOW (ref 13.0–17.0)
MCH: 33.3 pg (ref 26.0–34.0)
MCHC: 34.9 g/dL (ref 30.0–36.0)
MCV: 95.6 fL (ref 80.0–100.0)
Platelets: 131 10*3/uL — ABNORMAL LOW (ref 150–400)
RBC: 3.21 MIL/uL — ABNORMAL LOW (ref 4.22–5.81)
RDW: 14.3 % (ref 11.5–15.5)
WBC: 8.8 10*3/uL (ref 4.0–10.5)
nRBC: 0 % (ref 0.0–0.2)

## 2019-01-15 LAB — GLUCOSE, CAPILLARY
Glucose-Capillary: 113 mg/dL — ABNORMAL HIGH (ref 70–99)
Glucose-Capillary: 121 mg/dL — ABNORMAL HIGH (ref 70–99)
Glucose-Capillary: 137 mg/dL — ABNORMAL HIGH (ref 70–99)
Glucose-Capillary: 139 mg/dL — ABNORMAL HIGH (ref 70–99)

## 2019-01-15 LAB — MAGNESIUM: Magnesium: 2.3 mg/dL (ref 1.7–2.4)

## 2019-01-15 NOTE — Progress Notes (Signed)
PROGRESS NOTE    Daniel [name]  DPO:[id number] DOB: 1933/07/01 DOA: 01/10/2019 PCP: Jani Gravel, MD   Brief Narrative:  Daniel [name] is Daniel [name] 85 y.o. male with medical history significant of liver cirrhosis, alcohol dependence, CHF, CAD status post stent placement, type 2 diabetes, CKD stage III, hypertension, hyperlipidemia, peripheral neuropathy, seizures, alcohol abuse, prostate cancer status post radioactive seed implant presenting to the hospital via EMS from his nursing home for evaluation of bilateral lower extremity edema extending up to his abdomen.  Patient is not sure why he is here.  He has no complaints.  Denies shortness of breath, orthopnea, or lower extremity edema.  Denies chest pain.  States his appetite is good.  No additional history could be obtained from him.  ED Course: Bradycardic with heart rate ranging from the 40s to 60s.  Not hypotensive.  Not tachypneic or hypoxic.  No leukocytosis.  Bicarb 17, anion gap 12.  Bicarb mildly low on labs done Alexsandro Salek month ago as well.  BUN 80, creatinine 3.6.  Baseline creatinine 1.3.  LFTs elevated (AST 117, ALT 69, alk phos 373).  LFTs were elevated on labs done [name] month ago as well.  High-sensitivity troponin 55.  EKG without acute ischemic changes.  BNP 181.  SARS-CoV-2 test pending. Patient received IV Lasix 40 mg.  Assessment & Plan:   Principal Problem:   Edema Active Problems:   Alcohol dependence (HCC)   Type II diabetes mellitus with renal manifestations (HCC)   AKI (acute kidney injury) (HCC)   Bradycardia  Anasarca  Volume Overload  Cirrhosis: Patient has significant bilateral lower extremity edema extending up to the abdomen.  Does have Jaclynne Baldo history of CHF and last echo done in July 2020 with LVEF 45 to 50% and left ventricular diastolic parameters consistent with pseudonormalization.  However, BNP not significantly elevated.  Chest x-ray showing moderate left pleural effusion but no overt pulmonary edema.  Patient has no  complaints of dyspnea or orthopnea.  Not tachypneic or hypoxic.  Suspect his edema is related to decompensated liver cirrhosis.  Albumin is low at 2.1. Ultrasound with evidence of hepatic cirrhosis and moderate volume ascites.  -Appreciate renal assistance - was on midodrine and lasix for diuresis.  Lasix now d/c'd with worsening renal function.  Palliative care c/s has been requested.   -paracentesis 10/22 with 2 L off - no evidence of SBP - follow cx (NGTD) -unable to start spironolactone right now due to AKI  -consider therapeutic paracentesis +/- thoracentesis as well pending course -Monitor intake and output, daily weights, low-sodium diet with fluid restriction  AKI on CKD 3  NAGMA BUN 80, creatinine 3.6.  Baseline creatinine 1.3.  AKI likely related to volume overload, intravascularly dry with hypoalbuminemia, possibly related to diuresis as well (apparently per son, he was being managed at nursing facility for volume overload and may have been getting higher doses of lasix?).  Hepatorenal should be on differential as well with his cirrhosis.  -creatinine worsened today to 4.02 - Appreciate renal assistance -> lasix has been discontinued.  Continue midodrine.  Recommended palliative care c/s.  Bicarb supplementation. - urine is bland, follow urine lytes (urine Na not suggestive of HRS) - bicarb - I/O, daily weights - Monitor urine output - Avoid nephrotoxic agents/contrast  Acute Metabolic Encephalopathy: this does not appear to be far from his baseline.  Has been similar throughout admission.  Will check ammonia to see if he may benefit from lactulose.   Delirium precautions  Urinary Retention?: ? Of low UOP yesterday during day and was I/O cath'd.  Difficult to use bladder scan accurately due to ascites.  Had 400 out yesterday with I/O cath.  Will need to follow closely UOP.  May need repeat I/O cath vs foley placement.  Dysphagia: per nursing, pt pocketing, needs Blas Riches lot of coaching  to take meds and swallow.  Will have speech eval.  Sinus bradycardia Bradycardic with heart rate ranging from the 40s to 60s.  Not hypotensive.  Asymptomatic.  Not on any AV nodal blocking agent. -Check TSH, free T4 levels (T4 elevated) - mildly abnormal - follow repeat outpatient  -Cardiac monitoring  Normal anion gap metabolic acidosis Likely related to AKI. -Continue to monitor  Mildly elevated troponin High-sensitivity troponin 55 >49.  Troponin previously mildly elevated as well.  EKG without acute ischemic changes.  Patient has no complaints of chest pain and appears comfortable. -Cardiac monitoring  Insulin-dependent diabetes mellitus A1c 6.6 on 7/23. -Sliding scale insulin sensitive and CBG checks - BG's reasonable   History of alcohol dependence Patient is currently [facility].  No signs of withdrawal. -CIWA protocol; Ativan as needed -Thiamine, folate, multivitamin  Anemia: continue to monitor, no si/sx of bleeding.  Stable, follow  Poor PO intake: will c/s dietitian   needs peer to peer for SNF placement - deadline is Monday 12 noon,  the number is [phone] opt 5  DVT prophylaxis: heparin  Code Status: DNR Family Communication: called son, no answer on 10/22 or 10/23.  Called wife, no answer 10/24.  Will call again on 10/25. Disposition Plan: pending further improvement - need goals of care discussion - palliative has been consulted.    Consultants:   IR  Nephrology  Procedures:   IR paracentesis 10/22  Antimicrobials:  Anti-infectives (From admission, onward)   None     Subjective: No complaints.  Doesn't say much today.  Objective: Vitals:   01/14/19 1154 01/14/19 2040 01/15/19 0018 01/15/19 0533  BP: (!) 125/44 (!) 119/43 (!) 99/43 (!) 92/43  Pulse: 77 73 81 81  Resp: '16 14 16 16  ' Temp: 97.8 F (36.6 C) 98.1 F (36.7 C) 97.8 F (36.6 C) 97.6 F (36.4 C)  TempSrc: Oral Oral Oral Oral  SpO2: 100% 98% 96% 96%   Weight:    86.8 kg    Intake/Output Summary (Last 24 hours) at 01/15/2019 1044 Last data filed at 01/15/2019 0908 Gross per 24 hour  Intake 360 ml  Output 815 ml  Net -455 ml   Filed Weights   01/13/19 0712 01/14/19 0318 01/15/19 0533  Weight: 86.8 kg 85.2 kg 86.8 kg    Examination:  General: No acute distress. Cardiovascular: Heart sounds show Kamariyah Timberlake regular rate, and rhythm.  Lungs: Clear to auscultation bilaterally  Abdomen: Soft, nontender, nondistended  Neurological: Alert and disoriented. Moves all extremities 4. Cranial nerves II through XII grossly intact. Skin: Warm and dry. No rashes or lesions. Extremities: bilateral LE edema   Data Reviewed: I have personally reviewed following labs and imaging studies  CBC: Recent Labs  Lab 01/10/19 2132 01/12/19 0418 01/12/19 2024 01/13/19 0439 01/14/19 0448 01/15/19 0435  WBC 7.9 7.5  --  8.2 8.1 8.8  NEUTROABS 4.5  --   --   --   --   --   HGB 12.4* 10.4* 11.2* 10.7* 10.8* 10.7*  HCT 37.5* 29.6* 31.8* 30.7* 31.2* 30.7*  MCV 99.5 94.3  --  95.3 96.0 95.6  PLT 205 150  --  164 146* 626*   Basic Metabolic Panel: Recent Labs  Lab 01/11/19 1600 01/12/19 0418 01/13/19 0439 01/14/19 0448 01/15/19 0435  NA 135 136 137 140 141  K 4.1 3.8 4.1 4.3 4.0  CL 106 109 108 111 111  CO2 17* 15* 16* 18* 18*  GLUCOSE 129* 89 95 141* 114*  BUN 79* 82* 84* 89* 94*  CREATININE 3.79* 3.82* 3.90* 4.02* 4.03*  CALCIUM 8.5* 8.4* 8.7* 8.7* 8.8*  MG  --  2.3 2.3 2.4 2.3  PHOS  --   --  5.0* 4.9* 4.8*   GFR: Estimated Creatinine Clearance: 15 mL/min (Lavante Toso) (by C-G formula based on SCr of 4.03 mg/dL (H)). Liver Function Tests: Recent Labs  Lab 01/10/19 2132 01/11/19 0344 01/12/19 0418 01/13/19 0439 01/14/19 0448 01/15/19 0435  AST 117* 94* 85* 82* 91*  --   ALT 69* 56* 53* 51* 57*  --   ALKPHOS 373* 304* 313* 290* 316*  --   BILITOT 1.2 1.0 1.1 1.5* 1.2  --   PROT 6.0* 4.9* 5.0* 5.1* 5.2*  --   ALBUMIN 2.6* 2.1* 2.4* 2.4*  2.4*  2.3* 2.3*   No results for input(s): LIPASE, AMYLASE in the last 168 hours. No results for input(s): AMMONIA in the last 168 hours. Coagulation Profile: Recent Labs  Lab 01/10/19 2132  INR 1.2   Cardiac Enzymes: No results for input(s): CKTOTAL, CKMB, CKMBINDEX, TROPONINI in the last 168 hours. BNP (last 3 results) No results for input(s): PROBNP in the last 8760 hours. HbA1C: No results for input(s): HGBA1C in the last 72 hours. CBG: Recent Labs  Lab 01/14/19 0613 01/14/19 1152 01/14/19 1642 01/14/19 2129 01/15/19 0624  GLUCAP 132* 125* 127* 104* 113*   Lipid Profile: No results for input(s): CHOL, HDL, LDLCALC, TRIG, CHOLHDL, LDLDIRECT in the last 72 hours. Thyroid Function Tests: No results for input(s): TSH, T4TOTAL, FREET4, T3FREE, THYROIDAB in the last 72 hours. Anemia Panel: No results for input(s): VITAMINB12, FOLATE, FERRITIN, TIBC, IRON, RETICCTPCT in the last 72 hours. Sepsis Labs: No results for input(s): PROCALCITON, LATICACIDVEN in the last 168 hours.  Recent Results (from the past 240 hour(s))  SARS CORONAVIRUS 2 (TAT 6-24 HRS) Nasopharyngeal Nasopharyngeal Swab     Status: None   Collection Time: 01/10/19 11:35 PM   Specimen: Nasopharyngeal Swab  Result Value Ref Range Status   SARS Coronavirus 2 NEGATIVE NEGATIVE Final    Comment: (NOTE) SARS-CoV-2 target nucleic acids are NOT DETECTED. The SARS-CoV-2 RNA is generally detectable in upper and lower respiratory specimens during the acute phase of infection. Negative results do not preclude SARS-CoV-2 infection, do not rule out co-infections with other pathogens, and should not be used as the sole basis for treatment or other patient management decisions. Negative results must be combined with clinical observations, patient history, and epidemiological information. The expected result is Negative. Fact Sheet for Patients: SugarRoll.be Fact Sheet for Healthcare  Providers: https://www.woods-mathews.com/ This test is not yet approved or cleared by the [place] FDA and  has been authorized for detection and/or diagnosis of SARS-CoV-2 by FDA under an Emergency Use Authorization (EUA). This EUA will remain  in effect (meaning this test can be used) for the duration of the COVID-19 declaration under Section 56 4(b)(1) of the Act, 21 U.S.C. section 360bbb-3(b)(1), unless the authorization is terminated or revoked sooner. Performed at [facility], [place] [address] [place], [place] [address]   Gram stain  Status: None   Collection Time: 01/12/19  2:24 PM   Specimen: Abdomen; Peritoneal Fluid  Result Value Ref Range Status   Specimen Description PERITONEAL  Final   Special Requests NONE  Final   Gram Stain   Final    ABUNDANT WBC PRESENT, PREDOMINANTLY MONONUCLEAR NO ORGANISMS SEEN Performed at [facility], [address], [place], [place] [address]    Report Status 01/12/2019 FINAL  Final  Culture, body fluid-bottle     Status: None (Preliminary result)   Collection Time: 01/12/19  2:24 PM   Specimen: Peritoneal Washings  Result Value Ref Range Status   Specimen Description PERITONEAL  Final   Special Requests NONE  Final   Culture   Final    NO GROWTH 2 DAYS Performed at [facility] [address], [place], [place] [address]    Report Status PENDING  Incomplete         Radiology Studies: No results found.      Scheduled Meds: . feeding supplement (PRO-STAT SUGAR FREE 64)  30 mL Oral TID BM  . folic acid  1 mg Oral Daily  . heparin  5,000 Units Subcutaneous Q8H  . insulin aspart  0-5 Units Subcutaneous QHS  . insulin aspart  0-9 Units Subcutaneous TID WC  . midodrine  10 mg Oral TID WC  . multivitamin with minerals  1 tablet Oral Daily  . potassium chloride  40 mEq Oral Daily  . sodium bicarbonate  1,300 mg Oral BID  . tamsulosin  0.4 mg Oral Daily  . thiamine  100 mg Oral Daily    Continuous Infusions:    LOS: 5 days    Time spent: over 30 min    Fayrene Helper, MD [facility] Pager AMION  If 7PM-7AM, please contact night-coverage [email or web address] Password [facility] 01/15/2019, 10:44 AM

## 2019-01-15 NOTE — Evaluation (Addendum)
Clinical/Bedside Swallow Evaluation Patient Details  Name: Daniel [name] MRN: [id number] Date of Birth: [birth year]  Today's Date: 01/15/2019 Time: SLP Start Time (ACUTE ONLY): 1761 SLP Stop Time (ACUTE ONLY): 1600 SLP Time Calculation (min) (ACUTE ONLY): 16 min  Past Medical History:  Past Medical History:  Diagnosis Date  . Alcohol abuse   . Anasarca 01/13/2019  . Cancer [facility])    prostate  . Cardiomyopathy ([place])   . CHF (congestive heart failure) ([place])   . Complication of anesthesia    Hx of seizure when put to sleep for surgery on 08/2009  . Constipation   . Coronary artery disease   . Diabetes mellitus without complication ([place])   . H/O hiatal hernia   . Hyperlipidemia   . Hypertension   . Peripheral neuropathy   . Seizures ([place])   . Urinary incontinence    Past Surgical History:  Past Surgical History:  Procedure Laterality Date  . CATARACT EXTRACTION, BILATERAL    . CORONARY ANGIOPLASTY     stents x 2  . CORONARY STENT PLACEMENT     2004  . IR PARACENTESIS  01/12/2019  . PROSTATE SURGERY  2008   SEED IMPLANTS  . RADIOACTIVE SEED IMPLANT     prostate  . TONSILLECTOMY     1940's   HPI:  Daniel [name] is a 85 y.o. male with medical history significant of liver cirrhosis, alcohol dependence, CHF, CAD status post stent placement, type 2 diabetes, CKD stage III, hypertension, hyperlipidemia, peripheral neuropathy, seizures, alcohol abuse, prostate cancer status post radioactive seed implant presenting to the hospital via EMS from his nursing home for evaluation of bilateral lower extremity edema extending up to his abdomen. CXR 10/21: "Moderate left pleural effusion with associated basilar airspacedisease, likely atelectasis."  RN reports some pocketing with food and medication.   Assessment / Plan / Recommendation Clinical Impression  Pt presents with mild oral impairments.  Pt tolerated all consistencies trialed with no clinical s/s of aspiration.  Prolonged  oral phase was noted with regular solid and there was moderate oral residue, mostly adhering to hard palate.  Pt was able to clear residue with liquid wash.  With soft solid trials, oral phase was more efficient and no oral residue was noted.  Pt's son present for evaluation and reports that pt does not have hx of dysphagia or modified diet; however, d/t COVID visitation restrictions, he may not be aware of all changes while pt has been at [facility].  Please assist pt with feeding as he has limited use of hands.  If needed follow solids with thin liquid was to clear residue from oral cavity.  Pt has no further ST needs at this time.  SLP will sign off.  Recommend continuing mechanical soft diet with thin liquid.  Consider giving medications whole with liquid.  Crushing medications may be unpalatable to pt and may make it more difficult for pt to take medications.   SLP Visit Diagnosis: Dysphagia, oral phase (R13.11)    Aspiration Risk  Mild aspiration risk    Diet Recommendation Dysphagia 3 (Mech soft);Thin liquid   Liquid Administration via: Cup;Straw Medication Administration: Whole meds with liquid Supervision: Staff to assist with self feeding Compensations: Slow rate;Small sips/bites;Alternate solids and liquids as need to clear oral cavity Postural Changes: Seated upright at 90 degrees    Other  Recommendations Oral Care Recommendations: Oral care BID   Follow up Recommendations None      Frequency and Duration   N/A  Prognosis   N/A     Swallow Study   General Date of Onset: 01/11/19 HPI: Daniel [name] is a 85 y.o. male with medical history significant of liver cirrhosis, alcohol dependence, CHF, CAD status post stent placement, type 2 diabetes, CKD stage III, hypertension, hyperlipidemia, peripheral neuropathy, seizures, alcohol abuse, prostate cancer status post radioactive seed implant presenting to the hospital via EMS from his nursing home for evaluation of  bilateral lower extremity edema extending up to his abdomen. CXR 10/21: "Moderate left pleural effusion with associated basilar airspacedisease, likely atelectasis."  RN reports some pocketing with food and medication. Type of Study: Bedside Swallow Evaluation Previous Swallow Assessment: None Diet Prior to this Study: Dysphagia 3 (soft);Thin liquids Temperature Spikes Noted: No History of Recent Intubation: No Behavior/Cognition: Cooperative;Alert;Pleasant mood Oral Cavity Assessment: Within Functional Limits Oral Care Completed by SLP: No Oral Cavity - Dentition: Adequate natural dentition;Missing dentition Self-Feeding Abilities: Needs assist Patient Positioning: Upright in bed Baseline Vocal Quality: Normal Volitional Cough: Strong Volitional Swallow: Able to elicit    Oral/Motor/Sensory Function Overall Oral Motor/Sensory Function: Within functional limits Facial ROM: Within Functional Limits Facial Symmetry: Within Functional Limits Lingual ROM: Within Functional Limits Lingual Symmetry: Within Functional Limits Lingual Strength: Within Functional Limits Velum: Within Functional Limits Mandible: Within Functional Limits(with tactile cuing)   Ice Chips Ice chips: Not tested   Thin Liquid Thin Liquid: Within functional limits Presentation: Cup;Straw    Nectar Thick Nectar Thick Liquid: Not tested   Honey Thick Honey Thick Liquid: Not tested   Puree Puree: Within functional limits Presentation: Spoon   Solid     Solid: Impaired Presentation: Spoon(SLP fed) Oral Phase Functional Implications: Oral residue;Prolonged oral transit      Daniel [name], Daniel [name], [employer] Office: [phone]; Pager (10/25): [phone] 01/15/2019,4:35 PM

## 2019-01-15 NOTE — Progress Notes (Signed)
[facility]    NEPHROLOGY PROGRESS NOTE  SUBJECTIVE: Patient awake on exam, confused, unable to provide any other associated history.   OBJECTIVE:  Vitals:   01/15/19 0533 01/15/19 1127  BP: (!) 92/43 (!) 107/49  Pulse: 81 78  Resp: 16 15  Temp: 97.6 F (36.4 C) 97.9 F (36.6 C)  SpO2: 96% 98%    Intake/Output Summary (Last 24 hours) at 01/15/2019 1406 Last data filed at 01/15/2019 0908 Gross per 24 hour  Intake 360 ml  Output 615 ml  Net -255 ml      General: Awake, not alert to self, no acute distress  HEENT: MMM [place] AT anicteric sclera Neck:  No JVD, no adenopathy CV:  Heart RRR  Lungs:  L/S CTA bilaterally Abd:  abd distended with a fluid wave with normal BS GU:  Bladder non-palpable Extremities: +1 bilateral lower extremity edema Skin:  No skin rash  MEDICATIONS:  . feeding supplement (PRO-STAT SUGAR FREE 64)  30 mL Oral TID BM  . folic acid  1 mg Oral Daily  . heparin  5,000 Units Subcutaneous Q8H  . insulin aspart  0-5 Units Subcutaneous QHS  . insulin aspart  0-9 Units Subcutaneous TID WC  . midodrine  10 mg Oral TID WC  . multivitamin with minerals  1 tablet Oral Daily  . potassium chloride  40 mEq Oral Daily  . sodium bicarbonate  1,300 mg Oral BID  . tamsulosin  0.4 mg Oral Daily  . thiamine  100 mg Oral Daily       LABS:   CBC Latest Ref Rng & Units 01/15/2019 01/14/2019 01/13/2019  WBC 4.0 - 10.5 K/uL 8.8 8.1 8.2  Hemoglobin 13.0 - 17.0 g/dL 10.7(L) 10.8(L) 10.7(L)  Hematocrit 39.0 - 52.0 % 30.7(L) 31.2(L) 30.7(L)  Platelets 150 - 400 K/uL 131(L) 146(L) 164    CMP Latest Ref Rng & Units 01/15/2019 01/14/2019 01/13/2019  Glucose 70 - 99 mg/dL 114(H) 141(H) 95  BUN 8 - 23 mg/dL 94(H) 89(H) 84(H)  Creatinine 0.61 - 1.24 mg/dL 4.03(H) 4.02(H) 3.90(H)  Sodium 135 - 145 mmol/L 141 140 137  Potassium 3.5 - 5.1 mmol/L 4.0 4.3 4.1  Chloride 98 - 111 mmol/L 111 111 108  CO2 22 - 32 mmol/L 18(L) 18(L) 16(L)  Calcium 8.9 - 10.3  mg/dL 8.8(L) 8.7(L) 8.7(L)  Total Protein 6.5 - 8.1 g/dL - 5.2(L) 5.1(L)  Total Bilirubin 0.3 - 1.2 mg/dL - 1.2 1.5(H)  Alkaline Phos 38 - 126 U/L - 316(H) 290(H)  AST 15 - 41 U/L - 91(H) 82(H)  ALT 0 - 44 U/L - 57(H) 51(H)    Lab Results  Component Value Date   CALCIUM 8.8 (L) 01/15/2019   CAION 1.16 01/23/2018   PHOS 4.8 (H) 01/15/2019       Component Value Date/Time   COLORURINE YELLOW 01/11/2019 0538   APPEARANCEUR CLEAR 01/11/2019 0538   LABSPEC 1.010 01/11/2019 0538   PHURINE 5.0 01/11/2019 0538   GLUCOSEU NEGATIVE 01/11/2019 0538   HGBUR NEGATIVE 01/11/2019 0538   BILIRUBINUR NEGATIVE 01/11/2019 0538   KETONESUR NEGATIVE 01/11/2019 0538   PROTEINUR NEGATIVE 01/11/2019 0538   UROBILINOGEN 1.0 04/17/2013 1014   NITRITE NEGATIVE 01/11/2019 0538   LEUKOCYTESUR NEGATIVE 01/11/2019 0538      Component Value Date/Time   PHART 7.445 06/17/2017 0415   PCO2ART 27.5 (L) 06/17/2017 0415   PO2ART 154.0 (H) 06/17/2017 0415   HCO3 18.9 (L) 06/17/2017 0415   TCO2 15 (L) 01/23/2018 1430  ACIDBASEDEF 4.0 (H) 06/17/2017 0415   O2SAT 99.0 06/17/2017 0415    No results found for: IRON, TIBC, FERRITIN, IRONPCTSAT     ASSESSMENT/PLAN:    85 year old white male nursing home resident with multiple medical issues including some stage III CKD and dementia. He presents with volume overload and acute on chronic renal failure 1.Renal-acute on chronic renal failure (CKD stage III with baseline 1.4 in August of 2020)  in the setting of cirrhosis, decreased ejection fraction, and volume overload. He was on Cozaar as an outpatient and blood pressure is soft. That along with a fairly normal renal ultrasound and a bland urinalysis suggests that this acute kidney injury has been hemodynamically mediated. Either by cardiorenal syndrome, hepatorenal syndrome or just hypotension on an ARB. ARB has been held. He is no longer on any antihypertensives. Urine sodium not consistent with hepatorenal.  Not a candidate for dialysis therapy given his chronic debilitated state, age and presumed dementia.  DC furosemide for now with minimal intake. 2. Hypertension/volume-volume overloaded with a soft blood pressure. All antihypertensives have been stopped. Stopped furosemide for worsening renal function 3.Electrolytes-potassium improved.  Discontinue KCl. 4.Metabolic acidosis-in the setting of acute kidney injury. Will cont sodium bicarb  4. Anemia-is not significant at this time nor does it require treatment at this time  Palliative care would be the most appropriate.     Media, DO, [place]

## 2019-01-15 NOTE — Consult Note (Signed)
Consultation Note Date: 01/15/2019   Patient Name: Daniel [name]  DOB: 1933/06/14  MRN: [id number]  Age / Sex: 85 y.o., male  PCP: Jani Gravel, MD Referring Physician: Elodia Florence., *  Reason for Consultation: Establishing goals of care  HPI/Patient Profile: 85 y.o. male  with past medical history of liver cirrhosis, alcohol dependence, CHF, CAD s/p stent, T2DM, CKD3, HTN, HLD, peripheral neuropathy, seizures, and prostate cancer admitted on 01/10/2019 with increasing bilateral lower extremity edema extending into abdomen.  Edema is attributed to decompensated liver cirrhosis. Albumin is 2.1. Was started on lasix, but this was d/c'd d/t worsening renal function. Creatinine up to 4.02, baseline is 1.3. Patient minimally interactive with poor PO intake. PMT consulted for [place].   Clinical Assessment and Goals of Care: I have reviewed medical records including EPIC notes, labs and imaging, and received report from RN - RN reports minimally interactive, mostly nonverbal. Patient refusing most PO intake. Not much urine output.   I then spoke with patient's wife and son  to discuss diagnosis prognosis, [place], EOL wishes, disposition and options.  I first spoke with patient's wife - she shares a bit about their situation - both had been living in [place] in rooms across from each other. She also attempts to share about patient's baseline status. When I asked if she would like an update on medical situation/discuss plan of care moving forward she asks me to call her sons as she feels they are better equipped to discuss these topics.   I attempted both sons - [name] is local and number listed in demographics. He did not answer. I then called [name] [phone]) who lives in [place] and we spoke at length. [name] tells me the family looks to him for decision making and he has been handling decisions regarding his father's care.   I introduced  Palliative Medicine as specialized medical care for people living with serious illness. It focuses on providing relief from the symptoms and stress of a serious illness. The goal is to improve quality of life for both the patient and the family.  As far as functional and nutritional status, [name] describes a rapid decline since May. He tells me it all started when his mother had heart surgery and was in a car accident - after these events she was no longer able to care for the patient. [name] and [name] attempted to provide care for the patient but he required more than they could do. [name] tells me of a decline in function - patient dependent for ADLs, required walker for ambulation but did not ambulate often. [name] tells me patient was quite reluctant to eat and unable to feed himself. He also speaks of cognitive decline - very slow to respond. He states :my father is a shell of who he was even 4 months ago".  [name] also tells me he thinks his dad is nearing the end of life d/t some statements he has made to the family - he believes his father has been attempting to prepare them.   [name] tells me the patient has been on the ventilator 3 times, also experienced a cardiac arrest. He speaks of the different types of aggressive care patient has received - tells me after the last intubation, his father completed DNR paperwork and made it known to the family he would never want such interventions again. He also tells me the patient has a living will limiting medical interventions. We do not have a copy of that  and [name] does not have it with him currently.   I ask [name] to describe his father to me and he talks about him being stoic. Further describes him as a man of pride and dignity - he would never want to be a burden on anyone. He feels that his father believes he is a burden and therefore he may be "giving up". We discussed the type of care his father would want if he were very ill - [name]  shares he thinks his father would want to focus on quality of life over length of life.    We discussed his current illness and what it means in the larger context of his on-going co-morbidities.  Natural disease trajectory and expectations at EOL were discussed. We discussed his liver failure leading to anasarca. Discussed that lasix had to be stopped d/t AKI. Discussed poor renal function - discussed hopeful for improvement now that lasix has been stopped? Discussed overall failure to thrive and decline in function, nutrition, and cognition.   [name] asks for clear prognosis - we discussed that prognosis is unclear - depends on hospital course - discussed he appears eligible for hospice services so <6 months is likely. Discussed time could be much shorter, patient certainly at risk for acute events. Also need to see improvement in PO intake - at this point not taking in much at all.   We discussed disposition options: rehab vs return to ALF with hospice to support. Discussed concern that patient may not be a good rehab candidate - [name] seems to agree with this. [name] also indicates he feels it is more important to focus on comfort and relief of suffering given poor prognosis. He seems to think returning to ALF with hospice support may be more appropriate than attempting rehab; however, he does feel overwhelmed making this decision. We discussed giving him a chance to speak with his brother and mother about this. [name] also requests that PMT attempt to speak with patient regarding this - we discussed that the patient's mental status limits his ability to participate in this type of conversation - however, [name] continues to request this stating "it's his life and his decision - I think he is in there and can tell you what he wants". We agreed that PMT would attempt to share information with patient. We ultimately agreed to follow up tomorrow after [name] speaks to other family and PMT has a chance  to attempt to speak with patient.   Questions and concerns were addressed. The family was encouraged to call with questions or concerns.   Primary Decision Maker NEXT OF KIN - wife however she defers to sons - son, [name] has been serving as Scientist, research (medical) - family shares information with one another and appear to make decisions jointly   SUMMARY OF RECOMMENDATIONS   - initial conversation with family - discussed medical situation and limited options, poor prognosis, likely hospice eligible - family deciding between rehab and return to ALF with hospice - one son leaning towards hospice but plans to discuss with other family members and requests that PMT attempt to discuss with patient (he is aware patient unlikely to participate in discussion) - PMT to follow up tomorrow  Code Status/Advance Care Planning:  DNR  Psycho-social/Spiritual:   Desire for further Chaplaincy support:no  Additional Recommendations: Education on Hospice  Prognosis:   < 6 months d/t liver disease, albumin 2.1, CKD, progressive malnutrition, rapid functional and cognitive decline over past 6 months  Discharge  Planning: To Be Determined      Primary Diagnoses: Present on Admission:  Edema  Alcohol dependence ([place])  Type II diabetes mellitus with renal manifestations ([place])   I have reviewed the medical record, interviewed the patient and family, and examined the patient. The following aspects are pertinent.  Past Medical History:  Diagnosis Date   Alcohol abuse    Anasarca 01/13/2019   Cancer ([place])    prostate   Cardiomyopathy ([place])    CHF (congestive heart failure) (HCC)    Complication of anesthesia    Hx of seizure when put to sleep for surgery on 08/2009   Constipation    Coronary artery disease    Diabetes mellitus without complication (H[facility])    H/O hiatal hernia    Hyperlipidemia    Hypertension    Peripheral neuropathy    Seizures (HCC)    Urinary  incontinence    Social History   Socioeconomic History   Marital status: Married    Spouse name: Not on file   Number of children: Not on file   Years of education: Not on file   Highest education level: Not on file  Occupational History   Not on file  Social Needs   Financial resource strain: Not on file   Food insecurity    Worry: Not on file    Inability: Not on file   Transportation needs    Medical: Not on file    Non-medical: Not on file  Tobacco Use   Smoking status: Never Smoker   Smokeless tobacco: Never Used  Substance and Sexual Activity   Alcohol use: Yes   Drug use: No   Sexual activity: Not Currently  Lifestyle   Physical activity    Days per week: Not on file    Minutes per session: Not on file   Stress: Not on file  Relationships   Social connections    Talks on phone: Not on file    Gets together: Not on file    Attends religious service: Not on file    Active member of club or organization: Not on file    Attends meetings of clubs or organizations: Not on file    Relationship status: Not on file  Other Topics Concern   Not on file  Social History Narrative   Not on file   Family History  Problem Relation Age of Onset   Diabetes Mother    Heart disease Father    Heart disease Brother    Diabetes Brother    Scheduled Meds:  feeding supplement (PRO-STAT SUGAR FREE 64)  30 mL Oral TID BM   folic acid  1 mg Oral Daily   heparin  5,000 Units Subcutaneous Q8H   insulin aspart  0-5 Units Subcutaneous QHS   insulin aspart  0-9 Units Subcutaneous TID WC   midodrine  10 mg Oral TID WC   multivitamin with minerals  1 tablet Oral Daily   sodium bicarbonate  1,300 mg Oral BID   tamsulosin  0.4 mg Oral Daily   thiamine  100 mg Oral Daily   Continuous Infusions: PRN Meds:.lidocaine (PF) No Known Allergies  Vital Signs: BP (!) 107/49 (BP Location: Right Arm)    Pulse 78    Temp 97.9 F (36.6 C) (Oral)    Resp 15     Wt 86.8 kg    SpO2 98%    BMI 28.67 kg/m  Pain Scale: 0-10   Pain Score: Asleep  SpO2: SpO2: 98 % O2 Device:SpO2: 98 % O2 Flow Rate: .   IO: Intake/output summary:   Intake/Output Summary (Last 24 hours) at 01/15/2019 1416 Last data filed at 01/15/2019 1400 Gross per 24 hour  Intake 360 ml  Output 765 ml  Net -405 ml    LBM: Last BM Date: 01/14/19 Baseline Weight: Weight: 92.3 kg Most recent weight: Weight: 86.8 kg     Palliative Assessment/Data: PPS 40%     The above conversation was completed via telephone due to the visitor restrictions during the COVID-19 pandemic. Thorough chart review and discussion with necessary members of the care team was completed as part of assessment. All issues were discussed and addressed but no physical exam was performed.  Time Total: 85 minutes Greater than 50%  of this time was spent counseling and coordinating care related to the above assessment and plan.  Juel Burrow, DNP, AGNP-C Palliative Medicine Team [phone] Pager: [phone]

## 2019-01-15 NOTE — Progress Notes (Signed)
Pt had 12 beats svt per ccmd.  trh notified via [place]. Pt asymptomatic.

## 2019-01-16 DIAGNOSIS — R609 Edema, unspecified: Secondary | ICD-10-CM | POA: Diagnosis not present

## 2019-01-16 DIAGNOSIS — Z515 Encounter for palliative care: Secondary | ICD-10-CM | POA: Diagnosis not present

## 2019-01-16 DIAGNOSIS — N179 Acute kidney failure, unspecified: Secondary | ICD-10-CM | POA: Diagnosis not present

## 2019-01-16 DIAGNOSIS — R601 Generalized edema: Secondary | ICD-10-CM | POA: Diagnosis not present

## 2019-01-16 DIAGNOSIS — Z7189 Other specified counseling: Secondary | ICD-10-CM | POA: Diagnosis not present

## 2019-01-16 LAB — CBC
HCT: 32.4 % — ABNORMAL LOW (ref 39.0–52.0)
Hemoglobin: 11.4 g/dL — ABNORMAL LOW (ref 13.0–17.0)
MCH: 33.8 pg (ref 26.0–34.0)
MCHC: 35.2 g/dL (ref 30.0–36.0)
MCV: 96.1 fL (ref 80.0–100.0)
Platelets: 106 10*3/uL — ABNORMAL LOW (ref 150–400)
RBC: 3.37 MIL/uL — ABNORMAL LOW (ref 4.22–5.81)
RDW: 14.6 % (ref 11.5–15.5)
WBC: 8.7 10*3/uL (ref 4.0–10.5)
nRBC: 0 % (ref 0.0–0.2)

## 2019-01-16 LAB — COMPREHENSIVE METABOLIC PANEL
ALT: 60 U/L — ABNORMAL HIGH (ref 0–44)
AST: 102 U/L — ABNORMAL HIGH (ref 15–41)
Albumin: 2.3 g/dL — ABNORMAL LOW (ref 3.5–5.0)
Alkaline Phosphatase: 285 U/L — ABNORMAL HIGH (ref 38–126)
Anion gap: 15 (ref 5–15)
BUN: 97 mg/dL — ABNORMAL HIGH (ref 8–23)
CO2: 14 mmol/L — ABNORMAL LOW (ref 22–32)
Calcium: 8.6 mg/dL — ABNORMAL LOW (ref 8.9–10.3)
Chloride: 112 mmol/L — ABNORMAL HIGH (ref 98–111)
Creatinine, Ser: 4.04 mg/dL — ABNORMAL HIGH (ref 0.61–1.24)
GFR calc Af Amer: 15 mL/min — ABNORMAL LOW (ref >60)
GFR calc non Af Amer: 13 mL/min — ABNORMAL LOW (ref >60)
Glucose, Bld: 121 mg/dL — ABNORMAL HIGH (ref 70–99)
Potassium: 4.5 mmol/L (ref 3.5–5.1)
Sodium: 141 mmol/L (ref 135–145)
Total Bilirubin: 1.1 mg/dL (ref 0.3–1.2)
Total Protein: 5.3 g/dL — ABNORMAL LOW (ref 6.5–8.1)

## 2019-01-16 LAB — GLUCOSE, CAPILLARY
Glucose-Capillary: 128 mg/dL — ABNORMAL HIGH (ref 70–99)
Glucose-Capillary: 130 mg/dL — ABNORMAL HIGH (ref 70–99)
Glucose-Capillary: 129 mg/dL — ABNORMAL HIGH (ref 70–99)

## 2019-01-16 LAB — MAGNESIUM: Magnesium: 2.3 mg/dL (ref 1.7–2.4)

## 2019-01-16 NOTE — TOC Progression Note (Addendum)
Transition of Care [facility]) - Progression Note    Patient Details  Name: Daniel [name] MRN: [id number] Date of Birth: November 23, 1933  Transition of Care [facility]) CM/SW Contact  Zenon Mayo, RN Phone Number: 01/16/2019, 2:26 PM  Clinical Narrative:    SNF has been declined by [facility] for patient.  Palliative has been meeting with family, most likely may go back to ALF with Hospice  NCM awaiting decision.  Per Palliative , family has decided for patient to go back to ALF with Hospice. NCM spoke with Phineas Semen at [facility] , she states they work with [facility].  She states she needs new FL2, new documentation, med list, faxed to [phone].  Notified MD to resign FL2.  Also NCM contacted Sheree with [facility] for referral, she states she will contact the son [name].  NCM will fax documents once FL2 is signed.   Expected Discharge Plan: Skilled Nursing Facility Barriers to Discharge: No Barriers Identified  Expected Discharge Plan and Services Expected Discharge Plan: [place] In-house Referral: NA Discharge Planning Services: CM Consult   Living arrangements for the past 2 months: Assisted Living Facility                 DME Arranged: (NA)         HH Arranged: NA           Social Determinants of Health (SDOH) Interventions    Readmission Risk Interventions Readmission Risk Prevention Plan 01/12/2019  Transportation Screening Complete  HRI or [place] Complete  Social Work Consult for [place] Planning/Counseling Complete  Palliative Care Screening Not Applicable  Medication Review Press photographer) Complete  Some recent data might be hidden

## 2019-01-16 NOTE — Progress Notes (Signed)
[facility]    NEPHROLOGY PROGRESS NOTE  SUBJECTIVE: Patient awake on exam, confused, unable to provide any other associated history.  Palliative care discussion noted.   OBJECTIVE:  Vitals:   01/16/19 0052 01/16/19 0516  BP: (!) 109/54 (!) 126/52  Pulse: 78 81  Resp: 16 16  Temp: 97.6 F (36.4 C) (!) 97.5 F (36.4 C)  SpO2: 100% 97%    Intake/Output Summary (Last 24 hours) at 01/16/2019 1555 Last data filed at 01/16/2019 0533 Gross per 24 hour  Intake 240 ml  Output 500 ml  Net -260 ml      General: Awake, not alert to self, no acute distress  HEENT: MMM [place] AT anicteric sclera Neck:  No JVD, no adenopathy CV:  Heart RRR  Lungs:  L/S CTA bilaterally Abd:  abd distended with a fluid wave with normal BS GU:  Bladder non-palpable Extremities: +1 bilateral lower extremity edema Skin:  No skin rash  MEDICATIONS:  . feeding supplement (PRO-STAT SUGAR FREE 64)  30 mL Oral TID BM  . folic acid  1 mg Oral Daily  . heparin  5,000 Units Subcutaneous Q8H  . insulin aspart  0-5 Units Subcutaneous QHS  . insulin aspart  0-9 Units Subcutaneous TID WC  . midodrine  10 mg Oral TID WC  . multivitamin with minerals  1 tablet Oral Daily  . sodium bicarbonate  1,300 mg Oral BID  . tamsulosin  0.4 mg Oral Daily  . thiamine  100 mg Oral Daily       LABS:   CBC Latest Ref Rng & Units 01/16/2019 01/15/2019 01/14/2019  WBC 4.0 - 10.5 K/uL 8.7 8.8 8.1  Hemoglobin 13.0 - 17.0 g/dL 11.4(L) 10.7(L) 10.8(L)  Hematocrit 39.0 - 52.0 % 32.4(L) 30.7(L) 31.2(L)  Platelets 150 - 400 K/uL 106(L) 131(L) 146(L)    CMP Latest Ref Rng & Units 01/16/2019 01/15/2019 01/14/2019  Glucose 70 - 99 mg/dL 121(H) 114(H) 141(H)  BUN 8 - 23 mg/dL 97(H) 94(H) 89(H)  Creatinine 0.61 - 1.24 mg/dL 4.04(H) 4.03(H) 4.02(H)  Sodium 135 - 145 mmol/L 141 141 140  Potassium 3.5 - 5.1 mmol/L 4.5 4.0 4.3  Chloride 98 - 111 mmol/L 112(H) 111 111  CO2 22 - 32 mmol/L 14(L) 18(L) 18(L)  Calcium 8.9  - 10.3 mg/dL 8.6(L) 8.8(L) 8.7(L)  Total Protein 6.5 - 8.1 g/dL 5.3(L) - 5.2(L)  Total Bilirubin 0.3 - 1.2 mg/dL 1.1 - 1.2  Alkaline Phos 38 - 126 U/L 285(H) - 316(H)  AST 15 - 41 U/L 102(H) - 91(H)  ALT 0 - 44 U/L 60(H) - 57(H)    Lab Results  Component Value Date   CALCIUM 8.6 (L) 01/16/2019   CAION 1.16 01/23/2018   PHOS 4.8 (H) 01/15/2019       Component Value Date/Time   COLORURINE YELLOW 01/11/2019 0538   APPEARANCEUR CLEAR 01/11/2019 0538   LABSPEC 1.010 01/11/2019 0538   PHURINE 5.0 01/11/2019 0538   GLUCOSEU NEGATIVE 01/11/2019 0538   HGBUR NEGATIVE 01/11/2019 0538   BILIRUBINUR NEGATIVE 01/11/2019 0538   KETONESUR NEGATIVE 01/11/2019 0538   PROTEINUR NEGATIVE 01/11/2019 0538   UROBILINOGEN 1.0 04/17/2013 1014   NITRITE NEGATIVE 01/11/2019 0538   LEUKOCYTESUR NEGATIVE 01/11/2019 0538      Component Value Date/Time   PHART 7.445 06/17/2017 0415   PCO2ART 27.5 (L) 06/17/2017 0415   PO2ART 154.0 (H) 06/17/2017 0415   HCO3 18.9 (L) 06/17/2017 0415   TCO2 15 (L) 01/23/2018 1430   ACIDBASEDEF 4.0 (  H) 06/17/2017 0415   O2SAT 99.0 06/17/2017 0415    No results found for: IRON, TIBC, FERRITIN, IRONPCTSAT     ASSESSMENT/PLAN:    85 year old white male nursing home resident with multiple medical issues including some stage III CKD and dementia. He presents with volume overload and acute on chronic renal failure 1.Renal-acute on chronic renal failure (CKD stage III with baseline 1.4 in August of 2020)  in the setting of cirrhosis, decreased ejection fraction, and volume overload. He was on Cozaar as an outpatient and blood pressure is soft. That along with a fairly normal renal ultrasound and a bland urinalysis suggests that this acute kidney injury has been hemodynamically mediated. Either by cardiorenal syndrome, hepatorenal syndrome or just hypotension on an ARB. ARB has been held. He is no longer on any antihypertensives. Urine sodium not consistent with  hepatorenal. Not a candidate for dialysis therapy given his chronic debilitated state, age and presumed dementia.  Off furosemide for now with minimal intake.  Renal function relatively stable.   2. Hypertension/volume-volume overloaded with a soft blood pressure. All antihypertensives have been stopped. Stopped furosemide for worsening renal function 3.Electrolytes-potassium improved.  Discontinue KCl. 4.Metabolic acidosis-in the setting of acute kidney injury. Will cont sodium bicarb which is already dosed well for his size.   4. Anemia-is not significant at this time nor does it require treatment at this time  Agree with hospice.  I do not have much more to add to his care.  Will be available as needed.  Please call with questions.  Thanks.     [place], DO, [place]

## 2019-01-16 NOTE — Progress Notes (Signed)
Per CCMd pt had 23 beat of vtach.  TRH on call notified.  Pt asymptomatic.

## 2019-01-16 NOTE — Progress Notes (Signed)
Palliative Medicine RN Note: Went to see patient per request of PMT NP Kathie Rhodes. Daniel [name] is awake and alert. He is oriented to self only. His only spontaneous speech is to say "You're cheerful" and "you're pretty." He answers all questions with "Yes." I called Daniel [name] via FaceTime during my visit so she could ask him questions also.  Marjie Skiff Marquelle Balow, RN, BSN, [facility] Palliative Medicine Team 01/16/2019 11:05 AM Office [phone]

## 2019-01-16 NOTE — Care Management Important Message (Signed)
Important Message  Patient Details  Name: Daniel [name] MRN: [id number] Date of Birth: [birth year]   Medicare Important Message Given:  Yes     [name] 01/16/2019, 12:48 PM

## 2019-01-16 NOTE — Progress Notes (Signed)
PROGRESS NOTE    Daniel [name]  IWO:[id number] DOB: Mar 16, 1934 DOA: 01/10/2019 PCP: Jani Gravel, MD   Brief Narrative:  Daniel [name] is Daniel [name] 85 y.o. male with medical history significant of liver cirrhosis, alcohol dependence, CHF, CAD status post stent placement, type 2 diabetes, CKD stage III, hypertension, hyperlipidemia, peripheral neuropathy, seizures, alcohol abuse, prostate cancer status post radioactive seed implant presenting to the hospital via EMS from his nursing home for evaluation of bilateral lower extremity edema extending up to his abdomen.  Patient is not sure why he is here.  He has no complaints.  Denies shortness of breath, orthopnea, or lower extremity edema.  Denies chest pain.  States his appetite is good.  No additional history could be obtained from him.  ED Course: Bradycardic with heart rate ranging from the 40s to 60s.  Not hypotensive.  Not tachypneic or hypoxic.  No leukocytosis.  Bicarb 17, anion gap 12.  Bicarb mildly low on labs done Daniel [name] month ago as well.  BUN 80, creatinine 3.6.  Baseline creatinine 1.3.  LFTs elevated (AST 117, ALT 69, alk phos 373).  LFTs were elevated on labs done Daniel [name] month ago as well.  High-sensitivity troponin 55.  EKG without acute ischemic changes.  BNP 181.  SARS-CoV-2 test pending. Patient received IV Lasix 40 mg.  Assessment & Plan:   Principal Problem:   Edema Active Problems:   Alcohol dependence (HCC)   Type II diabetes mellitus with renal manifestations (HCC)   AKI (acute kidney injury) (HCC)   Bradycardia   Anasarca   Goals of care, counseling/discussion   Palliative care by specialist  Anasarca  Volume Overload  Cirrhosis: Patient has significant bilateral lower extremity edema extending up to the abdomen.  Does have Daniel [name] history of CHF and last echo done in July 2020 with LVEF 45 to 50% and left ventricular diastolic parameters consistent with pseudonormalization.  However, BNP not significantly elevated.  Chest x-ray  showing moderate left pleural effusion but no overt pulmonary edema.  Patient has no complaints of dyspnea or orthopnea.  Not tachypneic or hypoxic.  Suspect his edema is related to decompensated liver cirrhosis.  Albumin is low at 2.1. Ultrasound with evidence of hepatic cirrhosis and moderate volume ascites.  -Appreciate renal assistance - was on midodrine and lasix for diuresis.  Lasix now d/c'd with worsening renal function.  Palliative care c/s has been requested - family discussing options.  Pt may return to ALF with hospice.    -paracentesis 10/22 with 2 L off - no evidence of SBP - follow cx (NGTD) -unable to start spironolactone right now due to AKI  -consider therapeutic paracentesis +/- thoracentesis as well pending course -Monitor intake and output, daily weights, low-sodium diet with fluid restriction  AKI on CKD 3  NAGMA BUN 80, creatinine 3.6.  Baseline creatinine 1.3.  AKI likely related to volume overload, intravascularly dry with hypoalbuminemia, possibly related to diuresis as well (apparently per son, he was being managed at nursing facility for volume overload and may have been getting higher doses of lasix?).  Hepatorenal should be on differential as well with his cirrhosis.  -creatinine worsened today to 4.02 - Appreciate renal assistance -> lasix has been discontinued.  Continue midodrine.  Recommended palliative care c/s (as above).  Bicarb supplementation. - urine is bland, follow urine lytes (urine Na not suggestive of HRS) - bicarb - I/O, daily weights - Monitor urine output - Avoid nephrotoxic agents/contrast  Dementia  Concern for Acute Metabolic  Encephalopathy: this does not appear to be far from his baseline.  Has been similar throughout admission and seems to be at baseline per discussion with family.  Will check ammonia (wnl) to see if he may benefit from lactulose.   Delirium precautions  Urinary Retention?: ? Of low UOP yesterday during day and was I/O  cath'd.  Difficult to use bladder scan accurately due to ascites.  Had 400 out yesterday with I/O cath.  Will need to follow closely UOP.  May need repeat I/O cath vs foley placement.  Dysphagia: per nursing, pt pocketing, needs Daniel [name] lot of coaching to take meds and swallow.  Will have speech eval.  Sinus bradycardia Bradycardic with heart rate ranging from the 40s to 60s.  Not hypotensive.  Asymptomatic.  Not on any AV nodal blocking agent. -Check TSH, free T4 levels (T4 elevated) - mildly abnormal - follow repeat outpatient  -Cardiac monitoring  NSVT: continue to monitor for now  Normal anion gap metabolic acidosis Likely related to AKI. -Continue to monitor  Mildly elevated troponin High-sensitivity troponin 55 >49.  Troponin previously mildly elevated as well.  EKG without acute ischemic changes.  Patient has no complaints of chest pain and appears comfortable. -Cardiac monitoring  Insulin-dependent diabetes mellitus A1c 6.6 on 7/23. -Sliding scale insulin sensitive and CBG checks - BG's reasonable   History of alcohol dependence Patient is currently living in [name] nursing home.  No signs of withdrawal. -CIWA protocol; Ativan as needed -Thiamine, folate, multivitamin  Anemia: continue to monitor, no si/sx of bleeding.  Stable, follow  Poor PO intake: will c/s dietitian   Thrombocytopenia: continue to monitor, some chronicity to this.  Possibly related to cirrhosis.  CTM.  Did peer to peer today.  DVT prophylaxis: heparin  Code Status: DNR Family Communication: discussed with son at bedside on 10/25 Disposition Plan: pending further improvement - need goals of care discussion - palliative has been consulted.    Consultants:   IR  Nephrology  Procedures:   IR paracentesis 10/22  Antimicrobials:  Anti-infectives (From admission, onward)   None     Subjective: No complaints.  Doesn't say much today. Denies discomfort.  Objective: Vitals:   01/15/19 1127  01/15/19 1952 01/16/19 0052 01/16/19 0516  BP: (!) 107/49 (!) 113/52 (!) 109/54 (!) 126/52  Pulse: 78 73 78 81  Resp: '15 16 16 16  ' Temp: 97.9 F (36.6 C) 97.6 F (36.4 C) 97.6 F (36.4 C) (!) 97.5 F (36.4 C)  TempSrc: Oral Oral Oral Oral  SpO2: 98% 99% 100% 97%  Weight:    85.5 kg    Intake/Output Summary (Last 24 hours) at 01/16/2019 1359 Last data filed at 01/16/2019 0533 Gross per 24 hour  Intake 600 ml  Output 650 ml  Net -50 ml   Filed Weights   01/14/19 0318 01/15/19 0533 01/16/19 0516  Weight: 85.2 kg 86.8 kg 85.5 kg    Examination:  General: No acute distress. Cardiovascular: Heart sounds show Jimmey Hengel regular rate, and rhythm.  Lungs: Clear to auscultation bilaterally  Abdomen: Soft, nontender, distended Neurological: Alert, but disoriented. Moves all extremities 4 with equal strength. Cranial nerves II through XII grossly intact. Skin: Warm and dry. No rashes or lesions. Extremities: bilateral LE edema    Data Reviewed: I have personally reviewed following labs and imaging studies  CBC: Recent Labs  Lab 01/10/19 2132 01/12/19 0418 01/12/19 2024 01/13/19 0439 01/14/19 0448 01/15/19 0435 01/16/19 0523  WBC 7.9 7.5  --  8.2 8.1  8.8 8.7  NEUTROABS 4.5  --   --   --   --   --   --   HGB 12.4* 10.4* 11.2* 10.7* 10.8* 10.7* 11.4*  HCT 37.5* 29.6* 31.8* 30.7* 31.2* 30.7* 32.4*  MCV 99.5 94.3  --  95.3 96.0 95.6 96.1  PLT 205 150  --  164 146* 131* 222*   Basic Metabolic Panel: Recent Labs  Lab 01/12/19 0418 01/13/19 0439 01/14/19 0448 01/15/19 0435 01/16/19 0523  NA 136 137 140 141 141  K 3.8 4.1 4.3 4.0 4.5  CL 109 108 111 111 112*  CO2 15* 16* 18* 18* 14*  GLUCOSE 89 95 141* 114* 121*  BUN 82* 84* 89* 94* 97*  CREATININE 3.82* 3.90* 4.02* 4.03* 4.04*  CALCIUM 8.4* 8.7* 8.7* 8.8* 8.6*  MG 2.3 2.3 2.4 2.3 2.3  PHOS  --  5.0* 4.9* 4.8*  --    GFR: Estimated Creatinine Clearance: 14.9 mL/min (Nicholaus Steinke) (by C-G formula based on SCr of 4.04 mg/dL (H)).  Liver Function Tests: Recent Labs  Lab 01/11/19 0344 01/12/19 0418 01/13/19 0439 01/14/19 0448 01/15/19 0435 01/16/19 0523  AST 94* 85* 82* 91*  --  102*  ALT 56* 53* 51* 57*  --  60*  ALKPHOS 304* 313* 290* 316*  --  285*  BILITOT 1.0 1.1 1.5* 1.2  --  1.1  PROT 4.9* 5.0* 5.1* 5.2*  --  5.3*  ALBUMIN 2.1* 2.4* 2.4* 2.4*  2.3* 2.3* 2.3*   No results for input(s): LIPASE, AMYLASE in the last 168 hours. Recent Labs  Lab 01/15/19 1450  AMMONIA 17   Coagulation Profile: Recent Labs  Lab 01/10/19 2132  INR 1.2   Cardiac Enzymes: No results for input(s): CKTOTAL, CKMB, CKMBINDEX, TROPONINI in the last 168 hours. BNP (last 3 results) No results for input(s): PROBNP in the last 8760 hours. HbA1C: No results for input(s): HGBA1C in the last 72 hours. CBG: Recent Labs  Lab 01/15/19 0624 01/15/19 1124 01/15/19 1716 01/15/19 2107 01/16/19 0607  GLUCAP 113* 121* 137* 139* 128*   Lipid Profile: No results for input(s): CHOL, HDL, LDLCALC, TRIG, CHOLHDL, LDLDIRECT in the last 72 hours. Thyroid Function Tests: No results for input(s): TSH, T4TOTAL, FREET4, T3FREE, THYROIDAB in the last 72 hours. Anemia Panel: No results for input(s): VITAMINB12, FOLATE, FERRITIN, TIBC, IRON, RETICCTPCT in the last 72 hours. Sepsis Labs: No results for input(s): PROCALCITON, LATICACIDVEN in the last 168 hours.  Recent Results (from the past 240 hour(s))  SARS CORONAVIRUS 2 (TAT 6-24 HRS) Nasopharyngeal Nasopharyngeal Swab     Status: None   Collection Time: 01/10/19 11:35 PM   Specimen: Nasopharyngeal Swab  Result Value Ref Range Status   SARS Coronavirus 2 NEGATIVE NEGATIVE Final    Comment: (NOTE) SARS-CoV-2 target nucleic acids are NOT DETECTED. The SARS-CoV-2 RNA is generally detectable in upper and lower respiratory specimens during the acute phase of infection. Negative results do not preclude SARS-CoV-2 infection, do not rule out co-infections with other pathogens, and should  not be used as the sole basis for treatment or other patient management decisions. Negative results must be combined with clinical observations, patient history, and epidemiological information. The expected result is Negative. Fact Sheet for Patients: SugarRoll.be Fact Sheet for Healthcare Providers: https://www.woods-mathews.com/ This test is not yet approved or cleared by the [place] FDA and  has been authorized for detection and/or diagnosis of SARS-CoV-2 by FDA under an Emergency Use Authorization (EUA). This EUA will remain  in effect (  meaning this test can be used) for the duration of the COVID-19 declaration under Section 56 4(b)(1) of the Act, 21 U.S.C. section 360bbb-3(b)(1), unless the authorization is terminated or revoked sooner. Performed at [facility], [place] [address] [place], [place] [address]   Gram stain     Status: None   Collection Time: 01/12/19  2:24 PM   Specimen: Abdomen; Peritoneal Fluid  Result Value Ref Range Status   Specimen Description PERITONEAL  Final   Special Requests NONE  Final   Gram Stain   Final    ABUNDANT WBC PRESENT, PREDOMINANTLY MONONUCLEAR NO ORGANISMS SEEN Performed at [facility], [place] [address] [place], [place] [address]    Report Status 01/12/2019 FINAL  Final  Culture, body fluid-bottle     Status: None (Preliminary result)   Collection Time: 01/12/19  2:24 PM   Specimen: Peritoneal Washings  Result Value Ref Range Status   Specimen Description PERITONEAL  Final   Special Requests NONE  Final   Culture   Final    NO GROWTH 4 DAYS Performed at [facility] [address] [place], [place] [address]    Report Status PENDING  Incomplete         Radiology Studies: No results found.      Scheduled Meds: . feeding supplement (PRO-STAT SUGAR FREE 64)  30 mL Oral TID BM  . folic acid  1 mg Oral Daily  . heparin  5,000 Units Subcutaneous Q8H  .  insulin aspart  0-5 Units Subcutaneous QHS  . insulin aspart  0-9 Units Subcutaneous TID WC  . midodrine  10 mg Oral TID WC  . multivitamin with minerals  1 tablet Oral Daily  . sodium bicarbonate  1,300 mg Oral BID  . tamsulosin  0.4 mg Oral Daily  . thiamine  100 mg Oral Daily   Continuous Infusions:    LOS: 6 days    Time spent: over 30 min    Fayrene Helper, MD [facility] Pager AMION  If 7PM-7AM, please contact night-coverage [email or web address] Password [email or web address] 01/16/2019, 1:59 PM

## 2019-01-16 NOTE — Progress Notes (Signed)
Daily Progress Note   Patient Name: Daniel [name]       Date: 01/16/2019 DOB: 1933/03/26  Age: 85 y.o. MRN#: [id number] Attending Physician: Elodia Florence., * Primary Care Physician: Jani Gravel, MD Admit Date: 01/10/2019  Reason for Consultation/Follow-up: Establishing goals of care  Subjective: Spoke with patient over video chat this AM facilitated by PMT RN. Patient was alert and interactive, very pleasant. He was disoriented - did not know he was in the hospital and when we discussed that he was he tells me he had surgery. Attempted to discuss goals of care per son's wishes however patient was unable to participate in this conversation. Eating small amounts. Creatinine remains elevated but not rising.   Length of Stay: 6  Current Medications: Scheduled Meds:  . feeding supplement (PRO-STAT SUGAR FREE 64)  30 mL Oral TID BM  . folic acid  1 mg Oral Daily  . heparin  5,000 Units Subcutaneous Q8H  . insulin aspart  0-5 Units Subcutaneous QHS  . insulin aspart  0-9 Units Subcutaneous TID WC  . midodrine  10 mg Oral TID WC  . multivitamin with minerals  1 tablet Oral Daily  . sodium bicarbonate  1,300 mg Oral BID  . tamsulosin  0.4 mg Oral Daily  . thiamine  100 mg Oral Daily    Continuous Infusions:   PRN Meds: lidocaine (PF)    Vital Signs: BP (!) 126/52 (BP Location: Right Arm)   Pulse 81   Temp (!) 97.5 F (36.4 C) (Oral)   Resp 16   Wt 85.5 kg   SpO2 97%   BMI 28.24 kg/m  SpO2: SpO2: 97 % O2 Device: O2 Device: Room Air O2 Flow Rate:    Intake/output summary:   Intake/Output Summary (Last 24 hours) at 01/16/2019 1506 Last data filed at 01/16/2019 0533 Gross per 24 hour  Intake 480 ml  Output 500 ml  Net -20 ml   LBM: Last BM Date: 01/14/19 Baseline Weight:  Weight: 92.3 kg Most recent weight: Weight: 85.5 kg       Palliative Assessment/Data: PPS 40%    Flowsheet Rows     Most Recent Value  Intake Tab  Referral Department  Hospitalist  Unit at Time of Referral  Cardiac/Telemetry Unit  Palliative Care Primary Diagnosis  Other (Comment) [liver cirrhosis]  Date Notified  01/14/19  Palliative Care Type  New Palliative care  Reason for referral  Clarify Goals of Care  Date of Admission  01/10/19  Date first seen by Palliative Care  01/15/19  # of days Palliative referral response time  1 Day(s)  # of days IP prior to Palliative referral  4  Clinical Assessment  Palliative Performance Scale Score  40%  Psychosocial & Spiritual Assessment  Palliative Care Outcomes  Patient/Family meeting held?  Yes  Who was at the meeting?  son  Palliative Care Outcomes  Clarified goals of care, Counseled regarding hospice, Provided psychosocial or spiritual support      Patient Active Problem List   Diagnosis Date Noted  . Anasarca   . Goals of care, counseling/discussion   . Palliative care by specialist   . AKI (acute kidney injury) ([place])  01/11/2019  . Bradycardia 01/11/2019  . Edema 01/10/2019  . Evaluation by psychiatric service required   . Acute metabolic encephalopathy 35/00/9381  . Gait instability 10/12/2018  . HLD (hyperlipidemia) 10/12/2018  . Elevated troponin 10/12/2018  . Hypoglycemia 07/19/2017  . Hypotension 07/19/2017  . Normochromic normocytic anemia 07/19/2017  . Thrombocytopenia ([place]) 07/19/2017  . CKD (chronic kidney disease), stage III 07/19/2017  . Seizure ([place]) 06/16/2017  . Type II diabetes mellitus with neurological manifestations ([place]) 03/05/2015  . Porokeratosis 03/05/2015  . Chronic systolic CHF (congestive heart failure) ([place]) 01/28/2015  . Coronary artery disease 01/28/2015  . Bruit of left carotid artery 01/28/2015  . 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 H/O detached retina repair  08/23/2014  . Acute on chronic renal failure ([place]) 04/17/2013  . Dehydration 04/17/2013  . Alcohol dependence ([place]) 04/17/2013  . Transaminasemia 04/17/2013  . Type II diabetes mellitus with renal manifestations ([place]) 04/17/2013  . Near syncope 04/17/2013  . UTI (lower urinary tract infection) 04/17/2013  . Hematuria 04/17/2013    Palliative Care Assessment & Plan   HPI: 85 y.o. male  with past medical history of liver cirrhosis, alcohol dependence, CHF, CAD s/p stent, T2DM, CKD3, HTN, HLD, peripheral neuropathy, seizures, and prostate cancer admitted on 01/10/2019 with increasing bilateral lower extremity edema extending into abdomen.  Edema is attributed to decompensated liver cirrhosis. Albumin is 2.1. Was started on lasix, but this was d/c'd d/t worsening renal function. Creatinine up to 4.02, baseline is 1.3. Patient minimally interactive with poor PO intake. PMT consulted for [place].   Assessment: As above - attempted to discuss [place] with patient per son's request - patient was unable to discuss any sort of decisions - though he was alert and pleasant.  Called and spoke with son, [name] - he is disappointed that patient could not further participate in decision making. He tells me he continues to believe that hospice at ALF is most appropriate - he would like to focus on patient's comfort and being close to family instead of in rehab d/t poor prognosis. He requests time to discuss this with his mother and brother.  We spoke later in the day and son confirms that all family has agreed to hospice at ALF. Also, insurance has not approved patient for rehab.   Will notify care team of family's decision.   Recommendations/Plan:  Return to ALF with hospice  Care should be focused on quality of life/comfort - avoid aggressive medical interventions  Code Status:  DNR  Prognosis:   < 6 months  Discharge Planning:  ALF with hospice  Care plan was discussed with Dr. Florene Glen, case  manager, patient's son, patient  Thank you for allowing the Palliative Medicine Team to assist in the care of this patient.  The above conversation was completed via telephone due to the visitor restrictions during the COVID-19 pandemic. Thorough chart review and discussion with necessary members of the care team was completed as part of assessment. All issues were discussed and addressed but no physical exam was performed.   Total Time 35 minutes Prolonged Time Billed  no       Greater than 50%  of this time was spent counseling and coordinating care related to the above assessment and plan.  Juel Burrow, DNP, [facility] Palliative Medicine Team Team Phone # [phone]  Pager [phone]

## 2019-01-17 DIAGNOSIS — Z515 Encounter for palliative care: Secondary | ICD-10-CM | POA: Diagnosis not present

## 2019-01-17 DIAGNOSIS — R601 Generalized edema: Secondary | ICD-10-CM | POA: Diagnosis not present

## 2019-01-17 DIAGNOSIS — Z7189 Other specified counseling: Secondary | ICD-10-CM | POA: Diagnosis not present

## 2019-01-17 DIAGNOSIS — N179 Acute kidney failure, unspecified: Secondary | ICD-10-CM | POA: Diagnosis not present

## 2019-01-17 DIAGNOSIS — R609 Edema, unspecified: Secondary | ICD-10-CM | POA: Diagnosis not present

## 2019-01-17 LAB — COMPREHENSIVE METABOLIC PANEL
ALT: 66 U/L — ABNORMAL HIGH (ref 0–44)
AST: 100 U/L — ABNORMAL HIGH (ref 15–41)
Albumin: 2.4 g/dL — ABNORMAL LOW (ref 3.5–5.0)
Alkaline Phosphatase: 300 U/L — ABNORMAL HIGH (ref 38–126)
Anion gap: 11 (ref 5–15)
BUN: 99 mg/dL — ABNORMAL HIGH (ref 8–23)
CO2: 18 mmol/L — ABNORMAL LOW (ref 22–32)
Calcium: 8.8 mg/dL — ABNORMAL LOW (ref 8.9–10.3)
Chloride: 111 mmol/L (ref 98–111)
Creatinine, Ser: 3.73 mg/dL — ABNORMAL HIGH (ref 0.61–1.24)
GFR calc Af Amer: 16 mL/min — ABNORMAL LOW (ref >60)
GFR calc non Af Amer: 14 mL/min — ABNORMAL LOW (ref >60)
Glucose, Bld: 139 mg/dL — ABNORMAL HIGH (ref 70–99)
Potassium: 4.1 mmol/L (ref 3.5–5.1)
Sodium: 140 mmol/L (ref 135–145)
Total Bilirubin: 1.5 mg/dL — ABNORMAL HIGH (ref 0.3–1.2)
Total Protein: 5.6 g/dL — ABNORMAL LOW (ref 6.5–8.1)

## 2019-01-17 LAB — GLUCOSE, CAPILLARY
Glucose-Capillary: 131 mg/dL — ABNORMAL HIGH (ref 70–99)
Glucose-Capillary: 150 mg/dL — ABNORMAL HIGH (ref 70–99)
Glucose-Capillary: 152 mg/dL — ABNORMAL HIGH (ref 70–99)
Glucose-Capillary: 125 mg/dL — ABNORMAL HIGH (ref 70–99)

## 2019-01-17 LAB — CULTURE, BODY FLUID W GRAM STAIN -BOTTLE: Culture: NO GROWTH

## 2019-01-17 LAB — CBC
HCT: 35 % — ABNORMAL LOW (ref 39.0–52.0)
Hemoglobin: 11.8 g/dL — ABNORMAL LOW (ref 13.0–17.0)
MCH: 33.3 pg (ref 26.0–34.0)
MCHC: 33.7 g/dL (ref 30.0–36.0)
MCV: 98.9 fL (ref 80.0–100.0)
Platelets: 121 10*3/uL — ABNORMAL LOW (ref 150–400)
RBC: 3.54 MIL/uL — ABNORMAL LOW (ref 4.22–5.81)
RDW: 14.5 % (ref 11.5–15.5)
WBC: 8.5 10*3/uL (ref 4.0–10.5)
nRBC: 0 % (ref 0.0–0.2)

## 2019-01-17 LAB — MRSA PCR SCREENING: MRSA by PCR: NEGATIVE

## 2019-01-17 LAB — MAGNESIUM: Magnesium: 2.3 mg/dL (ref 1.7–2.4)

## 2019-01-17 LAB — SARS CORONAVIRUS 2 (TAT 6-24 HRS): SARS Coronavirus 2: NEGATIVE

## 2019-01-17 MED ORDER — CHLORHEXIDINE GLUCONATE CLOTH 2 % EX PADS
6.0000 | MEDICATED_PAD | Freq: Every day | CUTANEOUS | Status: DC
  Administered 2019-01-17 – 2019-01-19 (×3): 6 via TOPICAL

## 2019-01-17 MED ORDER — LIDOCAINE HCL URETHRAL/MUCOSAL 2 % EX GEL
1.0000 "application " | Freq: Once | CUTANEOUS | Status: AC
  Administered 2019-01-17: 1 via URETHRAL
  Filled 2019-01-17: qty 20

## 2019-01-17 NOTE — Progress Notes (Signed)
PROGRESS NOTE    Daniel [name]  YYQ:[id number] DOB: [birth year] DOA: 01/10/2019 PCP: Daniel Gravel, MD   Brief Narrative:  Daniel [name] is Daniel [name] 85 y.o. male with medical history significant of liver cirrhosis, alcohol dependence, CHF, CAD status post stent placement, type 2 diabetes, CKD stage III, hypertension, hyperlipidemia, peripheral neuropathy, seizures, alcohol abuse, prostate cancer status post radioactive seed implant presenting to the hospital via EMS from his nursing home for evaluation of bilateral lower extremity edema extending up to his abdomen.  Patient is not sure why he is here.  He has no complaints.  Denies shortness of breath, orthopnea, or lower extremity edema.  Denies chest pain.  States his appetite is good.  No additional history could be obtained from him.  ED Course: Bradycardic with heart rate ranging from the 40s to 60s.  Not hypotensive.  Not tachypneic or hypoxic.  No leukocytosis.  Bicarb 17, anion gap 12.  Bicarb mildly low on labs done Daniel [name] month ago as well.  BUN 80, creatinine 3.6.  Baseline creatinine 1.3.  LFTs elevated (AST 117, ALT 69, alk phos 373).  LFTs were elevated on labs done Daniel [name] month ago as well.  High-sensitivity troponin 55.  EKG without acute ischemic changes.  BNP 181.  SARS-CoV-2 test pending. Patient received IV Lasix 40 mg.  He presented with worsening bilateral LE edema and acute kidney injury.  He was seen by nephrology who trialed midodrine and lasix.  His creatinine worsened and palliative care was c/s.  At this point, plan is for him to go back to ALF with hospice.    Assessment & Plan:   Principal Problem:   Edema Active Problems:   Alcohol dependence (HCC)   Type II diabetes mellitus with renal manifestations (HCC)   AKI (acute kidney injury) (HCC)   Bradycardia   Anasarca   Goals of care, counseling/discussion   Palliative care by specialist  Anasarca  Volume Overload  Cirrhosis: Patient has significant bilateral lower  extremity edema extending up to the abdomen.  Does have Daniel [name] history of CHF and last echo done in July 2020 with LVEF 45 to 50% and left ventricular diastolic parameters consistent with pseudonormalization.  However, BNP not significantly elevated.  Chest x-ray showing moderate left pleural effusion but no overt pulmonary edema.  Patient has no complaints of dyspnea or orthopnea.  Not tachypneic or hypoxic.  Suspect his edema is related to decompensated liver cirrhosis.  Albumin is low at 2.1. Ultrasound with evidence of hepatic cirrhosis and moderate volume ascites.  -Appreciate renal assistance - was on midodrine and lasix for diuresis.  Lasix now d/c'd with worsening renal function.  Palliative care c/s has been requested - family discussing options.  Pt may return to ALF with hospice.    -paracentesis 10/22 with 2 L off - no evidence of SBP - follow cx (NGTD) -unable to start spironolactone right now due to AKI  -consider therapeutic paracentesis +/- thoracentesis as well pending course -Monitor intake and output, daily weights, low-sodium diet with fluid restriction  AKI on CKD 3  NAGMA BUN 80, creatinine 3.6.  Baseline creatinine 1.3.  AKI likely related to volume overload, intravascularly dry with hypoalbuminemia, possibly related to diuresis as well (apparently per son, he was being managed at nursing facility for volume overload and may have been getting higher doses of lasix?).  Hepatorenal should be on differential as well with his cirrhosis.  -creatinine worsened today to 4.02 - Appreciate renal assistance -> lasix has  been discontinued.  Continue midodrine.  Recommended palliative care c/s (as above).  Bicarb supplementation. - urine is bland, follow urine lytes (urine Na not suggestive of HRS) - bicarb - I/O, daily weights - Monitor urine output - Avoid nephrotoxic agents/contrast  Dementia  Concern for Acute Metabolic Encephalopathy: this does not appear to be far from his baseline.   Has been similar throughout admission and seems to be at baseline per discussion with family.  Will check ammonia (wnl) to see if he may benefit from lactulose.   Delirium precautions  Urinary Retention: Foley placed today 10/27 due to urinary retention.  Flomax started.  May need to d/c with foley.  Will need f/u outpatient with hospice at ALF to see if this can be discontinued outpatient.   Depending how long it takes for discharge, consider trial of void here.  Dysphagia: per nursing, pt pocketing, needs Daniel [name] lot of coaching to take meds and swallow.  Will have speech eval -> dysphagia 3, mechanical soft  Sinus bradycardia Bradycardic with heart rate ranging from the 40s to 60s.  Not hypotensive.  Asymptomatic.  Not on any AV nodal blocking agent. -Check TSH, free T4 levels (T4 elevated) - mildly abnormal - follow repeat outpatient  -Cardiac monitoring  NSVT: continue to monitor for now  Normal anion gap metabolic acidosis Likely related to AKI. -Continue to monitor  Mildly elevated troponin High-sensitivity troponin 55 >49.  Troponin previously mildly elevated as well.  EKG without acute ischemic changes.  Patient has no complaints of chest pain and appears comfortable. -Cardiac monitoring  Insulin-dependent diabetes mellitus A1c 6.6 on 7/23. -Sliding scale insulin sensitive and CBG checks - BG's reasonable   History of alcohol dependence Patient is currently living in [name] nursing home.  No signs of withdrawal. -CIWA protocol; Ativan as needed -Thiamine, folate, multivitamin  Anemia: continue to monitor, no si/sx of bleeding.  Stable, follow  Poor PO intake: will c/s dietitian   Thrombocytopenia: continue to monitor, some chronicity to this.  Possibly related to cirrhosis.  CTM.  DVT prophylaxis: heparin  Code Status: DNR Family Communication: discussed with son at bedside on 10/25 Disposition Plan: pending further improvement - need goals of care discussion - palliative  has been consulted.    Consultants:   IR  Nephrology  Procedures:   IR paracentesis 10/22  Antimicrobials:  Anti-infectives (From admission, onward)   None     Subjective: No complaints today  Objective: Vitals:   01/16/19 2108 01/17/19 0513 01/17/19 1117 01/17/19 1953  BP: (!) 131/54 (!) 117/56 (!) 115/51 131/60  Pulse: 77 77 78 87  Resp: '14 18 18 16  ' Temp: (!) 97.4 F (36.3 C) (!) 97.5 F (36.4 C) (!) 97.2 F (36.2 C) (!) 97.5 F (36.4 C)  TempSrc: Oral Oral Oral Oral  SpO2: 98% 98% 98% 100%  Weight:  85.3 kg      Intake/Output Summary (Last 24 hours) at 01/17/2019 2036 Last data filed at 01/17/2019 1852 Gross per 24 hour  Intake 1560 ml  Output 600 ml  Net 960 ml   Filed Weights   01/15/19 0533 01/16/19 0516 01/17/19 0513  Weight: 86.8 kg 85.5 kg 85.3 kg    Examination:  General: No acute distress. Cardiovascular: Heart sounds show Saphire Barnhart regular rate, and rhythm.  Lungs: Clear to auscultation bilaterally  Abdomen: Soft, nontender, nondistended Neurological: Alert and oriented 1. Moves all extremities 4. Cranial nerves II through XII grossly intact. Skin: Warm and dry. No rashes or lesions. Extremities:  bilateral LE edema     Data Reviewed: I have personally reviewed following labs and imaging studies  CBC: Recent Labs  Lab 01/10/19 2132  01/13/19 0439 01/14/19 0448 01/15/19 0435 01/16/19 0523 01/17/19 0448  WBC 7.9   < > 8.2 8.1 8.8 8.7 8.5  NEUTROABS 4.5  --   --   --   --   --   --   HGB 12.4*   < > 10.7* 10.8* 10.7* 11.4* 11.8*  HCT 37.5*   < > 30.7* 31.2* 30.7* 32.4* 35.0*  MCV 99.5   < > 95.3 96.0 95.6 96.1 98.9  PLT 205   < > 164 146* 131* 106* 121*   < > = values in this interval not displayed.   Basic Metabolic Panel: Recent Labs  Lab 01/13/19 0439 01/14/19 0448 01/15/19 0435 01/16/19 0523 01/17/19 0448  NA 137 140 141 141 140  K 4.1 4.3 4.0 4.5 4.1  CL 108 111 111 112* 111  CO2 16* 18* 18* 14* 18*  GLUCOSE 95 141*  114* 121* 139*  BUN 84* 89* 94* 97* 99*  CREATININE 3.90* 4.02* 4.03* 4.04* 3.73*  CALCIUM 8.7* 8.7* 8.8* 8.6* 8.8*  MG 2.3 2.4 2.3 2.3 2.3  PHOS 5.0* 4.9* 4.8*  --   --    GFR: Estimated Creatinine Clearance: 16.1 mL/min (Anistyn Graddy) (by C-G formula based on SCr of 3.73 mg/dL (H)). Liver Function Tests: Recent Labs  Lab 01/12/19 0418 01/13/19 0439 01/14/19 0448 01/15/19 0435 01/16/19 0523 01/17/19 0448  AST 85* 82* 91*  --  102* 100*  ALT 53* 51* 57*  --  60* 66*  ALKPHOS 313* 290* 316*  --  285* 300*  BILITOT 1.1 1.5* 1.2  --  1.1 1.5*  PROT 5.0* 5.1* 5.2*  --  5.3* 5.6*  ALBUMIN 2.4* 2.4* 2.4*  2.3* 2.3* 2.3* 2.4*   No results for input(s): LIPASE, AMYLASE in the last 168 hours. Recent Labs  Lab 01/15/19 1450  AMMONIA 17   Coagulation Profile: Recent Labs  Lab 01/10/19 2132  INR 1.2   Cardiac Enzymes: No results for input(s): CKTOTAL, CKMB, CKMBINDEX, TROPONINI in the last 168 hours. BNP (last 3 results) No results for input(s): PROBNP in the last 8760 hours. HbA1C: No results for input(s): HGBA1C in the last 72 hours. CBG: Recent Labs  Lab 01/16/19 1812 01/16/19 2126 01/17/19 0643 01/17/19 1116 01/17/19 1638  GLUCAP 130* 129* 131* 150* 152*   Lipid Profile: No results for input(s): CHOL, HDL, LDLCALC, TRIG, CHOLHDL, LDLDIRECT in the last 72 hours. Thyroid Function Tests: No results for input(s): TSH, T4TOTAL, FREET4, T3FREE, THYROIDAB in the last 72 hours. Anemia Panel: No results for input(s): VITAMINB12, FOLATE, FERRITIN, TIBC, IRON, RETICCTPCT in the last 72 hours. Sepsis Labs: No results for input(s): PROCALCITON, LATICACIDVEN in the last 168 hours.  Recent Results (from the past 240 hour(s))  SARS CORONAVIRUS 2 (TAT 6-24 HRS) Nasopharyngeal Nasopharyngeal Swab     Status: None   Collection Time: 01/10/19 11:35 PM   Specimen: Nasopharyngeal Swab  Result Value Ref Range Status   SARS Coronavirus 2 NEGATIVE NEGATIVE Final    Comment: (NOTE)  SARS-CoV-2 target nucleic acids are NOT DETECTED. The SARS-CoV-2 RNA is generally detectable in upper and lower respiratory specimens during the acute phase of infection. Negative results do not preclude SARS-CoV-2 infection, do not rule out co-infections with other pathogens, and should not be used as the sole basis for treatment or other patient management decisions. Negative results  must be combined with clinical observations, patient history, and epidemiological information. The expected result is Negative. Fact Sheet for Patients: SugarRoll.be Fact Sheet for Healthcare Providers: https://www.woods-mathews.com/ This test is not yet approved or cleared by the [place] FDA and  has been authorized for detection and/or diagnosis of SARS-CoV-2 by FDA under an Emergency Use Authorization (EUA). This EUA will remain  in effect (meaning this test can be used) for the duration of the COVID-19 declaration under Section 56 4(b)(1) of the Act, 21 U.S.C. section 360bbb-3(b)(1), unless the authorization is terminated or revoked sooner. Performed at [facility], [place] [address] [place], [place] [address]   Gram stain     Status: None   Collection Time: 01/12/19  2:24 PM   Specimen: Abdomen; Peritoneal Fluid  Result Value Ref Range Status   Specimen Description PERITONEAL  Final   Special Requests NONE  Final   Gram Stain   Final    ABUNDANT WBC PRESENT, PREDOMINANTLY MONONUCLEAR NO ORGANISMS SEEN Performed at [facility], [place] [address] [place], [place] [address]    Report Status 01/12/2019 FINAL  Final  Culture, body fluid-bottle     Status: None   Collection Time: 01/12/19  2:24 PM   Specimen: Peritoneal Washings  Result Value Ref Range Status   Specimen Description PERITONEAL  Final   Special Requests NONE  Final   Culture   Final    NO GROWTH 5 DAYS Performed at [facility], [address], [place], [place]  [address]    Report Status 01/17/2019 FINAL  Final  MRSA PCR Screening     Status: None   Collection Time: 01/16/19 11:29 PM   Specimen: Nasal Mucosa; Nasopharyngeal  Result Value Ref Range Status   MRSA by PCR NEGATIVE NEGATIVE Final    Comment:        The GeneXpert MRSA Assay (FDA approved for NASAL specimens only), is one component of [place] Kemmerer comprehensive MRSA colonization surveillance program. It is not intended to diagnose MRSA infection nor to guide or monitor treatment for MRSA infections. Performed at [facility], [place] [address] [place], [place] [address]   SARS CORONAVIRUS 2 (TAT 6-24 HRS) Nasopharyngeal Nasopharyngeal Swab     Status: None   Collection Time: 01/17/19 11:28 AM   Specimen: Nasopharyngeal Swab  Result Value Ref Range Status   SARS Coronavirus 2 NEGATIVE NEGATIVE Final    Comment: (NOTE) SARS-CoV-2 target nucleic acids are NOT DETECTED. The SARS-CoV-2 RNA is generally detectable in upper and lower respiratory specimens during the acute phase of infection. Negative results do not preclude SARS-CoV-2 infection, do not rule out co-infections with other pathogens, and should not be used as the sole basis for treatment or other patient management decisions. Negative results must be combined with clinical observations, patient history, and epidemiological information. The expected result is Negative. Fact Sheet for Patients: SugarRoll.be Fact Sheet for Healthcare Providers: https://www.woods-mathews.com/ This test is not yet approved or cleared by the [place] FDA and  has been authorized for detection and/or diagnosis of SARS-CoV-2 by FDA under an Emergency Use Authorization (EUA). This EUA will remain  in effect (meaning this test can be used) for the duration of the COVID-19 declaration under Section 56 4(b)(1) of the Act, 21 U.S.C. section 360bbb-3(b)(1), unless the authorization is terminated or revoked  sooner. Performed at [facility], [place] [address], [place], [place] [address]          Radiology  Studies: No results found.      Scheduled Meds: . Chlorhexidine Gluconate Cloth  6 each Topical Daily  . feeding supplement (PRO-STAT SUGAR FREE 64)  30 mL Oral TID BM  . folic acid  1 mg Oral Daily  . heparin  5,000 Units Subcutaneous Q8H  . insulin aspart  0-5 Units Subcutaneous QHS  . insulin aspart  0-9 Units Subcutaneous TID WC  . midodrine  10 mg Oral TID WC  . multivitamin with minerals  1 tablet Oral Daily  . sodium bicarbonate  1,300 mg Oral BID  . tamsulosin  0.4 mg Oral Daily  . thiamine  100 mg Oral Daily   Continuous Infusions:    LOS: 7 days    Time spent: over 30 min    Fayrene Helper, MD [facility] Pager AMION  If 7PM-7AM, please contact night-coverage [email or web address] Password [facility] 01/17/2019, 8:36 PM

## 2019-01-17 NOTE — TOC Progression Note (Addendum)
Transition of Care [facility]) - Progression Note    Patient Details  Name: Daniel [name] MRN: [id number] Date of Birth: 29-Aug-1933  Transition of Care [facility]) CM/SW Contact  Zenon Mayo, RN Phone Number: 01/17/2019, 4:50 PM  Clinical Narrative:    NCM spoke with Toribio Harbour at [facility], she states the patient is not at ALF level any more and they will not be able to take him back.  NCM will aske son , [name] if he can pay privately for SNF with Hospice following.  Son states he would like for NCM to contact Dorene Sorrow at [facility] in the am to see if she can help. Son states he may can work something out with private pay , NCM informed him it may be around 7 or 8000.00 dollars.      Expected Discharge Plan: Skilled Nursing Facility Barriers to Discharge: No Barriers Identified  Expected Discharge Plan and Services Expected Discharge Plan: [place] In-house Referral: NA Discharge Planning Services: CM Consult   Living arrangements for the past 2 months: Assisted Living Facility                 DME Arranged: (NA)         HH Arranged: NA           Social Determinants of Health (SDOH) Interventions    Readmission Risk Interventions Readmission Risk Prevention Plan 01/12/2019  Transportation Screening Complete  HRI or [place] Complete  Social Work Consult for [place] Planning/Counseling Complete  Palliative Care Screening Not Applicable  Medication Review Press photographer) Complete  Some recent data might be hidden

## 2019-01-17 NOTE — Consult Note (Signed)
   [facility] CM Inpatient Consult   01/17/2019  Daniel [name] 10/27/1933 [id number]   Patient screened for high risk score for unplanned readmission score of 26%  and for hospitalizations.  Chart reviewed to check if potential [place] [facility] services are needed in the [place] list.    Review of patient's medical record reveals patient is patient is for Skilled rehab or return to ALF, [place] with Hospice.  Palliative Care consult reviewed and Inpatient [facility] RNCM notes, as well.    Primary Care Provider is Dr. Jani Gravel, this office provides the transition of care follow up.  Plan:  Follow for disposition needs. No current [facility] needs noted.    Please place a [facility] Care Management consult as appropriate and for questions contact:   Natividad Brood, RN BSN [facility] Liaison  [phone] business mobile phone Toll free office [phone]  Fax number: [phone] [email or web address]

## 2019-01-17 NOTE — Progress Notes (Signed)
Daily Progress Note   Patient Name: Daniel [name]       Date: 01/17/2019 DOB: 1933/11/27  Age: 85 y.o. MRN#: [id number] Attending Physician: Elodia Florence., * Primary Care Physician: Jani Gravel, MD Admit Date: 01/10/2019  Reason for Consultation/Follow-up: Establishing goals of care  Subjective: Very small amount of intake - drinking well, not much food. Spending most of his time sleeping.   Length of Stay: 7  Current Medications: Scheduled Meds:  . Chlorhexidine Gluconate Cloth  6 each Topical Daily  . feeding supplement (PRO-STAT SUGAR FREE 64)  30 mL Oral TID BM  . folic acid  1 mg Oral Daily  . heparin  5,000 Units Subcutaneous Q8H  . insulin aspart  0-5 Units Subcutaneous QHS  . insulin aspart  0-9 Units Subcutaneous TID WC  . midodrine  10 mg Oral TID WC  . multivitamin with minerals  1 tablet Oral Daily  . sodium bicarbonate  1,300 mg Oral BID  . tamsulosin  0.4 mg Oral Daily  . thiamine  100 mg Oral Daily    Continuous Infusions:   PRN Meds: lidocaine (PF)    Vital Signs: BP (!) 115/51 (BP Location: Right Arm)   Pulse 78   Temp (!) 97.2 F (36.2 C) (Oral)   Resp 18   Wt 85.3 kg   SpO2 98%   BMI 28.18 kg/m  SpO2: SpO2: 98 % O2 Device: O2 Device: Room Air O2 Flow Rate:    Intake/output summary:   Intake/Output Summary (Last 24 hours) at 01/17/2019 1629 Last data filed at 01/17/2019 1200 Gross per 24 hour  Intake 1080 ml  Output 600 ml  Net 480 ml   LBM: Last BM Date: 01/16/19 Baseline Weight: Weight: 92.3 kg Most recent weight: Weight: 85.3 kg       Palliative Assessment/Data: PPS 40%    Flowsheet Rows     Most Recent Value  Intake Tab  Referral Department  Hospitalist  Unit at Time of Referral  Cardiac/Telemetry Unit  Palliative Care  Primary Diagnosis  Other (Comment) [liver cirrhosis]  Date Notified  01/14/19  Palliative Care Type  New Palliative care  Reason for referral  Clarify Goals of Care  Date of Admission  01/10/19  Date first seen by Palliative Care  01/15/19  # of days Palliative referral response time  1 Day(s)  # of days IP prior to Palliative referral  4  Clinical Assessment  Palliative Performance Scale Score  40%  Psychosocial & Spiritual Assessment  Palliative Care Outcomes  Patient/Family meeting held?  Yes  Who was at the meeting?  son  Palliative Care Outcomes  Transitioned to hospice      Patient Active Problem List   Diagnosis Date Noted  . Anasarca   . Goals of care, counseling/discussion   . Palliative care by specialist   . AKI (acute kidney injury) ([place]) 01/11/2019  . Bradycardia 01/11/2019  . Edema 01/10/2019  . Evaluation by psychiatric service required   . Acute metabolic encephalopathy 29/79/8921  . Gait instability 10/12/2018  . HLD (hyperlipidemia) 10/12/2018  . Elevated troponin 10/12/2018  . Hypoglycemia 07/19/2017  . Hypotension 07/19/2017  . Normochromic normocytic anemia 07/19/2017  .  Thrombocytopenia ([place]) 07/19/2017  . CKD (chronic kidney disease), stage III 07/19/2017  . Seizure ([place]) 06/16/2017  . Type II diabetes mellitus with neurological manifestations ([place]) 03/05/2015  . Porokeratosis 03/05/2015  . Chronic systolic CHF (congestive heart failure) ([place]) 01/28/2015  . Coronary artery disease 01/28/2015  . Bruit of left carotid artery 01/28/2015  . 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 H/O detached retina repair 08/23/2014  . Acute on chronic renal failure ([place]) 04/17/2013  . Dehydration 04/17/2013  . Alcohol dependence ([place]) 04/17/2013  . Transaminasemia 04/17/2013  . Type II diabetes mellitus with renal manifestations ([place]) 04/17/2013  . Near syncope 04/17/2013  . UTI (lower urinary tract infection) 04/17/2013  . Hematuria  04/17/2013    Palliative Care Assessment & Plan   HPI: 85 y.o. male  with past medical history of liver cirrhosis, alcohol dependence, CHF, CAD s/p stent, T2DM, CKD3, HTN, HLD, peripheral neuropathy, seizures, and prostate cancer admitted on 01/10/2019 with increasing bilateral lower extremity edema extending into abdomen.  Edema is attributed to decompensated liver cirrhosis. Albumin is 2.1. Was started on lasix, but this was d/c'd d/t worsening renal function. Creatinine up to 4.02, baseline is 1.3. Patient minimally interactive with poor PO intake. PMT consulted for [place].   Assessment: Called by son, [name], regarding concerns about if facility will accept patient back. Discussed concerns with case manager who is addressing.  I continue to support decisions for return to ALF with hospice per family's wishes - this ensures patient can be close to wife, he will have the extra support of hospice at the facility to assist with care needs and symptom management.   Called [name] back to assure him the hospital was addressing his concerns and working towards plan of discharge to ALF with hospice. He was grateful for support. All questions and concerns addressed.   Recommendations/Plan:  Return to ALF with hospice  Care should be focused on quality of life/comfort - avoid aggressive medical interventions  I will not be on tomorrow - will ask PMT RN to shadow and can intervene as necessary  Code Status:  DNR  Prognosis:   < 6 months  Discharge Planning:  ALF with hospice  Care plan was discussed with case manager, patient's son, [name]  Thank you for allowing the Palliative Medicine Team to assist in the care of this patient.  The above conversation was completed via telephone due to the visitor restrictions during the COVID-19 pandemic. Thorough chart review and discussion with necessary members of the care team was completed as part of assessment. All issues were discussed and  addressed but no physical exam was performed.   Total Time 25 minutes Prolonged Time Billed  no       Greater than 50%  of this time was spent counseling and coordinating care related to the above assessment and plan.  Juel Burrow, DNP, [facility] Palliative Medicine Team Team Phone # [phone]  Pager [phone]

## 2019-01-17 NOTE — Plan of Care (Signed)
  Problem: Education: Goal: Knowledge of General Education information will improve Description: Including pain rating scale, medication(s)/side effects and non-pharmacologic comfort measures Outcome: Progressing   Problem: Nutrition: Goal: Adequate nutrition will be maintained Outcome: Progressing   

## 2019-01-17 NOTE — Progress Notes (Signed)
Physical Therapy Treatment Patient Details Name: Daniel [name] MRN: [id number] DOB: [birth year] Today's Date: 01/17/2019    History of Present Illness Pt is an 85 y/o male admitted secondary to increased LE edema likely from related to decompensated liver cirrhosis. PMH includes alcohol dependence, DM, liver cirrhosis, CAD s/p stent placement, CKD, HTN, and prostate cancer.     PT Comments    Pt continues to be limited by fatigue and LE weakness during PT. Pt requiring less assistance during bed mobility, however continues to require significant physical assistance for  Transfers and is unable to initiate gait training safely at this time. Pt will continue to benefit from acute PT services to improve LE strength and endurance and to reduce falls risk during OOB activity.  Follow Up Recommendations  SNF;Supervision/Assistance - 24 hour     Equipment Recommendations  (defer to post-acute setting)    Recommendations for Other Services       Precautions / Restrictions Precautions Precautions: Fall Restrictions Weight Bearing Restrictions: No    Mobility  Bed Mobility Overal bed mobility: Needs Assistance Bed Mobility: Supine to Sit     Supine to sit: Min assist     General bed mobility comments: pt utilizing bed rails and PT UE to pull up to sitting  Transfers Overall transfer level: Needs assistance Equipment used: Rolling walker (2 wheeled) Transfers: Sit to/from [facility] Sit to Stand: Max assist;From elevated surface Stand pivot transfers: Max assist;From elevated surface       General transfer comment: pt requiring maxA for sit to stand, PT attempting to provide internal and external cues for forward lean and trunk flexion however pt minimally responsive. Pt requesting to sit midway through stand pivot transfer, PT providing maxA to   Ambulation/Gait                 Stairs             Wheelchair Mobility    Modified Rankin  (Stroke Patients Only)       Balance Overall balance assessment: Needs assistance Sitting-balance support: Bilateral upper extremity supported;Feet supported Sitting balance-Leahy Scale: Fair Sitting balance - Comments: minG-close supervision for static sitting balance Postural control: Posterior lean Standing balance support: Bilateral upper extremity supported Standing balance-Leahy Scale: Poor Standing balance comment: modA for static standing balance with BUE support                            Cognition Arousal/Alertness: Awake/alert Behavior During Therapy: WFL for tasks assessed/performed Overall Cognitive Status: History of cognitive impairments - at baseline                                        Exercises General Exercises - Lower Extremity Ankle Circles/Pumps: AROM;Both;20 reps;Seated Long Arc Quad: AROM;Both;10 reps;Seated    General Comments General comments (skin integrity, edema, etc.): pt reporting dizziness with first 2 attempts at standing, reporting resolution of symptoms with sitting and brief LE exercises.      Pertinent Vitals/Pain Pain Assessment: Faces Faces Pain Scale: No hurt    Home Living                      Prior Function            PT Goals (current goals can now be found in the care plan section)  Acute Rehab PT Goals Patient Stated Goal: To go back to ALF Progress towards PT goals: Progressing toward goals    Frequency    Min 2X/week      PT Plan Current plan remains appropriate    Co-evaluation              AM-PAC PT "6 Clicks" Mobility   Outcome Measure  Help needed turning from your back to your side while in a flat bed without using bedrails?: A Little Help needed moving from lying on your back to sitting on the side of a flat bed without using bedrails?: A Little Help needed moving to and from a bed to a chair (including a wheelchair)?: Total Help needed standing up from a  chair using your arms (e.g., wheelchair or bedside chair)?: Total Help needed to walk in hospital room?: Total Help needed climbing 3-5 steps with a railing? : Total 6 Click Score: 10    End of Session Equipment Utilized During Treatment: Gait belt Activity Tolerance: Patient limited by fatigue Patient left: in chair;with call bell/phone within reach;with chair alarm set;with nursing/sitter in room Nurse Communication: Mobility status PT Visit Diagnosis: Difficulty in walking, not elsewhere classified (R26.2);Muscle weakness (generalized) (M62.81);Unsteadiness on feet (R26.81)     Time: 1115-5208 PT Time Calculation (min) (ACUTE ONLY): 23 min  Charges:  $Therapeutic Activity: 23-37 mins                     Zenaida Niece, PT, DPT Acute Rehabilitation Pager: [phone]  Zenaida Niece 01/17/2019, 3:31 PM

## 2019-01-17 NOTE — Plan of Care (Signed)
  Problem: Education: Goal: Knowledge of General Education information will improve Description: Including pain rating scale, medication(s)/side effects and non-pharmacologic comfort measures Outcome: Progressing   Problem: Clinical Measurements: Goal: Ability to maintain clinical measurements within normal limits will improve Outcome: Progressing   Problem: Activity: Goal: Risk for activity intolerance will decrease Outcome: Progressing   Problem: Elimination: Goal: Will not experience complications related to urinary retention Outcome: Progressing   Problem: Pain Managment: Goal: General experience of comfort will improve Outcome: Progressing   Problem: Skin Integrity: Goal: Risk for impaired skin integrity will decrease Outcome: Progressing

## 2019-01-18 DIAGNOSIS — K7460 Unspecified cirrhosis of liver: Secondary | ICD-10-CM

## 2019-01-18 DIAGNOSIS — E1129 Type 2 diabetes mellitus with other diabetic kidney complication: Secondary | ICD-10-CM

## 2019-01-18 DIAGNOSIS — K7290 Hepatic failure, unspecified without coma: Secondary | ICD-10-CM

## 2019-01-18 DIAGNOSIS — R601 Generalized edema: Secondary | ICD-10-CM | POA: Diagnosis not present

## 2019-01-18 DIAGNOSIS — Z794 Long term (current) use of insulin: Secondary | ICD-10-CM

## 2019-01-18 DIAGNOSIS — N179 Acute kidney failure, unspecified: Secondary | ICD-10-CM | POA: Diagnosis not present

## 2019-01-18 DIAGNOSIS — R1310 Dysphagia, unspecified: Secondary | ICD-10-CM

## 2019-01-18 LAB — GLUCOSE, CAPILLARY
Glucose-Capillary: 140 mg/dL — ABNORMAL HIGH (ref 70–99)
Glucose-Capillary: 152 mg/dL — ABNORMAL HIGH (ref 70–99)
Glucose-Capillary: 142 mg/dL — ABNORMAL HIGH (ref 70–99)
Glucose-Capillary: 134 mg/dL — ABNORMAL HIGH (ref 70–99)

## 2019-01-18 LAB — CBC
HCT: 32.9 % — ABNORMAL LOW (ref 39.0–52.0)
Hemoglobin: 11.2 g/dL — ABNORMAL LOW (ref 13.0–17.0)
MCH: 32.9 pg (ref 26.0–34.0)
MCHC: 34 g/dL (ref 30.0–36.0)
MCV: 96.8 fL (ref 80.0–100.0)
Platelets: 116 10*3/uL — ABNORMAL LOW (ref 150–400)
RBC: 3.4 MIL/uL — ABNORMAL LOW (ref 4.22–5.81)
RDW: 14.5 % (ref 11.5–15.5)
WBC: 9.2 10*3/uL (ref 4.0–10.5)
nRBC: 0 % (ref 0.0–0.2)

## 2019-01-18 LAB — COMPREHENSIVE METABOLIC PANEL
ALT: 66 U/L — ABNORMAL HIGH (ref 0–44)
AST: 105 U/L — ABNORMAL HIGH (ref 15–41)
Albumin: 2.4 g/dL — ABNORMAL LOW (ref 3.5–5.0)
Alkaline Phosphatase: 326 U/L — ABNORMAL HIGH (ref 38–126)
Anion gap: 11 (ref 5–15)
BUN: 101 mg/dL — ABNORMAL HIGH (ref 8–23)
CO2: 19 mmol/L — ABNORMAL LOW (ref 22–32)
Calcium: 8.4 mg/dL — ABNORMAL LOW (ref 8.9–10.3)
Chloride: 106 mmol/L (ref 98–111)
Creatinine, Ser: 3.52 mg/dL — ABNORMAL HIGH (ref 0.61–1.24)
GFR calc Af Amer: 18 mL/min — ABNORMAL LOW (ref >60)
GFR calc non Af Amer: 15 mL/min — ABNORMAL LOW (ref >60)
Glucose, Bld: 139 mg/dL — ABNORMAL HIGH (ref 70–99)
Potassium: 3.8 mmol/L (ref 3.5–5.1)
Sodium: 136 mmol/L (ref 135–145)
Total Bilirubin: 1.6 mg/dL — ABNORMAL HIGH (ref 0.3–1.2)
Total Protein: 5.3 g/dL — ABNORMAL LOW (ref 6.5–8.1)

## 2019-01-18 LAB — MAGNESIUM: Magnesium: 2.3 mg/dL (ref 1.7–2.4)

## 2019-01-18 MED ORDER — LEVETIRACETAM 500 MG PO TABS
500.0000 mg | ORAL_TABLET | Freq: Two times a day (BID) | ORAL | Status: DC
  Administered 2019-01-18 – 2019-01-19 (×3): 500 mg via ORAL
  Filled 2019-01-18 (×3): qty 1

## 2019-01-18 MED ORDER — DIPHENHYDRAMINE HCL 25 MG PO CAPS
25.0000 mg | ORAL_CAPSULE | Freq: Once | ORAL | Status: AC
  Administered 2019-01-18: 25 mg via ORAL
  Filled 2019-01-18: qty 1

## 2019-01-18 MED ORDER — SERTRALINE HCL 50 MG PO TABS
50.0000 mg | ORAL_TABLET | Freq: Every day | ORAL | Status: DC
  Administered 2019-01-18 – 2019-01-19 (×2): 50 mg via ORAL
  Filled 2019-01-18 (×2): qty 1

## 2019-01-18 MED ORDER — FINASTERIDE 5 MG PO TABS
5.0000 mg | ORAL_TABLET | Freq: Every day | ORAL | Status: DC
  Administered 2019-01-18 – 2019-01-19 (×2): 5 mg via ORAL
  Filled 2019-01-18 (×2): qty 1

## 2019-01-18 MED ORDER — LATANOPROST 0.005 % OP SOLN
1.0000 [drp] | Freq: Every day | OPHTHALMIC | Status: DC
  Administered 2019-01-18: 1 [drp] via OPHTHALMIC
  Filled 2019-01-18: qty 2.5

## 2019-01-18 MED ORDER — MAGNESIUM 250 MG PO TABS: 250.0000 mg | ORAL_TABLET | Freq: Every day | ORAL | Status: DC

## 2019-01-18 MED ORDER — MAGNESIUM OXIDE 400 (241.3 MG) MG PO TABS
200.0000 mg | ORAL_TABLET | Freq: Every day | ORAL | Status: DC
  Administered 2019-01-19: 12:00:00 200 mg via ORAL
  Filled 2019-01-18: qty 1

## 2019-01-18 NOTE — Progress Notes (Signed)
PROGRESS NOTE  Daniel [name] MWN:[id number] DOB: Feb 17, 1934 DOA: 01/10/2019 PCP: Jani Gravel, MD  Brief History   85 year old man from [place] [facility], PMH cirrhosis, alcohol dependence, CHF, CAD, diabetes mellitus type 2, CKD stage III presented with increasing bilateral lower extremity edema up to the abdomen.  Admitted for anasarca secondary to decompensated cirrhosis.  He was treated with diuresis and was seen by nephrology in consultation, treated with midodrine and furosemide.  Renal function worsened and Lasix was stopped, nephrology recommended palliative care consultation and signed off.  SNF was recommended but denied by insurance company.  Eventually plan was made for return to ALF with hospice but ALF refused.  A & P  Anasarca secondary to decompensated cirrhosis with ascites, asymptomatic left pleural effusion --Did not tolerate Lasix which worsened renal function.  Family has elected to pursue comfort based approach, outpatient hospice.  AKI superimposed on CKD stage III, with associated metabolic acidosis, thought hemodynamically mediated given low blood pressure on admission.  Hypotension on ARB considered.  Ultrasound no hydronephrosis, fairly normal size kidneys. --Continue sodium bicarbonate per nephrology --Stop the ARB permanently --Not a candidate for hemodialysis secondary to chronic debilitated state, age and dementia --Foley catheter placed for urinary retention 10/27.  Flomax started.  Decompensated cirrhosis with ascites, thrombocytopenia --Family has elected hospice approach.  Bradycardia on admission, asymptomatic --Not on any rate control agent  Elevated troponin on admission --Asymptomatic, no signs or symptoms of ACS, not clear why this test was ordered.  It appears to be chronically elevated and was seen at similar levels in July 2020.  No further evaluation suggested.  Diabetes mellitus type 2 --Stable.  Continue sliding scale insulin.  Dysphagia  --Dysphagia 3 diet, thin liquids per speech therapy Dysphagia 3 (Mech soft);Thin liquid  Liquid Administration via: Cup;Straw Medication Administration: Whole meds with liquid Supervision: Staff to assist with self feeding Compensations: Slow rate;Small sips/bites;Alternate solids and liquids as need to clear oral cavity Postural Changes: Seated upright at 90 degrees   CAD status post stent placement, LVEF 45-50% --Appears stable.  PMH alcohol dependence FTT with decline over the last 4 months Dementia   DVT prophylaxis: heparin Code Status: DNR Family Communication:  Disposition Plan: PT recommended SNF but insurance declined.  Case management working on ALF with hospice although facility not currently excepting.   Murray Hodgkins, MD  [facility] Direct contact: see [email or web address] (further directions at bottom of note if needed) 7PM-7AM contact night coverage as at bottom of note 01/18/2019, 2:29 PM  LOS: 8 days   Significant Hospital Events   . 10/20 admitted for anasarca . 10/21 nephrology consultation . Renal ultrasound no hydronephrosis or acute finding   Consults:  . Nephrology . Palliative medicine   Procedures:  . 10/22 ultrasound-guided paracentesis with removal of 2 L  Significant Diagnostic Tests:  . SARS-CoV-2 negative   Micro Data:  .    Antimicrobials:  .   Interval History/Subjective  Feels okay, no complaints.  Objective   Vitals:  Vitals:   01/18/19 0413 01/18/19 0858  BP: (!) 105/53 (!) 109/53  Pulse: 82 80  Resp: 17 16  Temp: (!) 97.5 F (36.4 C) (!) 97.3 F (36.3 C)  SpO2: 97% 99%    Exam:  Constitutional:  . Appears calm and comfortable ENMT:  . grossly normal hearing  Respiratory:  . CTA bilaterally, no w/r/r.  . Respiratory effort normal.  Cardiovascular:  . RRR, no m/r/g . 3+ bilateral thigh edema.  1+ bilateral lower extremity edema.  Abdomen:  Marland Kitchen Massive abdominal edema and significant ascites Psychiatric:   . Mental status o Mood, affect appropriate  I have personally reviewed the following:   Today's Data  . Creatinine trending down, 3.52, BUN trending up, 101, CO2 stable at 19  AST and ALT stable at 105 and 66.  Total bilirubin stable at 1.6.  Platelets stable 116  Scheduled Meds: . Chlorhexidine Gluconate Cloth  6 each Topical Daily  . feeding supplement (PRO-STAT SUGAR FREE 64)  30 mL Oral TID BM  . finasteride  5 mg Oral Daily  . folic acid  1 mg Oral Daily  . heparin  5,000 Units Subcutaneous Q8H  . insulin aspart  0-5 Units Subcutaneous QHS  . insulin aspart  0-9 Units Subcutaneous TID WC  . latanoprost  1 drop Both Eyes QHS  . levETIRAcetam  500 mg Oral BID  . [START ON 01/19/2019] magnesium oxide  200 mg Oral Daily  . midodrine  10 mg Oral TID WC  . multivitamin with minerals  1 tablet Oral Daily  . sertraline  50 mg Oral Daily  . sodium bicarbonate  1,300 mg Oral BID  . tamsulosin  0.4 mg Oral Daily  . thiamine  100 mg Oral Daily   Continuous Infusions:  Principal Problem:   Decompensated hepatic cirrhosis (HCC) Active Problems:   Alcohol dependence (HCC)   Type II diabetes mellitus with renal manifestations (HCC)   Edema   AKI (acute kidney injury) ([place])   Bradycardia   Anasarca   Goals of care, counseling/discussion   Palliative care by specialist   Dysphagia   LOS: 8 days   How to contact the [facility] Attending or Consulting provider [place] or covering provider during after hours [place], for this patient?  1. Check the care team in [facility] and look for a) attending/consulting TRH provider listed and b) the [facility] team listed 2. Log into [email or web address] and use 's universal password to access. If you do not have the password, please contact the hospital operator. 3. Locate the [facility] provider you are looking for under [facility] and page to a number that you can be directly reached. 4. If you still have difficulty reaching the provider, please page the  [facility] (Director on Call) for the Hospitalists listed on amion for assistance.

## 2019-01-18 NOTE — Progress Notes (Addendum)
Nutrition Follow-up  DOCUMENTATION CODES:   Not applicable  INTERVENTION:   -Continue downgraded diet to dysphagia 3 (advanced mechanical soft) for ease of intake -Continue MVI with minerals daily -Continue 30 ml Prostat TID, each supplement provides 100 kclas and 15 grams protein -Continue Magic Cup TID with meals, each supplement provides 290 kcals and 9 grams protein -Continue Feeding assistance with meals  NUTRITION DIAGNOSIS:   Inadequate oral intake related to lethargy/confusion as evidenced by meal completion < 25%.  Ongoing  GOAL:   Patient will meet greater than or equal to 90% of their needs  Progressing   MONITOR:   PO intake, Supplement acceptance, Labs, Weight trends, Skin, I & O's  REASON FOR ASSESSMENT:   Consult Assessment of nutrition requirement/status  ASSESSMENT:   Daniel [name] is a 85 y.o. male with medical history significant of liver cirrhosis, alcohol dependence, CHF, CAD status post stent placement, type 2 diabetes, CKD stage III, hypertension, hyperlipidemia, peripheral neuropathy, seizures, alcohol abuse, prostate cancer status post radioactive seed implant presenting to the hospital via EMS from his nursing home for evaluation of bilateral lower extremity edema extending up to his abdomen.  Patient is not sure why he is here.  He has no complaints.  Denies shortness of breath, orthopnea, or lower extremity edema.  Denies chest pain.  States his appetite is good.  No additional history could be obtained from him.  10/22- s/p US guided LLQ paracentesis (2L clear yellow fluid removed)  Reviewed I/O's: +910 ml x 24 hours and -1.8 L since admission  UOP: 650 ml x 24 hours  Pt sleeping soundly at time of visit. Per discussion with RN, pt more alert today. She assisted pt with breakfast- pt consumed about 50% of grits and sausage.   Reviewed meal completion records- pt consuming 0-15% of meals. He is taking Prostat supplements per  MAR.  Palliative care following; plan to discharge pt to SNF with hospice services once medically stable and bed is obtained.   Medications reviewed and include folic acid, MVI, and thiamine.   Labs reviewed: CBGS: 125-152(inpatient orders for glycemic control are 0-5 units insulin aspart q HS and 0-9 units insulin aspart TID with meals).   Diet Order:   Diet Order            DIET DYS 3 Room service appropriate? No; Fluid consistency: Thin; Fluid restriction: 1200 mL Fluid  Diet effective now              EDUCATION NEEDS:   Not appropriate for education at this time  Skin:  Skin Assessment: Skin Integrity Issues: Skin Integrity Issues:: Other (Comment) Other: MASD to buttocks  Last BM:  01/17/19  Height:   Ht Readings from Last 1 Encounters:  10/11/18 5' 8.5" (1.74 m)    Weight:   Wt Readings from Last 1 Encounters:  01/18/19 84.5 kg    Ideal Body Weight:  71.4 kg  BMI:  Body mass index is 27.91 kg/m.  Estimated Nutritional Needs:   Kcal:  1850-2050  Protein:  100-115 grams  Fluid:  > 1.8 L    Shreeya Recendiz A. Jimmye Norman, RD, LDN, [place] Registered Dietitian II Certified Diabetes Care and Education Specialist Pager: [phone] After hours Pager: [phone]

## 2019-01-18 NOTE — Plan of Care (Signed)
  Problem: Clinical Measurements: Goal: Ability to maintain clinical measurements within normal limits will improve Outcome: Progressing Goal: Cardiovascular complication will be avoided Outcome: Progressing   Problem: Elimination: Goal: Will not experience complications related to bowel motility Outcome: Progressing Goal: Will not experience complications related to urinary retention Outcome: Progressing   Problem: Skin Integrity: Goal: Risk for impaired skin integrity will decrease Outcome: Progressing

## 2019-01-18 NOTE — Progress Notes (Signed)
Patient vomited after dinner, no chest pain. MD notified no new order given, pt. Tele d/c per order report given to night RN. Will continue to monitor the patient.

## 2019-01-18 NOTE — TOC Progression Note (Addendum)
Transition of Care [facility]) - Progression Note    Patient Details  Name: Daniel [name] MRN: [id number] Date of Birth: [birth year]  Transition of Care [facility]) CM/SW Contact  Daniel Mayo, RN Phone Number: 01/18/2019, 9:29 AM  Clinical Narrative:    NCM  Contacted Daniel [name] at [facility], she states she did speak with the Daniel [name], and she will take another look at patient, NCM sent updated therapies to [facility] SNF.  She states even though he was declined for SNF by insurance, he still may need SNF level care.  She will give this NCM a call back in an hour.  NCM recevied call back from Daniel [name] at [facility] she states she does not have any beds and will not be able to help at this time.  NCM received call from son, Daniel [name], he would like for NCM to call Daniel [name] at [facility]  [phone].  NCM called and left message for Daniel [name] to return call back.  [place], BSN- NCM received call from Daniel [name] at [facility] , she states she will be in contact with son, she will need updated fl2 and dc summary.  She also states they work with Daniel [name] for hospice, will need to make referral to them for patient.  Patient will also need ptar transport.  NCM contacrted Daniel [name], patient's son and updated him  [phone].  FL2 sent to [facility].   Expected Discharge Plan: Skilled Nursing Facility Barriers to Discharge: No Barriers Identified  Expected Discharge Plan and Services Expected Discharge Plan: [place] In-house Referral: NA Discharge Planning Services: CM Consult   Living arrangements for the past 2 months: Assisted Living Facility                 DME Arranged: (NA)         HH Arranged: NA           Social Determinants of Health (SDOH) Interventions    Readmission Risk Interventions Readmission Risk Prevention Plan 01/12/2019  Transportation Screening Complete  HRI or [place]  Complete  Social Work Consult for [place] Planning/Counseling Complete  Palliative Care Screening Not Applicable  Medication Review Press photographer) Complete  Some recent data might be hidden

## 2019-01-18 NOTE — NC FL2 (Signed)
[place] MEDICAID FL2 LEVEL OF CARE SCREENING TOOL     IDENTIFICATION  Patient Name: Daniel [name] Birthdate: [birth year] Sex: male Admission Date (Current Location): 01/10/2019  [facility] [place] Number:  Herbalist and Address:  The . [facility], [place] [address] [place], [place] [address]      Provider Number: [id number]  Attending Physician Name and Address:  Samuella Cota, MD  Relative Name and Phone Number:  Remer Couse [phone]    Current Level of Care: Hospital Recommended Level of Care: [place] Prior Approval Number:    Date Approved/Denied:   PASRR Number: [id number]  Discharge Plan: SNF    Current Diagnoses: Patient Active Problem List   Diagnosis Date Noted  . Decompensated hepatic cirrhosis ([place]) 01/18/2019  . Dysphagia 01/18/2019  . Anasarca   . Goals of care, counseling/discussion   . Palliative care by specialist   . AKI (acute kidney injury) ([place]) 01/11/2019  . Bradycardia 01/11/2019  . Edema 01/10/2019  . Evaluation by psychiatric service required   . Acute metabolic encephalopathy 60/12/9321  . Gait instability 10/12/2018  . HLD (hyperlipidemia) 10/12/2018  . Elevated troponin 10/12/2018  . Hypoglycemia 07/19/2017  . Hypotension 07/19/2017  . Normochromic normocytic anemia 07/19/2017  . Thrombocytopenia ([place]) 07/19/2017  . CKD (chronic kidney disease), stage III 07/19/2017  . Seizure ([place]) 06/16/2017  . Type II diabetes mellitus with neurological manifestations ([place]) 03/05/2015  . Porokeratosis 03/05/2015  . Chronic systolic CHF (congestive heart failure) ([place]) 01/28/2015  . Coronary artery disease 01/28/2015  . Bruit of left carotid artery 01/28/2015  . 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 H/O detached retina repair 08/23/2014  . Acute on chronic renal failure ([place]) 04/17/2013  . Dehydration 04/17/2013  . Alcohol dependence ([place]) 04/17/2013  .  Transaminasemia 04/17/2013  . Type II diabetes mellitus with renal manifestations ([place]) 04/17/2013  . Near syncope 04/17/2013  . UTI (lower urinary tract infection) 04/17/2013  . Hematuria 04/17/2013    Orientation RESPIRATION BLADDER Height & Weight     Self  Normal Incontinent Weight: 84.5 kg Height:     BEHAVIORAL SYMPTOMS/MOOD NEUROLOGICAL BOWEL NUTRITION STATUS      Incontinent Diet(See DC Summary)  AMBULATORY STATUS COMMUNICATION OF NEEDS Skin   Extensive Assist Verbally Normal                       Personal Care Assistance Level of Assistance  Bathing, Feeding, Dressing, Total care Bathing Assistance: Maximum assistance Feeding assistance: Maximum assistance Dressing Assistance: Maximum assistance Total Care Assistance: Maximum assistance   Functional Limitations Info  Sight, Hearing, Speech Sight Info: Adequate Hearing Info: Adequate Speech Info: Adequate    SPECIAL CARE FACTORS FREQUENCY  (Hospice)                    Contractures Contractures Info: Not present    Additional Factors Info  Code Status, Allergies Code Status Info: DNR Allergies Info: NKA           Current Medications (01/18/2019):  This is the current hospital active medication list Current Facility-Administered Medications  Medication Dose Route Frequency Provider Last Rate Last Dose  . Chlorhexidine Gluconate Cloth 2 % PADS 6 each  6 each Topical Daily Elodia Florence., MD   6 each at 01/18/19 1100  . feeding supplement (PRO-STAT SUGAR FREE 64) liquid 30 mL  30 mL Oral TID BM Powell, A  Clint Lipps., MD   30 mL at 01/18/19 1435  . finasteride (PROSCAR) tablet 5 mg  5 mg Oral Daily Samuella Cota, MD   5 mg at 01/18/19 1707  . folic acid (FOLVITE) tablet 1 mg  1 mg Oral Daily Shela Leff, MD   1 mg at 01/18/19 0914  . heparin injection 5,000 Units  5,000 Units Subcutaneous Q8H Shela Leff, MD   5,000 Units at 01/18/19 1435  . insulin aspart (novoLOG)  injection 0-5 Units  0-5 Units Subcutaneous QHS Shela Leff, MD      . insulin aspart (novoLOG) injection 0-9 Units  0-9 Units Subcutaneous TID WC Shela Leff, MD   1 Units at 01/18/19 1708  . latanoprost (XALATAN) 0.005 % ophthalmic solution 1 drop  1 drop Both Eyes QHS Samuella Cota, MD      . levETIRAcetam (KEPPRA) tablet 500 mg  500 mg Oral BID Samuella Cota, MD   500 mg at 01/18/19 1707  . lidocaine (PF) (XYLOCAINE) 1 % injection    PRN Ascencion Dike, PA-C   10 mL at 01/12/19 1400  . [START ON 01/19/2019] magnesium oxide (MAG-OX) tablet 200 mg  200 mg Oral Daily Einar Grad, RPH      . midodrine (PROAMATINE) tablet 10 mg  10 mg Oral TID WC Corliss Parish, MD   10 mg at 01/18/19 1646  . multivitamin with minerals tablet 1 tablet  1 tablet Oral Daily Shela Leff, MD   1 tablet at 01/18/19 0914  . sertraline (ZOLOFT) tablet 50 mg  50 mg Oral Daily Samuella Cota, MD   50 mg at 01/18/19 1646  . sodium bicarbonate tablet 1,300 mg  1,300 mg Oral BID Finnigan, Nancy A, DO   1,300 mg at 01/18/19 3748  . tamsulosin (FLOMAX) capsule 0.4 mg  0.4 mg Oral Daily Elodia Florence., MD   0.4 mg at 01/18/19 0914  . thiamine (VITAMIN B-1) tablet 100 mg  100 mg Oral Daily Shela Leff, MD   100 mg at 01/18/19 2707     Discharge Medications: Please see discharge summary for a list of discharge medications.  Relevant Imaging Results:  Relevant Lab Results:   Additional Information [phone] [place]

## 2019-01-19 DIAGNOSIS — K7290 Hepatic failure, unspecified without coma: Secondary | ICD-10-CM | POA: Diagnosis not present

## 2019-01-19 DIAGNOSIS — R1310 Dysphagia, unspecified: Secondary | ICD-10-CM | POA: Diagnosis not present

## 2019-01-19 DIAGNOSIS — N179 Acute kidney failure, unspecified: Secondary | ICD-10-CM | POA: Diagnosis not present

## 2019-01-19 DIAGNOSIS — R601 Generalized edema: Secondary | ICD-10-CM | POA: Diagnosis not present

## 2019-01-19 LAB — GLUCOSE, CAPILLARY
Glucose-Capillary: 143 mg/dL — ABNORMAL HIGH (ref 70–99)
Glucose-Capillary: 123 mg/dL — ABNORMAL HIGH (ref 70–99)

## 2019-01-19 MED ORDER — PRO-STAT SUGAR FREE PO LIQD: 30.0000 mL | Freq: Three times a day (TID) | ORAL | Status: DC

## 2019-01-19 MED ORDER — TAMSULOSIN HCL 0.4 MG PO CAPS: 0.4000 mg | ORAL_CAPSULE | Freq: Every day | ORAL | Status: DC

## 2019-01-19 MED ORDER — SODIUM BICARBONATE 650 MG PO TABS: 1300.0000 mg | ORAL_TABLET | Freq: Two times a day (BID) | ORAL | Status: DC

## 2019-01-19 NOTE — Progress Notes (Signed)
Physical Therapy Treatment Note  Patient seen for mobility progression. Pt requires total A +2 for bed mobility this session. Pt cleaned up after BM and repositioned toward R side to relieve pressure on sacrum and decrease risk of skin breakdown. Pt will need hoyer lift for transfers OOB. PT will continue to follow acutely.    01/19/19 0904  PT Visit Information  Last PT Received On 01/19/19  Assistance Needed +2  PT/OT/SLP Co-Evaluation/Treatment Yes  Reason for Co-Treatment For patient/therapist safety;To address functional/ADL transfers (Simultaneous filing. User may not have seen previous data.)  PT goals addressed during session Mobility/safety with mobility;Strengthening/ROM  OT goals addressed during session ADL's and self-care  History of Present Illness Pt is an 85 y/o male admitted secondary to increased LE edema likely from related to decompensated liver cirrhosis. PMH includes alcohol dependence, DM, liver cirrhosis, CAD s/p stent placement, CKD, HTN, and prostate cancer.  (Simultaneous filing. User may not have seen previous data.)  Precautions  Precautions Fall  Restrictions  Weight Bearing Restrictions No  Pain Assessment  Pain Assessment Faces  Faces Pain Scale 0  Cognition  Arousal/Alertness Lethargic  Behavior During Therapy Agitated  Overall Cognitive Status History of cognitive impairments - at baseline  General Comments Per son, pt has intermittent confusion and cognitive deficits at baseline.   Bed Mobility  Overal bed mobility Needs Assistance  Bed Mobility Rolling  Rolling Total assist;+2 for physical assistance  General bed mobility comments multimodal cues for sequencing and hand over hand assist for reaching to bed rails; pt cleaned up after BM then repositioned   General Comments  General comments (skin integrity, edema, etc.) pitting edema L foot  Exercises  Exercises General Lower Extremity  General Exercises - Lower Extremity  Heel Slides  AAROM;Both;10 reps;Supine  Ankle Circles/Pumps Both;AAROM;10 reps;Supine  PT - End of Session  Activity Tolerance Patient limited by fatigue  Patient left with call bell/phone within reach;in bed;with bed alarm set  Nurse Communication Mobility status;Need for lift equipment   PT - Assessment/Plan  PT Plan Current plan remains appropriate  PT Visit Diagnosis Difficulty in walking, not elsewhere classified (R26.2);Muscle weakness (generalized) (M62.81);Unsteadiness on feet (R26.81)  PT Frequency (ACUTE ONLY) Min 2X/week  Follow Up Recommendations SNF;Supervision/Assistance - 24 hour  PT equipment  (defer to post-acute setting)  AM-PAC PT "6 Clicks" Mobility Outcome Measure (Version 2)  Help needed turning from your back to your side while in a flat bed without using bedrails? 2  Help needed moving from lying on your back to sitting on the side of a flat bed without using bedrails? 1  Help needed moving to and from a bed to a chair (including a wheelchair)? 1  Help needed standing up from a chair using your arms (e.g., wheelchair or bedside chair)? 1  Help needed to walk in hospital room? 1  Help needed climbing 3-5 steps with a railing?  1  6 Click Score 7  Consider Recommendation of Discharge To: CIR/SNF/LTACH  PT Goal Progression  Progress towards PT goals Not progressing toward goals - comment  PT Time Calculation  PT Start Time (ACUTE ONLY) 0834  PT Stop Time (ACUTE ONLY) 0857  PT Time Calculation (min) (ACUTE ONLY) 23 min  PT General Charges  $$ ACUTE PT VISIT 1 Visit  PT Treatments  $Therapeutic Activity 8-22 mins   Earney Navy, PTA Acute Rehabilitation Services Pager: [phone] Office: [phone]

## 2019-01-19 NOTE — Progress Notes (Addendum)
[facility]  Received request from Kiel for family interest in hospice services at [facility] upon discharge. Chart reviewed. Eligibility pending. Will update note when eligibility is confirmed. Spoke briefly with spouse and son by phone attempting to make contact with another son.   Addendum at 84 - Hospice eligibility confirmed. Candida Peeling with [facility] is requesting call back and chart information [phone]). Made TOC manager Caryl Pina aware. Spoke with son [name] to confirm interest. In process of scheduling admission visit tomorrow. End of Addendum.   Please send signed and completed DNR with patient upon discharge.   Please send scripts for new medications.   [facility] referral center specialist will arrange admission visit at facility after discharge.   Thank you.  Erling Conte, [place]

## 2019-01-19 NOTE — TOC Transition Note (Signed)
Transition of Care [facility]) - CM/SW Discharge Note   Patient Details  Name: Daniel [name] MRN: [id number] Date of Birth: 02/25/1934  [facility] of [facility] CM/SW Contact:  Alberteen Sam, LCSW Phone Number: 01/19/2019, 2:37 PM   Clinical Narrative:     Patient will DC to: [facility] Anticipated DC date: 01/19/2019 Family notified:Shannon (son) Transport LF:[facility]  Per MD patient ready for DC to [facility] . RN, patient, patient's family, and facility notified of DC. Discharge Summary sent to facility. RN given number for report   [phone]. DC packet on chart. Ambulance transport requested for patient.  CSW signing off.  [place], [place]   Final next level of care: Skilled Nursing Facility Barriers to Discharge: No Barriers Identified   Patient Goals and CMS Choice Patient states their goals for this hospitalization and ongoing recovery are:: to go rehab CMS Medicare.gov Compare Post Acute Care list provided to:: Patient Represenative (must comment)(son [name]) Choice offered to / list presented to : Adult Children  Discharge Placement PASRR number recieved: 01/18/19            Patient chooses bed at: [facility] Patient to be transferred to facility by: [place] Name of family member notified: [name] Patient and family notified of of transfer: 01/19/19  Discharge Plan and Services In-house Referral: NA Discharge Planning Services: CM Consult            DME Arranged: (NA)         HH Arranged: NA          Social Determinants of Health (SDOH) Interventions     Readmission Risk Interventions Readmission Risk Prevention Plan 01/12/2019  Transportation Screening Complete  HRI or [place] Complete  Social Work Consult for [place] Planning/Counseling Complete  Palliative Care Screening Not Applicable  Medication Review Press photographer) Complete  Some recent data might be hidden

## 2019-01-19 NOTE — Care Management Important Message (Signed)
Important Message  Patient Details  Name: Daniel [name] MRN: [id number] Date of Birth: 1934/01/03   Medicare Important Message Given:  Yes     [name] 01/19/2019, 3:18 PM

## 2019-01-19 NOTE — Discharge Summary (Addendum)
Physician Discharge Summary  Daniel [name]:[id number] DOB: 06/27/1933 DOA: 01/10/2019  PCP: Daniel Gravel, MD  Admit date: 01/10/2019 Discharge date: 01/19/2019  Recommendations for follow-up 1. Hospice care with focus on quality of life and comfort 2. Ongoing medication adjustments with focus on quality of life and comfort 3. Paracentesis as needed for comfort 4. Xanax was discontinued on admission, now 9 days out, has not required any benzodiazepine.  Given advanced age and dementia, will not restart at this time. 5. Blood sugars have been well controlled with sliding scale insulin given decreased oral intake, long-acting insulin has been stopped.  Follow blood sugars as clinically indicated. 6. Foley catheter inserted for urinary retention.  Would continue or remove based on comfort.     Discharge Diagnoses: Principal diagnosis is #1 1. Anasarca secondary to decompensated cirrhosis with ascites, asymptomatic left pleural effusion 2. AKI superimposed on CKD stage III, with associated metabolic acidosis 3. Decompensated cirrhosis with ascites, thrombocytopenia 4. Bradycardia on admission, asymptomatic 5. Elevated troponin on admission 6. Diabetes mellitus type 2 7. Dysphagia 8. CAD status post stent placement, LVEF 45-50% 9. FTT with decline over the last 4 months 10. Dementia   Discharge Condition: stable Disposition: SNF with hospice  Diet recommendation:  Dysphagia 3 diet, thin liquids per speech therapy Dysphagia 3 (Mech soft);Thin liquid Liquid Administration via: Cup;Straw Medication Administration: Whole meds with liquid Supervision: Staff to assist with self feeding Compensations: Slow rate;Small sips/bites;Alternate solids and liquids as need to clear oral cavity Postural Changes: Seated upright at 90 degrees  Filed Weights   01/17/19 0513 01/18/19 0025 01/19/19 0309  Weight: 85.3 kg 84.5 kg 86.6 kg    History of present illness:  85 year old man from  [facility], PMH cirrhosis, alcohol dependence, CHF, CAD, diabetes mellitus type 2, CKD stage III presented with increasing bilateral lower extremity edema up to the abdomen.  Admitted for anasarca secondary to decompensated cirrhosis.   Hospital Course:  He was treated with diuresis and was seen by nephrology in consultation, treated with midodrine and furosemide.  Renal function worsened and Lasix was stopped, nephrology recommended palliative care consultation and signed off.  SNF was recommended but denied by insurance company.  Patient was seen by palliative medicine, and discussion with family, plan was DNR and focus on quality of life and comfort, avoid aggressive medical interventions.  Eventually plan was made for return to ALF with hospice but ALF refused.  Subsequently obtained SNF bed, plan for transfer with hospice.  Anasarca secondary to decompensated cirrhosis with ascites, asymptomatic left pleural effusion --Did not tolerate Lasix which worsened renal function.  Family has elected to pursue comfort based approach, outpatient hospice.  AKI superimposed on CKD stage III, with associated metabolic acidosis, thought hemodynamically mediated given low blood pressure on admission.  Hypotension on ARB considered.  Ultrasound no hydronephrosis, fairly normal size kidneys. --Continue sodium bicarbonate per nephrology --Stop ARB permanently --Not a candidate for hemodialysis secondary to chronic debilitated state, age and dementia per nephrology --Foley catheter placed for urinary retention 10/27.  Flomax started. --Lasix stopped per nephrology.  Midodrine stopped per nephrology, discussed today with Dr. Justin Mend.  Decompensated cirrhosis with ascites, thrombocytopenia --Family has elected hospice approach.  Bradycardia on admission, asymptomatic --Not on any rate control agent  Elevated troponin on admission --Asymptomatic, no signs or symptoms of ACS, not clear why this test was  ordered.  It appears to be chronically elevated and was seen at similar levels in July 2020.  No further evaluation suggested.  Diabetes mellitus type 2 --Stable.    Dysphagia --Dysphagia 3 diet, thin liquids per speech therapy Dysphagia 3 (Mech soft);Thin liquid Liquid Administration via: Cup;Straw Medication Administration: Whole meds with liquid Supervision: Staff to assist with self feeding Compensations: Slow rate;Small sips/bites;Alternate solids and liquids as need to clear oral cavity Postural Changes: Seated upright at 90 degrees  CAD status post stent placement, LVEF 45-50% --Appears stable.  PMH alcohol dependence FTT with decline over the last 4 months Dementia  Significant Hospital Events    10/20 admitted for anasarca  10/21 nephrology consultation  Renal ultrasound no hydronephrosis or acute finding  Consults:   Nephrology  Palliative medicine  Procedures:   10/22 ultrasound-guided paracentesis with removal of 2 L  Significant Diagnostic Tests:   SARS-CoV-2 negative  Today's assessment: S: No issues overnight per RN.  "Leave me alone".  No complaints. O: Vitals:  Vitals:   01/18/19 2125 01/19/19 0404  BP: (!) 122/57 113/72  Pulse: 90 81  Resp: 20 20  Temp: (!) 97.5 F (36.4 C) 97.7 F (36.5 C)  SpO2: 96% 98%    Constitutional:  . Appears calm and comfortable Respiratory:  . CTA bilaterally, no w/r/r.  . Respiratory effort normal Cardiovascular:  . RRR, no m/r/g . No pedal edema   Abdomen:  . Distended, nontender Psychiatric:  . Mental status o Appears confused  No new data other than stable CBG.  Discharge Instructions   Allergies as of 01/19/2019   No Known Allergies     Medication List    STOP taking these medications   ALPRAZolam 0.25 MG tablet Commonly known as: XANAX   furosemide 20 MG tablet Commonly known as: LASIX   losartan 25 MG tablet Commonly known as: COZAAR   rosuvastatin 20 MG tablet  Commonly known as: CRESTOR   Toujeo SoloStar 300 UNIT/ML Sopn Generic drug: Insulin Glargine (1 Unit Dial)     TAKE these medications   BAZA PROTECT EX Apply topically 2 (two) times daily. To buttocks   feeding supplement (PRO-STAT SUGAR FREE 64) Liqd Take 30 mLs by mouth 3 (three) times daily between meals.   finasteride 5 MG tablet Commonly known as: PROSCAR Take 5 mg by mouth daily.   folic acid 1 MG tablet Commonly known as: FOLVITE Take 1 tablet (1 mg total) by mouth daily.   latanoprost 0.005 % ophthalmic solution Commonly known as: XALATAN Place 1 drop into both eyes at bedtime.   levETIRAcetam 500 MG tablet Commonly known as: KEPPRA Take 1 tablet (500 mg total) by mouth 2 (two) times daily.   Magnesium 250 MG Tabs Take 250 mg by mouth daily.   Secura Protective Oint Generic drug: Petrolatum Apply topically 2 (two) times daily. To bilateral lower extremities   senna 8.6 MG Tabs tablet Commonly known as: SENOKOT Take 2 tablets by mouth at bedtime.   sertraline 50 MG tablet Commonly known as: ZOLOFT Take 50 mg by mouth daily.   sodium bicarbonate 650 MG tablet Take 2 tablets (1,300 mg total) by mouth 2 (two) times daily.   tamsulosin 0.4 MG Caps capsule Commonly known as: FLOMAX Take 1 capsule (0.4 mg total) by mouth daily.   thiamine 100 MG tablet Take 1 tablet (100 mg total) by mouth daily.   vitamin C 500 MG tablet Commonly known as: ASCORBIC ACID Take 500 mg by mouth daily.      No Known Allergies  The results of significant diagnostics from this hospitalization (including imaging, microbiology, ancillary and laboratory)  are listed below for reference.    Significant Diagnostic Studies: Dg Chest 2 View  Result Date: 01/11/2019 CLINICAL DATA:  Lower extremity edema. Volume overload. EXAM: CHEST - 2 VIEW COMPARISON:  Radiograph 10/12/2018 FINDINGS: Low lung volumes limit assessment. Cardiomegaly. Left pleural effusion is moderate in size,  associated basilar airspace disease likely atelectasis. No pulmonary edema. Right lung is clear. No pneumothorax. IMPRESSION: 1. Moderate left pleural effusion with associated basilar airspace disease, likely atelectasis. 2. Cardiomegaly without pulmonary edema. Electronically Signed   By: Keith Rake M.D.   On: 01/11/2019 01:49   US Renal  Result Date: 01/11/2019 CLINICAL DATA:  Initial evaluation for acute renal injury. EXAM: RENAL / URINARY TRACT ULTRASOUND COMPLETE COMPARISON:  Prior ultrasound from 04/17/2013 FINDINGS: Right Kidney: Renal measurements: 11.1 x 4.8 x 5.4 cm = volume: 150.6 mL . Echogenicity within normal limits. No mass or hydronephrosis visualized. Left Kidney: Renal measurements: 11.2 x 5.3 x 5.6 cm = volume: 173.1 mL. Echogenicity within normal limits. No nephrolithiasis or hydronephrosis. 1.4 cm simple exophytic cyst present at the upper pole. Additional 2.5 cm simple cyst present within the mid-upper kidney. Bladder: Appears normal for degree of bladder distention. Other: Moderate volume ascites seen within the partially visualized abdomen. Liver appears cirrhotic with overall small size and nodular contour. IMPRESSION: 1. No hydronephrosis or other acute finding. 2. Hepatic cirrhosis with moderate volume ascites. 3. Few small simple left renal cysts as above. Electronically Signed   By: Jeannine Boga M.D.   On: 01/11/2019 02:10   Ir Paracentesis  Result Date: 01/12/2019 INDICATION: Abdominal distention. Acute renal insufficiency. Ascites. Request for diagnostic and therapeutic paracentesis with max 2 L. EXAM: ULTRASOUND GUIDED LEFT LOWER QUADRANT PARACENTESIS MEDICATIONS: None. COMPLICATIONS: None immediate. PROCEDURE: Informed written consent was obtained from the patient after a discussion of the risks, benefits and alternatives to treatment. A timeout was performed prior to the initiation of the procedure. Initial ultrasound scanning demonstrates a large amount of  ascites within the left lower abdominal quadrant. The left lower abdomen was prepped and draped in the usual sterile fashion. 1% lidocaine was used for local anesthesia. Following this, a 19 gauge, 7-cm, Yueh catheter was introduced. An ultrasound image was saved for documentation purposes. The paracentesis was performed. The catheter was removed and a dressing was applied. The patient tolerated the procedure well without immediate post procedural complication. FINDINGS: A total of approximately 2 L of clear yellow fluid was removed. Samples were sent to the laboratory as requested by the clinical team. IMPRESSION: Successful ultrasound-guided paracentesis yielding 2 liters of peritoneal fluid. Read by: Ascencion Dike PA-C Electronically Signed   By: Jerilynn Mages.  Shick M.D.   On: 01/12/2019 14:09    Microbiology: Recent Results (from the past 240 hour(s))  SARS CORONAVIRUS 2 (TAT 6-24 HRS) Nasopharyngeal Nasopharyngeal Swab     Status: None   Collection Time: 01/10/19 11:35 PM   Specimen: Nasopharyngeal Swab  Result Value Ref Range Status   SARS Coronavirus 2 NEGATIVE NEGATIVE Final    Comment: (NOTE) SARS-CoV-2 target nucleic acids are NOT DETECTED. The SARS-CoV-2 RNA is generally detectable in upper and lower respiratory specimens during the acute phase of infection. Negative results do not preclude SARS-CoV-2 infection, do not rule out co-infections with other pathogens, and should not be used as the sole basis for treatment or other patient management decisions. Negative results must be combined with clinical observations, patient history, and epidemiological information. The expected result is Negative. Fact Sheet for Patients: SugarRoll.be Fact Sheet  for Healthcare Providers: https://www.woods-mathews.com/ This test is not yet approved or cleared by the [place] and  has been authorized for detection and/or diagnosis of SARS-CoV-2 by FDA under an  Emergency Use Authorization (EUA). This EUA will remain  in effect (meaning this test can be used) for the duration of the COVID-19 declaration under Section 56 4(b)(1) of the Act, 21 U.S.C. section 360bbb-3(b)(1), unless the authorization is terminated or revoked sooner. Performed at [facility], [place] [address], [place], [place] [address]   Gram stain     Status: None   Collection Time: 01/12/19  2:24 PM   Specimen: Abdomen; Peritoneal Fluid  Result Value Ref Range Status   Specimen Description PERITONEAL  Final   Special Requests NONE  Final   Gram Stain   Final    ABUNDANT WBC PRESENT, PREDOMINANTLY MONONUCLEAR NO ORGANISMS SEEN Performed at [facility], [place] [address] [place], [place] [address]    Report Status 01/12/2019 FINAL  Final  Culture, body fluid-bottle     Status: None   Collection Time: 01/12/19  2:24 PM   Specimen: Peritoneal Washings  Result Value Ref Range Status   Specimen Description PERITONEAL  Final   Special Requests NONE  Final   Culture   Final    NO GROWTH 5 DAYS Performed at [facility], [address] [place], [place] [address]    Report Status 01/17/2019 FINAL  Final  MRSA PCR Screening     Status: None   Collection Time: 01/16/19 11:29 PM   Specimen: Nasal Mucosa; Nasopharyngeal  Result Value Ref Range Status   MRSA by PCR NEGATIVE NEGATIVE Final    Comment:        The GeneXpert MRSA Assay (FDA approved for NASAL specimens only), is one component of a comprehensive MRSA colonization surveillance program. It is not intended to diagnose MRSA infection nor to guide or monitor treatment for MRSA infections. Performed at [facility], [place] [address], [place], [place] [address]   SARS CORONAVIRUS 2 (TAT 6-24 HRS) Nasopharyngeal Nasopharyngeal Swab     Status: None   Collection Time: 01/17/19 11:28 AM   Specimen: Nasopharyngeal Swab  Result Value Ref Range Status   SARS Coronavirus 2 NEGATIVE NEGATIVE Final     Comment: (NOTE) SARS-CoV-2 target nucleic acids are NOT DETECTED. The SARS-CoV-2 RNA is generally detectable in upper and lower respiratory specimens during the acute phase of infection. Negative results do not preclude SARS-CoV-2 infection, do not rule out co-infections with other pathogens, and should not be used as the sole basis for treatment or other patient management decisions. Negative results must be combined with clinical observations, patient history, and epidemiological information. The expected result is Negative. Fact Sheet for Patients: SugarRoll.be Fact Sheet for Healthcare Providers: https://www.woods-mathews.com/ This test is not yet approved or cleared by the [place] FDA and  has been authorized for detection and/or diagnosis of SARS-CoV-2 by FDA under an Emergency Use Authorization (EUA). This EUA will remain  in effect (meaning this test can be used) for the duration of the COVID-19 declaration under Section 56 4(b)(1) of the Act, 21 U.S.C. section 360bbb-3(b)(1), unless the authorization is terminated or revoked sooner. Performed at [facility], [place] [address], [place], [place] [address]      Labs: Basic Metabolic Panel: Recent Labs  Lab 01/13/19 0439 01/14/19 0448 01/15/19 0435 01/16/19 0523 01/17/19 0448 01/18/19 0340  NA 137 140 141 141 140 136  K 4.1 4.3 4.0 4.5 4.1 3.8  CL 108 111 111 112* 111 106  CO2 16* 18* 18* 14* 18* 19*  GLUCOSE 95 141* 114* 121* 139* 139*  BUN 84* 89* 94* 97* 99* 101*  CREATININE 3.90* 4.02* 4.03* 4.04* 3.73* 3.52*  CALCIUM 8.7* 8.7* 8.8* 8.6* 8.8* 8.4*  MG 2.3 2.4 2.3 2.3 2.3 2.3  PHOS 5.0* 4.9* 4.8*  --   --   --    Liver Function Tests: Recent Labs  Lab 01/13/19 0439 01/14/19 0448 01/15/19 0435 01/16/19 0523 01/17/19 0448 01/18/19 0340  AST 82* 91*  --  102* 100* 105*  ALT 51* 57*  --  60* 66* 66*  ALKPHOS 290* 316*  --  285* 300* 326*  BILITOT 1.5*  1.2  --  1.1 1.5* 1.6*  PROT 5.1* 5.2*  --  5.3* 5.6* 5.3*  ALBUMIN 2.4* 2.4*  2.3* 2.3* 2.3* 2.4* 2.4*    Recent Labs  Lab 01/15/19 1450  AMMONIA 17   CBC: Recent Labs  Lab 01/14/19 0448 01/15/19 0435 01/16/19 0523 01/17/19 0448 01/18/19 0340  WBC 8.1 8.8 8.7 8.5 9.2  HGB 10.8* 10.7* 11.4* 11.8* 11.2*  HCT 31.2* 30.7* 32.4* 35.0* 32.9*  MCV 96.0 95.6 96.1 98.9 96.8  PLT 146* 131* 106* 121* 116*     Recent Labs    10/12/18 0222 01/10/19 2132  BNP 509.1* 181.9*   CBG: Recent Labs  Lab 01/18/19 0532 01/18/19 1109 01/18/19 1635 01/18/19 2127 01/19/19 0635  GLUCAP 140* 152* 142* 134* 143*    Principal Problem:   Decompensated hepatic cirrhosis (HCC) Active Problems:   Alcohol dependence (HCC)   Type II diabetes mellitus with renal manifestations (HCC)   Edema   AKI (acute kidney injury) (HCC)   Bradycardia   Anasarca   Goals of care, counseling/discussion   Palliative care by specialist   Dysphagia   Time coordinating discharge: 35 minutes  Signed:  Murray Hodgkins, MD  [facility]  01/19/2019, 11:16 AM

## 2019-01-19 NOTE — Progress Notes (Signed)
Patient has been getting more confused throuoghout the night. He has been calling out for "Daniel [name]" and saying he doesn't know what's going on and something is not right. Patient requested something to help him sleep, Benadryl ordered and given. Patient still hasn't slept. Will continue to monitor.

## 2019-01-19 NOTE — TOC Progression Note (Signed)
Transition of Care [facility]) - Progression Note    Patient Details  Name: MINOR IDEN MRN: [id number] Date of Birth: Aug 03, 1933  Transition of Care [facility]) CM/SW [place], [place] Phone Number: [phone] 01/19/2019, 9:16 AM  Clinical Narrative:     CSW lvm with Butch Penny at [facility] requesting fax number to send DC summary once avail and to confirm they are ready for patient's arrival today as well as to confirm receipt of FL2.   CSW has called Venia Carbon with [facility] to inform patient will dc today to [facility], they will follow.   Expected Discharge Plan: Skilled Nursing Facility Barriers to Discharge: No Barriers Identified  Expected Discharge Plan and Services Expected Discharge Plan: [place] In-house Referral: NA Discharge Planning Services: CM Consult   Living arrangements for the past 2 months: Assisted Living Facility                 DME Arranged: (NA)         HH Arranged: NA           Social Determinants of Health (SDOH) Interventions    Readmission Risk Interventions Readmission Risk Prevention Plan 01/12/2019  Transportation Screening Complete  HRI or [place] Complete  Social Work Consult for [place] Planning/Counseling Complete  Palliative Care Screening Not Applicable  Medication Review Press photographer) Complete  Some recent data might be hidden

## 2019-01-19 NOTE — Progress Notes (Signed)
Patient alert and oriented x2 denies pain iv d/c. Report given to the RN at [facility] all question answered pt. D/c with foley catheter per order

## 2019-01-19 NOTE — Progress Notes (Signed)
Occupational Therapy Treatment Patient Details Name: Daniel [name] MRN: [id number] DOB: 1934-03-07 Today's Date: 01/19/2019    History of present illness Pt is an 85 y/o male admitted secondary to increased LE edema likely from related to decompensated liver cirrhosis. PMH includes alcohol dependence, DM, liver cirrhosis, CAD s/p stent placement, CKD, HTN, and prostate cancer. (Simultaneous filing. User may not have seen previous data.)   OT comments  Pt making minimal progress towards OT goals this session. Session focus on bed mobility to assist with posterior pericare after incontinent BM. Pt required total A +2 for bed mobility and MAX cues to initiate rolling. Pt lethargic and slightly agitated throughout session. DC plan remains appropriate. Will continue to follow acutely per POC.    Follow Up Recommendations  SNF;Supervision/Assistance - 24 hour    Equipment Recommendations  Other (comment)(defer to next venue of care)    Recommendations for Other Services      Precautions / Restrictions Precautions Precautions: Fall Restrictions Weight Bearing Restrictions: No       Mobility Bed Mobility Overal bed mobility: Needs Assistance Bed Mobility: Rolling Rolling: +2 for physical assistance;Total assist         General bed mobility comments: Total A  +2 to roll to clean up BM; cues throughout for pt to initiate task  Transfers                 General transfer comment: deferred transfer    Balance                                           ADL either performed or assessed with clinical judgement   ADL Overall ADL's : Needs assistance/impaired                             Toileting- Clothing Manipulation and Hygiene: Total assistance;Bed level Toileting - Clothing Manipulation Details (indicate cue type and reason): bed level clean up of BM       General ADL Comments: session focused on beb mobility as pt found to have BM;  Total A +2 to roll to assist with pericare     Vision       Perception     Praxis      Cognition Arousal/Alertness: Lethargic Behavior During Therapy: Agitated Overall Cognitive Status: History of cognitive impairments - at baseline                                 General Comments: agitated throughout stating, "shut the door and leave me alone."        Exercises    Shoulder Instructions       General Comments pitting edema L foot    Pertinent Vitals/ Pain       Pain Assessment: No/denies pain Faces Pain Scale: No hurt  Home Living                                          Prior Functioning/Environment              Frequency  Min 2X/week        Progress Toward Goals  OT Goals(current goals can now  be found in the care plan section)  Progress towards OT goals: Not progressing toward goals - comment(pt limited by lethargy and agitation this session limiting progression towards OT goals)  Acute Rehab OT Goals Patient Stated Goal: To go back to ALF OT Goal Formulation: With family Time For Goal Achievement: 01/27/19 Potential to Achieve Goals: Good  Plan Discharge plan remains appropriate    Co-evaluation    PT/OT/SLP Co-Evaluation/Treatment: Yes Reason for Co-Treatment: For patient/therapist safety;To address functional/ADL transfers(Simultaneous filing. User may not have seen previous data.) PT goals addressed during session: Mobility/safety with mobility;Strengthening/ROM OT goals addressed during session: ADL's and self-care      AM-PAC OT "6 Clicks" Daily Activity     Outcome Measure   Help from another person eating meals?: A Little Help from another person taking care of personal grooming?: A Lot Help from another person toileting, which includes using toliet, bedpan, or urinal?: Total Help from another person bathing (including washing, rinsing, drying)?: Total Help from another person to put on and taking  off regular upper body clothing?: A Lot Help from another person to put on and taking off regular lower body clothing?: Total 6 Click Score: 10    End of Session    OT Visit Diagnosis: Unsteadiness on feet (R26.81);Other abnormalities of gait and mobility (R26.89);Repeated falls (R29.6);Muscle weakness (generalized) (M62.81);History of falling (Z91.81);Other symptoms and signs involving cognitive function   Activity Tolerance Patient limited by lethargy;Treatment limited secondary to agitation   Patient Left in bed;with call bell/phone within reach;with bed alarm set   Nurse Communication          Time: [phone] OT Time Calculation (min): 23 min  Charges: OT General Charges $OT Visit: 1 Visit OT Treatments $Self Care/Home Management : 8-22 mins  [place], [place] 01/19/2019, 9:12 AM

## 2019-01-24 ENCOUNTER — Non-Acute Institutional Stay (SKILLED_NURSING_FACILITY): Payer: Medicare Other | Admitting: Internal Medicine

## 2019-01-24 ENCOUNTER — Encounter: Payer: Self-pay | Admitting: Internal Medicine

## 2019-01-24 DIAGNOSIS — I5022 Chronic systolic (congestive) heart failure: Secondary | ICD-10-CM

## 2019-01-24 DIAGNOSIS — R1310 Dysphagia, unspecified: Secondary | ICD-10-CM | POA: Diagnosis not present

## 2019-01-24 DIAGNOSIS — K7460 Unspecified cirrhosis of liver: Secondary | ICD-10-CM

## 2019-01-24 DIAGNOSIS — Z515 Encounter for palliative care: Secondary | ICD-10-CM

## 2019-01-24 DIAGNOSIS — E1149 Type 2 diabetes mellitus with other diabetic neurological complication: Secondary | ICD-10-CM

## 2019-01-24 DIAGNOSIS — R601 Generalized edema: Secondary | ICD-10-CM

## 2019-01-24 DIAGNOSIS — R627 Adult failure to thrive: Secondary | ICD-10-CM

## 2019-01-24 DIAGNOSIS — R339 Retention of urine, unspecified: Secondary | ICD-10-CM | POA: Insufficient documentation

## 2019-01-24 DIAGNOSIS — K7290 Hepatic failure, unspecified without coma: Secondary | ICD-10-CM

## 2019-01-24 DIAGNOSIS — N184 Chronic kidney disease, stage 4 (severe): Secondary | ICD-10-CM

## 2019-01-24 DIAGNOSIS — F1021 Alcohol dependence, in remission: Secondary | ICD-10-CM

## 2019-01-24 DIAGNOSIS — R569 Unspecified convulsions: Secondary | ICD-10-CM

## 2019-01-24 DIAGNOSIS — D696 Thrombocytopenia, unspecified: Secondary | ICD-10-CM

## 2019-01-24 DIAGNOSIS — Z978 Presence of other specified devices: Secondary | ICD-10-CM

## 2019-01-24 NOTE — Progress Notes (Signed)
Patient ID: Daniel [name], male   DOB: 12/09/1933, 85 y.o.   MRN: [id number]   Provider:  Rexene Edison. Vester Titsworth, D.O. Location:  [place] Room Number: 233-A Place of Service:  SNF (31)  PCP: Gayland Curry, DO Patient Care Team: Gayland Curry, DO as PCP - General (Geriatric Medicine) Croitoru, Dani Gobble, MD as PCP - Cardiology (Cardiology)  Extended Emergency Contact Information Primary Emergency Contact: Hufstedler,Ann Address: [address]          [place], [place] Phone: [phone] Mobile Phone: [phone] Relation: Spouse Secondary Emergency Contact: Tennyson, Wacha Mobile Phone: [phone] Relation: Son  Code Status: DNR  Goals of Care: Advanced Directive information Advanced Directives 01/24/2019  Does Patient Have a Medical Advance Directive? Yes  Type of Paramedic of [place];Out of facility DNR (pink MOST or yellow form)  Does patient want to make changes to medical advance directive? No - Patient declined  Copy of [place] in Chart? Yes - validated most recent copy scanned in chart (See row information)  Would patient like information on creating a medical advance directive? -  Pre-existing out of facility DNR order (yellow form or pink MOST form) Yellow form placed in chart (order not valid for inpatient use)      Chief Complaint  Patient presents with  . New Admit To SNF    New admission to [facility], [place]. Patient is currently under the care of Hospice as well    . Advanced Directive    Activate DNR Order    HPI: Patient is a 85 y.o. male seen today for admission to [facility] for hospice care.  He had been seeing Dr. Jani Gravel in the past as his PCP.  He has a complex medical history including cirrhosis of the liver from prior alcohol abuse, CHF with EF 45-50%, CKD3, DMII, CAD s/p stent placement,  seizures, neuropathy, prostate ca s/p radioactive seed implant, htn, hyperlipidemia.  Looking at his Epic chart, he's had three hospitalizations since July and was in rehab at [facility] before his last hospitalization from 01/10/19 to 01/19/19 with anasarca and asymptomatic left pleural effusion.  He also had acute on CKD3 with metabolic acidosis.  He was bradycardic and his troponin was elevated.  He had some difficulty with dysphagia noted.  While hospitalized this last time, his xanax was weaned after no signs of withdrawal.  Sugars were well-controlled without long-acting insulin so it was stopped and hew as on a sliding scale.  Foley was placed for urinary retention. His cognition and function have declined with failure to thrive over the past 4 mos. He did not tolerate increased diuresis well due to worsening renal function.  [place] was negative for hydronephrosis.  Paracentesis are to be done as needed for comfort and hospice was initiated.  He's had his official hospice intake visit.  He is here on a mechanical soft diet with thin liquids with specified eating/feeding instructions in the d/c summary.  Covid tests were negative 01/10/19 and 01/17/19.  When seen, he really did not have complaints.  He denied pain.  He reports resting well at night here.  He is not bothered by abdominal distension though he does have ascites.  He is not short of breath.  Bowels are moving.  He has a foley catheter in place with light yellow urine.  He admits his appetite is quite poor and his lunch was actually  brought during my visit and he declined to eat it.  He is drinking liquids.  He says that it's not that he's struggling to swallow the solids, he just does not feel like eating them.  Past Medical History:  Diagnosis Date  . Alcohol abuse   . Anasarca 01/13/2019  . Cancer [facility])    prostate  . Cardiomyopathy ([place])   . CHF (congestive heart failure) ([place])   . Complication of anesthesia    Hx of seizure when put  to sleep for surgery on 08/2009  . Constipation   . Coronary artery disease   . Diabetes mellitus without complication ([place])   . H/O hiatal hernia   . Hyperlipidemia   . Hypertension   . Peripheral neuropathy   . Seizures ([place])   . Urinary incontinence    Past Surgical History:  Procedure Laterality Date  . CATARACT EXTRACTION, BILATERAL    . CORONARY ANGIOPLASTY     stents x 2  . CORONARY STENT PLACEMENT     2004  . IR PARACENTESIS  01/12/2019  . PROSTATE SURGERY  2008   SEED IMPLANTS  . RADIOACTIVE SEED IMPLANT     prostate  . TONSILLECTOMY     1940's    reports that he has never smoked. He has never used smokeless tobacco. He reports previous alcohol use. He reports that he does not use drugs. Social History   Socioeconomic History  . Marital status: Married    Spouse name: Not on file  . Number of children: Not on file  . Years of education: Not on file  . Highest education level: Not on file  Occupational History  . Not on file  Social Needs  . Financial resource strain: Not on file  . Food insecurity    Worry: Not on file    Inability: Not on file  . Transportation needs    Medical: Not on file    Non-medical: Not on file  Tobacco Use  . Smoking status: Never Smoker  . Smokeless tobacco: Never Used  Substance and Sexual Activity  . Alcohol use: Not Currently    Comment: Was an alcoholic but hasn't drank in 10 years.  . Drug use: No  . Sexual activity: Not Currently  Lifestyle  . Physical activity    Days per week: Not on file    Minutes per session: Not on file  . Stress: Not on file  Relationships  . Social Herbalist on phone: Not on file    Gets together: Not on file    Attends religious service: Not on file    Active member of club or organization: Not on file    Attends meetings of clubs or organizations: Not on file    Relationship status: Not on file  . Intimate partner violence    Fear of current or ex partner: Not on file     Emotionally abused: Not on file    Physically abused: Not on file    Forced sexual activity: Not on file  Other Topics Concern  . Not on file  Social History Narrative   Diet:      Caffeine: Yes      Married, if yes what year: 1958      Do you live in a house, apartment, assisted living, condo, trailer, ect: House      Pets: None      Current/Past profession: Sales      Exercise: No  Living Will: YES   DNR: YES   POA/HPOA: YES      Functional Status:   Do you have difficulty bathing or dressing yourself?   Do you have difficulty preparing food or eating?   Do you have difficulty managing your medications?   Do you have difficulty managing your finances?   Do you have difficulty affording your medications?    Functional Status Survey:    Family History  Problem Relation Age of Onset  . Diabetes Mother   . Heart disease Mother   . Heart disease Father   . Heart disease Brother   . Diabetes Brother   . Cancer Sister     Health Maintenance  Topic Date Due  . FOOT EXAM  01/10/1946  . OPHTHALMOLOGY EXAM  01/10/1946  . URINE MICROALBUMIN  01/10/1946  . TETANUS/TDAP  01/11/1955  . PNA vac Low Risk Adult (1 of 2 - PCV13) 01/10/2001  . INFLUENZA VACCINE  10/22/2018  . HEMOGLOBIN A1C  04/15/2019    No Known Allergies  Outpatient Encounter Medications as of 01/24/2019  Medication Sig  . Amino Acids-Protein Hydrolys (FEEDING SUPPLEMENT, PRO-STAT SUGAR FREE 64,) LIQD Take 30 mLs by mouth 3 (three) times daily between meals.  . barrier cream (NON-SPECIFIED) CREA Apply 1 application topically 2 (two) times daily. Apply to buttocks  . finasteride (PROSCAR) 5 MG tablet Take 5 mg by mouth daily.   . folic acid (FOLVITE) 1 MG tablet Take 1 tablet (1 mg total) by mouth daily.  Marland Kitchen latanoprost (XALATAN) 0.005 % ophthalmic solution Place 1 drop into both eyes at bedtime.  . levETIRAcetam (KEPPRA) 500 MG tablet Take 1 tablet (500 mg total) by mouth 2 (two) times daily.    . Magnesium 250 MG TABS Take 250 mg by mouth daily.  . ondansetron (ZOFRAN) 4 MG tablet Take 4 mg by mouth every 6 (six) hours as needed for nausea or vomiting.  Marland Kitchen Petrolatum (SECURA PROTECTIVE) OINT Apply topically 2 (two) times daily. To bilateral lower extremities  . senna (SENOKOT) 8.6 MG TABS tablet Take 2 tablets by mouth at bedtime.  . sertraline (ZOLOFT) 50 MG tablet Take 50 mg by mouth daily.  . sodium bicarbonate 650 MG tablet Take 2 tablets (1,300 mg total) by mouth 2 (two) times daily.  . tamsulosin (FLOMAX) 0.4 MG CAPS capsule Take 1 capsule (0.4 mg total) by mouth daily.  Marland Kitchen thiamine 100 MG tablet Take 1 tablet (100 mg total) by mouth daily.  . vitamin C (ASCORBIC ACID) 500 MG tablet Take 500 mg by mouth daily.   . [DISCONTINUED] Skin Protectants, Misc. (BAZA PROTECT EX) Apply topically 2 (two) times daily. To buttocks   No facility-administered encounter medications on file as of 01/24/2019.     Review of Systems  Constitutional: Positive for activity change, appetite change and fatigue. Negative for chills and fever.       Wt up three lbs from discharge  HENT: Positive for trouble swallowing. Negative for congestion.   Eyes: Negative for visual disturbance.       Scleral icterus; wears glasses  Respiratory: Negative for cough, chest tightness and shortness of breath.   Cardiovascular: Positive for leg swelling. Negative for chest pain and palpitations.  Gastrointestinal: Negative for abdominal pain, constipation, diarrhea and nausea.  Musculoskeletal: Positive for gait problem. Negative for arthralgias.       Denies falls  Skin: Positive for pallor.       Slight jaundice; a few blisters that have scabbed over  on right leg    Vitals:   01/24/19 1011  BP: 116/85  Pulse: 81  Resp: 17  Temp: (!) 97 F (36.1 C)  SpO2: 97%  Weight: 193 lb (87.5 kg)  Height: 5' 8.5" (1.74 m)   Body mass index is 28.92 kg/m. Physical Exam Vitals signs and nursing note reviewed.   Constitutional:      General: He is not in acute distress.    Appearance: He is obese. He is ill-appearing. He is not toxic-appearing.  HENT:     Head: Normocephalic and atraumatic.     Right Ear: External ear normal.     Left Ear: External ear normal.     Nose: Nose normal.  Eyes:     General: Scleral icterus present.     Extraocular Movements: Extraocular movements intact.     Pupils: Pupils are equal, round, and reactive to light.     Comments: glasses  Cardiovascular:     Rate and Rhythm: Normal rate and regular rhythm.     Pulses: Normal pulses.     Heart sounds: Normal heart sounds.  Pulmonary:     Effort: Pulmonary effort is normal.     Comments: Diminished breath sounds at bases (challenging with isolation stethoscope) Abdominal:     General: Bowel sounds are normal. There is distension.     Palpations: Abdomen is soft. There is no mass.     Tenderness: There is no abdominal tenderness. There is no guarding or rebound.     Comments: Ascites present--not yet firm  Genitourinary:    Comments: Foley with pale yellow urine from leg bag Musculoskeletal: Normal range of motion.        General: Swelling present.     Right lower leg: Edema present.     Left lower leg: Edema present.  Lymphadenopathy:     Cervical: No cervical adenopathy.  Skin:    General: Skin is warm and dry.     Capillary Refill: Capillary refill takes less than 2 seconds.     Findings: Bruising present.     Comments: Slightly jaundice; some ecchymoses and scabbed over blisters on right lower leg  Neurological:     Mental Status: He is alert.     Cranial Nerves: No cranial nerve deficit.     Motor: Weakness present.     Gait: Gait abnormal.     Comments: Mild asterixis  Psychiatric:        Mood and Affect: Mood normal.     Comments: Says "I'm tired of this shit" in reference to the election on tv when I first entered     Labs reviewed: Basic Metabolic Panel: Recent Labs    01/13/19 0439  01/14/19 0448 01/15/19 0435 01/16/19 0523 01/17/19 0448 01/18/19 0340  NA 137 140 141 141 140 136  K 4.1 4.3 4.0 4.5 4.1 3.8  CL 108 111 111 112* 111 106  CO2 16* 18* 18* 14* 18* 19*  GLUCOSE 95 141* 114* 121* 139* 139*  BUN 84* 89* 94* 97* 99* 101*  CREATININE 3.90* 4.02* 4.03* 4.04* 3.73* 3.52*  CALCIUM 8.7* 8.7* 8.8* 8.6* 8.8* 8.4*  MG 2.3 2.4 2.3 2.3 2.3 2.3  PHOS 5.0* 4.9* 4.8*  --   --   --    Liver Function Tests: Recent Labs    01/16/19 0523 01/17/19 0448 01/18/19 0340  AST 102* 100* 105*  ALT 60* 66* 66*  ALKPHOS 285* 300* 326*  BILITOT 1.1 1.5* 1.6*  PROT 5.3*  5.6* 5.3*  ALBUMIN 2.3* 2.4* 2.4*   No results for input(s): LIPASE, AMYLASE in the last 8760 hours. Recent Labs    10/12/18 0222 01/15/19 1450  AMMONIA 29 17   CBC: Recent Labs    01/10/19 2132  01/16/19 0523 01/17/19 0448 01/18/19 0340  WBC 7.9   < > 8.7 8.5 9.2  NEUTROABS 4.5  --   --   --   --   HGB 12.4*   < > 11.4* 11.8* 11.2*  HCT 37.5*   < > 32.4* 35.0* 32.9*  MCV 99.5   < > 96.1 98.9 96.8  PLT 205   < > 106* 121* 116*   < > = values in this interval not displayed.   Cardiac Enzymes: No results for input(s): CKTOTAL, CKMB, CKMBINDEX, TROPONINI in the last 8760 hours. BNP: Invalid input(s): POCBNP Lab Results  Component Value Date   HGBA1C 6.6 (H) 10/13/2018   Lab Results  Component Value Date   TSH 2.240 01/11/2019   Lab Results  Component Value Date   VITAMINB12 1,089 (H) 10/12/2018   No results found for: FOLATE No results found for: IRON, TIBC, FERRITIN  Imaging and Procedures obtained prior to SNF admission: Dg Chest 2 View  Result Date: 01/11/2019 CLINICAL DATA:  Lower extremity edema. Volume overload. EXAM: CHEST - 2 VIEW COMPARISON:  Radiograph 10/12/2018 FINDINGS: Low lung volumes limit assessment. Cardiomegaly. Left pleural effusion is moderate in size, associated basilar airspace disease likely atelectasis. No pulmonary edema. Right lung is clear. No  pneumothorax. IMPRESSION: 1. Moderate left pleural effusion with associated basilar airspace disease, likely atelectasis. 2. Cardiomegaly without pulmonary edema. Electronically Signed   By: Keith Rake M.D.   On: 01/11/2019 01:49   US Renal  Result Date: 01/11/2019 CLINICAL DATA:  Initial evaluation for acute renal injury. EXAM: RENAL / URINARY TRACT ULTRASOUND COMPLETE COMPARISON:  Prior ultrasound from 04/17/2013 FINDINGS: Right Kidney: Renal measurements: 11.1 x 4.8 x 5.4 cm = volume: 150.6 mL . Echogenicity within normal limits. No mass or hydronephrosis visualized. Left Kidney: Renal measurements: 11.2 x 5.3 x 5.6 cm = volume: 173.1 mL. Echogenicity within normal limits. No nephrolithiasis or hydronephrosis. 1.4 cm simple exophytic cyst present at the upper pole. Additional 2.5 cm simple cyst present within the mid-upper kidney. Bladder: Appears normal for degree of bladder distention. Other: Moderate volume ascites seen within the partially visualized abdomen. Liver appears cirrhotic with overall small size and nodular contour. IMPRESSION: 1. No hydronephrosis or other acute finding. 2. Hepatic cirrhosis with moderate volume ascites. 3. Few small simple left renal cysts as above. Electronically Signed   By: Jeannine Boga M.D.   On: 01/11/2019 02:10   Assessment/Plan 1. Decompensated hepatic cirrhosis (HCC) -appears ascites is returning; he is drinking liquids but not eating well -may benefit from drain for ascites to avoid needing multiple paracenteses -does not have discomfort at present so monitor for pain and tense ascites -he is on hospice care  2. Anasarca -persists, renal function did not permit diuresis so may need repeat paracentesis or drain if he becomes uncomfortable -avoid daily weights due to comfort goals  3. H/O alcohol dependence ([place]) -no active drinking x 10 years  4. Dysphagia, unspecified type -to solids, doing better with liquids, po intake poor  5.  Chronic kidney disease, stage 4 (severe) (HCC) -Avoid nephrotoxic agents like nsaids, dose adjust renally excreted meds, hydrate.  6. Type II diabetes mellitus with neurological manifestations ([place]) -was taken off long-acting insulin and SSI  here -has SSI at hospital, but would avoid frequent CBGs here due to comfort goals  7. Seizure ([place]) -continues on keppra therapy  8. Thrombocytopenia ([place]) -due to cirrhosis, contributes to ecchymoses  9. Chronic systolic CHF (congestive heart failure) (HCC) -has also had pleural effusion  -no longer tolerating diuresis -avoid daily weights to maintain comfort  10. Failure to thrive in adult -has been progressive past few months with 3 hospital stays since July  11. Urinary retention -cont foley catheter with leg bag and secure positioning -has pale yellow urine today  12. Foley catheter in place - due to #11  13. Hospice care patient -has had hospice intake and hospice should be contacted first with concerns especially about his cirrhosis  Family/ staff Communication: discussed with SNF nurse  Labs/tests ordered: none due to goals of care being comfort-based  Beacher Every L. Leonela Kivi, D.O. [facility] [address], [place] [address] Cell Phone (Mon-Fri 8am-5pm):  [phone] On Call:  [phone] & follow prompts after 5pm & weekends Office Phone:  [phone] Office Fax:  [phone]

## 2019-01-30 ENCOUNTER — Non-Acute Institutional Stay (SKILLED_NURSING_FACILITY): Payer: Medicare Other | Admitting: Adult Health

## 2019-01-30 ENCOUNTER — Encounter: Payer: Self-pay | Admitting: Adult Health

## 2019-01-30 DIAGNOSIS — R601 Generalized edema: Secondary | ICD-10-CM

## 2019-01-30 DIAGNOSIS — K7031 Alcoholic cirrhosis of liver with ascites: Secondary | ICD-10-CM | POA: Diagnosis not present

## 2019-01-30 DIAGNOSIS — Z515 Encounter for palliative care: Secondary | ICD-10-CM

## 2019-01-30 NOTE — Progress Notes (Signed)
Location:  Occupational psychologist of Service:  SNF (31) Provider:   Cindi Carbon, ANP [place] [phone]   Gayland Curry, DO  Patient Care Team: Gayland Curry, DO as PCP - General (Geriatric Medicine) Sanda Klein, MD as PCP - Cardiology (Cardiology)  Extended Emergency Contact Information Primary Emergency Contact: Munyan,Ann Address: [address]          [place], [place] Phone: [phone] Mobile Phone: [phone] Relation: Spouse Secondary Emergency Contact: Aqil, [place] Mobile Phone: [phone] Relation: Son  Code Status:  DNR  Goals of care: Advanced Directive information Advanced Directives 01/24/2019  Does Patient Have a Medical Advance Directive? Yes  Type of [facility] of [place];Out of facility DNR (pink MOST or yellow form)  Does patient want to make changes to medical advance directive? No - Patient declined  Copy of [place] in Chart? Yes - validated most recent copy scanned in chart (See row information)  Would patient like information on creating a medical advance directive? -  Pre-existing out of facility DNR order (yellow form or pink MOST form) Yellow form placed in chart (order not valid for inpatient use)     Chief Complaint  Patient presents with   Acute Visit    increased abd girth    HPI:  Pt is a 85 y.o. male seen today for increase abd girth, lethargy, and decreased appetite.  He is admitted to skilled care long term after a hospitalization 10/20-10/29/20 due to anasarca secondary to decompensate cirrhosis with left pleural effusion, ascites, and thrombocytopenia, along with CKD, dementia, and FTT. He is followed by hospice. The nurse wrote an SBAR indicating that he looked uncomfortable and his abd looked bigger. He is also more confused and not eating. He is not able to verbalize symptoms. Vitals recorded in matrix were  WNL.   Past Medical History:  Diagnosis Date   Alcohol abuse    Anasarca 01/13/2019   Cancer ([place])    prostate   Cardiomyopathy ([place])    CHF (congestive heart failure) ([place])    Complication of anesthesia    Hx of seizure when put to sleep for surgery on 08/2009   Constipation    Coronary artery disease    Diabetes mellitus without complication ([place])    H/O hiatal hernia    Hyperlipidemia    Hypertension    Peripheral neuropathy    Seizures ([place])    Urinary incontinence    Past Surgical History:  Procedure Laterality Date   CATARACT EXTRACTION, BILATERAL     CORONARY ANGIOPLASTY     stents x 2   CORONARY STENT PLACEMENT     2004   IR PARACENTESIS  01/12/2019   PROSTATE SURGERY  2008   SEED IMPLANTS   RADIOACTIVE SEED IMPLANT     prostate   TONSILLECTOMY     1940's    No Known Allergies  Outpatient Encounter Medications as of 01/30/2019  Medication Sig   LORazepam (ATIVAN) 0.5 MG tablet Take 0.5 mg by mouth every 4 (four) hours as needed for anxiety.   morphine (ROXANOL) 20 MG/ML concentrated solution Take 5 mg by mouth every 4 (four) hours as needed for severe pain.   Amino Acids-Protein Hydrolys (FEEDING SUPPLEMENT, PRO-STAT SUGAR FREE 64,) LIQD Take 30 mLs by mouth 3 (three) times daily between meals.   barrier cream (NON-SPECIFIED) CREA Apply 1 application topically 2 (two) times daily. Apply to buttocks  finasteride (PROSCAR) 5 MG tablet Take 5 mg by mouth daily.    folic acid (FOLVITE) 1 MG tablet Take 1 tablet (1 mg total) by mouth daily.   latanoprost (XALATAN) 0.005 % ophthalmic solution Place 1 drop into both eyes at bedtime.   levETIRAcetam (KEPPRA) 500 MG tablet Take 1 tablet (500 mg total) by mouth 2 (two) times daily.   Magnesium 250 MG TABS Take 250 mg by mouth daily.   ondansetron (ZOFRAN) 4 MG tablet Take 4 mg by mouth every 6 (six) hours as needed for nausea or vomiting.   Petrolatum (SECURA PROTECTIVE) OINT Apply  topically 2 (two) times daily. To bilateral lower extremities   senna (SENOKOT) 8.6 MG TABS tablet Take 2 tablets by mouth at bedtime.   sertraline (ZOLOFT) 50 MG tablet Take 50 mg by mouth daily.   sodium bicarbonate 650 MG tablet Take 2 tablets (1,300 mg total) by mouth 2 (two) times daily.   tamsulosin (FLOMAX) 0.4 MG CAPS capsule Take 1 capsule (0.4 mg total) by mouth daily.   thiamine 100 MG tablet Take 1 tablet (100 mg total) by mouth daily.   vitamin C (ASCORBIC ACID) 500 MG tablet Take 500 mg by mouth daily.    No facility-administered encounter medications on file as of 01/30/2019.     Review of Systems  Unable to perform ROS: Dementia    Immunization History  Administered Date(s) Administered   Influenza, High Dose Seasonal PF 12/07/2016, 12/20/2017   Pertinent  Health Maintenance Due  Topic Date Due   FOOT EXAM  01/10/1944   OPHTHALMOLOGY EXAM  01/10/1944   URINE MICROALBUMIN  01/10/1944   PNA vac Low Risk Adult (1 of 2 - PCV13) 01/10/2001   INFLUENZA VACCINE  10/22/2018   HEMOGLOBIN A1C  04/15/2019   Fall Risk  01/24/2019  Risk for fall due to : History of fall(s);Impaired mobility;Impaired balance/gait;Mental status change;Medication side effect  Follow up Education provided;Falls evaluation completed;Falls prevention discussed  Comment all done at [facility]   Functional Status Survey:    Vitals:   01/30/19 1019  BP: 126/68  Pulse: 77  Resp: 18  Temp: 98 F (36.7 C)  SpO2: 95%   There is no height or weight on file to calculate BMI. Physical Exam Vitals signs and nursing note reviewed.  Constitutional:      General: He is not in acute distress.    Appearance: He is not diaphoretic.     Comments: Pale overweight male  HENT:     Head: Normocephalic and atraumatic.     Mouth/Throat:     Comments: Refused oral exam Eyes:     Conjunctiva/sclera: Conjunctivae normal.     Pupils: Pupils are equal, round, and reactive to light.  Neck:      Thyroid: No thyromegaly.     Vascular: No JVD.     Trachea: No tracheal deviation.  Cardiovascular:     Rate and Rhythm: Normal rate and regular rhythm.     Heart sounds: No murmur.  Pulmonary:     Effort: Pulmonary effort is normal. No respiratory distress.     Breath sounds: No wheezing.     Comments: Decreased bases and coarse breath sounds.  Abdominal:     General: There is distension.     Palpations: There is no mass.     Tenderness: There is no abdominal tenderness. There is no guarding or rebound.     Hernia: No hernia is present.     Comments: Rotund with  hypoactive bowel sounds x 4. Distended and slightly firm. Indention of stethoscope noted.   Musculoskeletal:     Right lower leg: Edema present.     Left lower leg: Edema present.  Lymphadenopathy:     Cervical: No cervical adenopathy.  Skin:    General: Skin is warm and dry.  Neurological:     Comments: Sleepy but arouses. Intermittently able to f/c.    Psychiatric:     Comments: Agitated and wanted me to leave him alone.      Labs reviewed: Recent Labs    01/13/19 0439 01/14/19 0448 01/15/19 0435 01/16/19 0523 01/17/19 0448 01/18/19 0340  NA 137 140 141 141 140 136  K 4.1 4.3 4.0 4.5 4.1 3.8  CL 108 111 111 112* 111 106  CO2 16* 18* 18* 14* 18* 19*  GLUCOSE 95 141* 114* 121* 139* 139*  BUN 84* 89* 94* 97* 99* 101*  CREATININE 3.90* 4.02* 4.03* 4.04* 3.73* 3.52*  CALCIUM 8.7* 8.7* 8.8* 8.6* 8.8* 8.4*  MG 2.3 2.4 2.3 2.3 2.3 2.3  PHOS 5.0* 4.9* 4.8*  --   --   --    Recent Labs    01/16/19 0523 01/17/19 0448 01/18/19 0340  AST 102* 100* 105*  ALT 60* 66* 66*  ALKPHOS 285* 300* 326*  BILITOT 1.1 1.5* 1.6*  PROT 5.3* 5.6* 5.3*  ALBUMIN 2.3* 2.4* 2.4*   Recent Labs    01/10/19 2132  01/16/19 0523 01/17/19 0448 01/18/19 0340  WBC 7.9   < > 8.7 8.5 9.2  NEUTROABS 4.5  --   --   --   --   HGB 12.4*   < > 11.4* 11.8* 11.2*  HCT 37.5*   < > 32.4* 35.0* 32.9*  MCV 99.5   < > 96.1 98.9 96.8  PLT  205   < > 106* 121* 116*   < > = values in this interval not displayed.   Lab Results  Component Value Date   TSH 2.240 01/11/2019   Lab Results  Component Value Date   HGBA1C 6.6 (H) 10/13/2018   Lab Results  Component Value Date   CHOL 153 10/13/2018   HDL 48 10/13/2018   LDLCALC 89 10/13/2018   TRIG 78 10/13/2018   CHOLHDL 3.2 10/13/2018    Significant Diagnostic Results in last 30 days:  Dg Chest 2 View  Result Date: 01/11/2019 CLINICAL DATA:  Lower extremity edema. Volume overload. EXAM: CHEST - 2 VIEW COMPARISON:  Radiograph 10/12/2018 FINDINGS: Low lung volumes limit assessment. Cardiomegaly. Left pleural effusion is moderate in size, associated basilar airspace disease likely atelectasis. No pulmonary edema. Right lung is clear. No pneumothorax. IMPRESSION: 1. Moderate left pleural effusion with associated basilar airspace disease, likely atelectasis. 2. Cardiomegaly without pulmonary edema. Electronically Signed   By: Keith Rake M.D.   On: 01/11/2019 01:49   US Renal  Result Date: 01/11/2019 CLINICAL DATA:  Initial evaluation for acute renal injury. EXAM: RENAL / URINARY TRACT ULTRASOUND COMPLETE COMPARISON:  Prior ultrasound from 04/17/2013 FINDINGS: Right Kidney: Renal measurements: 11.1 x 4.8 x 5.4 cm = volume: 150.6 mL . Echogenicity within normal limits. No mass or hydronephrosis visualized. Left Kidney: Renal measurements: 11.2 x 5.3 x 5.6 cm = volume: 173.1 mL. Echogenicity within normal limits. No nephrolithiasis or hydronephrosis. 1.4 cm simple exophytic cyst present at the upper pole. Additional 2.5 cm simple cyst present within the mid-upper kidney. Bladder: Appears normal for degree of bladder distention. Other: Moderate volume ascites seen within  the partially visualized abdomen. Liver appears cirrhotic with overall small size and nodular contour. IMPRESSION: 1. No hydronephrosis or other acute finding. 2. Hepatic cirrhosis with moderate volume ascites. 3. Few  small simple left renal cysts as above. Electronically Signed   By: Jeannine Boga M.D.   On: 01/11/2019 02:10   Ir Paracentesis  Result Date: 01/12/2019 INDICATION: Abdominal distention. Acute renal insufficiency. Ascites. Request for diagnostic and therapeutic paracentesis with max 2 L. EXAM: ULTRASOUND GUIDED LEFT LOWER QUADRANT PARACENTESIS MEDICATIONS: None. COMPLICATIONS: None immediate. PROCEDURE: Informed written consent was obtained from the patient after a discussion of the risks, benefits and alternatives to treatment. A timeout was performed prior to the initiation of the procedure. Initial ultrasound scanning demonstrates a large amount of ascites within the left lower abdominal quadrant. The left lower abdomen was prepped and draped in the usual sterile fashion. 1% lidocaine was used for local anesthesia. Following this, a 19 gauge, 7-cm, Yueh catheter was introduced. An ultrasound image was saved for documentation purposes. The paracentesis was performed. The catheter was removed and a dressing was applied. The patient tolerated the procedure well without immediate post procedural complication. FINDINGS: A total of approximately 2 L of clear yellow fluid was removed. Samples were sent to the laboratory as requested by the clinical team. IMPRESSION: Successful ultrasound-guided paracentesis yielding 2 liters of peritoneal fluid. Read by: Ascencion Dike PA-C Electronically Signed   By: Jerilynn Mages.  Shick M.D.   On: 01/12/2019 14:09    Assessment/Plan 1. Alcoholic cirrhosis of liver with ascites ([place]) He is likely experiencing increased distention due to worsening ascites. He is not in pain or discomfort at this time.   2. Anasarca Due to #1, no shortness of breath or increase leg swelling. Continue to monitor for s/s of discomfort. Avoid aggressive diuresis given his goals of care and CKD.  3. End of life care I spoke with his son Cung Masterson and we decided to avoid further treatment with  paracentesis. Mr. Donivin Wirt has declined in mentation and is no longer eating and nearing the end of life. Further treatment would be futile at this time. His son is in agreement with this plan. We will provide Roxanol and ativan for relief from pain and shortness of breath. Continue hospice care.     Family/ staff Communication: as above.   Labs/tests ordered: NA

## 2019-02-09 ENCOUNTER — Non-Acute Institutional Stay (SKILLED_NURSING_FACILITY): Payer: Medicare Other | Admitting: Adult Health

## 2019-02-09 ENCOUNTER — Encounter: Payer: Self-pay | Admitting: Adult Health

## 2019-02-09 DIAGNOSIS — K121 Other forms of stomatitis: Secondary | ICD-10-CM

## 2019-02-09 DIAGNOSIS — K7460 Unspecified cirrhosis of liver: Secondary | ICD-10-CM

## 2019-02-09 DIAGNOSIS — H209 Unspecified iridocyclitis: Secondary | ICD-10-CM | POA: Diagnosis not present

## 2019-02-09 DIAGNOSIS — R601 Generalized edema: Secondary | ICD-10-CM | POA: Diagnosis not present

## 2019-02-09 DIAGNOSIS — K7290 Hepatic failure, unspecified without coma: Secondary | ICD-10-CM | POA: Diagnosis not present

## 2019-02-09 DIAGNOSIS — R6889 Other general symptoms and signs: Secondary | ICD-10-CM | POA: Diagnosis not present

## 2019-02-09 MED ORDER — MORPHINE SULFATE (CONCENTRATE) 20 MG/ML PO SOLN: 5.0000 mg | Freq: Three times a day (TID) | ORAL | 0 refills | Status: AC

## 2019-02-09 NOTE — Progress Notes (Signed)
Location:  Occupational psychologist of Service:  SNF (31) Provider:   Cindi Carbon, ANP [place] [phone]   Gayland Curry, DO  Patient Care Team: Gayland Curry, DO as PCP - General (Geriatric Medicine) Sanda Klein, MD as PCP - Cardiology (Cardiology)  Extended Emergency Contact Information Primary Emergency Contact: Humann,Ann Address: [address]          [place], [place] Phone: [phone] Mobile Phone: [phone] Relation: Spouse Secondary Emergency Contact: Ke[place]on, [place] Mobile Phone: [phone] Relation: Son  Code Status:  DNR Goals of care: Advanced Directive information Advanced Directives 01/24/2019  Does Patient Have a Medical Advance Directive? Yes  Type of Paramedic of [place];Out of facility DNR (pink MOST or yellow form)  Does patient want to make changes to medical advance directive? No - Patient declined  Copy of [place] in Chart? Yes - validated most recent copy scanned in chart (See row information)  Would patient like information on creating a medical advance directive? -  Pre-existing out of facility DNR order (yellow form or pink MOST form) Yellow form placed in chart (order not valid for inpatient use)     Chief Complaint  Patient presents with   Acute Visit    secretions, eye redness, lip ulcer    HPI:  Pt is a 85 y.o. male seen today for an acute visit for the above complaints. He has a hx of alcoholic cirrhosis with ascites and is followed by hospice. On 11/9 he was seen and a decision was made to avoid any further paracentesis. Since that time his abd has become more distended. He has appeared uncomfortable at times and roxanol has been used for pain. He is not able to verbalize this morning but appears comfortable. He is making urine 150 cc over night. No resp distress. The nurse reports he is having excess secretions  and wants to try atropine drops. He is taking in very little, 1-2 bites at a meal.  He has some redness on his left eye noted yesterday and an ulcer in his mouth as well that is tender.    Past Medical History:  Diagnosis Date   Alcohol abuse    Anasarca 01/13/2019   Cancer ([place])    prostate   Cardiomyopathy ([place])    CHF (congestive heart failure) ([place])    Complication of anesthesia    Hx of seizure when put to sleep for surgery on 08/2009   Constipation    Coronary artery disease    Diabetes mellitus without complication ([place])    H/O hiatal hernia    Hyperlipidemia    Hypertension    Peripheral neuropathy    Seizures ([place])    Urinary incontinence    Past Surgical History:  Procedure Laterality Date   CATARACT EXTRACTION, BILATERAL     CORONARY ANGIOPLASTY     stents x 2   CORONARY STENT PLACEMENT     2004   IR PARACENTESIS  01/12/2019   PROSTATE SURGERY  2008   SEED IMPLANTS   RADIOACTIVE SEED IMPLANT     prostate   TONSILLECTOMY     1940's    No Known Allergies  Outpatient Encounter Medications as of 02/09/2019  Medication Sig   atropine 1 % ophthalmic solution Place 4 drops under the tongue 4 (four) times daily as needed.   morphine (ROXANOL) 20 MG/ML concentrated solution Take 0.25 mLs (5 mg total) by  mouth 3 (three) times daily.   [DISCONTINUED] morphine (ROXANOL) 20 MG/ML concentrated solution Take 5 mg by mouth 3 (three) times daily.   barrier cream (NON-SPECIFIED) CREA Apply 1 application topically 2 (two) times daily. Apply to buttocks   LORazepam (ATIVAN) 0.5 MG tablet Take 0.5 mg by mouth every 4 (four) hours as needed for anxiety.   morphine (ROXANOL) 20 MG/ML concentrated solution Take 5 mg by mouth every 4 (four) hours as needed for severe pain.   ondansetron (ZOFRAN) 4 MG tablet Take 4 mg by mouth every 6 (six) hours as needed for nausea or vomiting.   Petrolatum (SECURA PROTECTIVE) OINT Apply topically 2 (two) times  daily. To bilateral lower extremities   [DISCONTINUED] Amino Acids-Protein Hydrolys (FEEDING SUPPLEMENT, PRO-STAT SUGAR FREE 64,) LIQD Take 30 mLs by mouth 3 (three) times daily between meals.   [DISCONTINUED] finasteride (PROSCAR) 5 MG tablet Take 5 mg by mouth daily.    [DISCONTINUED] folic acid (FOLVITE) 1 MG tablet Take 1 tablet (1 mg total) by mouth daily.   [DISCONTINUED] latanoprost (XALATAN) 0.005 % ophthalmic solution Place 1 drop into both eyes at bedtime.   [DISCONTINUED] levETIRAcetam (KEPPRA) 500 MG tablet Take 1 tablet (500 mg total) by mouth 2 (two) times daily.   [DISCONTINUED] Magnesium 250 MG TABS Take 250 mg by mouth daily.   [DISCONTINUED] senna (SENOKOT) 8.6 MG TABS tablet Take 2 tablets by mouth at bedtime.   [DISCONTINUED] sertraline (ZOLOFT) 50 MG tablet Take 50 mg by mouth daily.   [DISCONTINUED] sodium bicarbonate 650 MG tablet Take 2 tablets (1,300 mg total) by mouth 2 (two) times daily.   [DISCONTINUED] tamsulosin (FLOMAX) 0.4 MG CAPS capsule Take 1 capsule (0.4 mg total) by mouth daily.   [DISCONTINUED] thiamine 100 MG tablet Take 1 tablet (100 mg total) by mouth daily.   [DISCONTINUED] vitamin C (ASCORBIC ACID) 500 MG tablet Take 500 mg by mouth daily.    No facility-administered encounter medications on file as of 02/09/2019.     Review of Systems  Unable to perform ROS: Dementia    Immunization History  Administered Date(s) Administered   Influenza, High Dose Seasonal PF 12/07/2016, 12/20/2017   Pertinent  Health Maintenance Due  Topic Date Due   FOOT EXAM  01/10/1944   OPHTHALMOLOGY EXAM  01/10/1944   URINE MICROALBUMIN  01/10/1944   PNA vac Low Risk Adult (1 of 2 - PCV13) 01/10/2001   INFLUENZA VACCINE  10/22/2018   HEMOGLOBIN A1C  04/15/2019   Fall Risk  01/24/2019  Risk for fall due to : History of fall(s);Impaired mobility;Impaired balance/gait;Mental status change;Medication side effect  Follow up Education provided;Falls  evaluation completed;Falls prevention discussed  Comment all done at [facility]:    There were no vitals filed for this visit. There is no height or weight on file to calculate BMI. Physical Exam Vitals signs and nursing note reviewed.  Constitutional:      General: He is not in acute distress.    Appearance: He is not diaphoretic.  HENT:     Head: Normocephalic and atraumatic.     Nose: Nose normal. No congestion.     Mouth/Throat:   Eyes:     General:        Right eye: No discharge or hordeolum.        Left eye: Discharge present.No hordeolum.     Conjunctiva/sclera:     Right eye: Right conjunctiva is not injected. No chemosis, exudate or hemorrhage.  Left eye: Left conjunctiva is injected. No chemosis, exudate or hemorrhage.    Pupils: Pupils are equal, round, and reactive to light.   Neck:     Thyroid: No thyromegaly.     Vascular: No JVD.     Trachea: No tracheal deviation.  Cardiovascular:     Rate and Rhythm: Normal rate and regular rhythm.     Heart sounds: No murmur.  Pulmonary:     Effort: Pulmonary effort is normal. No respiratory distress.     Breath sounds: Wheezing and rhonchi present.  Abdominal:     General: Bowel sounds are normal. There is distension.     Tenderness: There is abdominal tenderness.     Comments: Firm   Musculoskeletal:     Right lower leg: Edema present.     Left lower leg: Edema present.  Lymphadenopathy:     Cervical: No cervical adenopathy.  Skin:    General: Skin is warm and dry.  Neurological:     Mental Status: He is alert.     Comments: Not verbal and not able to f/c. Eyes opens, tracks      Labs reviewed: Recent Labs    01/13/19 0439 01/14/19 0448 01/15/19 0435 01/16/19 0523 01/17/19 0448 01/18/19 0340  NA 137 140 141 141 140 136  K 4.1 4.3 4.0 4.5 4.1 3.8  CL 108 111 111 112* 111 106  CO2 16* 18* 18* 14* 18* 19*  GLUCOSE 95 141* 114* 121* 139* 139*  BUN 84* 89* 94* 97* 99*  101*  CREATININE 3.90* 4.02* 4.03* 4.04* 3.73* 3.52*  CALCIUM 8.7* 8.7* 8.8* 8.6* 8.8* 8.4*  MG 2.3 2.4 2.3 2.3 2.3 2.3  PHOS 5.0* 4.9* 4.8*  --   --   --    Recent Labs    01/16/19 0523 01/17/19 0448 01/18/19 0340  AST 102* 100* 105*  ALT 60* 66* 66*  ALKPHOS 285* 300* 326*  BILITOT 1.1 1.5* 1.6*  PROT 5.3* 5.6* 5.3*  ALBUMIN 2.3* 2.4* 2.4*   Recent Labs    01/10/19 2132  01/16/19 0523 01/17/19 0448 01/18/19 0340  WBC 7.9   < > 8.7 8.5 9.2  NEUTROABS 4.5  --   --   --   --   HGB 12.4*   < > 11.4* 11.8* 11.2*  HCT 37.5*   < > 32.4* 35.0* 32.9*  MCV 99.5   < > 96.1 98.9 96.8  PLT 205   < > 106* 121* 116*   < > = values in this interval not displayed.   Lab Results  Component Value Date   TSH 2.240 01/11/2019   Lab Results  Component Value Date   HGBA1C 6.6 (H) 10/13/2018   Lab Results  Component Value Date   CHOL 153 10/13/2018   HDL 48 10/13/2018   LDLCALC 89 10/13/2018   TRIG 78 10/13/2018   CHOLHDL 3.2 10/13/2018    Significant Diagnostic Results in last 30 days:  Dg Chest 2 View  Result Date: 01/11/2019 CLINICAL DATA:  Lower extremity edema. Volume overload. EXAM: CHEST - 2 VIEW COMPARISON:  Radiograph 10/12/2018 FINDINGS: Low lung volumes limit assessment. Cardiomegaly. Left pleural effusion is moderate in size, associated basilar airspace disease likely atelectasis. No pulmonary edema. Right lung is clear. No pneumothorax. IMPRESSION: 1. Moderate left pleural effusion with associated basilar airspace disease, likely atelectasis. 2. Cardiomegaly without pulmonary edema. Electronically Signed   By: Keith Rake M.D.   On: 01/11/2019 01:49   US Renal  Result  Date: 01/11/2019 CLINICAL DATA:  Initial evaluation for acute renal injury. EXAM: RENAL / URINARY TRACT ULTRASOUND COMPLETE COMPARISON:  Prior ultrasound from 04/17/2013 FINDINGS: Right Kidney: Renal measurements: 11.1 x 4.8 x 5.4 cm = volume: 150.6 mL . Echogenicity within normal limits. No mass or  hydronephrosis visualized. Left Kidney: Renal measurements: 11.2 x 5.3 x 5.6 cm = volume: 173.1 mL. Echogenicity within normal limits. No nephrolithiasis or hydronephrosis. 1.4 cm simple exophytic cyst present at the upper pole. Additional 2.5 cm simple cyst present within the mid-upper kidney. Bladder: Appears normal for degree of bladder distention. Other: Moderate volume ascites seen within the partially visualized abdomen. Liver appears cirrhotic with overall small size and nodular contour. IMPRESSION: 1. No hydronephrosis or other acute finding. 2. Hepatic cirrhosis with moderate volume ascites. 3. Few small simple left renal cysts as above. Electronically Signed   By: Jeannine Boga M.D.   On: 01/11/2019 02:10   Ir Paracentesis  Result Date: 01/12/2019 INDICATION: Abdominal distention. Acute renal insufficiency. Ascites. Request for diagnostic and therapeutic paracentesis with max 2 L. EXAM: ULTRASOUND GUIDED LEFT LOWER QUADRANT PARACENTESIS MEDICATIONS: None. COMPLICATIONS: None immediate. PROCEDURE: Informed written consent was obtained from the patient after a discussion of the risks, benefits and alternatives to treatment. A timeout was performed prior to the initiation of the procedure. Initial ultrasound scanning demonstrates a large amount of ascites within the left lower abdominal quadrant. The left lower abdomen was prepped and draped in the usual sterile fashion. 1% lidocaine was used for local anesthesia. Following this, a 19 gauge, 7-cm, Yueh catheter was introduced. An ultrasound image was saved for documentation purposes. The paracentesis was performed. The catheter was removed and a dressing was applied. The patient tolerated the procedure well without immediate post procedural complication. FINDINGS: A total of approximately 2 L of clear yellow fluid was removed. Samples were sent to the laboratory as requested by the clinical team. IMPRESSION: Successful ultrasound-guided  paracentesis yielding 2 liters of peritoneal fluid. Read by: Ascencion Dike PA-C Electronically Signed   By: Jerilynn Mages.  Shick M.D.   On: 01/12/2019 14:09    Assessment/Plan 1. Excessive oral secretions Appears to be nearing the end of life, in fluid overload, and possible pneumonia. Goals of care are comfort based with no hospitalizations. Still alert, still making urine, and does not have cyanosis at this time.   Schedule morphine 5 mg tid and continue prn dosing D/C scopolamine patch and try atropine drops 4 orally qid prn secretions   2. Anasarca He appears more distended and more congested since my last visit due to this issue. His son [name] would like him to die peacefully and not prolong his life with any further treatment unless his comfort is affected.   3. Decompensated hepatic cirrhosis ([place]) Followed by hospice for this reason.   4. Uveitis of left eye This a potential dx given hypopyon found on exam.  He is nearing the end of life and is not bothered by this so no further work up or treatment is indicated. Staff and family to use gloves with care as usual.  5. Mouth ulcer Magic mouth wash 5 ml 4 x day x 1 week apply topically with a sponge.  Oral care regularly     Family/ staff Communication: discussed with his son [name]  Labs/tests ordered:  NA

## 2019-02-13 ENCOUNTER — Encounter: Payer: Self-pay | Admitting: Internal Medicine

## 2019-02-21 DEATH — deceased

## 2019-11-16 IMAGING — DX DG CHEST 1V PORT
1 series · 1 of 1 positions shown · non-contrast
Comparison: 06/16/2017

CLINICAL DATA: Hypotension. Fall. History of prostate cancer.
Nonsmoker.

EXAM:
PORTABLE CHEST 1 VIEW

[chest ap]
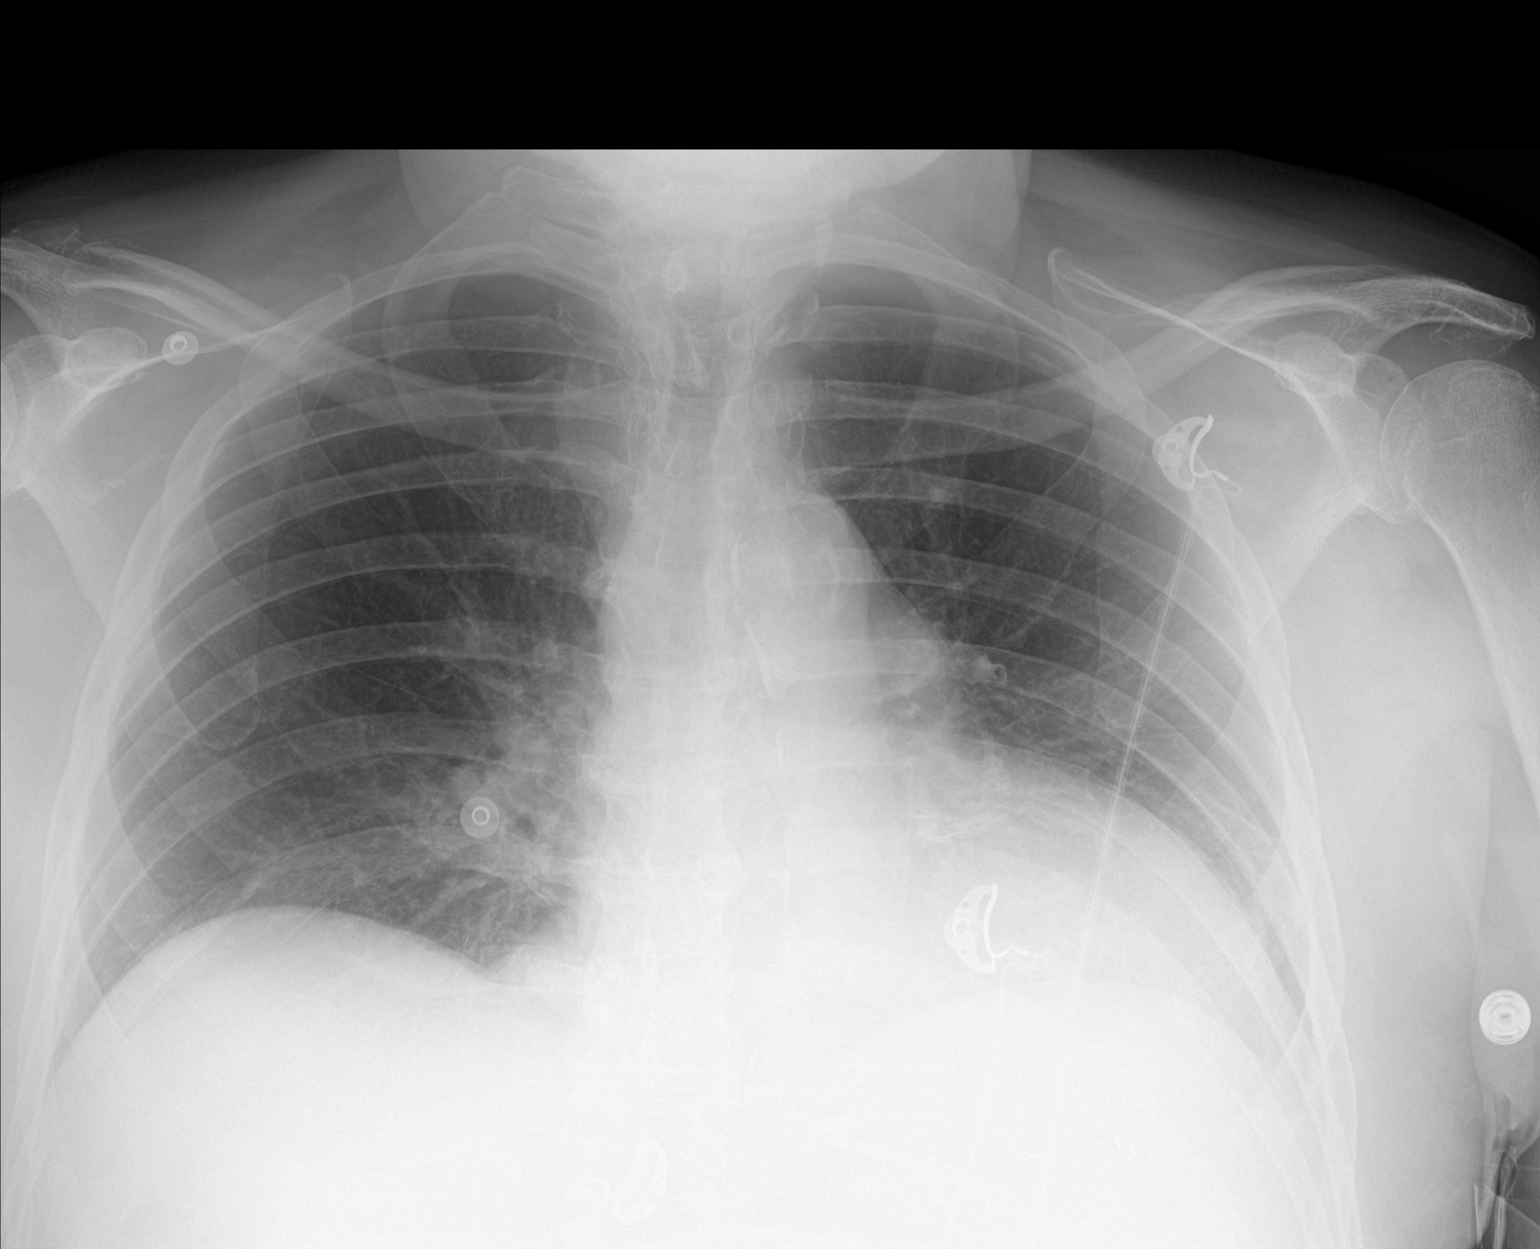

[1 of 1 positions shown; findings below may reference images not displayed]

FINDINGS: Interval removal of NG and endotracheal tubes. Shallow inspiration
with linear atelectasis in the lung bases. Cardiac enlargement
without vascular congestion. No edema or consolidation. No blunting
of costophrenic angles. No pneumothorax. Mediastinal contours appear
intact. Calcification of the aorta.
IMPRESSION: Shallow inspiration with atelectasis in the lung bases. Cardiac
enlargement. Aortic atherosclerosis.

## 2021-02-07 IMAGING — CT CT HEAD WITHOUT CONTRAST
3 series · 15 of 47 positions shown, 18 images · non-contrast
Comparison: Head CT 01/23/2018

CLINICAL DATA: Altered level of consciousness (LOC), unexplained.
Increasing lethargy and generalized weakness.

EXAM:
CT HEAD WITHOUT CONTRAST
TECHNIQUE: Contiguous axial images were obtained from the base of the skull
through the vertex without intravenous contrast.

[Series 3: head 5.0 h30s · axial · 0.43mm/px · z∈[-127,-2]mm · 9 of 31 slices shown, 12 images]
[im 3/31  brain]
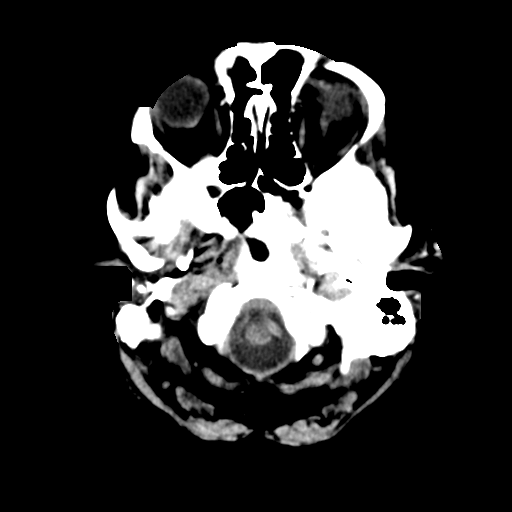
[im 3/31  bone]
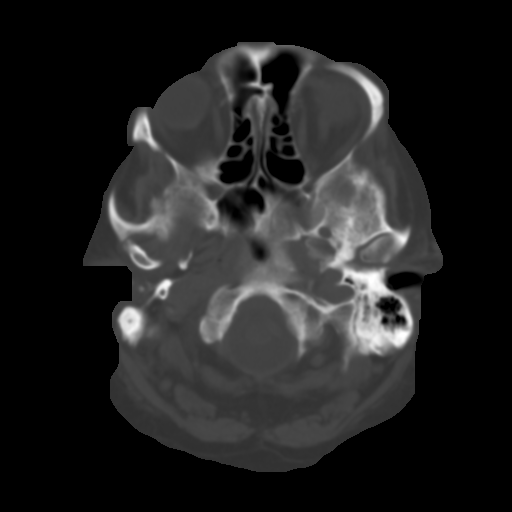
[im 6/31  brain]
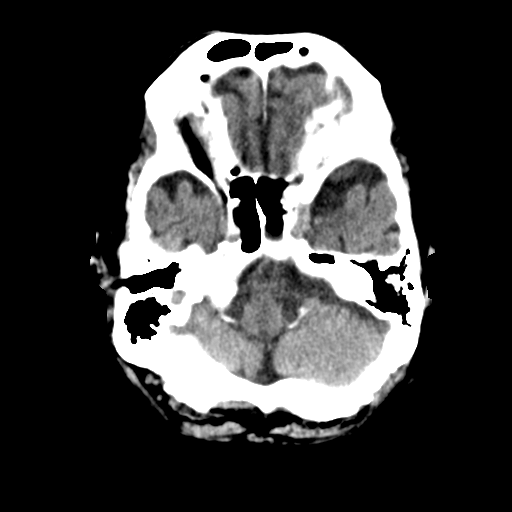
[im 9/31  brain]
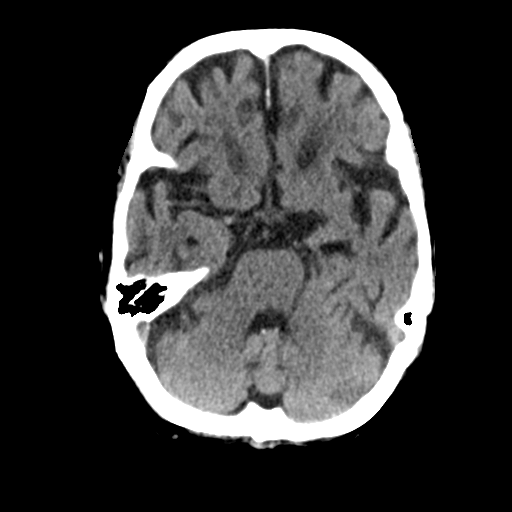
[im 12/31  brain]
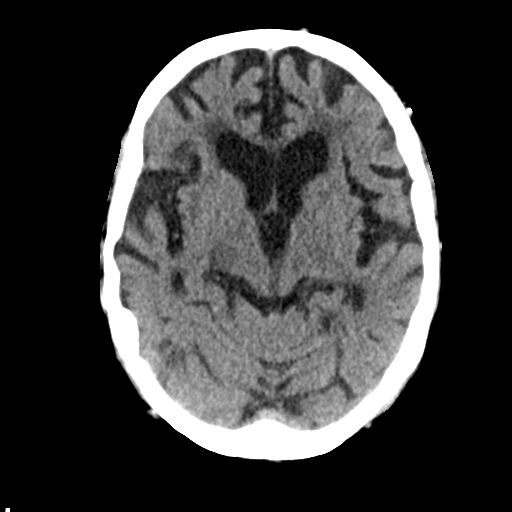
[im 16/31  brain]
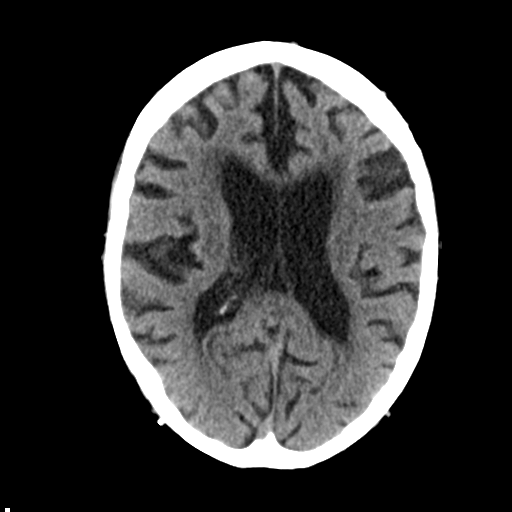
[im 16/31  bone]
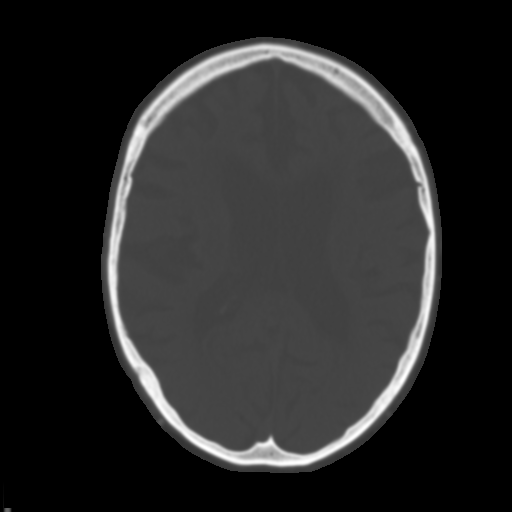
[im 19/31  brain]
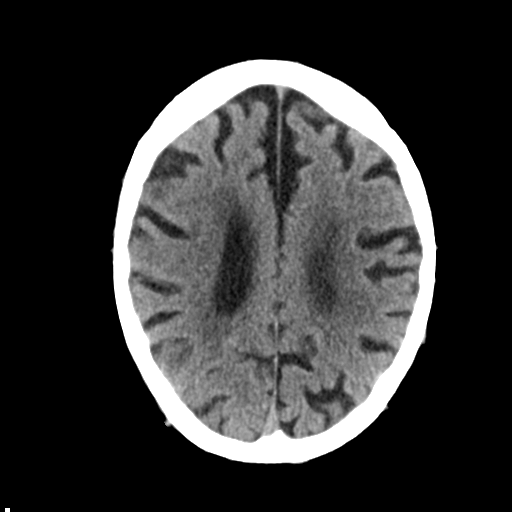
[im 22/31  brain]
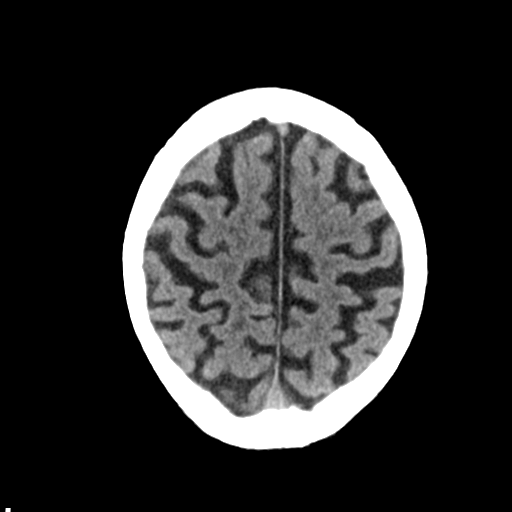
[im 25/31  brain]
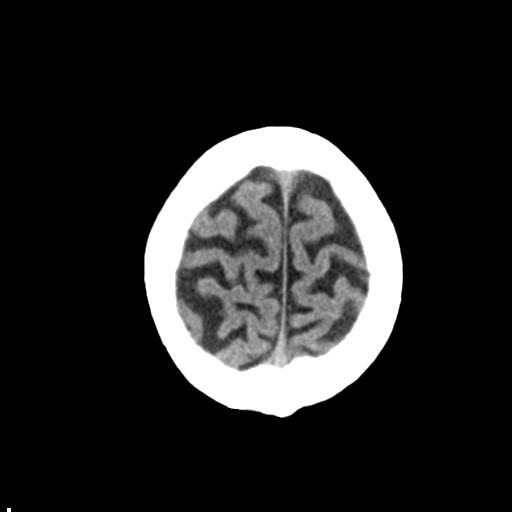
[im 28/31  brain]
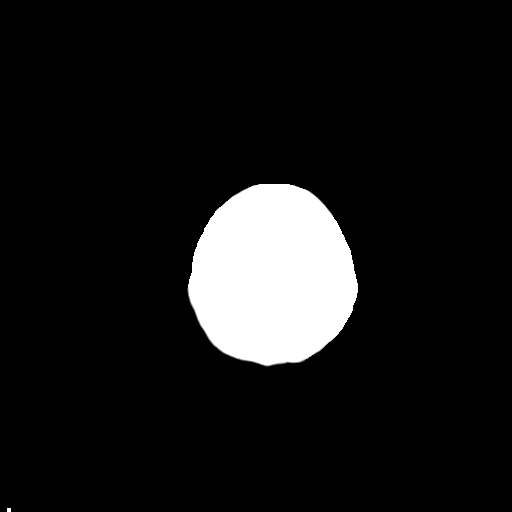
[im 28/31  bone]
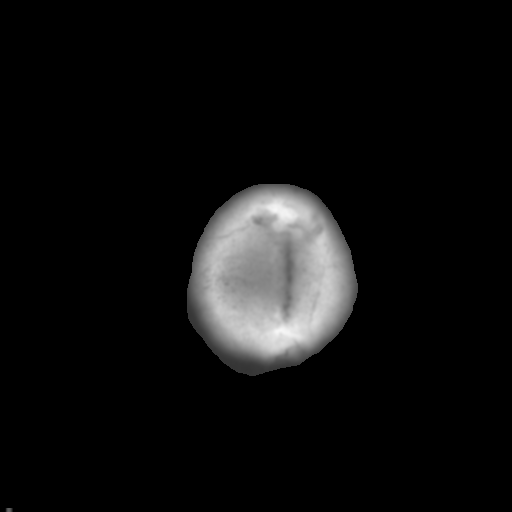

[Series 5: head 3.0 mpr cor · coronal · 0.30mm/px · 3 of 67 slices shown]
[im 23/67  brain]
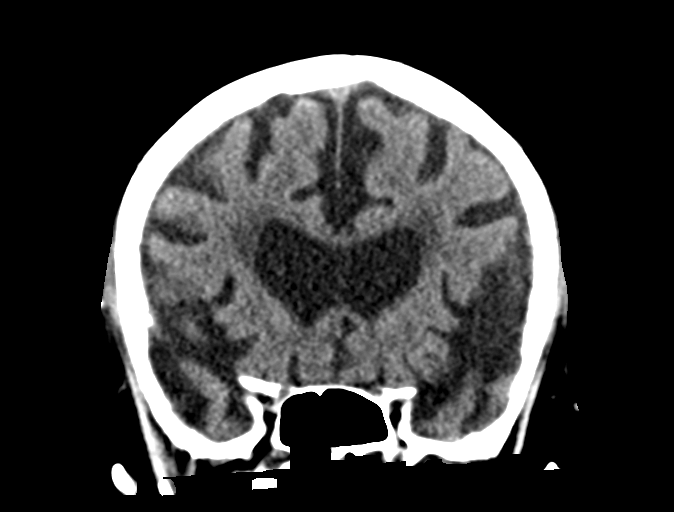
[im 30/67  brain]
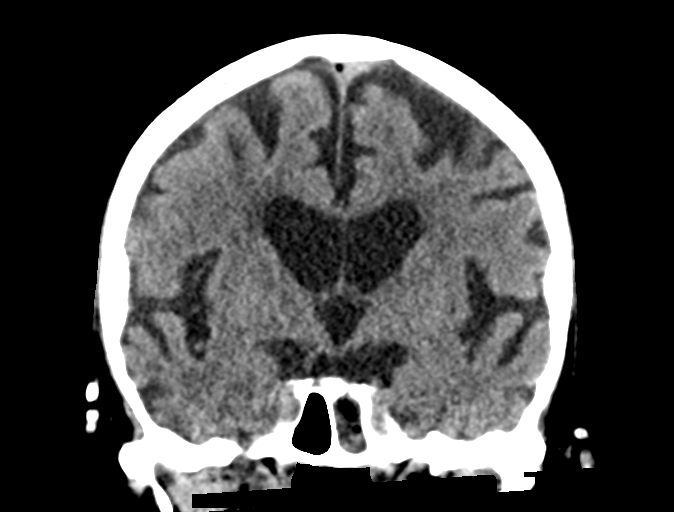
[im 37/67  brain]
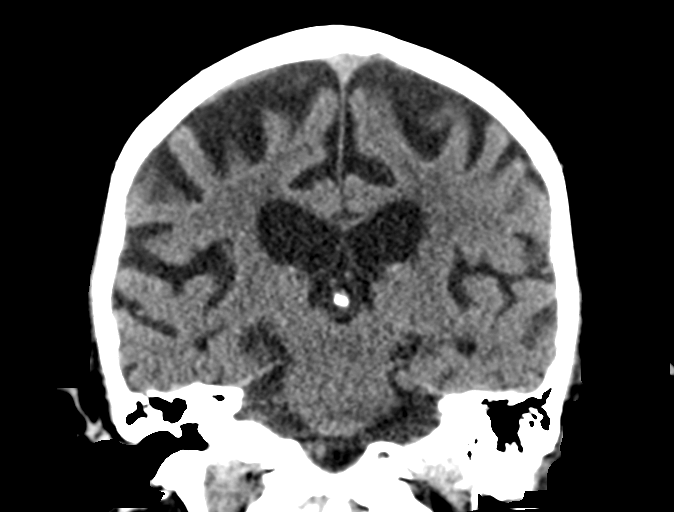

[Series 6: head 3.0 mpr sag · sagittal · 0.32mm/px · 3 of 67 slices shown]
[im 23/67  brain]
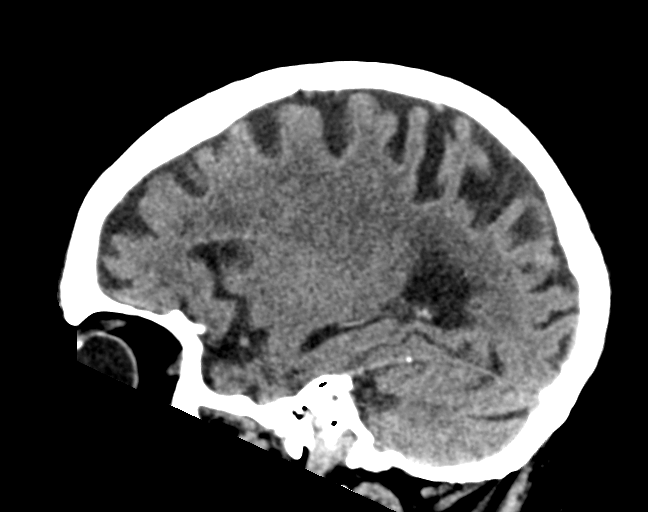
[im 34/67  brain]
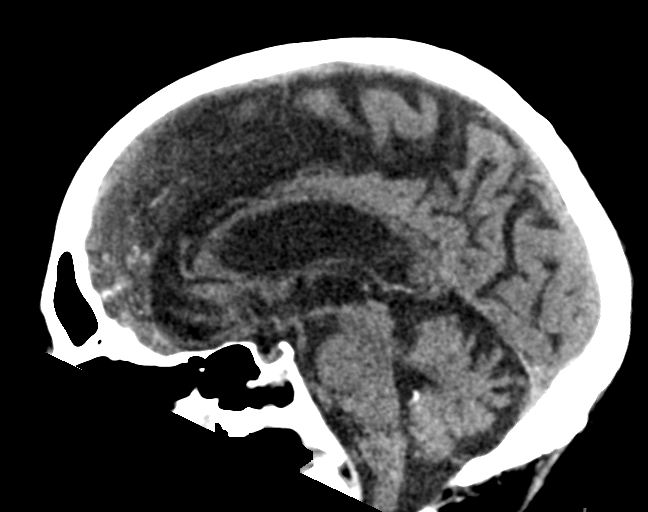
[im 45/67  brain]
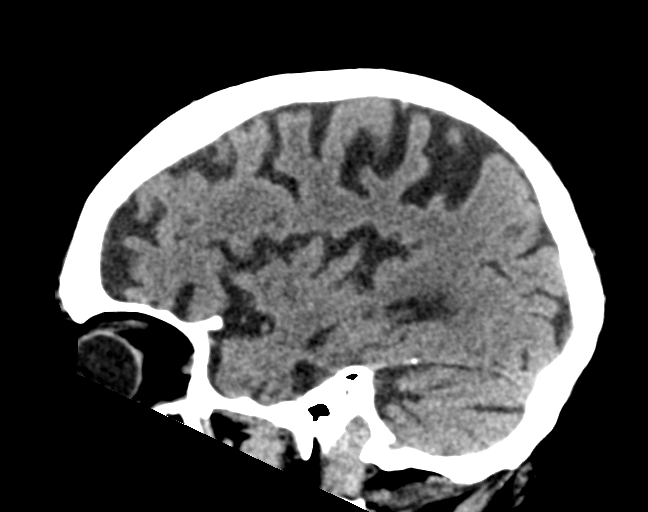

[15 of 47 positions shown; findings below may reference images not displayed]

FINDINGS: Brain: No evidence of acute infarction, hemorrhage, hydrocephalus,
extra-axial collection or mass lesion/mass effect. Advanced atrophy,
unchanged from prior exam. Moderate chronic small vessel ischemia.

Vascular: Atherosclerosis of skullbase vasculature without
hyperdense vessel or abnormal calcification.

Skull: No fracture or focal lesion.

Sinuses/Orbits: Chronic fluid level in mucosal thickening in left
side of sphenoid sinus. Chronic mucosal thickening of the right
maxillary sinus and ethmoid air cells.

Other: None.
IMPRESSION: No acute intracranial abnormality. Stable atrophy and chronic small
vessel ischemia.

## 2021-05-11 IMAGING — US IR PARACENTESIS
1 series · 2 of 2 positions shown · non-contrast
Comparison: none

INDICATION: Abdominal distention. Acute renal insufficiency. Ascites. Request
for diagnostic and therapeutic paracentesis with max 2 L.

[Series 1: ir (id) (id)/(id)/(id) ir · 2 of 2 slices shown]
[im 1/2]
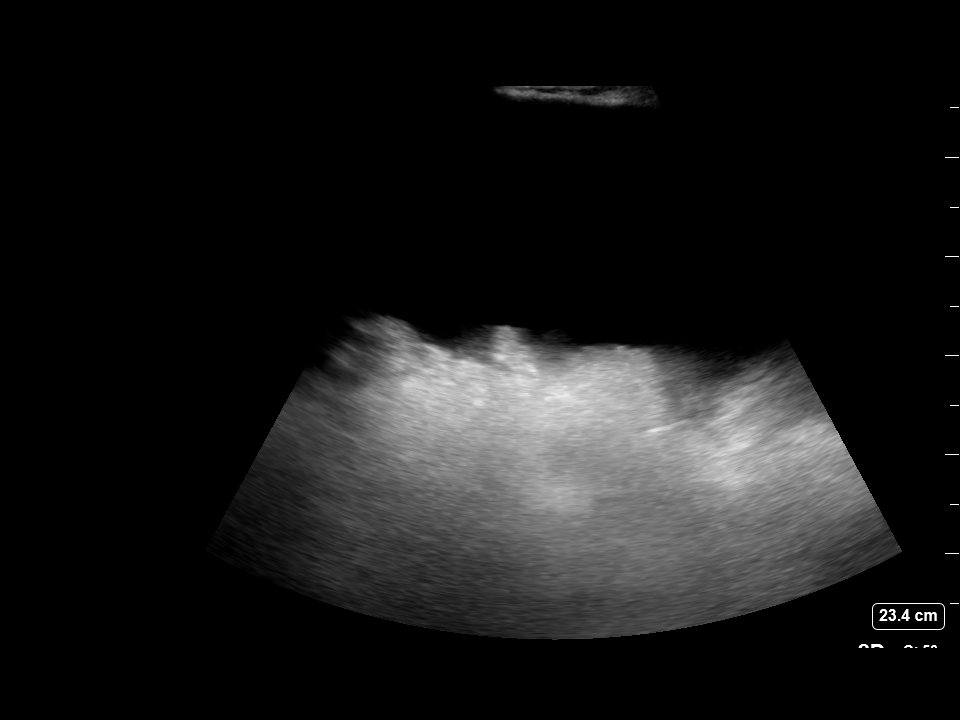
[im 2/2]
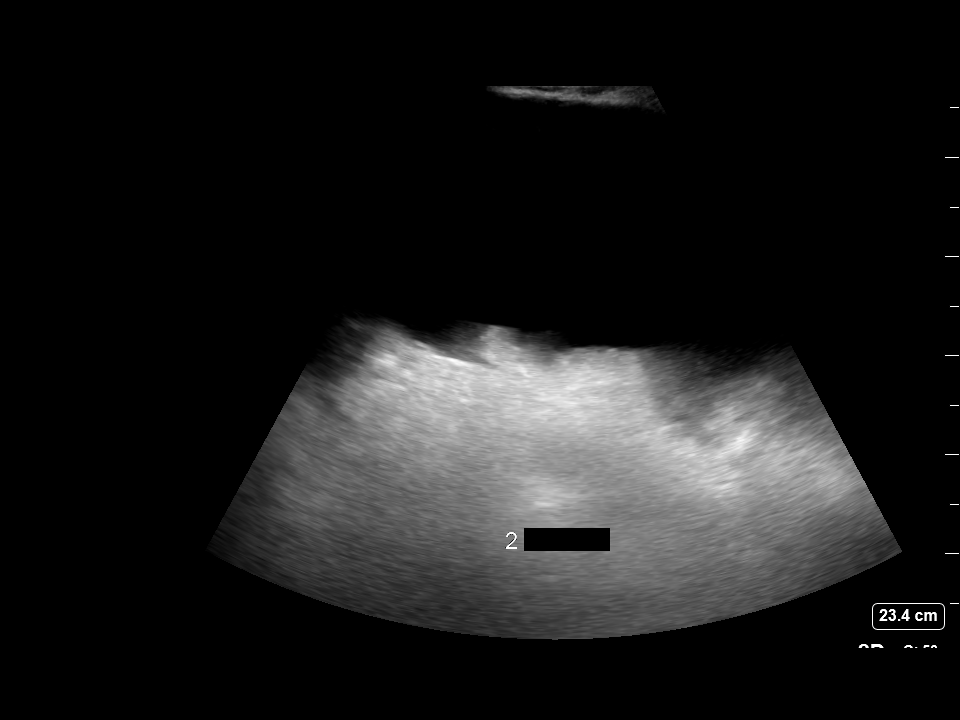

[2 of 2 positions shown; findings below may reference images not displayed]

EXAM:
ULTRASOUND GUIDED LEFT LOWER QUADRANT PARACENTESIS

MEDICATIONS:
None.

COMPLICATIONS:
None immediate.

PROCEDURE:
Informed written consent was obtained from the patient after a
discussion of the risks, benefits and alternatives to treatment. A
timeout was performed prior to the initiation of the procedure.

Initial ultrasound scanning demonstrates a large amount of ascites
within the left lower abdominal quadrant. The left lower abdomen was
prepped and draped in the usual sterile fashion. 1% lidocaine was
used for local anesthesia.

Following this, a 19 gauge, 7-cm, Yueh catheter was introduced. An
ultrasound image was saved for documentation purposes. The
paracentesis was performed. The catheter was removed and a dressing
was applied. The patient tolerated the procedure well without
immediate post procedural complication.
FINDINGS: A total of approximately 2 L of clear yellow fluid was removed.
Samples were sent to the laboratory as requested by the clinical
team.
IMPRESSION: Successful ultrasound-guided paracentesis yielding 2 liters of
peritoneal fluid.
